# Patient Record
Sex: Female | Born: 1965 | Race: White | Hispanic: No | Marital: Single | Smoking: Former smoker
Health system: Southern US, Community
[De-identification: ages and names within clinical notes are randomized; demographics above are authoritative.]

## PROBLEM LIST (undated history)

## (undated) DIAGNOSIS — I499 Cardiac arrhythmia, unspecified: Secondary | ICD-10-CM

## (undated) DIAGNOSIS — R0600 Dyspnea, unspecified: Secondary | ICD-10-CM

## (undated) DIAGNOSIS — R011 Cardiac murmur, unspecified: Secondary | ICD-10-CM

## (undated) DIAGNOSIS — F909 Attention-deficit hyperactivity disorder, unspecified type: Secondary | ICD-10-CM

## (undated) DIAGNOSIS — F329 Major depressive disorder, single episode, unspecified: Secondary | ICD-10-CM

## (undated) DIAGNOSIS — E079 Disorder of thyroid, unspecified: Secondary | ICD-10-CM

## (undated) DIAGNOSIS — F32A Depression, unspecified: Secondary | ICD-10-CM

## (undated) DIAGNOSIS — M722 Plantar fascial fibromatosis: Secondary | ICD-10-CM

## (undated) DIAGNOSIS — E039 Hypothyroidism, unspecified: Secondary | ICD-10-CM

## (undated) HISTORY — DX: Attention-deficit hyperactivity disorder, unspecified type: F90.9

## (undated) HISTORY — DX: Disorder of thyroid, unspecified: E07.9

## (undated) HISTORY — DX: Depression, unspecified: F32.A

## (undated) HISTORY — PX: COLON SURGERY: SHX602

## (undated) HISTORY — DX: Major depressive disorder, single episode, unspecified: F32.9
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
## (undated) NOTE — *Deleted (*Deleted)
Patient has been very uncomfortable this shift in sizewise bed and has expressed her desire for a new bed and new recliner. The bed was recently changed out as well as the recliner. Patient explained that at this time there are no other options. Patient given morphine twice for pain and zofran twice for nausea. Patient continues to refuse most meds and is teary at times. Frequent repositionings this shift. Chest tube changed to water seal per Maczis, PA at 1030 and then back to suction at 1600 per Dr. Zenaida Niece

---
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
## 2007-05-29 ENCOUNTER — Emergency Department: Payer: Self-pay | Admitting: General Practice

## 2008-07-26 ENCOUNTER — Emergency Department: Payer: Self-pay | Admitting: Emergency Medicine

## 2013-10-28 ENCOUNTER — Ambulatory Visit: Payer: Self-pay | Admitting: Nurse Practitioner

## 2015-12-21 ENCOUNTER — Other Ambulatory Visit: Payer: Self-pay | Admitting: Nurse Practitioner

## 2015-12-21 DIAGNOSIS — Z1231 Encounter for screening mammogram for malignant neoplasm of breast: Secondary | ICD-10-CM

## 2015-12-29 ENCOUNTER — Ambulatory Visit: Payer: Self-pay

## 2016-01-12 ENCOUNTER — Ambulatory Visit
Admission: RE | Admit: 2016-01-12 | Discharge: 2016-01-12 | Disposition: A | Discharge status: Home / Self Care | Payer: Medicaid Other | Source: Ambulatory Visit | Attending: Nurse Practitioner | Admitting: Nurse Practitioner

## 2016-01-12 DIAGNOSIS — Z1231 Encounter for screening mammogram for malignant neoplasm of breast: Secondary | ICD-10-CM | POA: Insufficient documentation

## 2016-01-16 ENCOUNTER — Other Ambulatory Visit: Payer: Self-pay | Admitting: Nurse Practitioner

## 2016-01-16 DIAGNOSIS — N63 Unspecified lump in unspecified breast: Secondary | ICD-10-CM

## 2016-01-30 ENCOUNTER — Other Ambulatory Visit: Payer: Medicaid Other

## 2016-01-30 ENCOUNTER — Ambulatory Visit: Payer: Medicaid Other

## 2016-02-09 ENCOUNTER — Ambulatory Visit
Admission: RE | Admit: 2016-02-09 | Discharge: 2016-02-09 | Disposition: A | Discharge status: Home / Self Care | Payer: Medicaid Other | Source: Ambulatory Visit | Attending: Nurse Practitioner | Admitting: Nurse Practitioner

## 2016-02-09 DIAGNOSIS — N63 Unspecified lump in unspecified breast: Secondary | ICD-10-CM

## 2016-02-09 DIAGNOSIS — R928 Other abnormal and inconclusive findings on diagnostic imaging of breast: Secondary | ICD-10-CM | POA: Insufficient documentation

## 2016-02-12 ENCOUNTER — Other Ambulatory Visit: Payer: Self-pay | Admitting: Nurse Practitioner

## 2016-02-12 DIAGNOSIS — N63 Unspecified lump in unspecified breast: Secondary | ICD-10-CM

## 2016-06-14 ENCOUNTER — Encounter: Payer: Self-pay | Admitting: Nurse Practitioner

## 2016-06-19 ENCOUNTER — Other Ambulatory Visit: Payer: Self-pay | Admitting: Ophthalmology

## 2016-06-19 DIAGNOSIS — G43109 Migraine with aura, not intractable, without status migrainosus: Secondary | ICD-10-CM

## 2016-07-10 ENCOUNTER — Ambulatory Visit: Admission: RE | Admit: 2016-07-10 | Payer: Medicaid Other | Source: Ambulatory Visit

## 2016-08-08 ENCOUNTER — Ambulatory Visit: Payer: Medicaid Other

## 2016-08-20 ENCOUNTER — Ambulatory Visit
Admission: RE | Admit: 2016-08-20 | Discharge: 2016-08-20 | Disposition: A | Discharge status: Home / Self Care | Payer: Medicaid Other | Source: Ambulatory Visit | Attending: Nurse Practitioner | Admitting: Nurse Practitioner

## 2016-08-20 DIAGNOSIS — N63 Unspecified lump in unspecified breast: Secondary | ICD-10-CM

## 2016-08-23 ENCOUNTER — Other Ambulatory Visit: Payer: Self-pay | Admitting: Nurse Practitioner

## 2016-08-23 DIAGNOSIS — N6310 Unspecified lump in the right breast, unspecified quadrant: Secondary | ICD-10-CM

## 2016-08-29 ENCOUNTER — Encounter: Payer: Self-pay | Admitting: Obstetrics and Gynecology

## 2016-09-11 ENCOUNTER — Other Ambulatory Visit: Payer: Medicaid Other

## 2016-09-11 ENCOUNTER — Ambulatory Visit: Payer: Medicaid Other

## 2016-11-12 ENCOUNTER — Encounter: Payer: Self-pay | Admitting: Obstetrics and Gynecology

## 2016-11-12 ENCOUNTER — Ambulatory Visit (INDEPENDENT_AMBULATORY_CARE_PROVIDER_SITE_OTHER): Payer: Medicaid Other | Admitting: Obstetrics and Gynecology

## 2016-11-12 VITALS — BP 118/77 | HR 78 | Ht 64.0 in | Wt 193.0 lb

## 2016-11-12 DIAGNOSIS — N926 Irregular menstruation, unspecified: Secondary | ICD-10-CM

## 2016-11-12 DIAGNOSIS — Z30433 Encounter for removal and reinsertion of intrauterine contraceptive device: Secondary | ICD-10-CM | POA: Diagnosis not present

## 2016-11-12 NOTE — Progress Notes (Signed)
[name] is a 50 y.o. year old 513P0 Caucasian female who presents for removal and replacement of a Mirena IUD. She was given informed consent for removal and reinsertion of her Mirena. Her Mirena was placed 5 years ago, No LMP recorded. Patient is not currently having periods (Reason: IUD)., and her pregnancy test today was NA.   The risks and benefits of the method and placement have been thouroughly reviewed with the patient and all questions were answered.  Specifically the patient is aware of failure rate of 12/998, expulsion of the IUD and of possible perforation.  The patient is aware of irregular bleeding due to the method and understands the incidence of irregular bleeding diminishes with time.  Signed copy of informed consent in chart.   No LMP recorded. Patient is not currently having periods (Reason: IUD). BP 118/77   Pulse 78   Ht 5\' 4"  (1.626 m)   Wt 193 lb (87.5 kg)   BMI 33.13 kg/m  No results found for this or any previous visit (from the past 24 hour(s)).   Appropriate time out taken. A graves speculum was placed in the vagina.  The cervix was visualized, prepped using Betadine. The strings were visible. They were grasped and the Mirena was easily removed. The cervix was then grasped with a single-tooth tenaculum. The uterus was found to be retroflexed and it sounded to 8 cm.  Mirena IUD placed per manufacturer's recommendations without complications. The strings were trimmed to 3 cm.  The patient tolerated the procedure well.  FSH levels obtained to rule out menopause  The patient was given post procedure instructions, including signs and symptoms of infection and to check for the strings after each menses or each month, and refraining from intercourse or anything in the vagina for 3 days.  She was given a Mirena care card with date Mirena placed, and date Mirena to be removed.    Melody Suzan NailerN Shambley, CNM

## 2016-11-12 NOTE — Patient Instructions (Signed)
Menopause Menopause is the normal time of life when menstrual periods stop completely. Menopause is complete when you have missed 12 consecutive menstrual periods. It usually occurs between the ages of 63 years and 78 years. Very rarely does a woman develop menopause before the age of 74 years. At menopause, your ovaries stop producing the female hormones estrogen and progesterone. This can cause undesirable symptoms and also affect your health. Sometimes the symptoms may occur 4-5 years before the menopause begins. There is no relationship between menopause and:  Oral contraceptives.  Number of children you had.  Race.  The age your menstrual periods started (menarche). Heavy smokers and very thin women may develop menopause earlier in life. What are the causes?  The ovaries stop producing the female hormones estrogen and progesterone. Other causes include:  Surgery to remove both ovaries.  The ovaries stop functioning for no known reason.  Tumors of the pituitary gland in the brain.  Medical disease that affects the ovaries and hormone production.  Radiation treatment to the abdomen or pelvis.  Chemotherapy that affects the ovaries. What are the signs or symptoms?  Hot flashes.  Night sweats.  Decrease in sex drive.  Vaginal dryness and thinning of the vagina causing painful intercourse.  Dryness of the skin and developing wrinkles.  Headaches.  Tiredness.  Irritability.  Memory problems.  Weight gain.  Bladder infections.  Hair growth of the face and chest.  Infertility. More serious symptoms include:  Loss of bone (osteoporosis) causing breaks (fractures).  Depression.  Hardening and narrowing of the arteries (atherosclerosis) causing heart attacks and strokes. How is this diagnosed?  When the menstrual periods have stopped for 12 straight months.  Physical exam.  Hormone studies of the blood. How is this treated? There are many treatment  choices and nearly as many questions about them. The decisions to treat or not to treat menopausal changes is an individual choice made with your health care provider. Your health care provider can discuss the treatments with you. Together, you can decide which treatment will work best for you. Your treatment choices may include:  Hormone therapy (estrogen and progesterone).  Non-hormonal medicines.  Treating the individual symptoms with medicine (for example antidepressants for depression).  Herbal medicines that may help specific symptoms.  Counseling by a psychiatrist or psychologist.  Group therapy.  Lifestyle changes including:  Eating healthy.  Regular exercise.  Limiting caffeine and alcohol.  Stress management and meditation.  No treatment. Follow these instructions at home:  Take the medicine your health care provider gives you as directed.  Get plenty of sleep and rest.  Exercise regularly.  Eat a diet that contains calcium (good for the bones) and soy products (acts like estrogen hormone).  Avoid alcoholic beverages.  Do not smoke.  If you have hot flashes, dress in layers.  Take supplements, calcium, and vitamin D to strengthen bones.  You can use over-the-counter lubricants or moisturizers for vaginal dryness.  Group therapy is sometimes very helpful.  Acupuncture may be helpful in some cases. Contact a health care provider if:  You are not sure you are in menopause.  You are having menopausal symptoms and need advice and treatment.  You are still having menstrual periods after age 32 years.  You have pain with intercourse.  Menopause is complete (no menstrual period for 12 months) and you develop vaginal bleeding.  You need a referral to a specialist (gynecologist, psychiatrist, or psychologist) for treatment. Get help right away if:  You  have severe depression.  You have excessive vaginal bleeding.  You fell and think you have a broken  bone.  You have pain when you urinate.  You develop leg or chest pain.  You have a fast pounding heart beat (palpitations).  You have severe headaches.  You develop vision problems.  You feel a lump in your breast.  You have abdominal pain or severe indigestion. This information is not intended to replace advice given to you by your health care provider. Make sure you discuss any questions you have with your health care provider. Document Released: 03/07/2004 Document Revised: 05/23/2016 Document Reviewed: 07/15/2013 Elsevier Interactive Patient Education  2017 [employer] IUD PLACEMENT POST-PROCEDURE INSTRUCTIONS  1. You may take Ibuprofen, Aleve or Tylenol for pain if needed.  Cramping should resolve within in 24 hours.  2. You may have a small amount of spotting.  You should wear a mini pad for the next few days.  3. You may have intercourse after 24 hours.  If you using this for birth control, it is effective immediately.  4. You need to call if you have any pelvic pain, fever, heavy bleeding or foul smelling vaginal discharge.  Irregular bleeding is common the first several months after having an IUD placed. You do not need to call for this reason unless you are concerned.  5. Shower or bathe as normal  6. You should have a follow-up appointment in 4-8 weeks for a re-check to make sure you are not having any problems.

## 2016-11-13 LAB — FOLLICLE STIMULATING HORMONE: FSH: 25.4 m[IU]/mL

## 2016-11-14 ENCOUNTER — Telehealth: Payer: Self-pay | Admitting: *Deleted

## 2016-11-14 NOTE — Telephone Encounter (Signed)
-----   Message from Purcell NailsMelody N Shambley, [place] sent at 11/13/2016  1:16 PM EST ----- Please let her know she is almost in menopause, but not yet. I would repeat her lab in a year.

## 2016-11-14 NOTE — Telephone Encounter (Signed)
Notified pt of results 

## 2016-12-26 ENCOUNTER — Encounter: Payer: Medicaid Other | Admitting: Obstetrics and Gynecology

## 2017-01-03 ENCOUNTER — Ambulatory Visit (INDEPENDENT_AMBULATORY_CARE_PROVIDER_SITE_OTHER): Payer: Medicaid Other | Admitting: Obstetrics and Gynecology

## 2017-01-03 ENCOUNTER — Encounter: Payer: Medicaid Other | Admitting: Obstetrics and Gynecology

## 2017-01-03 ENCOUNTER — Encounter: Payer: Self-pay | Admitting: Obstetrics and Gynecology

## 2017-01-03 VITALS — BP 99/58 | HR 70 | Ht 64.0 in | Wt 191.9 lb

## 2017-01-03 DIAGNOSIS — B373 Candidiasis of vulva and vagina: Secondary | ICD-10-CM

## 2017-01-03 DIAGNOSIS — R29898 Other symptoms and signs involving the musculoskeletal system: Secondary | ICD-10-CM | POA: Diagnosis not present

## 2017-01-03 DIAGNOSIS — B3731 Acute candidiasis of vulva and vagina: Secondary | ICD-10-CM

## 2017-01-03 DIAGNOSIS — Z30431 Encounter for routine checking of intrauterine contraceptive device: Secondary | ICD-10-CM | POA: Diagnosis not present

## 2017-01-03 DIAGNOSIS — L608 Other nail disorders: Secondary | ICD-10-CM

## 2017-01-03 MED ORDER — TERCONAZOLE 0.4 % VA CREA: 1.0000 | TOPICAL_CREAM | Freq: Every day | VAGINAL | 0 refills | Status: DC

## 2017-01-04 LAB — BASIC METABOLIC PANEL
BUN / CREAT RATIO: 21 (ref 9–23)
BUN: 16 mg/dL (ref 6–24)
CHLORIDE: 104 mmol/L (ref 96–106)
CO2: 24 mmol/L (ref 18–29)
Calcium: 9.1 mg/dL (ref 8.7–10.2)
Creatinine, Ser: 0.76 mg/dL (ref 0.57–1.00)
GFR calc Af Amer: 107 mL/min/{1.73_m2} (ref >59)
GFR calc non Af Amer: 93 mL/min/{1.73_m2} (ref >59)
GLUCOSE: 84 mg/dL (ref 65–99)
Potassium: 4 mmol/L (ref 3.5–5.2)
SODIUM: 144 mmol/L (ref 134–144)

## 2017-01-04 LAB — VITAMIN B12: VITAMIN B 12: 789 pg/mL (ref 232–1245)

## 2017-01-04 LAB — VITAMIN D 25 HYDROXY (VIT D DEFICIENCY, FRACTURES): Vit D, 25-Hydroxy: 51.1 ng/mL (ref 30.0–100.0)

## 2017-02-06 NOTE — Progress Notes (Signed)
Subjective:     Patient ID: Sara [name], female   DOB: December 15, 1966, 51 y.o.   MRN: [id number]  HPI Here for IUD check, replaced Mirena on 11/12/16. Reports irregular spotting since insertion, but more concerned with vaginal itching and irritation with increased white d/c  also concerned as nails are thinner and fragile. Review of Systems Negative except stated in HPI    Objective:   Physical Exam A&O x4 Well groomed female in no distress Blood pressure (!) 99/58, pulse 70, height 5\' 4"  (1.626 m), weight 191 lb 14.4 oz (87 kg). Pelvic exam: normal external genitalia, vulva, vagina, cervix, uterus and adnexa, WET MOUNT done - results: negative for pathogens, normal epithelial cells, lactobacilli, IUD string noted. Increased white discharge. Nails appear normal     Assessment:     IUD check Yeast infection     Plan:    labs obtained. meds sent in to treat yeast infection Desires to continue with IUD use. RTC as needed.  Zilla Shartzer [place] Lake[place], CNM

## 2017-02-19 ENCOUNTER — Ambulatory Visit
Admission: RE | Admit: 2017-02-19 | Discharge: 2017-02-19 | Disposition: A | Discharge status: Home / Self Care | Payer: Medicaid Other | Source: Ambulatory Visit | Attending: Nurse Practitioner | Admitting: Nurse Practitioner

## 2017-02-19 DIAGNOSIS — N6310 Unspecified lump in the right breast, unspecified quadrant: Secondary | ICD-10-CM | POA: Diagnosis present

## 2017-12-24 ENCOUNTER — Other Ambulatory Visit: Payer: Self-pay | Admitting: Nurse Practitioner

## 2017-12-24 DIAGNOSIS — R922 Inconclusive mammogram: Secondary | ICD-10-CM

## 2018-02-20 ENCOUNTER — Other Ambulatory Visit: Payer: Self-pay

## 2020-02-24 ENCOUNTER — Other Ambulatory Visit: Payer: Self-pay

## 2020-02-24 ENCOUNTER — Ambulatory Visit: Payer: Medicaid Other | Admitting: Podiatry

## 2020-02-24 DIAGNOSIS — M7751 Other enthesopathy of right foot: Secondary | ICD-10-CM | POA: Diagnosis not present

## 2020-02-24 DIAGNOSIS — M778 Other enthesopathies, not elsewhere classified: Secondary | ICD-10-CM

## 2020-02-24 DIAGNOSIS — M7752 Other enthesopathy of left foot: Secondary | ICD-10-CM

## 2020-02-24 DIAGNOSIS — L600 Ingrowing nail: Secondary | ICD-10-CM

## 2020-02-29 ENCOUNTER — Encounter: Payer: Self-pay | Admitting: Podiatry

## 2020-02-29 NOTE — Progress Notes (Signed)
Subjective:  Patient ID: Sara [name], female    DOB: 19-Oct-1966,  MRN: [id number]  Chief Complaint  Patient presents with  . Callouses    pt is here for bil callous pain, primarily located on the plantar forefoot of both feet, as well as the lateral side of both feet.     54 y.o. female presents with the above complaint.  Patient presents with bilateral lateral ingrown of the hallux that has been causing her a lot of pain.  Patient states that it is painful to ambulate on.  Patient states that the pain is worsened when she is ambulating.  There is excessive pressure on the dorsal aspect of the toes that may have caused it to become ingrown.  She would like to know if this could be removed.  She also has secondary complaint of bilateral porokeratosis/benign skin lesion.  She states that this has also been causing her a lot of pain especially when ambulating.  She denies any other acute complaints.   Review of Systems: Negative except as noted in the HPI. Denies N/V/F/Ch.  Past Medical History:  Diagnosis Date  . ADHD   . Depression   . Thyroid disease     Current Outpatient Medications:  .  atomoxetine (STRATTERA) 100 MG capsule, Take 100 mg by mouth daily., Disp: , Rfl:  .  Biotin 10 MG CAPS, Take by mouth., Disp: , Rfl:  .  levothyroxine (SYNTHROID, LEVOTHROID) 75 MCG tablet, Take 75 mcg by mouth daily before breakfast., Disp: , Rfl:  .  terconazole (TERAZOL 7) 0.4 % vaginal cream, Place 1 applicator vaginally at bedtime., Disp: 45 g, Rfl: 0 .  Vitamin D, Ergocalciferol, (DRISDOL) 50000 units CAPS capsule, Take 50,000 Units by mouth every 7 (seven) days., Disp: , Rfl:  .  vortioxetine HBr (TRINTELLIX) 10 MG TABS, Take by mouth., Disp: , Rfl:   Social History   Tobacco Use  Smoking Status Never Smoker  Smokeless Tobacco Never Used    Allergies  Allergen Reactions  . Augmentin [Amoxicillin-Pot Clavulanate]   . Ciprofloxacin   . Neosporin [Neomycin-Bacitracin Zn-Polymyx]     . Septra [Sulfamethoxazole-Trimethoprim]   . Zithromax [Azithromycin]    Objective:  There were no vitals filed for this visit. There is no height or weight on file to calculate BMI. Constitutional Well developed. Well nourished.  Vascular Dorsalis pedis pulses palpable bilaterally. Posterior tibial pulses palpable bilaterally. Capillary refill normal to all digits.  No cyanosis or clubbing noted. Pedal hair growth normal.  Neurologic Normal speech. Oriented to person, place, and time. Epicritic sensation to light touch grossly present bilaterally.  Dermatologic Painful ingrowing nail at lateral nail borders of the hallux nail bilaterally. No other open wounds. Benign skin lotion noted on the plantar aspect of fifth submetatarsal.  Pain on palpation noted.  Upon debridement no pinpoint bleeding noted.  Orthopedic: Normal joint ROM without pain or crepitus bilaterally. No visible deformities. No bony tenderness.   Radiographs: None Assessment:   1. Ingrown toenail of right foot   2. Ingrown left big toenail   3. Capsulitis of foot, right   4. Capsulitis of left foot    Plan:  Patient was evaluated and treated and all questions answered.  Ingrown Nail, bilaterally -Patient elects to proceed with minor surgery to remove ingrown toenail removal today. Consent reviewed and signed by patient. -Ingrown nail excised. See procedure note. -Educated on post-procedure care including soaking. Written instructions provided and reviewed. -Patient to follow up in 4 weeks  for nail check.   Bilateral submetatarsal 5 benign skin lesion/capsulitis -I explained to the patient the etiology of skin lesion and various treatment options associated with it.  I believe patient will benefit from a steroid injection to help address the acute inflammation due to excessive pressure.  Patient agrees with the plan would like to proceed with a steroid injection to help decrease the acute inflammatory  portion. -A steroid injection was performed at bilateral submetatarsal 5 using 1% plain Lidocaine and 10 mg of Kenalog. This was well tolerated.   Procedure: Excision of Ingrown Toenail Location: Bilateral 1st toe medial nail borders. Anesthesia: Lidocaine 1% plain; 1.5 mL and Marcaine 0.5% plain; 1.5 mL, digital block. Skin Prep: Betadine. Dressing: Silvadene; telfa; dry, sterile, compression dressing. Technique: Following skin prep, the toe was exsanguinated and a tourniquet was secured at the base of the toe. The affected nail border was freed, split with a nail splitter, and excised. Chemical matrixectomy was then performed with phenol and irrigated out with alcohol. The tourniquet was then removed and sterile dressing applied. Disposition: Patient tolerated procedure well. Patient to return in 2 weeks for follow-up.   No follow-ups on file.

## 2020-03-23 ENCOUNTER — Ambulatory Visit (INDEPENDENT_AMBULATORY_CARE_PROVIDER_SITE_OTHER): Payer: Medicaid Other | Admitting: Podiatry

## 2020-03-23 ENCOUNTER — Encounter: Payer: Self-pay | Admitting: Podiatry

## 2020-03-23 ENCOUNTER — Other Ambulatory Visit: Payer: Self-pay

## 2020-03-23 DIAGNOSIS — L600 Ingrowing nail: Secondary | ICD-10-CM

## 2020-03-24 ENCOUNTER — Encounter: Payer: Self-pay | Admitting: Podiatry

## 2020-03-24 NOTE — Progress Notes (Signed)
Subjective: Sara [name] is a 54 y.o.  female returns to office today for follow up evaluation after having bilateral Hallux Medial border nail avulsion performed. Patient has been soaking using epsom salt and applying topical antibiotic covered with bandaid daily. Patient denies fevers, chills, nausea, vomiting. Denies any calf pain, chest pain, SOB.   Objective:  Vitals: Reviewed  General: Well developed, nourished, in no acute distress, alert and oriented x3   Dermatology: Skin is warm, dry and supple bilateral. Medial hallux nail border appears to be clean, dry, with mild granular tissue and surrounding scab. There is no surrounding erythema, edema, drainage/purulence. The remaining nails appear unremarkable at this time. There are no other lesions or other signs of infection present.  Neurovascular status: Intact. No lower extremity swelling; No pain with calf compression bilateral.  Musculoskeletal: Decreased tenderness to palpation of the Medial hallux nail fold(s). Muscular strength within normal limits bilateral.   Assesement and Plan: S/p partial nail avulsion, doing well.   -Continue soaking in epsom salts twice a day followed by antibiotic ointment and a band-aid. Can leave uncovered at night. Continue this until completely healed.  -If the area has not healed in 2 weeks, call the office for follow-up appointment, or sooner if any problems arise.  -Monitor for any signs/symptoms of infection. Call the office immediately if any occur or go directly to the emergency room. Call with any questions/concerns.  Nicholes Rough, DPM

## 2020-06-29 DIAGNOSIS — Z419 Encounter for procedure for purposes other than remedying health state, unspecified: Secondary | ICD-10-CM | POA: Diagnosis not present

## 2020-07-30 DIAGNOSIS — Z419 Encounter for procedure for purposes other than remedying health state, unspecified: Secondary | ICD-10-CM | POA: Diagnosis not present

## 2020-08-17 ENCOUNTER — Ambulatory Visit (INDEPENDENT_AMBULATORY_CARE_PROVIDER_SITE_OTHER): Payer: Medicaid Other | Admitting: Podiatry

## 2020-08-17 ENCOUNTER — Other Ambulatory Visit: Payer: Self-pay

## 2020-08-17 ENCOUNTER — Ambulatory Visit (INDEPENDENT_AMBULATORY_CARE_PROVIDER_SITE_OTHER): Payer: Medicaid Other

## 2020-08-17 ENCOUNTER — Encounter: Payer: Self-pay | Admitting: Podiatry

## 2020-08-17 DIAGNOSIS — M722 Plantar fascial fibromatosis: Secondary | ICD-10-CM

## 2020-08-17 DIAGNOSIS — M216X1 Other acquired deformities of right foot: Secondary | ICD-10-CM

## 2020-08-17 DIAGNOSIS — M7730 Calcaneal spur, unspecified foot: Secondary | ICD-10-CM | POA: Diagnosis not present

## 2020-08-18 ENCOUNTER — Encounter: Payer: Self-pay | Admitting: Podiatry

## 2020-08-18 DIAGNOSIS — M79662 Pain in left lower leg: Secondary | ICD-10-CM | POA: Diagnosis not present

## 2020-08-18 DIAGNOSIS — M7710 Lateral epicondylitis, unspecified elbow: Secondary | ICD-10-CM | POA: Diagnosis not present

## 2020-08-18 DIAGNOSIS — R010 Benign and innocent cardiac murmurs: Secondary | ICD-10-CM | POA: Diagnosis not present

## 2020-08-18 DIAGNOSIS — R011 Cardiac murmur, unspecified: Secondary | ICD-10-CM | POA: Diagnosis not present

## 2020-08-18 DIAGNOSIS — R5383 Other fatigue: Secondary | ICD-10-CM | POA: Diagnosis not present

## 2020-08-18 DIAGNOSIS — R7301 Impaired fasting glucose: Secondary | ICD-10-CM | POA: Diagnosis not present

## 2020-08-18 DIAGNOSIS — I10 Essential (primary) hypertension: Secondary | ICD-10-CM | POA: Diagnosis not present

## 2020-08-18 DIAGNOSIS — M79661 Pain in right lower leg: Secondary | ICD-10-CM | POA: Diagnosis not present

## 2020-08-18 DIAGNOSIS — E785 Hyperlipidemia, unspecified: Secondary | ICD-10-CM | POA: Diagnosis not present

## 2020-08-18 DIAGNOSIS — G4452 New daily persistent headache (NDPH): Secondary | ICD-10-CM | POA: Diagnosis not present

## 2020-08-18 DIAGNOSIS — E559 Vitamin D deficiency, unspecified: Secondary | ICD-10-CM | POA: Diagnosis not present

## 2020-08-18 NOTE — Progress Notes (Signed)
Subjective:  Patient ID: Sara [name], female    DOB: [birth year],  MRN: [id number]  Chief Complaint  Patient presents with  . Callouses    Patient comes in today for callous trim and bilat heel pain x 2-3 weeks, left worse than right  . Foot Pain    54 y.o. female presents with the above complaint.  Patient presents with bilateral heel pain that has been going on for 2 to 3 weeks has gotten very painful.  Is painful to stand up especially in the morning getting out of bed.  Patient experienced post static dyskinesia type of symptoms.  Patient has tried Tylenol and CBD balm but has not helped.  She would like to discuss treatment options for the heel pain.  She states that she has not had plantar fasciitis in the past.  She has not seen anyone else for this.  She denies any other acute complaints.   Review of Systems: Negative except as noted in the HPI. Denies N/V/F/Ch.  Past Medical History:  Diagnosis Date  . ADHD   . Depression   . Thyroid disease     Current Outpatient Medications:  .  atomoxetine (STRATTERA) 100 MG capsule, Take 100 mg by mouth daily., Disp: , Rfl:  .  Biotin 10 MG CAPS, Take by mouth., Disp: , Rfl:  .  levothyroxine (SYNTHROID, LEVOTHROID) 75 MCG tablet, Take 75 mcg by mouth daily before breakfast., Disp: , Rfl:  .  terconazole (TERAZOL 7) 0.4 % vaginal cream, Place 1 applicator vaginally at bedtime., Disp: 45 g, Rfl: 0 .  Vitamin D, Ergocalciferol, (DRISDOL) 50000 units CAPS capsule, Take 50,000 Units by mouth every 7 (seven) days., Disp: , Rfl:  .  vortioxetine HBr (TRINTELLIX) 10 MG TABS, Take by mouth., Disp: , Rfl:   Social History   Tobacco Use  Smoking Status Never Smoker  Smokeless Tobacco Never Used    Allergies  Allergen Reactions  . Augmentin [Amoxicillin-Pot Clavulanate]   . Ciprofloxacin   . Neosporin [Neomycin-Bacitracin Zn-Polymyx]   . Septra [Sulfamethoxazole-Trimethoprim]   . Zithromax [Azithromycin]    Objective:  There were  no vitals filed for this visit. There is no height or weight on file to calculate BMI. Constitutional Well developed. Well nourished.  Vascular Dorsalis pedis pulses palpable bilaterally. Posterior tibial pulses palpable bilaterally. Capillary refill normal to all digits.  No cyanosis or clubbing noted. Pedal hair growth normal.  Neurologic Normal speech. Oriented to person, place, and time. Epicritic sensation to light touch grossly present bilaterally.  Dermatologic Nails well groomed and normal in appearance. No open wounds. No skin lesions.  Orthopedic: Normal joint ROM without pain or crepitus bilaterally. No visible deformities. Tender to palpation at the calcaneal tuber bilaterally. No pain with calcaneal squeeze bilaterally. Ankle ROM diminished range of motion bilaterally. Silfverskiold Test: positive bilaterally.   Radiographs: Taken and reviewed. No acute fractures or dislocations. No evidence of stress fracture.  Plantar heel spur present. Posterior heel spur present.   Assessment:   1. Plantar fasciitis of right foot   2. Plantar fasciitis of left foot   3. Heel spur, unspecified laterality    Plan:  Patient was evaluated and treated and all questions answered.  Plantar Fasciitis, bilaterally with underlying heel spur - XR reviewed as above.  - Educated on icing and stretching. Instructions given.  - Injection delivered to the plantar fascia as below. - DME: Plantar Fascial Brace - Pharmacologic management: None  Procedure: Injection Tendon/Ligament Location: Bilateral plantar fascia  at the glabrous junction; medial approach. Skin Prep: alcohol Injectate: 0.5 cc 0.5% marcaine plain, 0.5 cc of 1% Lidocaine, 0.5 cc kenalog 10. Disposition: Patient tolerated procedure well. Injection site dressed with a band-aid.  No follow-ups on file.

## 2020-08-29 DIAGNOSIS — M79661 Pain in right lower leg: Secondary | ICD-10-CM | POA: Diagnosis not present

## 2020-08-29 DIAGNOSIS — R5383 Other fatigue: Secondary | ICD-10-CM | POA: Diagnosis not present

## 2020-08-29 DIAGNOSIS — R7301 Impaired fasting glucose: Secondary | ICD-10-CM | POA: Diagnosis not present

## 2020-08-29 DIAGNOSIS — I10 Essential (primary) hypertension: Secondary | ICD-10-CM | POA: Diagnosis not present

## 2020-08-29 DIAGNOSIS — G4452 New daily persistent headache (NDPH): Secondary | ICD-10-CM | POA: Diagnosis not present

## 2020-08-29 DIAGNOSIS — R011 Cardiac murmur, unspecified: Secondary | ICD-10-CM | POA: Diagnosis not present

## 2020-08-29 DIAGNOSIS — E785 Hyperlipidemia, unspecified: Secondary | ICD-10-CM | POA: Diagnosis not present

## 2020-08-29 DIAGNOSIS — E559 Vitamin D deficiency, unspecified: Secondary | ICD-10-CM | POA: Diagnosis not present

## 2020-08-30 DIAGNOSIS — Z419 Encounter for procedure for purposes other than remedying health state, unspecified: Secondary | ICD-10-CM | POA: Diagnosis not present

## 2020-09-13 DIAGNOSIS — E785 Hyperlipidemia, unspecified: Secondary | ICD-10-CM | POA: Diagnosis not present

## 2020-09-13 DIAGNOSIS — I361 Nonrheumatic tricuspid (valve) insufficiency: Secondary | ICD-10-CM | POA: Diagnosis not present

## 2020-09-13 DIAGNOSIS — I509 Heart failure, unspecified: Secondary | ICD-10-CM | POA: Diagnosis not present

## 2020-09-13 DIAGNOSIS — E038 Other specified hypothyroidism: Secondary | ICD-10-CM | POA: Diagnosis not present

## 2020-09-13 DIAGNOSIS — I340 Nonrheumatic mitral (valve) insufficiency: Secondary | ICD-10-CM | POA: Diagnosis not present

## 2020-09-14 ENCOUNTER — Other Ambulatory Visit: Payer: Self-pay | Admitting: Nurse Practitioner

## 2020-09-14 DIAGNOSIS — N6001 Solitary cyst of right breast: Secondary | ICD-10-CM

## 2020-09-18 ENCOUNTER — Other Ambulatory Visit: Payer: Self-pay

## 2020-09-18 ENCOUNTER — Encounter: Payer: Self-pay | Admitting: Podiatry

## 2020-09-18 ENCOUNTER — Ambulatory Visit (INDEPENDENT_AMBULATORY_CARE_PROVIDER_SITE_OTHER): Payer: Medicaid Other | Admitting: Podiatry

## 2020-09-18 DIAGNOSIS — M722 Plantar fascial fibromatosis: Secondary | ICD-10-CM

## 2020-09-18 DIAGNOSIS — M7730 Calcaneal spur, unspecified foot: Secondary | ICD-10-CM | POA: Diagnosis not present

## 2020-09-19 ENCOUNTER — Encounter: Payer: Self-pay | Admitting: Podiatry

## 2020-09-19 NOTE — Progress Notes (Signed)
  Subjective:  Patient ID: Sara [name], female    DOB: 08/31/1966,  MRN: [id number]  Chief Complaint  Patient presents with  . Plantar Fasciitis    "my right foot is better, but my left foot has started hurting again and my ankle is swelling again"    54 y.o. female presents with the above complaint.  Patient presents with bilateral plantar fasciitis follow-up.  Patient states she is doing about 50% better.  The right side is worse on the left side the left side has improved considerably.  She wants to discuss future treatment options she denies any other acute complaints.   Review of Systems: Negative except as noted in the HPI. Denies N/V/F/Ch.  Past Medical History:  Diagnosis Date  . ADHD   . Depression   . Thyroid disease     Current Outpatient Medications:  .  atomoxetine (STRATTERA) 100 MG capsule, Take 100 mg by mouth daily., Disp: , Rfl:  .  Biotin 10 MG CAPS, Take by mouth., Disp: , Rfl:  .  levothyroxine (SYNTHROID, LEVOTHROID) 75 MCG tablet, Take 75 mcg by mouth daily before breakfast., Disp: , Rfl:  .  terconazole (TERAZOL 7) 0.4 % vaginal cream, Place 1 applicator vaginally at bedtime., Disp: 45 g, Rfl: 0 .  Vitamin D, Ergocalciferol, (DRISDOL) 50000 units CAPS capsule, Take 50,000 Units by mouth every 7 (seven) days., Disp: , Rfl:  .  vortioxetine HBr (TRINTELLIX) 10 MG TABS, Take by mouth., Disp: , Rfl:   Social History   Tobacco Use  Smoking Status Never Smoker  Smokeless Tobacco Never Used    Allergies  Allergen Reactions  . Augmentin [Amoxicillin-Pot Clavulanate]   . Ciprofloxacin   . Neosporin [Neomycin-Bacitracin Zn-Polymyx]   . Septra [Sulfamethoxazole-Trimethoprim]   . Zithromax [Azithromycin]    Objective:  There were no vitals filed for this visit. There is no height or weight on file to calculate BMI. Constitutional Well developed. Well nourished.  Vascular Dorsalis pedis pulses palpable bilaterally. Posterior tibial pulses palpable  bilaterally. Capillary refill normal to all digits.  No cyanosis or clubbing noted. Pedal hair growth normal.  Neurologic Normal speech. Oriented to person, place, and time. Epicritic sensation to light touch grossly present bilaterally.  Dermatologic Nails well groomed and normal in appearance. No open wounds. No skin lesions.  Orthopedic: Normal joint ROM without pain or crepitus bilaterally. No visible deformities. Tender to palpation at the calcaneal tuber bilaterally. No pain with calcaneal squeeze bilaterally. Ankle ROM diminished range of motion bilaterally. Silfverskiold Test: positive bilaterally.   Radiographs: Taken and reviewed. No acute fractures or dislocations. No evidence of stress fracture.  Plantar heel spur present. Posterior heel spur present.   Assessment:   1. Plantar fasciitis of left foot   2. Plantar fasciitis of right foot   3. Heel spur, unspecified laterality    Plan:  Patient was evaluated and treated and all questions answered.  Plantar Fasciitis, bilaterally with underlying heel spur - XR reviewed as above.  - Educated on icing and stretching. Instructions given.  - Injection delivered to the plantar fascia as below. - DME: Continue wearing plantar fascial brace x2  - Pharmacologic management: None  Procedure: Injection Tendon/Ligament Location: Bilateral plantar fascia at the glabrous junction; medial approach. Skin Prep: alcohol Injectate: 0.5 cc 0.5% marcaine plain, 0.5 cc of 1% Lidocaine, 0.5 cc kenalog 10. Disposition: Patient tolerated procedure well. Injection site dressed with a band-aid.  No follow-ups on file.

## 2020-09-22 ENCOUNTER — Inpatient Hospital Stay (HOSPITAL_COMMUNITY): Payer: Medicaid Other | Admitting: Anesthesiology

## 2020-09-22 ENCOUNTER — Emergency Department (HOSPITAL_COMMUNITY): Payer: Medicaid Other

## 2020-09-22 ENCOUNTER — Inpatient Hospital Stay (HOSPITAL_COMMUNITY): Payer: Medicaid Other

## 2020-09-22 ENCOUNTER — Inpatient Hospital Stay (HOSPITAL_COMMUNITY)
Admission: EM | Admit: 2020-09-22 | Discharge: 2020-12-14 | DRG: 003 | Disposition: A | Discharge status: Rehabilitation Facility | Payer: Medicaid Other | Attending: Surgery | Admitting: Surgery

## 2020-09-22 ENCOUNTER — Encounter (HOSPITAL_COMMUNITY): Admission: EM | Disposition: A | Discharge status: Rehabilitation Facility | Payer: Self-pay | Source: Home / Self Care

## 2020-09-22 DIAGNOSIS — L02211 Cutaneous abscess of abdominal wall: Secondary | ICD-10-CM | POA: Diagnosis not present

## 2020-09-22 DIAGNOSIS — R609 Edema, unspecified: Secondary | ICD-10-CM | POA: Diagnosis not present

## 2020-09-22 DIAGNOSIS — Z6837 Body mass index (BMI) 37.0-37.9, adult: Secondary | ICD-10-CM

## 2020-09-22 DIAGNOSIS — J90 Pleural effusion, not elsewhere classified: Secondary | ICD-10-CM | POA: Diagnosis not present

## 2020-09-22 DIAGNOSIS — D689 Coagulation defect, unspecified: Secondary | ICD-10-CM | POA: Diagnosis not present

## 2020-09-22 DIAGNOSIS — S82142A Displaced bicondylar fracture of left tibia, initial encounter for closed fracture: Secondary | ICD-10-CM | POA: Diagnosis not present

## 2020-09-22 DIAGNOSIS — K659 Peritonitis, unspecified: Secondary | ICD-10-CM | POA: Diagnosis not present

## 2020-09-22 DIAGNOSIS — G7281 Critical illness myopathy: Secondary | ICD-10-CM | POA: Diagnosis not present

## 2020-09-22 DIAGNOSIS — R402362 Coma scale, best motor response, obeys commands, at arrival to emergency department: Secondary | ICD-10-CM | POA: Diagnosis present

## 2020-09-22 DIAGNOSIS — J869 Pyothorax without fistula: Secondary | ICD-10-CM | POA: Diagnosis not present

## 2020-09-22 DIAGNOSIS — S52501A Unspecified fracture of the lower end of right radius, initial encounter for closed fracture: Secondary | ICD-10-CM | POA: Diagnosis present

## 2020-09-22 DIAGNOSIS — S92251A Displaced fracture of navicular [scaphoid] of right foot, initial encounter for closed fracture: Secondary | ICD-10-CM | POA: Diagnosis present

## 2020-09-22 DIAGNOSIS — S7292XE Unspecified fracture of left femur, subsequent encounter for open fracture type I or II with routine healing: Secondary | ICD-10-CM | POA: Diagnosis not present

## 2020-09-22 DIAGNOSIS — S27322A Contusion of lung, bilateral, initial encounter: Secondary | ICD-10-CM | POA: Diagnosis present

## 2020-09-22 DIAGNOSIS — M868X8 Other osteomyelitis, other site: Secondary | ICD-10-CM | POA: Diagnosis not present

## 2020-09-22 DIAGNOSIS — Z01818 Encounter for other preprocedural examination: Secondary | ICD-10-CM

## 2020-09-22 DIAGNOSIS — Z419 Encounter for procedure for purposes other than remedying health state, unspecified: Secondary | ICD-10-CM | POA: Diagnosis not present

## 2020-09-22 DIAGNOSIS — S31109A Unspecified open wound of abdominal wall, unspecified quadrant without penetration into peritoneal cavity, initial encounter: Secondary | ICD-10-CM | POA: Diagnosis not present

## 2020-09-22 DIAGNOSIS — G8918 Other acute postprocedural pain: Secondary | ICD-10-CM | POA: Diagnosis not present

## 2020-09-22 DIAGNOSIS — S3981XA Other specified injuries of abdomen, initial encounter: Secondary | ICD-10-CM | POA: Diagnosis present

## 2020-09-22 DIAGNOSIS — Z9689 Presence of other specified functional implants: Secondary | ICD-10-CM

## 2020-09-22 DIAGNOSIS — M7989 Other specified soft tissue disorders: Secondary | ICD-10-CM | POA: Diagnosis not present

## 2020-09-22 DIAGNOSIS — Z515 Encounter for palliative care: Secondary | ICD-10-CM | POA: Diagnosis not present

## 2020-09-22 DIAGNOSIS — Z23 Encounter for immunization: Secondary | ICD-10-CM

## 2020-09-22 DIAGNOSIS — I959 Hypotension, unspecified: Secondary | ICD-10-CM | POA: Diagnosis not present

## 2020-09-22 DIAGNOSIS — E559 Vitamin D deficiency, unspecified: Secondary | ICD-10-CM | POA: Diagnosis present

## 2020-09-22 DIAGNOSIS — S22019A Unspecified fracture of first thoracic vertebra, initial encounter for closed fracture: Secondary | ICD-10-CM | POA: Diagnosis present

## 2020-09-22 DIAGNOSIS — S32019A Unspecified fracture of first lumbar vertebra, initial encounter for closed fracture: Secondary | ICD-10-CM | POA: Diagnosis present

## 2020-09-22 DIAGNOSIS — B9561 Methicillin susceptible Staphylococcus aureus infection as the cause of diseases classified elsewhere: Secondary | ICD-10-CM | POA: Diagnosis not present

## 2020-09-22 DIAGNOSIS — R402142 Coma scale, eyes open, spontaneous, at arrival to emergency department: Secondary | ICD-10-CM | POA: Diagnosis present

## 2020-09-22 DIAGNOSIS — T07XXXA Unspecified multiple injuries, initial encounter: Secondary | ICD-10-CM | POA: Diagnosis present

## 2020-09-22 DIAGNOSIS — S2243XA Multiple fractures of ribs, bilateral, initial encounter for closed fracture: Secondary | ICD-10-CM | POA: Diagnosis present

## 2020-09-22 DIAGNOSIS — Z87891 Personal history of nicotine dependence: Secondary | ICD-10-CM

## 2020-09-22 DIAGNOSIS — B3789 Other sites of candidiasis: Secondary | ICD-10-CM | POA: Diagnosis not present

## 2020-09-22 DIAGNOSIS — R600 Localized edema: Secondary | ICD-10-CM | POA: Diagnosis not present

## 2020-09-22 DIAGNOSIS — J439 Emphysema, unspecified: Secondary | ICD-10-CM

## 2020-09-22 DIAGNOSIS — R402252 Coma scale, best verbal response, oriented, at arrival to emergency department: Secondary | ICD-10-CM | POA: Diagnosis present

## 2020-09-22 DIAGNOSIS — Z09 Encounter for follow-up examination after completed treatment for conditions other than malignant neoplasm: Secondary | ICD-10-CM

## 2020-09-22 DIAGNOSIS — T1490XA Injury, unspecified, initial encounter: Secondary | ICD-10-CM

## 2020-09-22 DIAGNOSIS — S72462C Displaced supracondylar fracture with intracondylar extension of lower end of left femur, initial encounter for open fracture type IIIA, IIIB, or IIIC: Secondary | ICD-10-CM | POA: Diagnosis not present

## 2020-09-22 DIAGNOSIS — T81509A Unspecified complication of foreign body accidentally left in body following unspecified procedure, initial encounter: Secondary | ICD-10-CM

## 2020-09-22 DIAGNOSIS — S82002A Unspecified fracture of left patella, initial encounter for closed fracture: Secondary | ICD-10-CM | POA: Diagnosis present

## 2020-09-22 DIAGNOSIS — S72302S Unspecified fracture of shaft of left femur, sequela: Secondary | ICD-10-CM | POA: Diagnosis not present

## 2020-09-22 DIAGNOSIS — R339 Retention of urine, unspecified: Secondary | ICD-10-CM | POA: Diagnosis not present

## 2020-09-22 DIAGNOSIS — Z20822 Contact with and (suspected) exposure to covid-19: Secondary | ICD-10-CM | POA: Diagnosis present

## 2020-09-22 DIAGNOSIS — E870 Hyperosmolality and hypernatremia: Secondary | ICD-10-CM | POA: Diagnosis not present

## 2020-09-22 DIAGNOSIS — R52 Pain, unspecified: Secondary | ICD-10-CM | POA: Diagnosis not present

## 2020-09-22 DIAGNOSIS — M545 Low back pain, unspecified: Secondary | ICD-10-CM | POA: Diagnosis not present

## 2020-09-22 DIAGNOSIS — I9589 Other hypotension: Secondary | ICD-10-CM | POA: Diagnosis present

## 2020-09-22 DIAGNOSIS — R112 Nausea with vomiting, unspecified: Secondary | ICD-10-CM

## 2020-09-22 DIAGNOSIS — S36498A Other injury of other part of small intestine, initial encounter: Secondary | ICD-10-CM | POA: Diagnosis not present

## 2020-09-22 DIAGNOSIS — S92101A Unspecified fracture of right talus, initial encounter for closed fracture: Secondary | ICD-10-CM | POA: Diagnosis present

## 2020-09-22 DIAGNOSIS — S270XXA Traumatic pneumothorax, initial encounter: Secondary | ICD-10-CM | POA: Diagnosis present

## 2020-09-22 DIAGNOSIS — E162 Hypoglycemia, unspecified: Secondary | ICD-10-CM | POA: Diagnosis not present

## 2020-09-22 DIAGNOSIS — S36590A Other injury of ascending [right] colon, initial encounter: Secondary | ICD-10-CM | POA: Diagnosis not present

## 2020-09-22 DIAGNOSIS — S2249XA Multiple fractures of ribs, unspecified side, initial encounter for closed fracture: Secondary | ICD-10-CM

## 2020-09-22 DIAGNOSIS — E876 Hypokalemia: Secondary | ICD-10-CM | POA: Diagnosis not present

## 2020-09-22 DIAGNOSIS — S62113A Displaced fracture of triquetrum [cuneiform] bone, unspecified wrist, initial encounter for closed fracture: Secondary | ICD-10-CM | POA: Diagnosis present

## 2020-09-22 DIAGNOSIS — Z930 Tracheostomy status: Secondary | ICD-10-CM

## 2020-09-22 DIAGNOSIS — K439 Ventral hernia without obstruction or gangrene: Secondary | ICD-10-CM | POA: Diagnosis present

## 2020-09-22 DIAGNOSIS — Y92410 Unspecified street and highway as the place of occurrence of the external cause: Secondary | ICD-10-CM

## 2020-09-22 DIAGNOSIS — D72829 Elevated white blood cell count, unspecified: Secondary | ICD-10-CM | POA: Diagnosis not present

## 2020-09-22 DIAGNOSIS — S32029A Unspecified fracture of second lumbar vertebra, initial encounter for closed fracture: Secondary | ICD-10-CM | POA: Diagnosis present

## 2020-09-22 DIAGNOSIS — Z978 Presence of other specified devices: Secondary | ICD-10-CM

## 2020-09-22 DIAGNOSIS — R064 Hyperventilation: Secondary | ICD-10-CM

## 2020-09-22 DIAGNOSIS — M24562 Contracture, left knee: Secondary | ICD-10-CM | POA: Diagnosis not present

## 2020-09-22 DIAGNOSIS — K449 Diaphragmatic hernia without obstruction or gangrene: Secondary | ICD-10-CM | POA: Diagnosis not present

## 2020-09-22 DIAGNOSIS — L02213 Cutaneous abscess of chest wall: Secondary | ICD-10-CM | POA: Diagnosis not present

## 2020-09-22 DIAGNOSIS — Z803 Family history of malignant neoplasm of breast: Secondary | ICD-10-CM

## 2020-09-22 DIAGNOSIS — E872 Acidosis: Secondary | ICD-10-CM | POA: Diagnosis not present

## 2020-09-22 DIAGNOSIS — J80 Acute respiratory distress syndrome: Secondary | ICD-10-CM | POA: Diagnosis not present

## 2020-09-22 DIAGNOSIS — M869 Osteomyelitis, unspecified: Secondary | ICD-10-CM

## 2020-09-22 DIAGNOSIS — N179 Acute kidney failure, unspecified: Secondary | ICD-10-CM | POA: Diagnosis not present

## 2020-09-22 DIAGNOSIS — E8770 Fluid overload, unspecified: Secondary | ICD-10-CM | POA: Diagnosis not present

## 2020-09-22 DIAGNOSIS — T148XXA Other injury of unspecified body region, initial encounter: Secondary | ICD-10-CM

## 2020-09-22 DIAGNOSIS — F339 Major depressive disorder, recurrent, unspecified: Secondary | ICD-10-CM | POA: Diagnosis not present

## 2020-09-22 DIAGNOSIS — D62 Acute posthemorrhagic anemia: Secondary | ICD-10-CM | POA: Diagnosis present

## 2020-09-22 DIAGNOSIS — S1093XA Contusion of unspecified part of neck, initial encounter: Secondary | ICD-10-CM | POA: Diagnosis present

## 2020-09-22 DIAGNOSIS — S92061A Displaced intraarticular fracture of right calcaneus, initial encounter for closed fracture: Secondary | ICD-10-CM | POA: Diagnosis present

## 2020-09-22 DIAGNOSIS — F32A Depression, unspecified: Secondary | ICD-10-CM | POA: Diagnosis present

## 2020-09-22 DIAGNOSIS — E669 Obesity, unspecified: Secondary | ICD-10-CM | POA: Diagnosis present

## 2020-09-22 DIAGNOSIS — S2222XA Fracture of body of sternum, initial encounter for closed fracture: Secondary | ICD-10-CM | POA: Diagnosis present

## 2020-09-22 DIAGNOSIS — S82141S Displaced bicondylar fracture of right tibia, sequela: Secondary | ICD-10-CM | POA: Diagnosis not present

## 2020-09-22 DIAGNOSIS — Z79899 Other long term (current) drug therapy: Secondary | ICD-10-CM

## 2020-09-22 DIAGNOSIS — S72402C Unspecified fracture of lower end of left femur, initial encounter for open fracture type IIIA, IIIB, or IIIC: Secondary | ICD-10-CM | POA: Diagnosis not present

## 2020-09-22 DIAGNOSIS — B379 Candidiasis, unspecified: Secondary | ICD-10-CM | POA: Diagnosis not present

## 2020-09-22 DIAGNOSIS — T8132XA Disruption of internal operation (surgical) wound, not elsewhere classified, initial encounter: Secondary | ICD-10-CM | POA: Diagnosis not present

## 2020-09-22 DIAGNOSIS — R0902 Hypoxemia: Secondary | ICD-10-CM | POA: Diagnosis not present

## 2020-09-22 DIAGNOSIS — R0689 Other abnormalities of breathing: Secondary | ICD-10-CM | POA: Diagnosis not present

## 2020-09-22 DIAGNOSIS — S36593A Other injury of sigmoid colon, initial encounter: Secondary | ICD-10-CM | POA: Diagnosis not present

## 2020-09-22 DIAGNOSIS — J9690 Respiratory failure, unspecified, unspecified whether with hypoxia or hypercapnia: Secondary | ICD-10-CM

## 2020-09-22 DIAGNOSIS — S92211A Displaced fracture of cuboid bone of right foot, initial encounter for closed fracture: Secondary | ICD-10-CM | POA: Diagnosis present

## 2020-09-22 DIAGNOSIS — S82141A Displaced bicondylar fracture of right tibia, initial encounter for closed fracture: Secondary | ICD-10-CM | POA: Diagnosis present

## 2020-09-22 DIAGNOSIS — M8618 Other acute osteomyelitis, other site: Secondary | ICD-10-CM | POA: Diagnosis not present

## 2020-09-22 DIAGNOSIS — Z4682 Encounter for fitting and adjustment of non-vascular catheter: Secondary | ICD-10-CM

## 2020-09-22 DIAGNOSIS — S82142C Displaced bicondylar fracture of left tibia, initial encounter for open fracture type IIIA, IIIB, or IIIC: Secondary | ICD-10-CM | POA: Diagnosis not present

## 2020-09-22 DIAGNOSIS — S52601A Unspecified fracture of lower end of right ulna, initial encounter for closed fracture: Secondary | ICD-10-CM | POA: Diagnosis present

## 2020-09-22 DIAGNOSIS — R1312 Dysphagia, oropharyngeal phase: Secondary | ICD-10-CM | POA: Diagnosis not present

## 2020-09-22 DIAGNOSIS — R001 Bradycardia, unspecified: Secondary | ICD-10-CM | POA: Diagnosis not present

## 2020-09-22 DIAGNOSIS — S2221XA Fracture of manubrium, initial encounter for closed fracture: Secondary | ICD-10-CM | POA: Diagnosis present

## 2020-09-22 DIAGNOSIS — B966 Bacteroides fragilis [B. fragilis] as the cause of diseases classified elsewhere: Secondary | ICD-10-CM | POA: Diagnosis not present

## 2020-09-22 DIAGNOSIS — J9600 Acute respiratory failure, unspecified whether with hypoxia or hypercapnia: Secondary | ICD-10-CM | POA: Diagnosis not present

## 2020-09-22 DIAGNOSIS — R531 Weakness: Secondary | ICD-10-CM | POA: Diagnosis not present

## 2020-09-22 DIAGNOSIS — J939 Pneumothorax, unspecified: Secondary | ICD-10-CM

## 2020-09-22 DIAGNOSIS — R5381 Other malaise: Secondary | ICD-10-CM | POA: Diagnosis not present

## 2020-09-22 DIAGNOSIS — D696 Thrombocytopenia, unspecified: Secondary | ICD-10-CM | POA: Diagnosis present

## 2020-09-22 DIAGNOSIS — G629 Polyneuropathy, unspecified: Secondary | ICD-10-CM | POA: Diagnosis not present

## 2020-09-22 DIAGNOSIS — S72401A Unspecified fracture of lower end of right femur, initial encounter for closed fracture: Secondary | ICD-10-CM | POA: Diagnosis not present

## 2020-09-22 DIAGNOSIS — E440 Moderate protein-calorie malnutrition: Secondary | ICD-10-CM | POA: Diagnosis not present

## 2020-09-22 DIAGNOSIS — S36892A Contusion of other intra-abdominal organs, initial encounter: Secondary | ICD-10-CM | POA: Diagnosis present

## 2020-09-22 DIAGNOSIS — E878 Other disorders of electrolyte and fluid balance, not elsewhere classified: Secondary | ICD-10-CM | POA: Diagnosis not present

## 2020-09-22 DIAGNOSIS — R490 Dysphonia: Secondary | ICD-10-CM | POA: Diagnosis not present

## 2020-09-22 DIAGNOSIS — Z7189 Other specified counseling: Secondary | ICD-10-CM | POA: Diagnosis not present

## 2020-09-22 DIAGNOSIS — J9503 Malfunction of tracheostomy stoma: Secondary | ICD-10-CM | POA: Diagnosis not present

## 2020-09-22 DIAGNOSIS — N390 Urinary tract infection, site not specified: Secondary | ICD-10-CM | POA: Diagnosis not present

## 2020-09-22 DIAGNOSIS — S72461A Displaced supracondylar fracture with intracondylar extension of lower end of right femur, initial encounter for closed fracture: Secondary | ICD-10-CM | POA: Diagnosis present

## 2020-09-22 HISTORY — DX: Plantar fascial fibromatosis: M72.2

## 2020-09-22 LAB — RESPIRATORY PANEL BY RT PCR (FLU A&B, COVID)
Influenza A by PCR: NEGATIVE
Influenza B by PCR: NEGATIVE
SARS Coronavirus 2 by RT PCR: NEGATIVE

## 2020-09-22 LAB — I-STAT CHEM 8, ED
BUN: 18 mg/dL (ref 6–20)
Calcium, Ion: 1.01 mmol/L — ABNORMAL LOW (ref 1.15–1.40)
Chloride: 110 mmol/L (ref 98–111)
Creatinine, Ser: 1.2 mg/dL — ABNORMAL HIGH (ref 0.44–1.00)
Glucose, Bld: 140 mg/dL — ABNORMAL HIGH (ref 70–99)
HCT: 42 % (ref 36.0–46.0)
Hemoglobin: 14.3 g/dL (ref 12.0–15.0)
Potassium: 3.9 mmol/L (ref 3.5–5.1)
Sodium: 143 mmol/L (ref 135–145)
TCO2: 21 mmol/L — ABNORMAL LOW (ref 22–32)

## 2020-09-22 LAB — TRAUMA TEG PANEL
CFF Max Amplitude: 24.3 mm (ref 15–32)
Citrated Kaolin (R): 4.6 min (ref 4.6–9.1)
Citrated Rapid TEG (MA): 64.7 mm (ref 52–70)
Lysis at 30 Minutes: 0.3 % (ref 0.0–2.6)

## 2020-09-22 LAB — COMPREHENSIVE METABOLIC PANEL
ALT: 52 U/L — ABNORMAL HIGH (ref 0–44)
AST: 71 U/L — ABNORMAL HIGH (ref 15–41)
Albumin: 3.2 g/dL — ABNORMAL LOW (ref 3.5–5.0)
Alkaline Phosphatase: 47 U/L (ref 38–126)
Anion gap: 11 (ref 5–15)
BUN: 16 mg/dL (ref 6–20)
CO2: 22 mmol/L (ref 22–32)
Calcium: 8.1 mg/dL — ABNORMAL LOW (ref 8.9–10.3)
Chloride: 110 mmol/L (ref 98–111)
Creatinine, Ser: 1.2 mg/dL — ABNORMAL HIGH (ref 0.44–1.00)
GFR calc Af Amer: >60 mL/min (ref >60)
GFR calc non Af Amer: 52 mL/min — ABNORMAL LOW (ref >60)
Glucose, Bld: 148 mg/dL — ABNORMAL HIGH (ref 70–99)
Potassium: 4.2 mmol/L (ref 3.5–5.1)
Sodium: 143 mmol/L (ref 135–145)
Total Bilirubin: 0.3 mg/dL (ref 0.3–1.2)
Total Protein: 5.9 g/dL — ABNORMAL LOW (ref 6.5–8.1)

## 2020-09-22 LAB — CBC
HCT: 41.9 % (ref 36.0–46.0)
Hemoglobin: 13.2 g/dL (ref 12.0–15.0)
MCH: 31.1 pg (ref 26.0–34.0)
MCHC: 31.5 g/dL (ref 30.0–36.0)
MCV: 98.8 fL (ref 80.0–100.0)
Platelets: 240 10*3/uL (ref 150–400)
RBC: 4.24 MIL/uL (ref 3.87–5.11)
RDW: 14.2 % (ref 11.5–15.5)
WBC: 15.7 10*3/uL — ABNORMAL HIGH (ref 4.0–10.5)
nRBC: 0 % (ref 0.0–0.2)

## 2020-09-22 LAB — ABO/RH: ABO/RH(D): O POS

## 2020-09-22 LAB — PROTIME-INR
INR: 1.1 (ref 0.8–1.2)
Prothrombin Time: 13.8 seconds (ref 11.4–15.2)

## 2020-09-22 LAB — ETHANOL: Alcohol, Ethyl (B): <10 mg/dL (ref <10)

## 2020-09-22 LAB — SAMPLE TO BLOOD BANK

## 2020-09-22 SURGERY — LAPAROTOMY, EXPLORATORY
Anesthesia: General | Site: Knee

## 2020-09-22 MED ORDER — FENTANYL CITRATE (PF) 100 MCG/2ML IJ SOLN
INTRAMUSCULAR | Status: DC | PRN
Start: 2020-09-22 — End: 2020-09-23
  Administered 2020-09-22 (×2): 100 ug via INTRAVENOUS
  Administered 2020-09-23: 50 ug via INTRAVENOUS

## 2020-09-22 MED ORDER — SUCCINYLCHOLINE CHLORIDE 20 MG/ML IJ SOLN
INTRAMUSCULAR | Status: DC | PRN
  Administered 2020-09-22: 120 mg via INTRAVENOUS

## 2020-09-22 MED ORDER — ETOMIDATE 2 MG/ML IV SOLN
INTRAVENOUS | Status: DC | PRN
  Administered 2020-09-22: 10 mg via INTRAVENOUS

## 2020-09-22 MED ORDER — FENTANYL CITRATE (PF) 250 MCG/5ML IJ SOLN
INTRAMUSCULAR | Status: AC
  Filled 2020-09-22: qty 5

## 2020-09-22 MED ORDER — TETANUS-DIPHTH-ACELL PERTUSSIS 5-2.5-18.5 LF-MCG/0.5 IM SUSP
0.5000 mL | Freq: Once | INTRAMUSCULAR | Status: AC
  Administered 2020-09-22: 0.5 mL via INTRAMUSCULAR

## 2020-09-22 MED ORDER — CEFAZOLIN SODIUM-DEXTROSE 2-3 GM-%(50ML) IV SOLR
INTRAVENOUS | Status: DC | PRN
  Administered 2020-09-22: 2 g via INTRAVENOUS

## 2020-09-22 MED ORDER — PHENYLEPHRINE HCL-NACL 10-0.9 MG/250ML-% IV SOLN
INTRAVENOUS | Status: DC | PRN
  Administered 2020-09-22: 50 ug/min via INTRAVENOUS

## 2020-09-22 MED ORDER — ROCURONIUM BROMIDE 100 MG/10ML IV SOLN
INTRAVENOUS | Status: DC | PRN
  Administered 2020-09-22: 30 mg via INTRAVENOUS
  Administered 2020-09-22 (×2): 50 mg via INTRAVENOUS
  Administered 2020-09-23: 40 mg via INTRAVENOUS
  Administered 2020-09-23: 30 mg via INTRAVENOUS

## 2020-09-22 MED ORDER — ROCURONIUM BROMIDE 10 MG/ML (PF) SYRINGE
PREFILLED_SYRINGE | INTRAVENOUS | Status: AC
  Filled 2020-09-22: qty 10

## 2020-09-22 MED ORDER — 0.9 % SODIUM CHLORIDE (POUR BTL) OPTIME
TOPICAL | Status: DC | PRN
  Administered 2020-09-22: 3000 mL

## 2020-09-22 MED ORDER — IOHEXOL 300 MG/ML  SOLN
100.0000 mL | Freq: Once | INTRAMUSCULAR | Status: AC | PRN
  Administered 2020-09-22: 100 mL via INTRAVENOUS

## 2020-09-22 MED ORDER — SODIUM CHLORIDE 0.9 % IV SOLN: INTRAVENOUS | Status: DC | PRN

## 2020-09-22 SURGICAL SUPPLY — 61 items
APL PRP STRL LF DISP 70% ISPRP (MISCELLANEOUS) ×2
BLADE CLIPPER SURG (BLADE) IMPLANT
BNDG ELASTIC 4X5.8 VLCR STR LF (GAUZE/BANDAGES/DRESSINGS) ×3 IMPLANT
BNDG ELASTIC 6X5.8 VLCR STR LF (GAUZE/BANDAGES/DRESSINGS) ×3 IMPLANT
CANISTER SUCT 3000ML PPV (MISCELLANEOUS) ×3 IMPLANT
CANISTER WOUND CARE 500ML ATS (WOUND CARE) ×3 IMPLANT
CHLORAPREP W/TINT 26 (MISCELLANEOUS) ×3 IMPLANT
CLAMP LG COMBINATION (Clamp) ×3 IMPLANT
CLAMP LG MULTI PIN (Clamp) ×6 IMPLANT
COVER SURGICAL LIGHT HANDLE (MISCELLANEOUS) ×3 IMPLANT
COVER WAND RF STERILE (DRAPES) ×3 IMPLANT
DRAPE LAPAROSCOPIC ABDOMINAL (DRAPES) ×3 IMPLANT
DRAPE U-SHAPE 76X120 STRL (DRAPES) ×6 IMPLANT
DRAPE WARM FLUID 44X44 (DRAPES) ×3 IMPLANT
DRSG OPSITE POSTOP 4X10 (GAUZE/BANDAGES/DRESSINGS) IMPLANT
DRSG OPSITE POSTOP 4X8 (GAUZE/BANDAGES/DRESSINGS) IMPLANT
DRSG VAC ATS LRG SENSATRAC (GAUZE/BANDAGES/DRESSINGS) ×3 IMPLANT
ELECT BLADE 6.5 EXT (BLADE) IMPLANT
ELECT CAUTERY BLADE 6.4 (BLADE) ×3 IMPLANT
ELECT REM PT RETURN 9FT ADLT (ELECTROSURGICAL) ×3
ELECTRODE REM PT RTRN 9FT ADLT (ELECTROSURGICAL) ×2 IMPLANT
GLOVE BIO SURGEON STRL SZ 6 (GLOVE) ×3 IMPLANT
GLOVE INDICATOR 6.5 STRL GRN (GLOVE) ×3 IMPLANT
GOWN STRL REUS W/ TWL LRG LVL3 (GOWN DISPOSABLE) ×4 IMPLANT
GOWN STRL REUS W/TWL LRG LVL3 (GOWN DISPOSABLE) ×6
HANDLE SUCTION POOLE (INSTRUMENTS) ×4 IMPLANT
IMMOBILIZER KNEE 24 THIGH 36 (MISCELLANEOUS) ×4 IMPLANT
IMMOBILIZER KNEE 24 UNIV (MISCELLANEOUS) ×6
KIT BASIN OR (CUSTOM PROCEDURE TRAY) ×3 IMPLANT
KIT TURNOVER KIT B (KITS) ×3 IMPLANT
LIGASURE IMPACT 36 18CM CVD LR (INSTRUMENTS) IMPLANT
NS IRRIG 1000ML POUR BTL (IV SOLUTION) ×6 IMPLANT
PACK GENERAL/GYN (CUSTOM PROCEDURE TRAY) ×3 IMPLANT
PACK ORTHO EXTREMITY (CUSTOM PROCEDURE TRAY) ×3 IMPLANT
PAD ABD 8X10 STRL (GAUZE/BANDAGES/DRESSINGS) ×3 IMPLANT
PAD ARMBOARD 7.5X6 YLW CONV (MISCELLANEOUS) ×3 IMPLANT
PENCIL SMOKE EVACUATOR (MISCELLANEOUS) ×3 IMPLANT
RELOAD PROXIMATE 75MM BLUE (ENDOMECHANICALS) ×15 IMPLANT
RELOAD PROXIMATE TA60MM BLUE (ENDOMECHANICALS) ×3 IMPLANT
ROD CRBN FBR LRG EX-FX 11X300 (Rod) ×6 IMPLANT
SCREW SCHNZ SD 5.0 60 THRD/150 (Screw) ×4 IMPLANT
SCREW SHANZ 5.0X175MM (Screw) ×6 IMPLANT
SCRW SCHANZ SD 5.0 60 THRD/150 (Screw) ×6 IMPLANT
SET CYSTO W/LG BORE CLAMP LF (SET/KITS/TRAYS/PACK) ×3 IMPLANT
SPECIMEN JAR LARGE (MISCELLANEOUS) IMPLANT
SPONGE ABD ABTHERA ADVANCE (MISCELLANEOUS) ×3 IMPLANT
SPONGE LAP 18X18 RF (DISPOSABLE) ×6 IMPLANT
STAPLER GUN LINEAR PROX 60 (STAPLE) ×3 IMPLANT
STAPLER PROXIMATE 75MM BLUE (STAPLE) ×3 IMPLANT
STAPLER VISISTAT 35W (STAPLE) ×3 IMPLANT
SUCTION POOLE HANDLE (INSTRUMENTS) ×6
SUT ETHILON 2 0 FS 18 (SUTURE) ×6 IMPLANT
SUT PDS AB 1 TP1 96 (SUTURE) ×6 IMPLANT
SUT SILK 2 0 SH CR/8 (SUTURE) ×3 IMPLANT
SUT SILK 2 0 TIES 10X30 (SUTURE) ×3 IMPLANT
SUT SILK 3 0 SH CR/8 (SUTURE) ×9 IMPLANT
SUT SILK 3 0 TIES 10X30 (SUTURE) ×3 IMPLANT
SUT VIC AB 3-0 SH 18 (SUTURE) IMPLANT
TOWEL GREEN STERILE (TOWEL DISPOSABLE) ×3 IMPLANT
TRAY FOLEY MTR SLVR 16FR STAT (SET/KITS/TRAYS/PACK) IMPLANT
TUBE CONNECTING 12X1/4 (SUCTIONS) ×3 IMPLANT

## 2020-09-22 NOTE — ED Notes (Signed)
Rad testing delayed d/t unable to obtain lab samples and IV access, TRN and MD attempting IV sticks.

## 2020-09-22 NOTE — ED Notes (Signed)
Report given to CRNA, OR is ready after xrays completed at bedside

## 2020-09-22 NOTE — ED Notes (Signed)
Pts daughter [name] is in the parking lot, please call with an update [name] [phone].

## 2020-09-22 NOTE — ED Notes (Signed)
Emergency  Release blood being infused via Rapid infuser, pt diaphoretic and hypotensive

## 2020-09-22 NOTE — Anesthesia Preprocedure Evaluation (Signed)
Anesthesia Evaluation  Patient identified by MRN, date of birth, ID bandPreop documentation limited or incomplete due to emergent nature of procedure.  Airway        Dental   Pulmonary           Cardiovascular      Neuro/Psych    GI/Hepatic History noted CG   Endo/Other    Renal/GU      Musculoskeletal   Abdominal   Peds  Hematology   Anesthesia Other Findings   Reproductive/Obstetrics                             Anesthesia Physical Anesthesia Plan  ASA: III  Anesthesia Plan: General   Post-op Pain Management:    Induction: Intravenous  PONV Risk Score and Plan: 3 and Ondansetron, Dexamethasone and Midazolam  Airway Management Planned: Oral ETT  Additional Equipment:   Intra-op Plan:   Post-operative Plan: Possible Post-op intubation/ventilation  Informed Consent: I have reviewed the patients History and Physical, chart, labs and discussed the procedure including the risks, benefits and alternatives for the proposed anesthesia with the patient or authorized representative who has indicated his/her understanding and acceptance.     Dental advisory given  Plan Discussed with: CRNA and Anesthesiologist  Anesthesia Plan Comments:         Anesthesia Quick Evaluation
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
## 2020-09-22 NOTE — ED Notes (Signed)
Pt needs confirmation tube for type and screen
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
## 2020-09-22 NOTE — Progress Notes (Signed)
Orthopedic Tech Progress Note Patient Details:  Sara [name] [birth year] [id number] Level 1 Trauma  Patient ID: Sara [name], female   DOB: 1966/06/06, 54 y.o.   MRN: [id number]   [name] 09/22/2020, 9:39 PM

## 2020-09-22 NOTE — Anesthesia Procedure Notes (Addendum)
Central Venous Catheter Insertion Performed by: Dorris Singh, MD, anesthesiologist Start/End9/24/2021 10:30 PM, 09/22/2020 10:45 PM Preanesthetic checklist: patient identified, IV checked, site marked, risks and benefits discussed, surgical consent, monitors and equipment checked, pre-op evaluation and timeout performed Position: Trendelenburg Lidocaine 1% used for infiltration and patient sedated Hand hygiene performed , maximum sterile barriers used  and Seldinger technique used Central line was placed.Double lumen Procedure performed using ultrasound guided technique. Ultrasound Notes:anatomy identified and image(s) printed for medical record Attempts: 1 Following insertion, line sutured, dressing applied and Biopatch. Post procedure assessment: blood return through all ports  Patient tolerated the procedure well with no immediate complications.
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
## 2020-09-23 ENCOUNTER — Inpatient Hospital Stay (HOSPITAL_COMMUNITY): Payer: Medicaid Other

## 2020-09-23 DIAGNOSIS — D62 Acute posthemorrhagic anemia: Secondary | ICD-10-CM | POA: Diagnosis not present

## 2020-09-23 DIAGNOSIS — J80 Acute respiratory distress syndrome: Secondary | ICD-10-CM | POA: Diagnosis not present

## 2020-09-23 DIAGNOSIS — K659 Peritonitis, unspecified: Secondary | ICD-10-CM | POA: Diagnosis not present

## 2020-09-23 DIAGNOSIS — D689 Coagulation defect, unspecified: Secondary | ICD-10-CM | POA: Diagnosis not present

## 2020-09-23 DIAGNOSIS — S72462C Displaced supracondylar fracture with intracondylar extension of lower end of left femur, initial encounter for open fracture type IIIA, IIIB, or IIIC: Secondary | ICD-10-CM | POA: Diagnosis not present

## 2020-09-23 DIAGNOSIS — S36892A Contusion of other intra-abdominal organs, initial encounter: Secondary | ICD-10-CM | POA: Diagnosis not present

## 2020-09-23 DIAGNOSIS — Z20822 Contact with and (suspected) exposure to covid-19: Secondary | ICD-10-CM | POA: Diagnosis not present

## 2020-09-23 DIAGNOSIS — Z23 Encounter for immunization: Secondary | ICD-10-CM | POA: Diagnosis not present

## 2020-09-23 DIAGNOSIS — T07XXXA Unspecified multiple injuries, initial encounter: Secondary | ICD-10-CM | POA: Diagnosis present

## 2020-09-23 DIAGNOSIS — I959 Hypotension, unspecified: Secondary | ICD-10-CM | POA: Diagnosis not present

## 2020-09-23 DIAGNOSIS — S82142A Displaced bicondylar fracture of left tibia, initial encounter for closed fracture: Secondary | ICD-10-CM | POA: Diagnosis not present

## 2020-09-23 DIAGNOSIS — S2243XA Multiple fractures of ribs, bilateral, initial encounter for closed fracture: Secondary | ICD-10-CM | POA: Diagnosis not present

## 2020-09-23 DIAGNOSIS — J869 Pyothorax without fistula: Secondary | ICD-10-CM | POA: Diagnosis not present

## 2020-09-23 DIAGNOSIS — S52501A Unspecified fracture of the lower end of right radius, initial encounter for closed fracture: Secondary | ICD-10-CM | POA: Diagnosis not present

## 2020-09-23 LAB — POCT I-STAT 7, (LYTES, BLD GAS, ICA,H+H)
Acid-Base Excess: 0 mmol/L (ref 0.0–2.0)
Acid-Base Excess: 0 mmol/L (ref 0.0–2.0)
Acid-Base Excess: 3 mmol/L — ABNORMAL HIGH (ref 0.0–2.0)
Acid-base deficit: 4 mmol/L — ABNORMAL HIGH (ref 0.0–2.0)
Acid-base deficit: 4 mmol/L — ABNORMAL HIGH (ref 0.0–2.0)
Bicarbonate: 24.5 mmol/L (ref 20.0–28.0)
Bicarbonate: 26 mmol/L (ref 20.0–28.0)
Bicarbonate: 29.2 mmol/L — ABNORMAL HIGH (ref 20.0–28.0)
Bicarbonate: 20.9 mmol/L (ref 20.0–28.0)
Bicarbonate: 22.3 mmol/L (ref 20.0–28.0)
Calcium, Ion: 0.96 mmol/L — ABNORMAL LOW (ref 1.15–1.40)
Calcium, Ion: 1.05 mmol/L — ABNORMAL LOW (ref 1.15–1.40)
Calcium, Ion: 1.06 mmol/L — ABNORMAL LOW (ref 1.15–1.40)
Calcium, Ion: 0.99 mmol/L — ABNORMAL LOW (ref 1.15–1.40)
Calcium, Ion: 1.02 mmol/L — ABNORMAL LOW (ref 1.15–1.40)
HCT: 30 % — ABNORMAL LOW (ref 36.0–46.0)
HCT: 30 % — ABNORMAL LOW (ref 36.0–46.0)
HCT: 34 % — ABNORMAL LOW (ref 36.0–46.0)
HCT: 22 % — ABNORMAL LOW (ref 36.0–46.0)
HCT: 27 % — ABNORMAL LOW (ref 36.0–46.0)
Hemoglobin: 10.2 g/dL — ABNORMAL LOW (ref 12.0–15.0)
Hemoglobin: 10.2 g/dL — ABNORMAL LOW (ref 12.0–15.0)
Hemoglobin: 11.6 g/dL — ABNORMAL LOW (ref 12.0–15.0)
Hemoglobin: 7.5 g/dL — ABNORMAL LOW (ref 12.0–15.0)
Hemoglobin: 9.2 g/dL — ABNORMAL LOW (ref 12.0–15.0)
O2 Saturation: 100 %
O2 Saturation: 100 %
O2 Saturation: 100 %
O2 Saturation: 64 %
O2 Saturation: 94 %
Patient temperature: 34.8
Patient temperature: 35.3
Patient temperature: 98.5
Patient temperature: 98.5
Potassium: 4 mmol/L (ref 3.5–5.1)
Potassium: 4.8 mmol/L (ref 3.5–5.1)
Potassium: 4.8 mmol/L (ref 3.5–5.1)
Potassium: 4.4 mmol/L (ref 3.5–5.1)
Potassium: 4.6 mmol/L (ref 3.5–5.1)
Sodium: 142 mmol/L (ref 135–145)
Sodium: 143 mmol/L (ref 135–145)
Sodium: 144 mmol/L (ref 135–145)
Sodium: 144 mmol/L (ref 135–145)
Sodium: 144 mmol/L (ref 135–145)
TCO2: 26 mmol/L (ref 22–32)
TCO2: 27 mmol/L (ref 22–32)
TCO2: 31 mmol/L (ref 22–32)
TCO2: 22 mmol/L (ref 22–32)
TCO2: 24 mmol/L (ref 22–32)
pCO2 arterial: 35.2 mmHg (ref 32.0–48.0)
pCO2 arterial: 45.8 mmHg (ref 32.0–48.0)
pCO2 arterial: 53.1 mmHg — ABNORMAL HIGH (ref 32.0–48.0)
pCO2 arterial: 36 mmHg (ref 32.0–48.0)
pCO2 arterial: 45.8 mmHg (ref 32.0–48.0)
pH, Arterial: 7.349 — ABNORMAL LOW (ref 7.350–7.450)
pH, Arterial: 7.353 (ref 7.350–7.450)
pH, Arterial: 7.442 (ref 7.350–7.450)
pH, Arterial: 7.296 — ABNORMAL LOW (ref 7.350–7.450)
pH, Arterial: 7.372 (ref 7.350–7.450)
pO2, Arterial: 271 mmHg — ABNORMAL HIGH (ref 83.0–108.0)
pO2, Arterial: 448 mmHg — ABNORMAL HIGH (ref 83.0–108.0)
pO2, Arterial: 488 mmHg — ABNORMAL HIGH (ref 83.0–108.0)
pO2, Arterial: 37 mmHg — CL (ref 83.0–108.0)
pO2, Arterial: 70 mmHg — ABNORMAL LOW (ref 83.0–108.0)

## 2020-09-23 LAB — PREPARE FRESH FROZEN PLASMA
Unit division: 0
Unit division: 0
Unit division: 0

## 2020-09-23 LAB — BLOOD GAS, ARTERIAL
Acid-base deficit: 1.4 mmol/L (ref 0.0–2.0)
Bicarbonate: 23 mmol/L (ref 20.0–28.0)
Drawn by: 44166
FIO2: 100
O2 Saturation: 99.6 %
Patient temperature: 37
pCO2 arterial: 39.5 mmHg (ref 32.0–48.0)
pH, Arterial: 7.382 (ref 7.350–7.450)
pO2, Arterial: 245 mmHg — ABNORMAL HIGH (ref 83.0–108.0)

## 2020-09-23 LAB — RAPID URINE DRUG SCREEN, HOSP PERFORMED
Amphetamines: NOT DETECTED
Barbiturates: NOT DETECTED
Benzodiazepines: NOT DETECTED
Cocaine: NOT DETECTED
Opiates: NOT DETECTED
Tetrahydrocannabinol: NOT DETECTED

## 2020-09-23 LAB — BPAM FFP
Blood Product Expiration Date: 202109252359
Blood Product Expiration Date: 202109252359
Blood Product Expiration Date: 202109252359
Blood Product Expiration Date: 202109252359
Blood Product Expiration Date: 202110062359
Blood Product Expiration Date: 202110062359
ISSUE DATE / TIME: 202109242125
ISSUE DATE / TIME: 202109242138
ISSUE DATE / TIME: 202109242242
ISSUE DATE / TIME: 202109242242
ISSUE DATE / TIME: 202109242242
ISSUE DATE / TIME: 202109242242
Unit Type and Rh: 5100
Unit Type and Rh: 5100
Unit Type and Rh: 5100
Unit Type and Rh: 5100
Unit Type and Rh: 6200
Unit Type and Rh: 6200

## 2020-09-23 LAB — URINALYSIS, COMPLETE (UACMP) WITH MICROSCOPIC
Bilirubin Urine: NEGATIVE
Glucose, UA: NEGATIVE mg/dL
Ketones, ur: NEGATIVE mg/dL
Leukocytes,Ua: NEGATIVE
Nitrite: NEGATIVE
Protein, ur: NEGATIVE mg/dL
Specific Gravity, Urine: 1.036 — ABNORMAL HIGH (ref 1.005–1.030)
pH: 5 (ref 5.0–8.0)

## 2020-09-23 LAB — CBC
HCT: 29.3 % — ABNORMAL LOW (ref 36.0–46.0)
HCT: 32.1 % — ABNORMAL LOW (ref 36.0–46.0)
Hemoglobin: 9.4 g/dL — ABNORMAL LOW (ref 12.0–15.0)
Hemoglobin: 10.5 g/dL — ABNORMAL LOW (ref 12.0–15.0)
MCH: 29.9 pg (ref 26.0–34.0)
MCH: 30.4 pg (ref 26.0–34.0)
MCHC: 32.1 g/dL (ref 30.0–36.0)
MCHC: 32.7 g/dL (ref 30.0–36.0)
MCV: 93.3 fL (ref 80.0–100.0)
MCV: 93 fL (ref 80.0–100.0)
Platelets: 96 10*3/uL — ABNORMAL LOW (ref 150–400)
Platelets: 95 10*3/uL — ABNORMAL LOW (ref 150–400)
RBC: 3.14 MIL/uL — ABNORMAL LOW (ref 3.87–5.11)
RBC: 3.45 MIL/uL — ABNORMAL LOW (ref 3.87–5.11)
RDW: 16.1 % — ABNORMAL HIGH (ref 11.5–15.5)
RDW: 16.1 % — ABNORMAL HIGH (ref 11.5–15.5)
WBC: 9.1 10*3/uL (ref 4.0–10.5)
WBC: 10 10*3/uL (ref 4.0–10.5)
nRBC: 0 % (ref 0.0–0.2)
nRBC: 0 % (ref 0.0–0.2)

## 2020-09-23 LAB — BASIC METABOLIC PANEL
Anion gap: 10 (ref 5–15)
BUN: 16 mg/dL (ref 6–20)
CO2: 24 mmol/L (ref 22–32)
Calcium: 8.4 mg/dL — ABNORMAL LOW (ref 8.9–10.3)
Chloride: 111 mmol/L (ref 98–111)
Creatinine, Ser: 1.05 mg/dL — ABNORMAL HIGH (ref 0.44–1.00)
GFR calc Af Amer: >60 mL/min (ref >60)
GFR calc non Af Amer: >60 mL/min (ref >60)
Glucose, Bld: 137 mg/dL — ABNORMAL HIGH (ref 70–99)
Potassium: 3.6 mmol/L (ref 3.5–5.1)
Sodium: 145 mmol/L (ref 135–145)

## 2020-09-23 LAB — MRSA PCR SCREENING: MRSA by PCR: NEGATIVE

## 2020-09-23 LAB — TRIGLYCERIDES: Triglycerides: 72 mg/dL (ref <150)

## 2020-09-23 LAB — TROPONIN I (HIGH SENSITIVITY): Troponin I (High Sensitivity): 38 ng/L — ABNORMAL HIGH (ref <18)

## 2020-09-23 LAB — PREPARE RBC (CROSSMATCH)

## 2020-09-23 LAB — HIV ANTIBODY (ROUTINE TESTING W REFLEX): HIV Screen 4th Generation wRfx: NONREACTIVE

## 2020-09-23 LAB — LACTIC ACID, PLASMA: Lactic Acid, Venous: 2.8 mmol/L (ref 0.5–1.9)

## 2020-09-23 MED ORDER — LACTATED RINGERS IV BOLUS
1000.0000 mL | Freq: Once | INTRAVENOUS | Status: AC
  Administered 2020-09-23: 1000 mL via INTRAVENOUS

## 2020-09-23 MED ORDER — CALCIUM CHLORIDE 10 % IV SOLN
INTRAVENOUS | Status: DC | PRN
  Administered 2020-09-23: 1 g via INTRAVENOUS

## 2020-09-23 MED ORDER — CHLORHEXIDINE GLUCONATE CLOTH 2 % EX PADS
6.0000 | MEDICATED_PAD | Freq: Every day | CUTANEOUS | Status: DC
  Administered 2020-09-24 – 2020-09-25 (×2): 6 via TOPICAL

## 2020-09-23 MED ORDER — ALBUMIN HUMAN 5 % IV SOLN
12.5000 g | Freq: Two times a day (BID) | INTRAVENOUS | Status: AC
  Administered 2020-09-23 (×2): 12.5 g via INTRAVENOUS

## 2020-09-23 MED ORDER — MUPIROCIN 2 % EX OINT
1.0000 "application " | TOPICAL_OINTMENT | Freq: Two times a day (BID) | CUTANEOUS | Status: DC
  Administered 2020-09-24 – 2020-09-25 (×2): 1 via NASAL
  Filled 2020-09-23: qty 22

## 2020-09-23 MED ORDER — FENTANYL BOLUS VIA INFUSION
50.0000 ug | INTRAVENOUS | Status: DC | PRN
  Administered 2020-10-11 – 2020-10-16 (×6): 50 ug via INTRAVENOUS
  Filled 2020-09-23 (×2): qty 50

## 2020-09-23 MED ORDER — SODIUM CHLORIDE 0.9 % IV BOLUS
1000.0000 mL | Freq: Once | INTRAVENOUS | Status: AC
  Administered 2020-09-23: 1000 mL via INTRAVENOUS

## 2020-09-23 MED ORDER — PROPOFOL 500 MG/50ML IV EMUL
INTRAVENOUS | Status: DC | PRN
  Administered 2020-09-23: 30 ug/kg/min via INTRAVENOUS

## 2020-09-23 MED ORDER — FENTANYL CITRATE (PF) 100 MCG/2ML IJ SOLN: 50.0000 ug | Freq: Once | INTRAMUSCULAR | Status: DC

## 2020-09-23 MED ORDER — LACTATED RINGERS IV SOLN: INTRAVENOUS | Status: DC | PRN

## 2020-09-23 MED ORDER — PHENYLEPHRINE HCL-NACL 10-0.9 MG/250ML-% IV SOLN
0.0000 ug/min | INTRAVENOUS | Status: DC
  Administered 2020-09-23: 60 ug/min via INTRAVENOUS
  Administered 2020-09-23: 40 ug/min via INTRAVENOUS
  Administered 2020-09-23: 60 ug/min via INTRAVENOUS
  Administered 2020-09-23: 90 ug/min via INTRAVENOUS
  Administered 2020-09-23 (×2): 120 ug/min via INTRAVENOUS
  Administered 2020-09-23: 20 ug/min via INTRAVENOUS
  Administered 2020-09-24: 13.333 ug/min via INTRAVENOUS
  Administered 2020-09-24: 160 ug/min via INTRAVENOUS
  Administered 2020-09-24: 110 ug/min via INTRAVENOUS
  Administered 2020-09-24 (×3): 60 ug/min via INTRAVENOUS
  Administered 2020-09-24: 120 ug/min via INTRAVENOUS
  Administered 2020-09-24: 160 ug/min via INTRAVENOUS
  Administered 2020-09-25 (×2): 60 ug/min via INTRAVENOUS
  Filled 2020-09-23: qty 250
  Filled 2020-09-23 (×4): qty 500
  Filled 2020-09-23 (×9): qty 250

## 2020-09-23 MED ORDER — FENTANYL 2500MCG IN NS 250ML (10MCG/ML) PREMIX INFUSION
50.0000 ug/h | INTRAVENOUS | Status: DC
  Administered 2020-09-23: 50 ug/h via INTRAVENOUS
  Administered 2020-09-24: 75 ug/h via INTRAVENOUS
  Administered 2020-09-25 – 2020-09-26 (×2): 150 ug/h via INTRAVENOUS
  Administered 2020-09-27: 100 ug/h via INTRAVENOUS
  Administered 2020-09-28: 75 ug/h via INTRAVENOUS
  Filled 2020-09-23 (×7): qty 250

## 2020-09-23 MED ORDER — EPHEDRINE SULFATE 50 MG/ML IJ SOLN
INTRAMUSCULAR | Status: DC | PRN
  Administered 2020-09-23: 10 mg via INTRAVENOUS

## 2020-09-23 MED ORDER — CALCIUM CHLORIDE 10 % IV SOLN
INTRAVENOUS | Status: AC
  Filled 2020-09-23: qty 10

## 2020-09-23 MED ORDER — PANTOPRAZOLE SODIUM 40 MG PO TBEC: 40.0000 mg | DELAYED_RELEASE_TABLET | Freq: Every day | ORAL | Status: DC

## 2020-09-23 MED ORDER — PROPOFOL 1000 MG/100ML IV EMUL
0.0000 ug/kg/min | INTRAVENOUS | Status: DC
  Administered 2020-09-23 (×3): 30 ug/kg/min via INTRAVENOUS
  Administered 2020-09-24: 40 ug/kg/min via INTRAVENOUS
  Administered 2020-09-24: 30 ug/kg/min via INTRAVENOUS
  Filled 2020-09-23 (×4): qty 100

## 2020-09-23 MED ORDER — ALBUMIN HUMAN 5 % IV SOLN
INTRAVENOUS | Status: AC
  Filled 2020-09-23: qty 500

## 2020-09-23 MED ORDER — SODIUM CHLORIDE 0.9 % IV SOLN
2.0000 g | INTRAVENOUS | Status: AC
  Administered 2020-09-23 – 2020-09-25 (×3): 2 g via INTRAVENOUS
  Filled 2020-09-23: qty 20
  Filled 2020-09-23 (×2): qty 2

## 2020-09-23 MED ORDER — CEFAZOLIN SODIUM-DEXTROSE 2-4 GM/100ML-% IV SOLN: 2.0000 g | Freq: Three times a day (TID) | INTRAVENOUS | Status: DC

## 2020-09-23 MED ORDER — SODIUM CHLORIDE 0.9 % IV SOLN: INTRAVENOUS | Status: DC

## 2020-09-23 MED ORDER — PANTOPRAZOLE SODIUM 40 MG IV SOLR
40.0000 mg | Freq: Every day | INTRAVENOUS | Status: DC
  Administered 2020-09-23 – 2020-10-08 (×16): 40 mg via INTRAVENOUS
  Filled 2020-09-23 (×16): qty 40

## 2020-09-23 MED ORDER — ORAL CARE MOUTH RINSE
15.0000 mL | OROMUCOSAL | Status: DC
  Administered 2020-09-23 – 2020-10-27 (×319): 15 mL via OROMUCOSAL

## 2020-09-23 MED ORDER — CHLORHEXIDINE GLUCONATE 0.12% ORAL RINSE (MEDLINE KIT)
15.0000 mL | Freq: Two times a day (BID) | OROMUCOSAL | Status: DC
  Administered 2020-09-23 – 2020-11-03 (×81): 15 mL via OROMUCOSAL
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
## 2020-09-23 NOTE — OR Nursing (Signed)
Immobilization of Right knee
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
## 2020-09-23 NOTE — Plan of Care (Signed)
  Problem: Clinical Measurements: Goal: Ability to maintain clinical measurements within normal limits will improve Outcome: Progressing   

## 2020-09-23 NOTE — Progress Notes (Signed)
I updated her daughter, [name] [phone]

## 2020-09-23 NOTE — Progress Notes (Signed)
Pt has orders for CTA, however, RN was notified by CT that contrast cannot be given through pt's IJ or hand IV. IV team was consulted and IV RN was unsuccessful even with [place]. CT & trauma MD notified.
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
## 2020-09-23 NOTE — Progress Notes (Signed)
Trauma MD paged again regarding pt's CTA order, awaiting callback.
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
## 2020-09-23 NOTE — Anesthesia Procedure Notes (Signed)
Procedure Name: Intubation Date/Time: 09/22/2020 10:20 PM Performed by: Molli Hazard, CRNA Pre-anesthesia Checklist: Patient identified, Emergency Drugs available, Suction available and Patient being monitored Patient Re-evaluated:Patient Re-evaluated prior to induction Oxygen Delivery Method: Circle system utilized Preoxygenation: Pre-oxygenation with 100% oxygen Induction Type: IV induction, Rapid sequence and Cricoid Pressure applied Laryngoscope Size: Glidescope Grade View: Grade I Tube type: Oral Tube size: 7.5 mm Number of attempts: 1 Airway Equipment and Method: Stylet Secured at: 23 cm Tube secured with: Tape Dental Injury: Teeth and Oropharynx as per pre-operative assessment  Comments: Neck kept neutral during induction and intubation.

## 2020-09-23 NOTE — Progress Notes (Signed)
Orthopedic Tech Progress Note Patient Details:  Sara [name] 1966/03/19 [id number]   Ortho Devices Type of Ortho Device: Sugartong splint Ortho Device/Splint Location: RUE Ortho Device/Splint Interventions: Application   Post Interventions Patient Tolerated: Well   Genelle Bal [name] 09/23/2020, 2:34 AM

## 2020-09-23 NOTE — Progress Notes (Signed)
Pt transported from PACU to 4N21 with no complications.

## 2020-09-23 NOTE — OR Nursing (Signed)
5 Lap left inside of patient
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
## 2020-09-23 NOTE — Transfer of Care (Signed)
Immediate Anesthesia Transfer of Care Note  Patient: Sara [name]  Procedure(s) Performed: EXPLORATORY LAPAROTOMY, Ileocecectomy (N/A ) IRRIGATION AND DEBRIDEMENT LEFT KNEE PLACEMENT OF EXTERNAL FIXATION, (Knee)  Patient Location: PACU  Anesthesia Type:General  Level of Consciousness: Patient remains intubated per anesthesia plan  Airway & Oxygen Therapy: Patient placed on Ventilator (see vital sign flow sheet for setting)  Post-op Assessment: Report given to RN and Post -op Vital signs reviewed and stable  Post vital signs: Reviewed and stable  Last Vitals:  Vitals Value Taken Time  BP 122/34 09/23/20 0217  Temp    Pulse 76 09/23/20 0221  Resp 19 09/23/20 0221  SpO2 100 % 09/23/20 0221  Vitals shown include unvalidated device data.  Last Pain:  Vitals:   09/22/20 2036  TempSrc: Tympanic  PainSc: 10-Worst pain ever         Complications: No complications documented.

## 2020-09-23 NOTE — Progress Notes (Signed)
Pt intubated and sedated.  LLE dressing c/d/i.  RLE KI in place  RUE splinted  NWB x BLE NWB x RUE    - will need definitive Ortho care in the upcoming days.  Will discuss with OTS.  Otherwise will try and get splint to Right ankle for calcaneus fracture.

## 2020-09-23 NOTE — Progress Notes (Signed)
Patient's daughter [name] updated via phone.  Relayed unit, room number, and unit telephone number.  Daughter appreciative of call.

## 2020-09-24 ENCOUNTER — Inpatient Hospital Stay (HOSPITAL_COMMUNITY): Payer: Medicaid Other

## 2020-09-24 ENCOUNTER — Encounter (HOSPITAL_COMMUNITY): Admission: EM | Disposition: A | Discharge status: Rehabilitation Facility | Payer: Self-pay | Source: Home / Self Care

## 2020-09-24 ENCOUNTER — Encounter (HOSPITAL_COMMUNITY): Payer: Self-pay

## 2020-09-24 ENCOUNTER — Inpatient Hospital Stay (HOSPITAL_COMMUNITY): Payer: Medicaid Other | Admitting: Certified Registered Nurse Anesthetist

## 2020-09-24 DIAGNOSIS — D689 Coagulation defect, unspecified: Secondary | ICD-10-CM | POA: Diagnosis not present

## 2020-09-24 DIAGNOSIS — S36892A Contusion of other intra-abdominal organs, initial encounter: Secondary | ICD-10-CM | POA: Diagnosis not present

## 2020-09-24 DIAGNOSIS — S2243XA Multiple fractures of ribs, bilateral, initial encounter for closed fracture: Secondary | ICD-10-CM | POA: Diagnosis not present

## 2020-09-24 DIAGNOSIS — K9189 Other postprocedural complications and disorders of digestive system: Secondary | ICD-10-CM | POA: Diagnosis not present

## 2020-09-24 DIAGNOSIS — K659 Peritonitis, unspecified: Secondary | ICD-10-CM | POA: Diagnosis not present

## 2020-09-24 DIAGNOSIS — S52501A Unspecified fracture of the lower end of right radius, initial encounter for closed fracture: Secondary | ICD-10-CM | POA: Diagnosis not present

## 2020-09-24 DIAGNOSIS — Z23 Encounter for immunization: Secondary | ICD-10-CM | POA: Diagnosis not present

## 2020-09-24 DIAGNOSIS — S72462C Displaced supracondylar fracture with intracondylar extension of lower end of left femur, initial encounter for open fracture type IIIA, IIIB, or IIIC: Secondary | ICD-10-CM | POA: Diagnosis not present

## 2020-09-24 DIAGNOSIS — S82142A Displaced bicondylar fracture of left tibia, initial encounter for closed fracture: Secondary | ICD-10-CM | POA: Diagnosis not present

## 2020-09-24 DIAGNOSIS — K469 Unspecified abdominal hernia without obstruction or gangrene: Secondary | ICD-10-CM | POA: Diagnosis not present

## 2020-09-24 DIAGNOSIS — I959 Hypotension, unspecified: Secondary | ICD-10-CM | POA: Diagnosis not present

## 2020-09-24 DIAGNOSIS — J869 Pyothorax without fistula: Secondary | ICD-10-CM | POA: Diagnosis not present

## 2020-09-24 DIAGNOSIS — J80 Acute respiratory distress syndrome: Secondary | ICD-10-CM | POA: Diagnosis not present

## 2020-09-24 DIAGNOSIS — Z20822 Contact with and (suspected) exposure to covid-19: Secondary | ICD-10-CM | POA: Diagnosis not present

## 2020-09-24 LAB — CBC
HCT: 23.1 % — ABNORMAL LOW (ref 36.0–46.0)
HCT: 30.2 % — ABNORMAL LOW (ref 36.0–46.0)
Hemoglobin: 7.3 g/dL — ABNORMAL LOW (ref 12.0–15.0)
Hemoglobin: 9.4 g/dL — ABNORMAL LOW (ref 12.0–15.0)
MCH: 31.1 pg (ref 26.0–34.0)
MCH: 29.7 pg (ref 26.0–34.0)
MCHC: 31.6 g/dL (ref 30.0–36.0)
MCHC: 31.1 g/dL (ref 30.0–36.0)
MCV: 98.3 fL (ref 80.0–100.0)
MCV: 95.6 fL (ref 80.0–100.0)
Platelets: 82 10*3/uL — ABNORMAL LOW (ref 150–400)
Platelets: 61 10*3/uL — ABNORMAL LOW (ref 150–400)
RBC: 2.35 MIL/uL — ABNORMAL LOW (ref 3.87–5.11)
RBC: 3.16 MIL/uL — ABNORMAL LOW (ref 3.87–5.11)
RDW: 16.5 % — ABNORMAL HIGH (ref 11.5–15.5)
RDW: 16.1 % — ABNORMAL HIGH (ref 11.5–15.5)
WBC: 10.6 10*3/uL — ABNORMAL HIGH (ref 4.0–10.5)
WBC: 5.9 10*3/uL (ref 4.0–10.5)
nRBC: 0 % (ref 0.0–0.2)
nRBC: 0 % (ref 0.0–0.2)

## 2020-09-24 LAB — BASIC METABOLIC PANEL
Anion gap: 10 (ref 5–15)
Anion gap: 6 (ref 5–15)
BUN: 20 mg/dL (ref 6–20)
BUN: 18 mg/dL (ref 6–20)
CO2: 20 mmol/L — ABNORMAL LOW (ref 22–32)
CO2: 20 mmol/L — ABNORMAL LOW (ref 22–32)
Calcium: 6.8 mg/dL — ABNORMAL LOW (ref 8.9–10.3)
Calcium: 6.1 mg/dL — CL (ref 8.9–10.3)
Chloride: 114 mmol/L — ABNORMAL HIGH (ref 98–111)
Chloride: 117 mmol/L — ABNORMAL HIGH (ref 98–111)
Creatinine, Ser: 1.3 mg/dL — ABNORMAL HIGH (ref 0.44–1.00)
Creatinine, Ser: 0.95 mg/dL (ref 0.44–1.00)
GFR calc Af Amer: 55 mL/min — ABNORMAL LOW (ref >60)
GFR calc Af Amer: >60 mL/min (ref >60)
GFR calc non Af Amer: 47 mL/min — ABNORMAL LOW (ref >60)
GFR calc non Af Amer: >60 mL/min (ref >60)
Glucose, Bld: 135 mg/dL — ABNORMAL HIGH (ref 70–99)
Glucose, Bld: 134 mg/dL — ABNORMAL HIGH (ref 70–99)
Potassium: 4.8 mmol/L (ref 3.5–5.1)
Potassium: 4.4 mmol/L (ref 3.5–5.1)
Sodium: 144 mmol/L (ref 135–145)
Sodium: 143 mmol/L (ref 135–145)

## 2020-09-24 LAB — COMPREHENSIVE METABOLIC PANEL
ALT: 33 U/L (ref 0–44)
AST: 71 U/L — ABNORMAL HIGH (ref 15–41)
Albumin: 2.2 g/dL — ABNORMAL LOW (ref 3.5–5.0)
Alkaline Phosphatase: 34 U/L — ABNORMAL LOW (ref 38–126)
Anion gap: 10 (ref 5–15)
BUN: 21 mg/dL — ABNORMAL HIGH (ref 6–20)
CO2: 19 mmol/L — ABNORMAL LOW (ref 22–32)
Calcium: 6.6 mg/dL — ABNORMAL LOW (ref 8.9–10.3)
Chloride: 115 mmol/L — ABNORMAL HIGH (ref 98–111)
Creatinine, Ser: 1.39 mg/dL — ABNORMAL HIGH (ref 0.44–1.00)
GFR calc Af Amer: 50 mL/min — ABNORMAL LOW (ref >60)
GFR calc non Af Amer: 43 mL/min — ABNORMAL LOW (ref >60)
Glucose, Bld: 129 mg/dL — ABNORMAL HIGH (ref 70–99)
Potassium: 4.7 mmol/L (ref 3.5–5.1)
Sodium: 144 mmol/L (ref 135–145)
Total Bilirubin: 0.4 mg/dL (ref 0.3–1.2)
Total Protein: 4.2 g/dL — ABNORMAL LOW (ref 6.5–8.1)

## 2020-09-24 LAB — ECHOCARDIOGRAM COMPLETE
Area-P 1/2: 3.48 cm2
Height: 65 in
S' Lateral: 2.2 cm
Weight: 2645.52 oz

## 2020-09-24 LAB — SURGICAL PCR SCREEN
MRSA, PCR: NEGATIVE
Staphylococcus aureus: NEGATIVE

## 2020-09-24 LAB — TRIGLYCERIDES: Triglycerides: 270 mg/dL — ABNORMAL HIGH (ref <150)

## 2020-09-24 SURGERY — LAPAROTOMY, EXPLORATORY
Anesthesia: General | Site: Abdomen

## 2020-09-24 MED ORDER — ALBUMIN HUMAN 5 % IV SOLN: INTRAVENOUS | Status: DC | PRN

## 2020-09-24 MED ORDER — SODIUM CHLORIDE 0.9% IV SOLUTION: Freq: Once | INTRAVENOUS | Status: DC

## 2020-09-24 MED ORDER — ROCURONIUM BROMIDE 10 MG/ML (PF) SYRINGE
PREFILLED_SYRINGE | INTRAVENOUS | Status: DC | PRN
  Administered 2020-09-24: 40 mg via INTRAVENOUS
  Administered 2020-09-24: 50 mg via INTRAVENOUS
  Administered 2020-09-24 (×2): 60 mg via INTRAVENOUS

## 2020-09-24 MED ORDER — CALCIUM GLUCONATE-NACL 1-0.675 GM/50ML-% IV SOLN
1.0000 g | Freq: Once | INTRAVENOUS | Status: AC
  Administered 2020-09-24: 1000 mg via INTRAVENOUS
  Filled 2020-09-24: qty 50

## 2020-09-24 MED ORDER — MIDAZOLAM HCL 2 MG/2ML IJ SOLN
INTRAMUSCULAR | Status: AC
  Filled 2020-09-24: qty 2

## 2020-09-24 MED ORDER — DEXMEDETOMIDINE HCL IN NACL 400 MCG/100ML IV SOLN
0.4000 ug/kg/h | INTRAVENOUS | Status: DC
  Administered 2020-09-24 (×2): 0.5 ug/kg/h via INTRAVENOUS
  Administered 2020-09-25: 0.8 ug/kg/h via INTRAVENOUS
  Administered 2020-09-25: 0.5 ug/kg/h via INTRAVENOUS
  Administered 2020-09-25 – 2020-09-26 (×2): 0.6 ug/kg/h via INTRAVENOUS
  Administered 2020-09-27 – 2020-09-28 (×3): 0.4 ug/kg/h via INTRAVENOUS
  Administered 2020-09-28: 0.8 ug/kg/h via INTRAVENOUS
  Administered 2020-09-29 (×2): 0.4 ug/kg/h via INTRAVENOUS
  Administered 2020-09-30 (×2): 0.6 ug/kg/h via INTRAVENOUS
  Administered 2020-10-01: 0.4 ug/kg/h via INTRAVENOUS
  Administered 2020-10-01: 0.6 ug/kg/h via INTRAVENOUS
  Administered 2020-10-02: 0.4 ug/kg/h via INTRAVENOUS
  Administered 2020-10-02: 0.8 ug/kg/h via INTRAVENOUS
  Administered 2020-10-02: 0.6 ug/kg/h via INTRAVENOUS
  Administered 2020-10-03 (×3): 0.8 ug/kg/h via INTRAVENOUS
  Administered 2020-10-04: 0.4 ug/kg/h via INTRAVENOUS
  Administered 2020-10-04 (×2): 0.8 ug/kg/h via INTRAVENOUS
  Filled 2020-09-24 (×25): qty 100

## 2020-09-24 MED ORDER — SODIUM CHLORIDE 0.9 % IV BOLUS
1000.0000 mL | Freq: Once | INTRAVENOUS | Status: AC
  Administered 2020-09-24: 1000 mL via INTRAVENOUS

## 2020-09-24 MED ORDER — FENTANYL CITRATE (PF) 100 MCG/2ML IJ SOLN
INTRAMUSCULAR | Status: DC | PRN
Start: 2020-09-24 — End: 2020-09-24
  Administered 2020-09-24: 150 ug via INTRAVENOUS

## 2020-09-24 MED ORDER — PHENYLEPHRINE HCL (PRESSORS) 10 MG/ML IV SOLN
INTRAVENOUS | Status: AC
  Filled 2020-09-24: qty 1

## 2020-09-24 MED ORDER — DEXMEDETOMIDINE HCL IN NACL 400 MCG/100ML IV SOLN
0.4000 ug/kg/h | INTRAVENOUS | Status: AC
  Administered 2020-09-24: .2 ug/kg/h via INTRAVENOUS
  Filled 2020-09-24: qty 100

## 2020-09-24 MED ORDER — ROCURONIUM BROMIDE 10 MG/ML (PF) SYRINGE
PREFILLED_SYRINGE | INTRAVENOUS | Status: AC
  Filled 2020-09-24: qty 10

## 2020-09-24 MED ORDER — NOREPINEPHRINE 4 MG/250ML-% IV SOLN
INTRAVENOUS | Status: AC
  Filled 2020-09-24: qty 250

## 2020-09-24 MED ORDER — NOREPINEPHRINE 4 MG/250ML-% IV SOLN
0.0000 ug/min | INTRAVENOUS | Status: DC
  Administered 2020-09-24: 16 ug/min via INTRAVENOUS
  Administered 2020-09-25: 13 ug/min via INTRAVENOUS
  Administered 2020-09-25: 6 ug/min via INTRAVENOUS
  Administered 2020-09-25 (×2): 17 ug/min via INTRAVENOUS
  Administered 2020-09-26: 14 ug/min via INTRAVENOUS
  Administered 2020-09-26: 22 ug/min via INTRAVENOUS
  Administered 2020-09-26: 16 ug/min via INTRAVENOUS
  Administered 2020-09-27 (×2): 20 ug/min via INTRAVENOUS
  Administered 2020-09-27: 16 ug/min via INTRAVENOUS
  Administered 2020-09-27: 18 ug/min via INTRAVENOUS
  Administered 2020-09-27: 12 ug/min via INTRAVENOUS
  Administered 2020-09-27 – 2020-09-28 (×2): 16 ug/min via INTRAVENOUS
  Administered 2020-09-28: 14 ug/min via INTRAVENOUS
  Administered 2020-09-28: 3 ug/min via INTRAVENOUS
  Filled 2020-09-24 (×17): qty 250

## 2020-09-24 MED ORDER — PROPOFOL 10 MG/ML IV BOLUS
INTRAVENOUS | Status: AC
  Filled 2020-09-24: qty 20

## 2020-09-24 MED ORDER — LIDOCAINE 2% (20 MG/ML) 5 ML SYRINGE
INTRAMUSCULAR | Status: AC
  Filled 2020-09-24: qty 5

## 2020-09-24 MED ORDER — SODIUM CHLORIDE 0.9 % IV SOLN: INTRAVENOUS | Status: DC | PRN

## 2020-09-24 MED ORDER — MIDAZOLAM HCL 5 MG/5ML IJ SOLN
INTRAMUSCULAR | Status: DC | PRN
  Administered 2020-09-24: 2 mg via INTRAVENOUS

## 2020-09-24 MED ORDER — FENTANYL CITRATE (PF) 250 MCG/5ML IJ SOLN
INTRAMUSCULAR | Status: AC
Start: 2020-09-24 — End: ?
  Filled 2020-09-24: qty 5

## 2020-09-24 MED ORDER — NOREPINEPHRINE 4 MG/250ML-% IV SOLN
0.0000 ug/min | INTRAVENOUS | Status: AC
  Administered 2020-09-24: 4 ug/min via INTRAVENOUS
  Filled 2020-09-24: qty 250

## 2020-09-24 SURGICAL SUPPLY — 95 items
ADH SKN CLS APL DERMABOND .7 (GAUZE/BANDAGES/DRESSINGS)
APL PRP STRL LF DISP 70% ISPRP (MISCELLANEOUS) ×3
APL SKNCLS STERI-STRIP NONHPOA (GAUZE/BANDAGES/DRESSINGS)
BATTERY PACK STR FOR DRIVER (MISCELLANEOUS) ×4 IMPLANT
BENZOIN TINCTURE PRP APPL 2/3 (GAUZE/BANDAGES/DRESSINGS) IMPLANT
BLADE CLIPPER SURG (BLADE) ×4 IMPLANT
BNDG COHESIVE 4X5 TAN STRL (GAUZE/BANDAGES/DRESSINGS) ×4 IMPLANT
CANISTER SUCT 3000ML PPV (MISCELLANEOUS) ×8 IMPLANT
CATH HYDRAGLIDE XL THORACIC (CATHETERS) IMPLANT
CATH ROBINSON RED A/P 14FR (CATHETERS) ×4 IMPLANT
CATH THORACIC 28FR (CATHETERS) IMPLANT
CATH THORACIC 28FR RT ANG (CATHETERS) IMPLANT
CATH THORACIC 36FR (CATHETERS) IMPLANT
CATH THORACIC 36FR RT ANG (CATHETERS) IMPLANT
CHLORAPREP W/TINT 26 (MISCELLANEOUS) ×4 IMPLANT
CLIP VESOCCLUDE MED 6/CT (CLIP) ×4 IMPLANT
CNTNR URN SCR LID CUP LEK RST (MISCELLANEOUS) IMPLANT
CONT SPEC 4OZ STRL OR WHT (MISCELLANEOUS)
COVER SURGICAL LIGHT HANDLE (MISCELLANEOUS) ×12 IMPLANT
COVER WAND RF STERILE (DRAPES) ×4 IMPLANT
DECANTER SPIKE VIAL GLASS SM (MISCELLANEOUS) ×4 IMPLANT
DERMABOND ADVANCED (GAUZE/BANDAGES/DRESSINGS)
DERMABOND ADVANCED .7 DNX12 (GAUZE/BANDAGES/DRESSINGS) IMPLANT
DRAIN CHANNEL 19F RND (DRAIN) ×8 IMPLANT
DRAPE LAPAROSCOPIC ABDOMINAL (DRAPES) ×8 IMPLANT
DRAPE WARM FLUID 44X44 (DRAPES) ×8 IMPLANT
DRSG AQUACEL AG ADV 3.5X14 (GAUZE/BANDAGES/DRESSINGS) ×4 IMPLANT
DRSG OPSITE POSTOP 4X10 (GAUZE/BANDAGES/DRESSINGS) IMPLANT
DRSG OPSITE POSTOP 4X8 (GAUZE/BANDAGES/DRESSINGS) IMPLANT
ELECT BLADE 6.5 EXT (BLADE) IMPLANT
ELECT CAUTERY BLADE 6.4 (BLADE) ×8 IMPLANT
ELECT REM PT RETURN 9FT ADLT (ELECTROSURGICAL) ×8
ELECTRODE REM PT RTRN 9FT ADLT (ELECTROSURGICAL) ×6 IMPLANT
EVACUATOR SILICONE 100CC (DRAIN) ×8 IMPLANT
FILTER SMOKE EVAC ULPA (FILTER) ×4 IMPLANT
GAUZE SPONGE 4X4 12PLY STRL (GAUZE/BANDAGES/DRESSINGS) ×4 IMPLANT
GLOVE BIO SURGEON STRL SZ 6 (GLOVE) ×8 IMPLANT
GLOVE BIOGEL PI IND STRL 6.5 (GLOVE) ×6 IMPLANT
GLOVE BIOGEL PI INDICATOR 6.5 (GLOVE) ×2
GLOVE INDICATOR 6.5 STRL GRN (GLOVE) ×4 IMPLANT
GOWN BRE IMP PREV XXLGXLNG (GOWN DISPOSABLE) ×4 IMPLANT
GOWN STRL REUS W/ TWL LRG LVL3 (GOWN DISPOSABLE) ×12 IMPLANT
GOWN STRL REUS W/TWL 2XL LVL3 (GOWN DISPOSABLE) ×4 IMPLANT
GOWN STRL REUS W/TWL LRG LVL3 (GOWN DISPOSABLE) ×16
HANDLE SUCTION POOLE (INSTRUMENTS) ×3 IMPLANT
HEMOSTAT POWDER SURGIFOAM 1G (HEMOSTASIS) IMPLANT
KIT BASIN OR (CUSTOM PROCEDURE TRAY) ×8 IMPLANT
KIT OSTOMY DRAINABLE 2.75 STR (WOUND CARE) ×4 IMPLANT
KIT TURNOVER KIT B (KITS) ×8 IMPLANT
LIGASURE IMPACT 36 18CM CVD LR (INSTRUMENTS) IMPLANT
NS IRRIG 1000ML POUR BTL (IV SOLUTION) ×16 IMPLANT
PACK CHEST (CUSTOM PROCEDURE TRAY) ×4 IMPLANT
PACK GENERAL/GYN (CUSTOM PROCEDURE TRAY) ×4 IMPLANT
PAD ARMBOARD 7.5X6 YLW CONV (MISCELLANEOUS) ×12 IMPLANT
PENCIL SMOKE EVACUATOR (MISCELLANEOUS) ×8 IMPLANT
SLEEVE SUCTION 125 (MISCELLANEOUS) ×4 IMPLANT
SPECIMEN JAR LARGE (MISCELLANEOUS) ×4 IMPLANT
SPONGE LAP 18X18 RF (DISPOSABLE) IMPLANT
STAPLER VISISTAT 35W (STAPLE) ×8 IMPLANT
SUCTION POOLE HANDLE (INSTRUMENTS) ×4
SUT DVC VLOC 180 0 12IN GS21 (SUTURE) ×4
SUT ETHILON 2 0 FS 18 (SUTURE) ×4 IMPLANT
SUT ETHILON 2 LR (SUTURE) ×16 IMPLANT
SUT FIBERWIRE #2 38 T-5 BLUE (SUTURE)
SUT FIBERWIRE #5 38 CONV NDL (SUTURE)
SUT PDS AB 1 TP1 96 (SUTURE) ×12 IMPLANT
SUT SILK  1 MH (SUTURE)
SUT SILK 1 MH (SUTURE) IMPLANT
SUT SILK 2 0 SH CR/8 (SUTURE) ×4 IMPLANT
SUT SILK 2 0 TIES 10X30 (SUTURE) ×4 IMPLANT
SUT SILK 3 0 SH CR/8 (SUTURE) ×4 IMPLANT
SUT SILK 3 0 TIES 10X30 (SUTURE) ×4 IMPLANT
SUT VIC AB 1 CTX 18 (SUTURE) ×4 IMPLANT
SUT VIC AB 1 CTX 36 (SUTURE)
SUT VIC AB 1 CTX36XBRD ANBCTR (SUTURE) IMPLANT
SUT VIC AB 2-0 CTX 36 (SUTURE) ×4 IMPLANT
SUT VIC AB 2-0 SH 18 (SUTURE) ×4 IMPLANT
SUT VIC AB 3-0 SH 18 (SUTURE) ×4 IMPLANT
SUT VIC AB 3-0 X1 27 (SUTURE) ×4 IMPLANT
SUT VICRYL 2 TP 1 (SUTURE) IMPLANT
SUT VICRYL AB 2 0 TIES (SUTURE) ×4 IMPLANT
SUT VICRYL AB 3 0 TIES (SUTURE) ×4 IMPLANT
SUTURE DVC VLC 180 0 12IN GS21 (SUTURE) ×3 IMPLANT
SUTURE FIBERWR #2 38 T-5 BLUE (SUTURE) IMPLANT
SUTURE FIBERWR #5 38 CONV NDL (SUTURE) IMPLANT
SYSTEM SAHARA CHEST DRAIN ATS (WOUND CARE) ×4 IMPLANT
TAPE CLOTH 4X10 WHT NS (GAUZE/BANDAGES/DRESSINGS) ×4 IMPLANT
TOWEL GREEN STERILE (TOWEL DISPOSABLE) ×8 IMPLANT
TOWEL GREEN STERILE FF (TOWEL DISPOSABLE) ×4 IMPLANT
TRAP FLUID SMOKE EVACUATOR (MISCELLANEOUS) ×4 IMPLANT
TRAY FOLEY MTR SLVR 14FR STAT (SET/KITS/TRAYS/PACK) IMPLANT
TRAY FOLEY MTR SLVR 16FR STAT (SET/KITS/TRAYS/PACK) IMPLANT
TUNNELER SHEATH ON-Q 11GX8 DSP (PAIN MANAGEMENT) IMPLANT
WATER STERILE IRR 1000ML POUR (IV SOLUTION) ×8 IMPLANT
YANKAUER SUCT BULB TIP NO VENT (SUCTIONS) ×4 IMPLANT
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
## 2020-09-24 NOTE — Anesthesia Postprocedure Evaluation (Signed)
Anesthesia Post Note  Patient: Sara [name]  Procedure(s) Performed: EXPLORATORY LAPAROTOMY, Ileocecectomy (N/A ) IRRIGATION AND DEBRIDEMENT LEFT KNEE PLACEMENT OF EXTERNAL FIXATION, (Knee)     Patient location during evaluation: SICU Anesthesia Type: General Level of consciousness: patient remains intubated per anesthesia plan Pain management: pain level controlled Vital Signs Assessment: post-procedure vital signs reviewed and stable Respiratory status: patient remains intubated per anesthesia plan Cardiovascular status: stable Postop Assessment: no apparent nausea or vomiting Anesthetic complications: no   No complications documented.  Last Vitals:  Vitals:   09/24/20 0500 09/24/20 0530  BP: (!) 89/57 (!) 94/53  Pulse: 95 96  Resp: (!) 21 17  Temp:    SpO2: 97% 98%    Last Pain:  Vitals:   09/24/20 0000  TempSrc: Oral  PainSc:                  Sara [name]

## 2020-09-24 NOTE — Transfer of Care (Signed)
Immediate Anesthesia Transfer of Care Note  Patient: Sara [name]  Procedure(s) Performed: EXPLORATORY LAPAROTOMY OSTOMY  REPAIR FLANK HERNIA (N/A ) STABILIZATION OF RIB FRACTURES (Left )  Patient Location: ICU  Anesthesia Type:General  Level of Consciousness: Patient remains intubated per anesthesia plan  Airway & Oxygen Therapy: Patient remains intubated per anesthesia plan, Patient placed on Ventilator (see vital sign flow sheet for setting) and receiving RN @ bedside  Post-op Assessment: Report given to RN and Post -op Vital signs reviewed and stable  Post vital signs: Reviewed  Last Vitals:  Vitals Value Taken Time  BP    Temp    Pulse    Resp    SpO2      Last Pain:  Vitals:   09/24/20 0000  TempSrc: Oral  PainSc:          Complications: No complications documented.

## 2020-09-24 NOTE — Anesthesia Preprocedure Evaluation (Addendum)
Anesthesia Evaluation  Patient identified by MRN, date of birth, ID band Patient unresponsive  Preop documentation limited or incomplete due to emergent nature of procedure.  Airway Mallampati: Intubated       Dental   Pulmonary           Cardiovascular      Neuro/Psych    GI/Hepatic   Endo/Other    Renal/GU      Musculoskeletal   Abdominal   Peds  Hematology   Anesthesia Other Findings   Reproductive/Obstetrics                           Anesthesia Physical Anesthesia Plan  ASA: IV  Anesthesia Plan: General   Post-op Pain Management:    Induction: Intravenous  PONV Risk Score and Plan: 3 and Ondansetron  Airway Management Planned: Oral ETT  Additional Equipment:   Intra-op Plan:   Post-operative Plan: Post-operative intubation/ventilation  Informed Consent: I have reviewed the patients History and Physical, chart, labs and discussed the procedure including the risks, benefits and alternatives for the proposed anesthesia with the patient or authorized representative who has indicated his/her understanding and acceptance.       Plan Discussed with: CRNA, Anesthesiologist and Surgeon  Anesthesia Plan Comments:        Anesthesia Quick Evaluation
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
## 2020-09-24 NOTE — Progress Notes (Signed)
Patient ID: Sara [name], female   DOB: 08/21/1966, 54 y.o.   MRN: [id number] Subjective: 2 Days Post-Op Procedure(s) (LRB): EXPLORATORY LAPAROTOMY, Ileocecectomy (N/A) IRRIGATION AND DEBRIDEMENT LEFT KNEE PLACEMENT OF EXTERNAL FIXATION,   Multiple significant intra-abdominal injuries in addition to bilateral lower extremity injuries Patient intubated, sedated    Objective:   VITALS:   Vitals:   09/24/20 0500 09/24/20 0530  BP: (!) 89/57 (!) 94/53  Pulse: 95 96  Resp: (!) 21 17  Temp:    SpO2: 97% 98%    Exam: Intubated LLE in spanning external fixator with mild bloody drainage at pin sites RLE in knee immobilizer  LABS Recent Labs    09/22/20 2101 09/22/20 2106 09/23/20 0330 09/23/20 0330 09/23/20 0713 09/23/20 2131 09/23/20 2143  HGB 13.2   < > 9.4*   < > 10.5* 9.2* 7.5*  HCT 41.9   < > 29.3*   < > 32.1* 27.0* 22.0*  WBC 15.7*  --  9.1  --  10.0  --   --   PLT 240  --  96*  --  95*  --   --    < > = values in this interval not displayed.    Recent Labs    09/23/20 0330 09/23/20 2131 09/23/20 2143 09/23/20 2318 09/24/20 0421  NA 145   < > 144 144 144  K 3.6   < > 4.4 4.8 4.7  BUN 16  --   --  20 21*  CREATININE 1.05*  --   --  1.30* 1.39*  GLUCOSE 137*  --   --  135* 129*   < > = values in this interval not displayed.    Recent Labs    09/22/20 2101  INR 1.1     Assessment/Plan: 2 Days Post-Op Procedure(s) (LRB): EXPLORATORY LAPAROTOMY, Ileocecectomy (N/A) IRRIGATION AND DEBRIDEMENT LEFT KNEE PLACEMENT OF EXTERNAL FIXATION,  RIGHT distal femur fracture, closed RIGHT calcaneous fracture - will order at least posterior splint to be applied to protect during procedures and bed mobility until definitive treatment determined LEFT open proximal tibia fracture LEFT open distal femur fracture  Plan: Multiple significant injuries To OR today with GSU for repeat abdominal exploration and treatment Ortho Trauma to address her multiple LE injuries  this week

## 2020-09-24 NOTE — Anesthesia Postprocedure Evaluation (Signed)
Anesthesia Post Note  Patient: Sara [name]  Procedure(s) Performed: EXPLORATORY LAPAROTOMY COLOSTOMY  AND REPAIR  FLANK HERNIA (N/A Abdomen)     Patient location during evaluation: PACU Anesthesia Type: General Level of consciousness: awake Pain management: pain level controlled Vital Signs Assessment: post-procedure vital signs reviewed and stable Respiratory status: spontaneous breathing Cardiovascular status: stable Postop Assessment: no apparent nausea or vomiting Anesthetic complications: no   No complications documented.  Last Vitals:  Vitals:   09/24/20 1435 09/24/20 1500  BP: 108/65 (!) 83/50  Pulse: 73 73  Resp: 20 19  Temp:    SpO2: 91% 92%    Last Pain:  Vitals:   09/24/20 1200  TempSrc: Axillary  PainSc:                  Maevyn Riordan

## 2020-09-24 NOTE — Anesthesia Procedure Notes (Signed)
Procedure Name: General with mask airway Date/Time: 09/24/2020 8:40 AM Performed by: Lovie Chol, CRNA Pre-anesthesia Checklist: Patient identified, Emergency Drugs available, Suction available and Patient being monitored Patient Re-evaluated:Patient Re-evaluated prior to induction Oxygen Delivery Method: Circle system utilized Preoxygenation: Pre-oxygenation with 100% oxygen Induction Type: Inhalational induction with existing ETT

## 2020-09-24 NOTE — Progress Notes (Signed)
RT transported pt while on ventilator along with RN and transport. Pt tolerated well along with SVS.

## 2020-09-24 NOTE — Progress Notes (Addendum)
Pre Procedure note for inpatients:   Sara [name] has been scheduled for Left rib stabilization today. The various methods of treatment have been discussed with the patient. After consideration of the risks, benefits and treatment options the patient has consented to the planned procedure.  CXR today reviewed showing no change, lungs clear  The patient has been seen and labs reviewed. There are no changes in the patient's condition to prevent proceeding with the planned procedure today. Procedure discussed with patients daughter by phone    Sara [name] [phone] Recent labs:  Lab Results  Component Value Date   WBC 10.0 09/23/2020   HGB 7.5 (L) 09/23/2020   HCT 22.0 (L) 09/23/2020   PLT 95 (L) 09/23/2020   GLUCOSE 129 (H) 09/24/2020   TRIG 270 (H) 09/24/2020   ALT 33 09/24/2020   AST 71 (H) 09/24/2020   NA 144 09/24/2020   K 4.7 09/24/2020   CL 115 (H) 09/24/2020   CREATININE 1.39 (H) 09/24/2020   BUN 21 (H) 09/24/2020   CO2 19 (L) 09/24/2020   INR 1.1 09/22/2020    Mikey Bussing, MD 09/24/2020 8:22 AM

## 2020-09-24 NOTE — Progress Notes (Signed)
Orthopedic Tech Progress Note Patient Details:  Jovanka Westgate Hain 10-05-1966 [id number]  Ortho Devices Type of Ortho Device: Ace wrap, Post (short leg) splint Ortho Device/Splint Location: RLE Ortho Device/Splint Interventions: Ordered, Application   Post Interventions Patient Tolerated: Well Instructions Provided: Care of device   Jennye Moccasin 09/24/2020, 4:57 PM

## 2020-09-24 NOTE — Progress Notes (Signed)
Day of Surgery Procedure(s) (LRB): EXPLORATORY LAPAROTOMY OSTOMY  REPAIR FLANK HERNIA (N/A) STABILIZATION OF RIB FRACTURES (Left) Subjective: EKG, troponin donot indicate myocardial contusion Intra operative repair of L subdiaphragmatic abdominal wall hernia performed by Dr Fredricka Bonine and lower rib stabilization not necessary and not indicated because of high pressor requirements  in OR  Objective: Vital signs in last 24 hours: Temp:  [98.5 F (36.9 C)-100.8 F (38.2 C)] 100.8 F (38.2 C) (09/26 0000) Pulse Rate:  [88-118] 96 (09/26 0530) Cardiac Rhythm: Sinus tachycardia (09/25 2000) Resp:  [13-27] 17 (09/26 0530) BP: (77-104)/(51-71) 94/53 (09/26 0530) SpO2:  [94 %-100 %] 98 % (09/26 0530) FiO2 (%):  [40 %-50 %] 40 % (09/26 0227)  Hemodynamic parameters for last 24 hours:  stable  Intake/Output from previous day: 09/25 0701 - 09/26 0700 In: 3980.1 [I.V.:3310.8; IV Piggyback:669.2] Out: 1425 [Urine:750; Drains:675] Intake/Output this shift: Total I/O In: 1880 [I.V.:1000; Blood:630; IV Piggyback:250] Out: 350 [Urine:200; Other:100; Blood:50]    Lab Results: Recent Labs    09/23/20 0713 09/23/20 2131 09/23/20 2143 09/24/20 0748  WBC 10.0  --   --  10.6*  HGB 10.5*   < > 7.5* 7.3*  HCT 32.1*   < > 22.0* 23.1*  PLT 95*  --   --  82*   < > = values in this interval not displayed.   BMET:  Recent Labs    09/23/20 2318 09/24/20 0421  NA 144 144  K 4.8 4.7  CL 114* 115*  CO2 20* 19*  GLUCOSE 135* 129*  BUN 20 21*  CREATININE 1.30* 1.39*  CALCIUM 6.8* 6.6*    PT/INR:  Recent Labs    09/22/20 2101  LABPROT 13.8  INR 1.1   ABG    Component Value Date/Time   PHART 7.372 09/23/2020 2143   HCO3 20.9 09/23/2020 2143   TCO2 22 09/23/2020 2143   ACIDBASEDEF 4.0 (H) 09/23/2020 2143   O2SAT 94.0 09/23/2020 2143   CBG (last 3)  No results for input(s): GLUCAP in the last 72 hours.  Assessment/Plan: S/P Procedure(s) (LRB): EXPLORATORY LAPAROTOMY OSTOMY   REPAIR FLANK HERNIA (N/A) STABILIZATION OF RIB FRACTURES (Left) Follow CXR postop   LOS: 2 days    Sara [name] 09/24/2020

## 2020-09-24 NOTE — OR Nursing (Signed)
Spoke with Dr. Benson Norway, Radiologist he stated he does see a clamp. Dr. Fredricka Bonine contacted and is aware the clamp was there as this is a look for sponge only. Clamp was external to the patient. No sponges retained per Dr. Benson Norway, Radiologist.

## 2020-09-24 NOTE — Progress Notes (Signed)
Pt arrived back to 4N 21. Pt currently being bagged by anesthesia. Pt placed back on previous vent settings, ETT remains secure at 23cm at the lip. RT will continue to monitor.
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
## 2020-09-24 NOTE — Progress Notes (Signed)
  Echocardiogram 2D Echocardiogram has been performed.  Sara [name] 09/24/2020, 2:18 PM

## 2020-09-24 NOTE — Consult Note (Signed)
WOC Nurse ostomy consult note Consultation received from Dr. Fredricka Bonine for post op ostomy management and when appropriate, teaching to patient and/or family. Will see either Monday or Tuesday, 9/27, or 9/28 for initial assessment of LUQ colostomy and peristomal skin.  WOC nursing team will follow, and will remain available to this patient, the nursing, surgical and medical teams.   Thanks, Ladona Mow, MSN, RN, GNP, [place]  Pager# [phone]

## 2020-09-24 NOTE — Progress Notes (Signed)
RT placed orange bite block on ETT due to patient biting down on ETT. RT increased Fio2 to 60% due to pt's Spo2 decreasing to 87%. Spo2 is now 94%. RT will wean as tolerated.

## 2020-09-25 ENCOUNTER — Encounter (HOSPITAL_COMMUNITY): Payer: Self-pay | Admitting: Surgery

## 2020-09-25 ENCOUNTER — Inpatient Hospital Stay (HOSPITAL_COMMUNITY): Payer: Medicaid Other

## 2020-09-25 DIAGNOSIS — S72461A Displaced supracondylar fracture with intracondylar extension of lower end of right femur, initial encounter for closed fracture: Secondary | ICD-10-CM | POA: Diagnosis not present

## 2020-09-25 DIAGNOSIS — S2243XA Multiple fractures of ribs, bilateral, initial encounter for closed fracture: Secondary | ICD-10-CM | POA: Diagnosis not present

## 2020-09-25 DIAGNOSIS — J9600 Acute respiratory failure, unspecified whether with hypoxia or hypercapnia: Secondary | ICD-10-CM | POA: Diagnosis not present

## 2020-09-25 DIAGNOSIS — S92061A Displaced intraarticular fracture of right calcaneus, initial encounter for closed fracture: Secondary | ICD-10-CM | POA: Diagnosis not present

## 2020-09-25 DIAGNOSIS — D62 Acute posthemorrhagic anemia: Secondary | ICD-10-CM | POA: Diagnosis not present

## 2020-09-25 DIAGNOSIS — S270XXA Traumatic pneumothorax, initial encounter: Secondary | ICD-10-CM | POA: Diagnosis not present

## 2020-09-25 DIAGNOSIS — S82142A Displaced bicondylar fracture of left tibia, initial encounter for closed fracture: Secondary | ICD-10-CM | POA: Diagnosis not present

## 2020-09-25 DIAGNOSIS — S72452C Displaced supracondylar fracture without intracondylar extension of lower end of left femur, initial encounter for open fracture type IIIA, IIIB, or IIIC: Secondary | ICD-10-CM | POA: Diagnosis not present

## 2020-09-25 DIAGNOSIS — N179 Acute kidney failure, unspecified: Secondary | ICD-10-CM | POA: Diagnosis not present

## 2020-09-25 LAB — BASIC METABOLIC PANEL
Anion gap: 9 (ref 5–15)
BUN: 15 mg/dL (ref 6–20)
CO2: 18 mmol/L — ABNORMAL LOW (ref 22–32)
Calcium: 6.5 mg/dL — ABNORMAL LOW (ref 8.9–10.3)
Chloride: 118 mmol/L — ABNORMAL HIGH (ref 98–111)
Creatinine, Ser: 0.96 mg/dL (ref 0.44–1.00)
GFR calc Af Amer: >60 mL/min (ref >60)
GFR calc non Af Amer: >60 mL/min (ref >60)
Glucose, Bld: 112 mg/dL — ABNORMAL HIGH (ref 70–99)
Potassium: 4 mmol/L (ref 3.5–5.1)
Sodium: 145 mmol/L (ref 135–145)

## 2020-09-25 LAB — CBC
HCT: 27.7 % — ABNORMAL LOW (ref 36.0–46.0)
HCT: 26.3 % — ABNORMAL LOW (ref 36.0–46.0)
Hemoglobin: 8.6 g/dL — ABNORMAL LOW (ref 12.0–15.0)
Hemoglobin: 8.2 g/dL — ABNORMAL LOW (ref 12.0–15.0)
MCH: 29.4 pg (ref 26.0–34.0)
MCH: 30.1 pg (ref 26.0–34.0)
MCHC: 31 g/dL (ref 30.0–36.0)
MCHC: 31.2 g/dL (ref 30.0–36.0)
MCV: 94.5 fL (ref 80.0–100.0)
MCV: 96.7 fL (ref 80.0–100.0)
Platelets: 67 10*3/uL — ABNORMAL LOW (ref 150–400)
Platelets: 66 10*3/uL — ABNORMAL LOW (ref 150–400)
RBC: 2.93 MIL/uL — ABNORMAL LOW (ref 3.87–5.11)
RBC: 2.72 MIL/uL — ABNORMAL LOW (ref 3.87–5.11)
RDW: 16.2 % — ABNORMAL HIGH (ref 11.5–15.5)
RDW: 16.4 % — ABNORMAL HIGH (ref 11.5–15.5)
WBC: 7.3 10*3/uL (ref 4.0–10.5)
WBC: 9.2 10*3/uL (ref 4.0–10.5)
nRBC: 0.5 % — ABNORMAL HIGH (ref 0.0–0.2)
nRBC: 0.3 % — ABNORMAL HIGH (ref 0.0–0.2)

## 2020-09-25 LAB — MAGNESIUM: Magnesium: 1.5 mg/dL — ABNORMAL LOW (ref 1.7–2.4)

## 2020-09-25 LAB — BLOOD GAS, ARTERIAL
Acid-base deficit: 7.3 mmol/L — ABNORMAL HIGH (ref 0.0–2.0)
Bicarbonate: 18.8 mmol/L — ABNORMAL LOW (ref 20.0–28.0)
FIO2: 70
O2 Saturation: 92 %
Patient temperature: 37
pCO2 arterial: 45.5 mmHg (ref 32.0–48.0)
pH, Arterial: 7.24 — ABNORMAL LOW (ref 7.350–7.450)
pO2, Arterial: 69.1 mmHg — ABNORMAL LOW (ref 83.0–108.0)

## 2020-09-25 LAB — PREPARE RBC (CROSSMATCH)

## 2020-09-25 LAB — LACTIC ACID, PLASMA: Lactic Acid, Venous: 1 mmol/L (ref 0.5–1.9)

## 2020-09-25 LAB — BLOOD PRODUCT ORDER (VERBAL) VERIFICATION

## 2020-09-25 MED ORDER — SODIUM CHLORIDE 0.9 % IV SOLN
INTRAVENOUS | Status: DC | PRN
  Administered 2020-09-25 – 2020-10-03 (×2): 500 mL via INTRAVENOUS
  Administered 2020-10-13: 250 mL via INTRAVENOUS
  Administered 2020-10-15: 500 mL via INTRAVENOUS
  Administered 2020-11-04: 1000 mL via INTRAVENOUS
  Administered 2020-11-10: 500 mL via INTRAVENOUS
  Administered 2020-11-21: 1000 mL via INTRAVENOUS
  Administered 2020-11-25: 500 mL via INTRAVENOUS
  Administered 2020-11-28: 250 mL via INTRAVENOUS
  Administered 2020-12-05 – 2020-12-07 (×2): 1000 mL via INTRAVENOUS

## 2020-09-25 MED ORDER — SODIUM CHLORIDE 0.9% IV SOLUTION: Freq: Once | INTRAVENOUS | Status: DC

## 2020-09-25 MED ORDER — ALBUMIN HUMAN 5 % IV SOLN
25.0000 g | Freq: Once | INTRAVENOUS | Status: AC
  Administered 2020-09-25: 25 g via INTRAVENOUS
  Filled 2020-09-25: qty 500

## 2020-09-25 MED ORDER — IPRATROPIUM-ALBUTEROL 0.5-2.5 (3) MG/3ML IN SOLN
3.0000 mL | Freq: Four times a day (QID) | RESPIRATORY_TRACT | Status: DC | PRN
  Administered 2020-09-25 – 2020-09-29 (×2): 3 mL via RESPIRATORY_TRACT
  Filled 2020-09-25 (×2): qty 3

## 2020-09-25 MED ORDER — ACETAMINOPHEN 325 MG PO TABS
650.0000 mg | ORAL_TABLET | Freq: Four times a day (QID) | ORAL | Status: DC | PRN
  Administered 2020-09-25 – 2020-10-24 (×32): 650 mg via NASOGASTRIC
  Filled 2020-09-25 (×33): qty 2

## 2020-09-25 MED ORDER — CALCIUM GLUCONATE-NACL 1-0.675 GM/50ML-% IV SOLN
1.0000 g | Freq: Once | INTRAVENOUS | Status: AC
  Administered 2020-09-25: 1000 mg via INTRAVENOUS
  Filled 2020-09-25: qty 50

## 2020-09-25 MED ORDER — CHLORHEXIDINE GLUCONATE CLOTH 2 % EX PADS
6.0000 | MEDICATED_PAD | Freq: Every day | CUTANEOUS | Status: DC
  Administered 2020-09-26 – 2020-11-27 (×44): 6 via TOPICAL

## 2020-09-25 MED ORDER — ALBUMIN HUMAN 5 % IV SOLN
12.5000 g | Freq: Once | INTRAVENOUS | Status: AC
  Administered 2020-09-25: 12.5 g via INTRAVENOUS
  Filled 2020-09-25: qty 250

## 2020-09-25 NOTE — Plan of Care (Signed)
  Problem: Pain Managment: Goal: General experience of comfort will improve Outcome: Progressing   
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
## 2020-09-25 NOTE — Progress Notes (Signed)
Patient ID: Sara [name], female   DOB: [birth year], 54 y.o.   MRN: [id number] I called her daughter and updated her. I answered her questions. She reported the Orthopedic team has contacted her regarding surgery tomorrow.  Patient examined and I agree with the assessment and plan  Violeta Gelinas, MD, MPH, FACS Please use [email or web address] to contact on call provider 09/25/2020 1:26 PM
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
## 2020-09-25 NOTE — Plan of Care (Signed)
Pt had occasional episodes of oxygen desaturation to SpO2 70-80%. Increased FiO2 to 70%, PEEP 10.
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
## 2020-09-25 NOTE — TOC CAGE-AID Note (Signed)
Transition of Care [facility]) - CAGE-AID Screening   Patient Details  Name: Sara [name] MRN: [id number] Date of Birth: 1966-09-02  Transition of Care [facility]) CM/SW Contact:    Jimmy Picket, LCSWA Phone Number: 09/25/2020, 3:37 PM   Clinical Narrative:  Pt unable to participate in assessments due to being on ventilator.   CAGE-AID Screening: Substance Abuse Screening unable to be completed due to: : Patient unable to participate            Sara [name] Clinical Social Worker [phone]

## 2020-09-26 ENCOUNTER — Encounter (HOSPITAL_COMMUNITY): Payer: Self-pay | Admitting: Surgery

## 2020-09-26 ENCOUNTER — Inpatient Hospital Stay (HOSPITAL_COMMUNITY): Payer: Medicaid Other | Admitting: Anesthesiology

## 2020-09-26 ENCOUNTER — Inpatient Hospital Stay (HOSPITAL_COMMUNITY): Payer: Medicaid Other

## 2020-09-26 ENCOUNTER — Encounter (HOSPITAL_COMMUNITY): Admission: EM | Disposition: A | Discharge status: Rehabilitation Facility | Payer: Self-pay | Source: Home / Self Care

## 2020-09-26 DIAGNOSIS — S2243XA Multiple fractures of ribs, bilateral, initial encounter for closed fracture: Secondary | ICD-10-CM | POA: Diagnosis not present

## 2020-09-26 DIAGNOSIS — S82142A Displaced bicondylar fracture of left tibia, initial encounter for closed fracture: Secondary | ICD-10-CM | POA: Diagnosis not present

## 2020-09-26 DIAGNOSIS — S72462C Displaced supracondylar fracture with intracondylar extension of lower end of left femur, initial encounter for open fracture type IIIA, IIIB, or IIIC: Secondary | ICD-10-CM | POA: Diagnosis not present

## 2020-09-26 DIAGNOSIS — Z23 Encounter for immunization: Secondary | ICD-10-CM | POA: Diagnosis not present

## 2020-09-26 DIAGNOSIS — K659 Peritonitis, unspecified: Secondary | ICD-10-CM | POA: Diagnosis not present

## 2020-09-26 DIAGNOSIS — J80 Acute respiratory distress syndrome: Secondary | ICD-10-CM | POA: Diagnosis not present

## 2020-09-26 DIAGNOSIS — S72461A Displaced supracondylar fracture with intracondylar extension of lower end of right femur, initial encounter for closed fracture: Secondary | ICD-10-CM | POA: Diagnosis not present

## 2020-09-26 DIAGNOSIS — S72401A Unspecified fracture of lower end of right femur, initial encounter for closed fracture: Secondary | ICD-10-CM | POA: Diagnosis not present

## 2020-09-26 DIAGNOSIS — J869 Pyothorax without fistula: Secondary | ICD-10-CM | POA: Diagnosis not present

## 2020-09-26 DIAGNOSIS — D689 Coagulation defect, unspecified: Secondary | ICD-10-CM | POA: Diagnosis not present

## 2020-09-26 DIAGNOSIS — S270XXA Traumatic pneumothorax, initial encounter: Secondary | ICD-10-CM | POA: Diagnosis not present

## 2020-09-26 DIAGNOSIS — J9600 Acute respiratory failure, unspecified whether with hypoxia or hypercapnia: Secondary | ICD-10-CM | POA: Diagnosis not present

## 2020-09-26 DIAGNOSIS — S92061A Displaced intraarticular fracture of right calcaneus, initial encounter for closed fracture: Secondary | ICD-10-CM | POA: Diagnosis not present

## 2020-09-26 DIAGNOSIS — S72452C Displaced supracondylar fracture without intracondylar extension of lower end of left femur, initial encounter for open fracture type IIIA, IIIB, or IIIC: Secondary | ICD-10-CM | POA: Diagnosis not present

## 2020-09-26 DIAGNOSIS — Z20822 Contact with and (suspected) exposure to covid-19: Secondary | ICD-10-CM | POA: Diagnosis not present

## 2020-09-26 DIAGNOSIS — L97124 Non-pressure chronic ulcer of left thigh with necrosis of bone: Secondary | ICD-10-CM | POA: Diagnosis not present

## 2020-09-26 DIAGNOSIS — N179 Acute kidney failure, unspecified: Secondary | ICD-10-CM | POA: Diagnosis not present

## 2020-09-26 DIAGNOSIS — S52501A Unspecified fracture of the lower end of right radius, initial encounter for closed fracture: Secondary | ICD-10-CM | POA: Diagnosis not present

## 2020-09-26 DIAGNOSIS — D62 Acute posthemorrhagic anemia: Secondary | ICD-10-CM | POA: Diagnosis not present

## 2020-09-26 DIAGNOSIS — S36892A Contusion of other intra-abdominal organs, initial encounter: Secondary | ICD-10-CM | POA: Diagnosis not present

## 2020-09-26 DIAGNOSIS — S52561A Barton's fracture of right radius, initial encounter for closed fracture: Secondary | ICD-10-CM | POA: Diagnosis not present

## 2020-09-26 DIAGNOSIS — L97824 Non-pressure chronic ulcer of other part of left lower leg with necrosis of bone: Secondary | ICD-10-CM | POA: Diagnosis not present

## 2020-09-26 HISTORY — PX: OPEN REDUCTION INTERNAL FIXATION (ORIF) DISTAL RADIAL FRACTURE: SHX5989

## 2020-09-26 HISTORY — PX: ORIF TIBIA PLATEAU: SHX2132

## 2020-09-26 LAB — TYPE AND SCREEN
ABO/RH(D): O POS
Antibody Screen: NEGATIVE
Unit division: 0
Unit division: 0
Unit division: 0
Unit division: 0
Unit division: 0
Unit division: 0
Unit division: 0
Unit division: 0
Unit division: 0
Unit division: 0

## 2020-09-26 LAB — POCT I-STAT 7, (LYTES, BLD GAS, ICA,H+H)
Acid-base deficit: 9 mmol/L — ABNORMAL HIGH (ref 0.0–2.0)
Acid-base deficit: 9 mmol/L — ABNORMAL HIGH (ref 0.0–2.0)
Acid-base deficit: 9 mmol/L — ABNORMAL HIGH (ref 0.0–2.0)
Acid-base deficit: 9 mmol/L — ABNORMAL HIGH (ref 0.0–2.0)
Bicarbonate: 19 mmol/L — ABNORMAL LOW (ref 20.0–28.0)
Bicarbonate: 18.4 mmol/L — ABNORMAL LOW (ref 20.0–28.0)
Bicarbonate: 18.2 mmol/L — ABNORMAL LOW (ref 20.0–28.0)
Bicarbonate: 17.6 mmol/L — ABNORMAL LOW (ref 20.0–28.0)
Calcium, Ion: 1.12 mmol/L — ABNORMAL LOW (ref 1.15–1.40)
Calcium, Ion: 1.06 mmol/L — ABNORMAL LOW (ref 1.15–1.40)
Calcium, Ion: 1.05 mmol/L — ABNORMAL LOW (ref 1.15–1.40)
Calcium, Ion: 1.03 mmol/L — ABNORMAL LOW (ref 1.15–1.40)
HCT: 26 % — ABNORMAL LOW (ref 36.0–46.0)
HCT: 25 % — ABNORMAL LOW (ref 36.0–46.0)
HCT: 23 % — ABNORMAL LOW (ref 36.0–46.0)
HCT: 24 % — ABNORMAL LOW (ref 36.0–46.0)
Hemoglobin: 8.8 g/dL — ABNORMAL LOW (ref 12.0–15.0)
Hemoglobin: 8.5 g/dL — ABNORMAL LOW (ref 12.0–15.0)
Hemoglobin: 7.8 g/dL — ABNORMAL LOW (ref 12.0–15.0)
Hemoglobin: 8.2 g/dL — ABNORMAL LOW (ref 12.0–15.0)
O2 Saturation: 99 %
O2 Saturation: 93 %
O2 Saturation: 95 %
O2 Saturation: 95 %
Patient temperature: 101.4
Patient temperature: 36.3
Patient temperature: 36
Patient temperature: 36
Potassium: 4.3 mmol/L (ref 3.5–5.1)
Potassium: 3.9 mmol/L (ref 3.5–5.1)
Potassium: 3.8 mmol/L (ref 3.5–5.1)
Potassium: 3.8 mmol/L (ref 3.5–5.1)
Sodium: 147 mmol/L — ABNORMAL HIGH (ref 135–145)
Sodium: 151 mmol/L — ABNORMAL HIGH (ref 135–145)
Sodium: 148 mmol/L — ABNORMAL HIGH (ref 135–145)
Sodium: 148 mmol/L — ABNORMAL HIGH (ref 135–145)
TCO2: 21 mmol/L — ABNORMAL LOW (ref 22–32)
TCO2: 20 mmol/L — ABNORMAL LOW (ref 22–32)
TCO2: 20 mmol/L — ABNORMAL LOW (ref 22–32)
TCO2: 19 mmol/L — ABNORMAL LOW (ref 22–32)
pCO2 arterial: 55.9 mmHg — ABNORMAL HIGH (ref 32.0–48.0)
pCO2 arterial: 47.1 mmHg (ref 32.0–48.0)
pCO2 arterial: 41.2 mmHg (ref 32.0–48.0)
pCO2 arterial: 38.7 mmHg (ref 32.0–48.0)
pH, Arterial: 7.149 — CL (ref 7.350–7.450)
pH, Arterial: 7.196 — CL (ref 7.350–7.450)
pH, Arterial: 7.248 — ABNORMAL LOW (ref 7.350–7.450)
pH, Arterial: 7.259 — ABNORMAL LOW (ref 7.350–7.450)
pO2, Arterial: 184 mmHg — ABNORMAL HIGH (ref 83.0–108.0)
pO2, Arterial: 80 mmHg — ABNORMAL LOW (ref 83.0–108.0)
pO2, Arterial: 83 mmHg (ref 83.0–108.0)
pO2, Arterial: 85 mmHg (ref 83.0–108.0)

## 2020-09-26 LAB — BPAM RBC
Blood Product Expiration Date: 202110202359
Blood Product Expiration Date: 202110222359
Blood Product Expiration Date: 202110252359
Blood Product Expiration Date: 202110272359
Blood Product Expiration Date: 202110272359
Blood Product Expiration Date: 202110272359
Blood Product Expiration Date: 202110272359
Blood Product Expiration Date: 202110272359
Blood Product Expiration Date: 202110282359
Blood Product Expiration Date: 202110282359
ISSUE DATE / TIME: 202109242125
ISSUE DATE / TIME: 202109242138
ISSUE DATE / TIME: 202109242241
ISSUE DATE / TIME: 202109242241
ISSUE DATE / TIME: 202109260811
ISSUE DATE / TIME: 202109260811
ISSUE DATE / TIME: 202109261835
ISSUE DATE / TIME: 202109262148
ISSUE DATE / TIME: 202109271636
ISSUE DATE / TIME: 202109271636
Unit Type and Rh: 5100
Unit Type and Rh: 5100
Unit Type and Rh: 5100
Unit Type and Rh: 5100
Unit Type and Rh: 5100
Unit Type and Rh: 5100
Unit Type and Rh: 5100
Unit Type and Rh: 5100
Unit Type and Rh: 5100
Unit Type and Rh: 5100

## 2020-09-26 LAB — URINE CULTURE: Culture: NO GROWTH

## 2020-09-26 LAB — BASIC METABOLIC PANEL
Anion gap: 9 (ref 5–15)
BUN: 18 mg/dL (ref 6–20)
CO2: 18 mmol/L — ABNORMAL LOW (ref 22–32)
Calcium: 7.1 mg/dL — ABNORMAL LOW (ref 8.9–10.3)
Chloride: 119 mmol/L — ABNORMAL HIGH (ref 98–111)
Creatinine, Ser: 1.49 mg/dL — ABNORMAL HIGH (ref 0.44–1.00)
GFR calc Af Amer: 46 mL/min — ABNORMAL LOW (ref >60)
GFR calc non Af Amer: 40 mL/min — ABNORMAL LOW (ref >60)
Glucose, Bld: 91 mg/dL (ref 70–99)
Potassium: 4.4 mmol/L (ref 3.5–5.1)
Sodium: 146 mmol/L — ABNORMAL HIGH (ref 135–145)

## 2020-09-26 LAB — BLOOD GAS, ARTERIAL
Acid-base deficit: 11 mmol/L — ABNORMAL HIGH (ref 0.0–2.0)
Bicarbonate: 15.4 mmol/L — ABNORMAL LOW (ref 20.0–28.0)
FIO2: 50
O2 Saturation: 98.4 %
Patient temperature: 37
pCO2 arterial: 40.7 mmHg (ref 32.0–48.0)
pH, Arterial: 7.204 — ABNORMAL LOW (ref 7.350–7.450)
pO2, Arterial: 126 mmHg — ABNORMAL HIGH (ref 83.0–108.0)

## 2020-09-26 LAB — PREPARE RBC (CROSSMATCH)

## 2020-09-26 LAB — CBC
HCT: 30.3 % — ABNORMAL LOW (ref 36.0–46.0)
Hemoglobin: 9.6 g/dL — ABNORMAL LOW (ref 12.0–15.0)
MCH: 30.8 pg (ref 26.0–34.0)
MCHC: 31.7 g/dL (ref 30.0–36.0)
MCV: 97.1 fL (ref 80.0–100.0)
Platelets: 77 10*3/uL — ABNORMAL LOW (ref 150–400)
RBC: 3.12 MIL/uL — ABNORMAL LOW (ref 3.87–5.11)
RDW: 16.2 % — ABNORMAL HIGH (ref 11.5–15.5)
WBC: 12.8 10*3/uL — ABNORMAL HIGH (ref 4.0–10.5)
nRBC: 0.3 % — ABNORMAL HIGH (ref 0.0–0.2)

## 2020-09-26 LAB — SURGICAL PATHOLOGY

## 2020-09-26 SURGERY — OPEN REDUCTION INTERNAL FIXATION (ORIF) TIBIAL PLATEAU
Anesthesia: General | Site: Leg Upper | Laterality: Right

## 2020-09-26 MED ORDER — ONDANSETRON HCL 4 MG/2ML IJ SOLN
INTRAMUSCULAR | Status: AC
  Filled 2020-09-26: qty 2

## 2020-09-26 MED ORDER — PHENYLEPHRINE HCL-NACL 10-0.9 MG/250ML-% IV SOLN
INTRAVENOUS | Status: DC | PRN
  Administered 2020-09-26: 40 ug/min via INTRAVENOUS

## 2020-09-26 MED ORDER — SODIUM CHLORIDE 0.9 % IV BOLUS
1000.0000 mL | Freq: Once | INTRAVENOUS | Status: AC
  Administered 2020-09-26: 1000 mL via INTRAVENOUS

## 2020-09-26 MED ORDER — FENTANYL CITRATE (PF) 250 MCG/5ML IJ SOLN
INTRAMUSCULAR | Status: AC
  Filled 2020-09-26: qty 5

## 2020-09-26 MED ORDER — TOBRAMYCIN SULFATE 1.2 G IJ SOLR
INTRAMUSCULAR | Status: AC
  Filled 2020-09-26: qty 1.2

## 2020-09-26 MED ORDER — SODIUM CHLORIDE 0.9% IV SOLUTION: Freq: Once | INTRAVENOUS | Status: DC

## 2020-09-26 MED ORDER — ROCURONIUM BROMIDE 10 MG/ML (PF) SYRINGE
PREFILLED_SYRINGE | INTRAVENOUS | Status: AC
  Filled 2020-09-26: qty 10

## 2020-09-26 MED ORDER — VANCOMYCIN HCL 1000 MG IV SOLR
INTRAVENOUS | Status: AC
  Filled 2020-09-26: qty 1000

## 2020-09-26 MED ORDER — CEFAZOLIN SODIUM-DEXTROSE 2-4 GM/100ML-% IV SOLN
2.0000 g | INTRAVENOUS | Status: AC
  Administered 2020-09-26: 2 g via INTRAVENOUS

## 2020-09-26 MED ORDER — CEFAZOLIN SODIUM 1 G IJ SOLR
INTRAMUSCULAR | Status: AC
  Filled 2020-09-26: qty 20

## 2020-09-26 MED ORDER — 0.9 % SODIUM CHLORIDE (POUR BTL) OPTIME
TOPICAL | Status: DC | PRN
  Administered 2020-09-26 (×2): 1000 mL

## 2020-09-26 MED ORDER — PROPOFOL 10 MG/ML IV BOLUS
INTRAVENOUS | Status: DC | PRN
  Administered 2020-09-26: 100 mg via INTRAVENOUS

## 2020-09-26 MED ORDER — LACTATED RINGERS IV SOLN: INTRAVENOUS | Status: DC | PRN

## 2020-09-26 MED ORDER — LIDOCAINE 2% (20 MG/ML) 5 ML SYRINGE
INTRAMUSCULAR | Status: AC
  Filled 2020-09-26: qty 5

## 2020-09-26 MED ORDER — MIDAZOLAM HCL 2 MG/2ML IJ SOLN
INTRAMUSCULAR | Status: AC
  Filled 2020-09-26: qty 2

## 2020-09-26 MED ORDER — SODIUM CHLORIDE 0.9 % IR SOLN
Status: DC | PRN
  Administered 2020-09-26: 3000 mL

## 2020-09-26 MED ORDER — VANCOMYCIN HCL 1000 MG IV SOLR
INTRAVENOUS | Status: DC | PRN
  Administered 2020-09-26: 1000 mg
  Administered 2020-09-26: 1000 mg via TOPICAL

## 2020-09-26 MED ORDER — TOBRAMYCIN SULFATE 1.2 G IJ SOLR
INTRAMUSCULAR | Status: DC | PRN
  Administered 2020-09-26: 1.2 g via TOPICAL

## 2020-09-26 MED ORDER — ROCURONIUM BROMIDE 100 MG/10ML IV SOLN
INTRAVENOUS | Status: DC | PRN
  Administered 2020-09-26 (×2): 100 mg via INTRAVENOUS

## 2020-09-26 MED ORDER — SODIUM BICARBONATE 8.4 % IV SOLN
100.0000 meq | Freq: Once | INTRAVENOUS | Status: AC
  Administered 2020-09-26: 100 meq via INTRAVENOUS
  Filled 2020-09-26: qty 50

## 2020-09-26 MED ORDER — SODIUM CHLORIDE 0.9 % IV SOLN
2.0000 g | INTRAVENOUS | Status: AC
  Administered 2020-09-26 – 2020-09-28 (×3): 2 g via INTRAVENOUS
  Filled 2020-09-26: qty 2
  Filled 2020-09-26: qty 20
  Filled 2020-09-26: qty 2

## 2020-09-26 MED ORDER — MIDAZOLAM HCL 5 MG/5ML IJ SOLN
INTRAMUSCULAR | Status: DC | PRN
  Administered 2020-09-26: 2 mg via INTRAVENOUS

## 2020-09-26 MED ORDER — PHENYLEPHRINE HCL (PRESSORS) 10 MG/ML IV SOLN
INTRAVENOUS | Status: DC | PRN
  Administered 2020-09-26: 120 ug via INTRAVENOUS

## 2020-09-26 MED ORDER — FENTANYL CITRATE (PF) 100 MCG/2ML IJ SOLN
INTRAMUSCULAR | Status: DC | PRN
Start: 2020-09-26 — End: 2020-09-26
  Administered 2020-09-26 (×5): 50 ug via INTRAVENOUS

## 2020-09-26 MED ORDER — ALBUMIN HUMAN 5 % IV SOLN: INTRAVENOUS | Status: DC | PRN

## 2020-09-26 MED ORDER — PROPOFOL 10 MG/ML IV BOLUS
INTRAVENOUS | Status: AC
  Filled 2020-09-26: qty 20

## 2020-09-26 MED ORDER — SODIUM CHLORIDE 0.45 % IV SOLN: INTRAVENOUS | Status: DC

## 2020-09-26 SURGICAL SUPPLY — 151 items
BANDAGE ESMARK 6X9 LF (GAUZE/BANDAGES/DRESSINGS) IMPLANT
BIT DRILL 2.2 SS TIBIAL (BIT) ×3 IMPLANT
BIT DRILL 3.3 LONG (BIT) ×3 IMPLANT
BIT DRILL 3.5X5.5 QC CALB (BIT) ×3 IMPLANT
BIT DRILL 4.3 (BIT) ×3
BIT DRILL 4.3X300MM (BIT) ×2 IMPLANT
BIT DRILL CALIBRATED 2.7 (BIT) ×3 IMPLANT
BIT DRILL LONG 3.3 (BIT) ×3 IMPLANT
BIT DRILL QC 3.3X195 (BIT) ×3 IMPLANT
BLADE CLIPPER SURG (BLADE) IMPLANT
BLADE SURG 10 STRL SS (BLADE) ×6 IMPLANT
BLADE SURG 15 STRL LF DISP TIS (BLADE) ×4 IMPLANT
BLADE SURG 15 STRL SS (BLADE) ×6
BNDG CMPR 9X6 STRL LF SNTH (GAUZE/BANDAGES/DRESSINGS)
BNDG CMPR MED 10X6 ELC LF (GAUZE/BANDAGES/DRESSINGS) ×2
BNDG CMPR MED 15X6 ELC VLCR LF (GAUZE/BANDAGES/DRESSINGS) ×2
BNDG CMPR STD VLCR NS LF 5.8X4 (GAUZE/BANDAGES/DRESSINGS) ×4
BNDG COHESIVE 4X5 TAN STRL (GAUZE/BANDAGES/DRESSINGS) IMPLANT
BNDG ELASTIC 3X5.8 VLCR STR LF (GAUZE/BANDAGES/DRESSINGS) ×3 IMPLANT
BNDG ELASTIC 4X5.8 VLCR NS LF (GAUZE/BANDAGES/DRESSINGS) ×6 IMPLANT
BNDG ELASTIC 4X5.8 VLCR STR LF (GAUZE/BANDAGES/DRESSINGS) ×9 IMPLANT
BNDG ELASTIC 6X10 VLCR STRL LF (GAUZE/BANDAGES/DRESSINGS) ×3 IMPLANT
BNDG ELASTIC 6X15 VLCR STRL LF (GAUZE/BANDAGES/DRESSINGS) ×3 IMPLANT
BNDG ELASTIC 6X5.8 VLCR STR LF (GAUZE/BANDAGES/DRESSINGS) ×3 IMPLANT
BNDG ESMARK 6X9 LF (GAUZE/BANDAGES/DRESSINGS)
BNDG GAUZE ELAST 4 BULKY (GAUZE/BANDAGES/DRESSINGS) ×3 IMPLANT
BOWL SMART MIX CTS (DISPOSABLE) ×3 IMPLANT
BRUSH SCRUB EZ PLAIN DRY (MISCELLANEOUS) ×15 IMPLANT
CANISTER SUCT 3000ML PPV (MISCELLANEOUS) ×3 IMPLANT
CAP LOCK NCB (Cap) ×51 IMPLANT
CEMENT BONE REFOBACIN R1X40 US (Cement) ×3 IMPLANT
COVER SURGICAL LIGHT HANDLE (MISCELLANEOUS) ×3 IMPLANT
COVER WAND RF STERILE (DRAPES) IMPLANT
CUFF TOURN SGL QUICK 34 (TOURNIQUET CUFF)
CUFF TRNQT CYL 34X4.125X (TOURNIQUET CUFF) IMPLANT
DRAPE C-ARM 42X72 X-RAY (DRAPES) ×6 IMPLANT
DRAPE C-ARMOR (DRAPES) ×6 IMPLANT
DRAPE HALF SHEET 40X57 (DRAPES) ×3 IMPLANT
DRAPE INCISE IOBAN 66X45 STRL (DRAPES) ×3 IMPLANT
DRAPE U-SHAPE 47X51 STRL (DRAPES) IMPLANT
DRIVER BIT SQUARE 1.7/2.2 (TRAUMA) ×3 IMPLANT
DRSG ADAPTIC 3X8 NADH LF (GAUZE/BANDAGES/DRESSINGS) ×6 IMPLANT
DRSG AQUACEL AG ADV 3.5X 4 (GAUZE/BANDAGES/DRESSINGS) ×3 IMPLANT
DRSG AQUACEL AG ADV 3.5X10 (GAUZE/BANDAGES/DRESSINGS) ×6 IMPLANT
DRSG AQUACEL AG ADV 3.5X14 (GAUZE/BANDAGES/DRESSINGS) ×6 IMPLANT
DRSG PAD ABDOMINAL 8X10 ST (GAUZE/BANDAGES/DRESSINGS) ×3 IMPLANT
ELECT REM PT RETURN 9FT ADLT (ELECTROSURGICAL) ×3
ELECTRODE REM PT RTRN 9FT ADLT (ELECTROSURGICAL) ×2 IMPLANT
GAUZE SPONGE 4X4 12PLY STRL (GAUZE/BANDAGES/DRESSINGS) ×9 IMPLANT
GLOVE BIO SURGEON STRL SZ7.5 (GLOVE) ×3 IMPLANT
GLOVE BIO SURGEON STRL SZ8 (GLOVE) ×3 IMPLANT
GLOVE BIOGEL PI IND STRL 7.5 (GLOVE) ×2 IMPLANT
GLOVE BIOGEL PI IND STRL 8 (GLOVE) ×2 IMPLANT
GLOVE BIOGEL PI INDICATOR 7.5 (GLOVE) ×1
GLOVE BIOGEL PI INDICATOR 8 (GLOVE) ×1
GOWN STRL REUS W/ TWL LRG LVL3 (GOWN DISPOSABLE) ×4 IMPLANT
GOWN STRL REUS W/ TWL XL LVL3 (GOWN DISPOSABLE) ×2 IMPLANT
GOWN STRL REUS W/TWL LRG LVL3 (GOWN DISPOSABLE) ×6
GOWN STRL REUS W/TWL XL LVL3 (GOWN DISPOSABLE) ×3
IMMOBILIZER KNEE 22 UNIV (SOFTGOODS) ×3 IMPLANT
K-WIRE 1.6 (WIRE) ×9
K-WIRE 2.0 (WIRE) ×27
K-WIRE ACE 1.6X6 (WIRE) ×15
K-WIRE FX5X1.6XNS BN SS (WIRE) ×6
K-WIRE FXSTD 280X2XNS SS (WIRE) ×18
KIT BASIN OR (CUSTOM PROCEDURE TRAY) ×3 IMPLANT
KIT STIMULAN  10CC (Orthopedic Implant) ×1 IMPLANT
KIT STIMULAN 10CC (Orthopedic Implant) ×2 IMPLANT
KIT TURNOVER KIT B (KITS) ×3 IMPLANT
KWIRE ACE 1.6X6 (WIRE) ×10 IMPLANT
KWIRE FX5X1.6XNS BN SS (WIRE) ×6 IMPLANT
KWIRE FXSTD 280X2XNS SS (WIRE) ×18 IMPLANT
MARKER SKIN DUAL TIP RULER LAB (MISCELLANEOUS) ×3 IMPLANT
NDL SUT 6 .5 CRC .975X.05 MAYO (NEEDLE) IMPLANT
NEEDLE MAYO TAPER (NEEDLE)
NS IRRIG 1000ML POUR BTL (IV SOLUTION) ×3 IMPLANT
PACK ORTHO EXTREMITY (CUSTOM PROCEDURE TRAY) ×3 IMPLANT
PAD ARMBOARD 7.5X6 YLW CONV (MISCELLANEOUS) ×6 IMPLANT
PAD CAST 3X4 CTTN HI CHSV (CAST SUPPLIES) ×2 IMPLANT
PAD CAST 4YDX4 CTTN HI CHSV (CAST SUPPLIES) ×4 IMPLANT
PADDING CAST COTTON 3X4 STRL (CAST SUPPLIES) ×3
PADDING CAST COTTON 4X4 STRL (CAST SUPPLIES) ×6
PADDING CAST COTTON 6X4 STRL (CAST SUPPLIES) ×6 IMPLANT
PEG LOCKING SMOOTH 2.2X18 (Peg) ×6 IMPLANT
PEG LOCKING SMOOTH 2.2X22 (Screw) ×6 IMPLANT
PEG LOCKING SMOOTH 2.2X24 (Peg) ×9 IMPLANT
PLATE CALC MIS EXTEND SM RT (Plate) ×3 IMPLANT
PLATE DIST FEM 12H (Plate) ×3 IMPLANT
PLATE DVR CROSSLOCK STD RT (Plate) ×3 IMPLANT
PLATE FEM DIST NCB PP 278MM (Plate) ×3 IMPLANT
PLATE NCB LAT PROX 3H TIB 7H (Plate) ×3 IMPLANT
SCREW 5.0 80MM (Screw) ×12 IMPLANT
SCREW CORTICAL LOW PROF 3.5X34 (Screw) ×3 IMPLANT
SCREW LOCK 14X2.7X 3 LD TPR (Screw) ×2 IMPLANT
SCREW LOCK 16X2.7X 3 LD TPR (Screw) ×6 IMPLANT
SCREW LOCK CORT STAR 3.5X26 (Screw) ×3 IMPLANT
SCREW LOCK CORT STAR 3.5X32 (Screw) ×3 IMPLANT
SCREW LOCK CORT STAR 3.5X36 (Screw) ×3 IMPLANT
SCREW LOCKING 2.7X13MM (Screw) ×6 IMPLANT
SCREW LOCKING 2.7X14 (Screw) ×3 IMPLANT
SCREW LOCKING 2.7X16 (Screw) ×9 IMPLANT
SCREW LOCKING 2.7X9MM (Screw) ×3 IMPLANT
SCREW LOW PROF TIS 3.5X28MM (Screw) ×3 IMPLANT
SCREW LOW PROFILE 3.5X30MM TIS (Screw) ×6 IMPLANT
SCREW NCB 3.5X75X5X6.2XST (Screw) ×8 IMPLANT
SCREW NCB 4.0 28MM (Screw) ×6 IMPLANT
SCREW NCB 4.0 32MM (Screw) ×6 IMPLANT
SCREW NCB 4.0MX34M (Screw) ×3 IMPLANT
SCREW NCB 4.0MX44M (Screw) ×3 IMPLANT
SCREW NCB 4.0X26MM (Screw) ×3 IMPLANT
SCREW NCB 4.0X40MM (Screw) ×3 IMPLANT
SCREW NCB 4.0X75 CORT S/T (Screw) ×6 IMPLANT
SCREW NCB 4X3 4X70 (Screw) ×3 IMPLANT
SCREW NCB 5.0X34MM (Screw) ×15 IMPLANT
SCREW NCB 5.0X36MM (Screw) ×3 IMPLANT
SCREW NCB 5.0X38 (Screw) ×3 IMPLANT
SCREW NCB 5.0X75MM (Screw) ×12 IMPLANT
SCREW NCB 5.0X85MM (Screw) ×12 IMPLANT
SCREW NCB PA ST 4X3.4X85 (Screw) ×3 IMPLANT
SET CYSTO W/LG BORE CLAMP LF (SET/KITS/TRAYS/PACK) ×3 IMPLANT
SPLINT PLASTER CAST XFAST 5X30 (CAST SUPPLIES) ×2 IMPLANT
SPLINT PLASTER EXTRA FAST 3X15 (CAST SUPPLIES) ×1
SPLINT PLASTER GYPS XFAST 3X15 (CAST SUPPLIES) ×2 IMPLANT
SPLINT PLASTER XFAST SET 5X30 (CAST SUPPLIES) ×1
SPONGE LAP 18X18 RF (DISPOSABLE) ×9 IMPLANT
STAPLER VISISTAT 35W (STAPLE) ×3 IMPLANT
STOCKINETTE IMPERVIOUS LG (DRAPES) IMPLANT
SUCTION FRAZIER HANDLE 10FR (MISCELLANEOUS) ×1
SUCTION TUBE FRAZIER 10FR DISP (MISCELLANEOUS) ×2 IMPLANT
SUT ETHILON 2 0 FS 18 (SUTURE) ×24 IMPLANT
SUT ETHILON 3 0 PS 1 (SUTURE) ×12 IMPLANT
SUT PDS AB 0 CT 36 (SUTURE) ×6 IMPLANT
SUT PDS AB 1 CT  36 (SUTURE) ×2
SUT PDS AB 1 CT 36 (SUTURE) ×4 IMPLANT
SUT PDS AB 2-0 CT2 27 (SUTURE) ×6 IMPLANT
SUT PROLENE 0 CT 2 (SUTURE) ×6 IMPLANT
SUT VIC AB 0 CT1 27 (SUTURE) ×12
SUT VIC AB 0 CT1 27XBRD ANBCTR (SUTURE) ×8 IMPLANT
SUT VIC AB 1 CT1 18XCR BRD 8 (SUTURE) ×2 IMPLANT
SUT VIC AB 1 CT1 27 (SUTURE) ×3
SUT VIC AB 1 CT1 27XBRD ANBCTR (SUTURE) ×2 IMPLANT
SUT VIC AB 1 CT1 8-18 (SUTURE) ×3
SUT VIC AB 2-0 CT1 27 (SUTURE) ×12
SUT VIC AB 2-0 CT1 TAPERPNT 27 (SUTURE) ×8 IMPLANT
SYR BULB IRRIG 60ML STRL (SYRINGE) ×3 IMPLANT
TOWEL GREEN STERILE (TOWEL DISPOSABLE) ×6 IMPLANT
TOWEL GREEN STERILE FF (TOWEL DISPOSABLE) ×6 IMPLANT
TRAY FOLEY MTR SLVR 16FR STAT (SET/KITS/TRAYS/PACK) IMPLANT
TUBE CONNECTING 12X1/4 (SUCTIONS) ×3 IMPLANT
WATER STERILE IRR 1000ML POUR (IV SOLUTION) IMPLANT
YANKAUER SUCT BULB TIP NO VENT (SUCTIONS) ×3 IMPLANT

## 2020-09-26 NOTE — Brief Op Note (Signed)
09/26/2020  7:50 PM  PATIENT:  Sara [name]  54 y.o. female  PRE-OPERATIVE DIAGNOSIS:  1. RIGHT COMMINUTED SUPRACONDYLAR FEMUR FRACTURE WITH INTERCONDYLAR EXTENSION 2. LEFT COMMINUTED GRADE 3A OPEN SUPRACONDYLAR FEMUR FRACTURE WITH INTERCONDYLAR EXTENSION 3. LEFT BICONDYLAR TIBIAL PLATEAU FRACTURE 4. RIGHT INTRA-ARTICULAR CALCANEUS FRACTURE 5. RIGHT DISTAL RADIUS FRACTURE 6. RETAINED EXTERNAL FIXATOR WITH ULCERATED PIN SITES  POST-OPERATIVE DIAGNOSIS:  1. RIGHT COMMINUTED SUPRACONDYLAR FEMUR FRACTURE WITH INTERCONDYLAR EXTENSION 2. LEFT COMMINUTED GRADE 3A OPEN SUPRACONDYLAR FEMUR FRACTURE WITH INTERCONDYLAR EXTENSION 3. LEFT BICONDYLAR TIBIAL PLATEAU FRACTURE 4. RIGHT INTRA-ARTICULAR CALCANEUS FRACTURE 5. RIGHT DISTAL RADIUS FRACTURE 6. RETAINED EXTERNAL FIXATOR WITH ULCERATED PIN SITES  PROCEDURE:  Procedure(s): 1. OPEN REDUCTION INTERNAL FIXATION (ORIF) RIGHT DISTAL FEMUR WITH INTERCONDYLAR EXTENSION 2. OPEN REDUCTION INTERNAL FIXATION (ORIF) LEFT DISTAL FEMUR WITH INTERCONDYLAR EXTENSION 3. OPEN REDUCTION INTERNAL FIXATION (ORIF) RIGHT CALCANEUS 4. OPEN REDUCTION INTERNAL FIXATION (ORIF) RIGHT DISTAL RADIUS FRACTURE 5. OPEN REDUCTION INTERNAL FIXATION (ORIF) LEFT BICONDYLAR TIBIAL PLATEAU FRACTURE 6. DEBRIDEMENT OF OPEN FRACTURE INCLUDING BONE LEFT FEMUR 7. REMOVAL OF EXTERNAL FIXATOR UNDER ANESTHESIA LEFT LEG 8. DEBRIDEMENT AND CURETTAGE OF ULCERATED PIN SITES LEFT THIGH AND LEG 9. PLACEMENT OF ANTIBIOTIC SPACER LEFT FEMUR 10. ANTERIOR COMPARTMENT FASCIOTOMY LEFT LEG  SURGEON:  Surgeon(s) and Role:    Myrene Galas, MD - Primary  PHYSICIAN ASSISTANT: Montez Morita, PA-C  ANESTHESIA:   general  EBL:  500 mL   BLOOD ADMINISTERED: 350 CC PRBC  DRAINS: none   LOCAL MEDICATIONS USED:  NONE  SPECIMEN:  TWO LEFT OPEN FEMUR WOUND  DISPOSITION OF SPECIMEN:   MICRO  COUNTS:  YES  TOURNIQUET:  * No tourniquets in log *  DICTATION: .Other Dictation: Dictation  Number [id number]  PLAN OF CARE: Admit to inpatient   PATIENT DISPOSITION:  ICU - hemodynamically stable.   Delay start of Pharmacological VTE agent (>24hrs) due to surgical blood loss or risk of bleeding: PER TRAUMA

## 2020-09-26 NOTE — Anesthesia Preprocedure Evaluation (Addendum)
Anesthesia Evaluation  Patient identified by MRN, date of birth, ID band Patient awake and Patient unresponsive    Reviewed: Allergy & Precautions, NPO status , Patient's Chart, lab work & pertinent test results  Airway Mallampati: Intubated  TM Distance: >3 FB Neck ROM: Full    Dental   Pulmonary neg pulmonary ROS,    breath sounds clear to auscultation       Cardiovascular  Rhythm:Regular Rate:Normal  ECHO 9/21 EF 60-65% NL valves   Neuro/Psych negative neurological ROS  negative psych ROS   GI/Hepatic negative GI ROS, Neg liver ROS,   Endo/Other  negative endocrine ROS  Renal/GU negative Renal ROS  negative genitourinary   Musculoskeletal negative musculoskeletal ROS (+)   Abdominal   Peds negative pediatric ROS (+)  Hematology negative hematology ROS (+)   Anesthesia Other Findings   Reproductive/Obstetrics negative OB ROS                            Anesthesia Physical  Anesthesia Plan  ASA: IV  Anesthesia Plan: General   Post-op Pain Management:    Induction: Intravenous  PONV Risk Score and Plan: 3 and Ondansetron  Airway Management Planned: Oral ETT and LMA  Additional Equipment:   Intra-op Plan:   Post-operative Plan: Post-operative intubation/ventilation  Informed Consent: I have reviewed the patients History and Physical, chart, labs and discussed the procedure including the risks, benefits and alternatives for the proposed anesthesia with the patient or authorized representative who has indicated his/her understanding and acceptance.       Plan Discussed with: CRNA, Anesthesiologist and Surgeon  Anesthesia Plan Comments:        Anesthesia Quick Evaluation
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
## 2020-09-26 NOTE — Anesthesia Postprocedure Evaluation (Signed)
Anesthesia Post Note  Patient: Claris Che A Pollan  Procedure(s) Performed: OPEN REDUCTION INTERNAL FIXATION (ORIF) RIGHT DISTAL FEMUR, RIGHT CALCANEUS, LEFT DISTAL FEMUR, LEFT TIBIAL PLATEAU FRACTURE.  IRRIGATION AND DEBRIDEMENT LEFT LEG; REMOVAL OF EXTERNAL FIXATOR LEFT LEG (Bilateral Leg Upper) OPEN REDUCTION INTERNAL FIXATION (ORIF) DISTAL RADIUS FRACTURE (Right Arm Lower)     Patient location during evaluation: SICU Anesthesia Type: General Level of consciousness: sedated Pain management: pain level controlled Vital Signs Assessment: post-procedure vital signs reviewed and stable Respiratory status: patient remains intubated per anesthesia plan Cardiovascular status: stable Postop Assessment: no apparent nausea or vomiting Anesthetic complications: no   No complications documented.  Last Vitals:  Vitals:   09/26/20 1952 09/26/20 2000  BP:    Pulse: 68   Resp: (!) 24   Temp:  (!) 36.3 C  SpO2: 100%     Last Pain:  Vitals:   09/26/20 2000  TempSrc: Axillary  PainSc:                  Resa Rinks

## 2020-09-26 NOTE — Transfer of Care (Signed)
Immediate Anesthesia Transfer of Care Note  Patient: Sara [name]  Procedure(s) Performed: OPEN REDUCTION INTERNAL FIXATION (ORIF) RIGHT DISTAL FEMUR, RIGHT CALCANEUS, LEFT DISTAL FEMUR, LEFT TIBIAL PLATEAU FRACTURE.  IRRIGATION AND DEBRIDEMENT LEFT LEG; REMOVAL OF EXTERNAL FIXATOR LEFT LEG (Bilateral Leg Upper) OPEN REDUCTION INTERNAL FIXATION (ORIF) DISTAL RADIUS FRACTURE (Right Arm Lower)  Patient Location: ICU  Anesthesia Type:General  Level of Consciousness: sedated and Patient remains intubated per anesthesia plan  Airway & Oxygen Therapy: Patient remains intubated per anesthesia plan and Patient placed on Ventilator (see vital sign flow sheet for setting)  Post-op Assessment: Report given to RN and Post -op Vital signs reviewed and stable  Post vital signs: Reviewed and stable  Last Vitals:  Vitals Value Taken Time  BP 87/55 09/26/20 2001  Temp    Pulse 66 09/26/20 2004  Resp 24 09/26/20 2004  SpO2 96 % 09/26/20 2004  Vitals shown include unvalidated device data.  Last Pain:  Vitals:   09/26/20 1200  TempSrc: Axillary  PainSc:          Complications: No complications documented.

## 2020-09-26 NOTE — Consult Note (Addendum)
WOC Nurse ostomy follow up Patient care given in room [facility] 4N21 Patient is intubated and sedated.  Stoma type/location: LUQ colostomy Stomal assessment/size: Dark pink moist, all sutures intact, 1" top to bottom, 1 1/2" side to side. Stoma is just above skin level. Peristomal assessment: Tissue intact, normal color and texture. Treatment options for stomal/peristomal skin: Barrier ring placed Output: small amount of flatus, with scant amount of serosanguinous fluid in existing pouch Ostomy pouching: 2pc. Flat pouch 2 3/4 inches. Additional supplies in room.  Education provided: None due to sedated status Enrolled patient in [place] Secure Start Discharge program: No 2 Pc Pouch lawson # [phone] Skin barrier #2 Barrier rings # [id number]  Sara Ketterman L. Katrinka Blazing, MSN, RN, CMSRN, Angus Seller, [facility] Wound Treatment Associate Pager [phone]

## 2020-09-27 ENCOUNTER — Inpatient Hospital Stay (HOSPITAL_COMMUNITY): Payer: Medicaid Other

## 2020-09-27 ENCOUNTER — Encounter (HOSPITAL_COMMUNITY): Payer: Self-pay | Admitting: Orthopedic Surgery

## 2020-09-27 DIAGNOSIS — J9600 Acute respiratory failure, unspecified whether with hypoxia or hypercapnia: Secondary | ICD-10-CM | POA: Diagnosis not present

## 2020-09-27 DIAGNOSIS — S2243XA Multiple fractures of ribs, bilateral, initial encounter for closed fracture: Secondary | ICD-10-CM | POA: Diagnosis not present

## 2020-09-27 DIAGNOSIS — S270XXA Traumatic pneumothorax, initial encounter: Secondary | ICD-10-CM | POA: Diagnosis not present

## 2020-09-27 DIAGNOSIS — N179 Acute kidney failure, unspecified: Secondary | ICD-10-CM | POA: Diagnosis not present

## 2020-09-27 DIAGNOSIS — D62 Acute posthemorrhagic anemia: Secondary | ICD-10-CM | POA: Diagnosis not present

## 2020-09-27 LAB — BASIC METABOLIC PANEL
Anion gap: 12 (ref 5–15)
BUN: 24 mg/dL — ABNORMAL HIGH (ref 6–20)
CO2: 17 mmol/L — ABNORMAL LOW (ref 22–32)
Calcium: 7.2 mg/dL — ABNORMAL LOW (ref 8.9–10.3)
Chloride: 117 mmol/L — ABNORMAL HIGH (ref 98–111)
Creatinine, Ser: 1.62 mg/dL — ABNORMAL HIGH (ref 0.44–1.00)
GFR calc Af Amer: 42 mL/min — ABNORMAL LOW (ref >60)
GFR calc non Af Amer: 36 mL/min — ABNORMAL LOW (ref >60)
Glucose, Bld: 114 mg/dL — ABNORMAL HIGH (ref 70–99)
Potassium: 3.4 mmol/L — ABNORMAL LOW (ref 3.5–5.1)
Sodium: 146 mmol/L — ABNORMAL HIGH (ref 135–145)

## 2020-09-27 LAB — CBC
HCT: 28.4 % — ABNORMAL LOW (ref 36.0–46.0)
Hemoglobin: 8.9 g/dL — ABNORMAL LOW (ref 12.0–15.0)
MCH: 29.8 pg (ref 26.0–34.0)
MCHC: 31.3 g/dL (ref 30.0–36.0)
MCV: 95 fL (ref 80.0–100.0)
Platelets: 73 10*3/uL — ABNORMAL LOW (ref 150–400)
RBC: 2.99 MIL/uL — ABNORMAL LOW (ref 3.87–5.11)
RDW: 16 % — ABNORMAL HIGH (ref 11.5–15.5)
WBC: 14.6 10*3/uL — ABNORMAL HIGH (ref 4.0–10.5)
nRBC: 0.3 % — ABNORMAL HIGH (ref 0.0–0.2)

## 2020-09-27 LAB — TYPE AND SCREEN
ABO/RH(D): O POS
Antibody Screen: NEGATIVE
Unit division: 0

## 2020-09-27 LAB — BLOOD GAS, ARTERIAL
Acid-base deficit: 8.3 mmol/L — ABNORMAL HIGH (ref 0.0–2.0)
Bicarbonate: 17.5 mmol/L — ABNORMAL LOW (ref 20.0–28.0)
Drawn by: 418751
FIO2: 40
O2 Saturation: 93.3 %
Patient temperature: 37
pCO2 arterial: 41.1 mmHg (ref 32.0–48.0)
pH, Arterial: 7.253 — ABNORMAL LOW (ref 7.350–7.450)
pO2, Arterial: 65.3 mmHg — ABNORMAL LOW (ref 83.0–108.0)

## 2020-09-27 LAB — CULTURE, RESPIRATORY W GRAM STAIN: Culture: NORMAL

## 2020-09-27 LAB — BPAM RBC
Blood Product Expiration Date: 202110252359
ISSUE DATE / TIME: 202109281830
Unit Type and Rh: 5100

## 2020-09-27 MED ORDER — SODIUM CHLORIDE 0.9 % IV BOLUS
1000.0000 mL | Freq: Once | INTRAVENOUS | Status: AC
  Administered 2020-09-27: 1000 mL via INTRAVENOUS

## 2020-09-27 MED ORDER — POTASSIUM CHLORIDE 10 MEQ/50ML IV SOLN
10.0000 meq | INTRAVENOUS | Status: AC
  Administered 2020-09-27 (×2): 10 meq via INTRAVENOUS
  Filled 2020-09-27 (×2): qty 50

## 2020-09-27 MED ORDER — SODIUM BICARBONATE 8.4 % IV SOLN
100.0000 meq | Freq: Once | INTRAVENOUS | Status: AC
  Administered 2020-09-27: 100 meq via INTRAVENOUS
  Filled 2020-09-27: qty 50

## 2020-09-27 MED ORDER — ALBUMIN HUMAN 5 % IV SOLN
25.0000 g | Freq: Once | INTRAVENOUS | Status: AC
  Administered 2020-09-27: 25 g via INTRAVENOUS
  Filled 2020-09-27: qty 500

## 2020-09-27 MED ORDER — SODIUM BICARBONATE 8.4 % IV SOLN
INTRAVENOUS | Status: AC
  Filled 2020-09-27: qty 50
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
## 2020-09-28 DIAGNOSIS — J9600 Acute respiratory failure, unspecified whether with hypoxia or hypercapnia: Secondary | ICD-10-CM | POA: Diagnosis not present

## 2020-09-28 DIAGNOSIS — D62 Acute posthemorrhagic anemia: Secondary | ICD-10-CM | POA: Diagnosis not present

## 2020-09-28 DIAGNOSIS — S2243XA Multiple fractures of ribs, bilateral, initial encounter for closed fracture: Secondary | ICD-10-CM | POA: Diagnosis not present

## 2020-09-28 DIAGNOSIS — S270XXA Traumatic pneumothorax, initial encounter: Secondary | ICD-10-CM | POA: Diagnosis not present

## 2020-09-28 DIAGNOSIS — N179 Acute kidney failure, unspecified: Secondary | ICD-10-CM | POA: Diagnosis not present

## 2020-09-28 LAB — CBC
HCT: 26.1 % — ABNORMAL LOW (ref 36.0–46.0)
Hemoglobin: 8.5 g/dL — ABNORMAL LOW (ref 12.0–15.0)
MCH: 30.5 pg (ref 26.0–34.0)
MCHC: 32.6 g/dL (ref 30.0–36.0)
MCV: 93.5 fL (ref 80.0–100.0)
Platelets: 119 10*3/uL — ABNORMAL LOW (ref 150–400)
RBC: 2.79 MIL/uL — ABNORMAL LOW (ref 3.87–5.11)
RDW: 15.9 % — ABNORMAL HIGH (ref 11.5–15.5)
WBC: 12.8 10*3/uL — ABNORMAL HIGH (ref 4.0–10.5)
nRBC: 1.2 % — ABNORMAL HIGH (ref 0.0–0.2)

## 2020-09-28 LAB — BASIC METABOLIC PANEL
Anion gap: 12 (ref 5–15)
BUN: 24 mg/dL — ABNORMAL HIGH (ref 6–20)
CO2: 20 mmol/L — ABNORMAL LOW (ref 22–32)
Calcium: 7.3 mg/dL — ABNORMAL LOW (ref 8.9–10.3)
Chloride: 116 mmol/L — ABNORMAL HIGH (ref 98–111)
Creatinine, Ser: 1.7 mg/dL — ABNORMAL HIGH (ref 0.44–1.00)
GFR calc Af Amer: 39 mL/min — ABNORMAL LOW (ref >60)
GFR calc non Af Amer: 34 mL/min — ABNORMAL LOW (ref >60)
Glucose, Bld: 98 mg/dL (ref 70–99)
Potassium: 3.1 mmol/L — ABNORMAL LOW (ref 3.5–5.1)
Sodium: 148 mmol/L — ABNORMAL HIGH (ref 135–145)

## 2020-09-28 MED ORDER — LACTATED RINGERS IV BOLUS
1000.0000 mL | Freq: Once | INTRAVENOUS | Status: AC
  Administered 2020-09-28: 1000 mL via INTRAVENOUS

## 2020-09-28 MED ORDER — SODIUM BICARBONATE 8.4 % IV SOLN
50.0000 meq | Freq: Once | INTRAVENOUS | Status: AC
  Administered 2020-09-28: 50 meq via INTRAVENOUS
  Filled 2020-09-28: qty 50

## 2020-09-28 MED ORDER — POTASSIUM CHLORIDE 10 MEQ/50ML IV SOLN
10.0000 meq | INTRAVENOUS | Status: AC
  Administered 2020-09-28 (×3): 10 meq via INTRAVENOUS
  Filled 2020-09-28 (×4): qty 50

## 2020-09-28 MED ORDER — ALBUMIN HUMAN 5 % IV SOLN
25.0000 g | Freq: Once | INTRAVENOUS | Status: AC
  Administered 2020-09-28: 25 g via INTRAVENOUS
  Filled 2020-09-28: qty 500

## 2020-09-28 MED ORDER — SODIUM CHLORIDE 0.9% FLUSH
10.0000 mL | Freq: Two times a day (BID) | INTRAVENOUS | Status: DC
  Administered 2020-09-28: 10 mL
  Administered 2020-09-28: 20 mL
  Administered 2020-09-29 – 2020-09-30 (×3): 10 mL
  Administered 2020-09-30: 20 mL
  Administered 2020-10-01: 10 mL
  Administered 2020-10-01: 20 mL
  Administered 2020-10-02 – 2020-10-03 (×3): 10 mL
  Administered 2020-10-03: 20 mL
  Administered 2020-10-04: 10 mL
  Administered 2020-10-04: 20 mL
  Administered 2020-10-05 (×2): 10 mL
  Administered 2020-10-06: 30 mL
  Administered 2020-10-06 – 2020-10-11 (×11): 10 mL
  Administered 2020-10-12: 20 mL
  Administered 2020-10-12 – 2020-10-16 (×9): 10 mL
  Administered 2020-10-17: 20 mL
  Administered 2020-10-17: 10 mL
  Administered 2020-10-18: 30 mL
  Administered 2020-10-18 – 2020-11-28 (×48): 10 mL
  Administered 2020-12-03: 20 mL
  Administered 2020-12-04 – 2020-12-05 (×4): 10 mL
  Administered 2020-12-05: 20 mL
  Administered 2020-12-06 – 2020-12-11 (×10): 10 mL

## 2020-09-28 MED ORDER — FREE WATER
200.0000 mL | Freq: Four times a day (QID) | Status: DC
  Administered 2020-09-28 – 2020-10-04 (×25): 200 mL

## 2020-09-28 MED ORDER — ENOXAPARIN SODIUM 30 MG/0.3ML ~~LOC~~ SOLN
30.0000 mg | Freq: Two times a day (BID) | SUBCUTANEOUS | Status: DC
  Administered 2020-09-28 – 2020-10-06 (×17): 30 mg via SUBCUTANEOUS
  Filled 2020-09-28 (×17): qty 0.3

## 2020-09-28 MED ORDER — GUAIFENESIN 100 MG/5ML PO SOLN
15.0000 mL | Freq: Four times a day (QID) | ORAL | Status: DC
  Administered 2020-09-28 – 2020-10-30 (×126): 300 mg
  Filled 2020-09-28: qty 15
  Filled 2020-09-28: qty 20
  Filled 2020-09-28 (×2): qty 15
  Filled 2020-09-28: qty 20
  Filled 2020-09-28 (×2): qty 15
  Filled 2020-09-28: qty 20
  Filled 2020-09-28 (×7): qty 15
  Filled 2020-09-28: qty 10
  Filled 2020-09-28: qty 15
  Filled 2020-09-28: qty 10
  Filled 2020-09-28: qty 15
  Filled 2020-09-28: qty 20
  Filled 2020-09-28: qty 10
  Filled 2020-09-28 (×10): qty 15
  Filled 2020-09-28 (×2): qty 20
  Filled 2020-09-28 (×4): qty 15
  Filled 2020-09-28: qty 20
  Filled 2020-09-28 (×8): qty 15
  Filled 2020-09-28: qty 10
  Filled 2020-09-28: qty 15
  Filled 2020-09-28: qty 20
  Filled 2020-09-28: qty 15
  Filled 2020-09-28: qty 20
  Filled 2020-09-28 (×4): qty 15
  Filled 2020-09-28: qty 20
  Filled 2020-09-28: qty 15
  Filled 2020-09-28: qty 100
  Filled 2020-09-28 (×2): qty 10
  Filled 2020-09-28: qty 20
  Filled 2020-09-28 (×7): qty 15
  Filled 2020-09-28: qty 45
  Filled 2020-09-28: qty 15
  Filled 2020-09-28: qty 10
  Filled 2020-09-28 (×16): qty 15
  Filled 2020-09-28: qty 20
  Filled 2020-09-28 (×5): qty 15
  Filled 2020-09-28: qty 20
  Filled 2020-09-28: qty 45
  Filled 2020-09-28 (×3): qty 15
  Filled 2020-09-28: qty 20
  Filled 2020-09-28 (×4): qty 15
  Filled 2020-09-28: qty 20
  Filled 2020-09-28: qty 45
  Filled 2020-09-28 (×2): qty 15
  Filled 2020-09-28: qty 10
  Filled 2020-09-28 (×4): qty 15
  Filled 2020-09-28: qty 10
  Filled 2020-09-28: qty 15
  Filled 2020-09-28: qty 20
  Filled 2020-09-28 (×5): qty 15

## 2020-09-28 MED ORDER — SODIUM CHLORIDE 0.9% FLUSH
10.0000 mL | INTRAVENOUS | Status: DC | PRN
  Administered 2020-10-09: 10 mL

## 2020-09-28 NOTE — Progress Notes (Signed)
Patient ID: Sara [name], female   DOB: 1966/08/15, 54 y.o.   MRN: [id number] I called her daughter and updated her. Violeta Gelinas, MD, MPH, FACS Please use [email or web address] to contact on call provider

## 2020-09-28 NOTE — Progress Notes (Signed)
Patient only responding with "yes" and does not answer orientation questions appropriately. Notified Dr. Yetta Barre, who will contact family and come to bedside to reasses.
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
## 2020-09-28 NOTE — Plan of Care (Signed)
  Problem: Pain Management: Goal: Pain level will decrease Outcome: Progressing   

## 2020-09-29 ENCOUNTER — Inpatient Hospital Stay (HOSPITAL_COMMUNITY): Payer: Medicaid Other

## 2020-09-29 DIAGNOSIS — J9600 Acute respiratory failure, unspecified whether with hypoxia or hypercapnia: Secondary | ICD-10-CM | POA: Diagnosis not present

## 2020-09-29 DIAGNOSIS — D62 Acute posthemorrhagic anemia: Secondary | ICD-10-CM | POA: Diagnosis not present

## 2020-09-29 DIAGNOSIS — N179 Acute kidney failure, unspecified: Secondary | ICD-10-CM | POA: Diagnosis not present

## 2020-09-29 DIAGNOSIS — Z419 Encounter for procedure for purposes other than remedying health state, unspecified: Secondary | ICD-10-CM | POA: Diagnosis not present

## 2020-09-29 LAB — BASIC METABOLIC PANEL
Anion gap: 13 (ref 5–15)
Anion gap: 13 (ref 5–15)
BUN: 22 mg/dL — ABNORMAL HIGH (ref 6–20)
BUN: 28 mg/dL — ABNORMAL HIGH (ref 6–20)
CO2: 20 mmol/L — ABNORMAL LOW (ref 22–32)
CO2: 19 mmol/L — ABNORMAL LOW (ref 22–32)
Calcium: 7.3 mg/dL — ABNORMAL LOW (ref 8.9–10.3)
Calcium: 7.4 mg/dL — ABNORMAL LOW (ref 8.9–10.3)
Chloride: 113 mmol/L — ABNORMAL HIGH (ref 98–111)
Chloride: 115 mmol/L — ABNORMAL HIGH (ref 98–111)
Creatinine, Ser: 1.63 mg/dL — ABNORMAL HIGH (ref 0.44–1.00)
Creatinine, Ser: 1.75 mg/dL — ABNORMAL HIGH (ref 0.44–1.00)
GFR calc Af Amer: 42 mL/min — ABNORMAL LOW (ref >60)
GFR calc Af Amer: 38 mL/min — ABNORMAL LOW (ref >60)
GFR calc non Af Amer: 36 mL/min — ABNORMAL LOW (ref >60)
GFR calc non Af Amer: 33 mL/min — ABNORMAL LOW (ref >60)
Glucose, Bld: 87 mg/dL (ref 70–99)
Glucose, Bld: 107 mg/dL — ABNORMAL HIGH (ref 70–99)
Potassium: 3 mmol/L — ABNORMAL LOW (ref 3.5–5.1)
Potassium: 3.8 mmol/L (ref 3.5–5.1)
Sodium: 146 mmol/L — ABNORMAL HIGH (ref 135–145)
Sodium: 147 mmol/L — ABNORMAL HIGH (ref 135–145)

## 2020-09-29 LAB — CBC
HCT: 24.2 % — ABNORMAL LOW (ref 36.0–46.0)
Hemoglobin: 7.8 g/dL — ABNORMAL LOW (ref 12.0–15.0)
MCH: 30.1 pg (ref 26.0–34.0)
MCHC: 32.2 g/dL (ref 30.0–36.0)
MCV: 93.4 fL (ref 80.0–100.0)
Platelets: 147 10*3/uL — ABNORMAL LOW (ref 150–400)
RBC: 2.59 MIL/uL — ABNORMAL LOW (ref 3.87–5.11)
RDW: 16.4 % — ABNORMAL HIGH (ref 11.5–15.5)
WBC: 14 10*3/uL — ABNORMAL HIGH (ref 4.0–10.5)
nRBC: 0.9 % — ABNORMAL HIGH (ref 0.0–0.2)

## 2020-09-29 LAB — HEPATIC FUNCTION PANEL
ALT: 30 U/L (ref 0–44)
AST: 65 U/L — ABNORMAL HIGH (ref 15–41)
Albumin: 1.6 g/dL — ABNORMAL LOW (ref 3.5–5.0)
Alkaline Phosphatase: 63 U/L (ref 38–126)
Bilirubin, Direct: 5.4 mg/dL — ABNORMAL HIGH (ref 0.0–0.2)
Indirect Bilirubin: 3.2 mg/dL — ABNORMAL HIGH (ref 0.3–0.9)
Total Bilirubin: 8.6 mg/dL — ABNORMAL HIGH (ref 0.3–1.2)
Total Protein: 3.9 g/dL — ABNORMAL LOW (ref 6.5–8.1)

## 2020-09-29 LAB — POCT I-STAT 7, (LYTES, BLD GAS, ICA,H+H)
Acid-base deficit: 5 mmol/L — ABNORMAL HIGH (ref 0.0–2.0)
Bicarbonate: 20.4 mmol/L (ref 20.0–28.0)
Calcium, Ion: 1.06 mmol/L — ABNORMAL LOW (ref 1.15–1.40)
HCT: 22 % — ABNORMAL LOW (ref 36.0–46.0)
Hemoglobin: 7.5 g/dL — ABNORMAL LOW (ref 12.0–15.0)
O2 Saturation: 94 %
Patient temperature: 99
Potassium: 3 mmol/L — ABNORMAL LOW (ref 3.5–5.1)
Sodium: 146 mmol/L — ABNORMAL HIGH (ref 135–145)
TCO2: 21 mmol/L — ABNORMAL LOW (ref 22–32)
pCO2 arterial: 36.5 mmHg (ref 32.0–48.0)
pH, Arterial: 7.356 (ref 7.350–7.450)
pO2, Arterial: 73 mmHg — ABNORMAL LOW (ref 83.0–108.0)

## 2020-09-29 LAB — GLUCOSE, CAPILLARY
Glucose-Capillary: 77 mg/dL (ref 70–99)
Glucose-Capillary: 107 mg/dL — ABNORMAL HIGH (ref 70–99)
Glucose-Capillary: 128 mg/dL — ABNORMAL HIGH (ref 70–99)
Glucose-Capillary: 118 mg/dL — ABNORMAL HIGH (ref 70–99)

## 2020-09-29 LAB — MAGNESIUM: Magnesium: 1.6 mg/dL — ABNORMAL LOW (ref 1.7–2.4)

## 2020-09-29 LAB — AMMONIA: Ammonia: 62 umol/L — ABNORMAL HIGH (ref 9–35)

## 2020-09-29 LAB — PHOSPHORUS: Phosphorus: 2.2 mg/dL — ABNORMAL LOW (ref 2.5–4.6)

## 2020-09-29 MED ORDER — LACTATED RINGERS IV BOLUS
1000.0000 mL | Freq: Once | INTRAVENOUS | Status: AC
  Administered 2020-09-29: 1000 mL via INTRAVENOUS

## 2020-09-29 MED ORDER — POTASSIUM CHLORIDE 20 MEQ/15ML (10%) PO SOLN
40.0000 meq | ORAL | Status: DC
  Filled 2020-09-29: qty 30

## 2020-09-29 MED ORDER — POTASSIUM PHOSPHATES 15 MMOLE/5ML IV SOLN
30.0000 mmol | Freq: Once | INTRAVENOUS | Status: AC
  Administered 2020-09-29: 30 mmol via INTRAVENOUS
  Filled 2020-09-29: qty 10

## 2020-09-29 MED ORDER — METOLAZONE 10 MG PO TABS: 10.0000 mg | ORAL_TABLET | Freq: Once | ORAL | Status: DC

## 2020-09-29 MED ORDER — MAGNESIUM SULFATE 2 GM/50ML IV SOLN
2.0000 g | Freq: Once | INTRAVENOUS | Status: AC
  Administered 2020-09-29: 2 g via INTRAVENOUS
  Filled 2020-09-29: qty 50

## 2020-09-29 MED ORDER — LACTATED RINGERS IV SOLN
Freq: Four times a day (QID) | INTRAVENOUS | Status: DC
  Administered 2020-09-29: 1500 mL via INTRAVENOUS
  Administered 2020-09-29: 1150 mL via INTRAVENOUS
  Administered 2020-09-30: 1410 mL via INTRAVENOUS
  Administered 2020-09-30: 1700 mL via INTRAVENOUS
  Administered 2020-09-30: 1425 mL via INTRAVENOUS
  Administered 2020-09-30: 1165 mL via INTRAVENOUS
  Administered 2020-10-01: 1800 mL via INTRAVENOUS
  Administered 2020-10-01: 1580 mL via INTRAVENOUS
  Administered 2020-10-01: 1640 mL via INTRAVENOUS
  Administered 2020-10-01: 1550 mL via INTRAVENOUS
  Administered 2020-10-02: 1625 mL via INTRAVENOUS
  Administered 2020-10-02: 575 mL via INTRAVENOUS
  Administered 2020-10-02: 250 mL via INTRAVENOUS
  Administered 2020-10-02: 700 mL via INTRAVENOUS
  Administered 2020-10-03: 550 mL via INTRAVENOUS
  Administered 2020-10-03: 650 mL via INTRAVENOUS
  Administered 2020-10-03: 2100 mL via INTRAVENOUS
  Administered 2020-10-03: 500 mL via INTRAVENOUS
  Administered 2020-10-03: 2425 mL via INTRAVENOUS
  Administered 2020-10-03: 1325 mL via INTRAVENOUS
  Administered 2020-10-04: 2100 mL via INTRAVENOUS
  Administered 2020-10-04: 800 mL via INTRAVENOUS
  Administered 2020-10-04: 900 mL via INTRAVENOUS
  Administered 2020-10-04: 763 mL via INTRAVENOUS
  Administered 2020-10-05: 181 mL via INTRAVENOUS
  Administered 2020-10-05: 675 mL via INTRAVENOUS
  Administered 2020-10-05: 215 mL via INTRAVENOUS
  Administered 2020-10-05: 315 mL via INTRAVENOUS
  Administered 2020-10-06: 44 mL via INTRAVENOUS

## 2020-09-29 MED ORDER — FENTANYL CITRATE (PF) 2500 MCG/50ML IJ SOLN
50.0000 ug/h | Status: DC
  Administered 2020-09-29: 75 ug/h via INTRAVENOUS
  Administered 2020-09-30: 100 ug/h via INTRAVENOUS
  Administered 2020-10-03: 150 ug/h via INTRAVENOUS
  Administered 2020-10-05: 75 ug/h via INTRAVENOUS
  Administered 2020-10-07: 100 ug/h via INTRAVENOUS
  Administered 2020-10-09 – 2020-10-11 (×2): 50 ug/h via INTRAVENOUS
  Administered 2020-10-13: 125 ug/h via INTRAVENOUS
  Administered 2020-10-15: 75 ug/h via INTRAVENOUS
  Filled 2020-09-29 (×7): qty 100
  Filled 2020-09-29: qty 50
  Filled 2020-09-29 (×6): qty 100
  Filled 2020-09-29: qty 50

## 2020-09-29 MED ORDER — IPRATROPIUM-ALBUTEROL 0.5-2.5 (3) MG/3ML IN SOLN
3.0000 mL | Freq: Four times a day (QID) | RESPIRATORY_TRACT | Status: AC
  Administered 2020-09-29 – 2020-10-01 (×8): 3 mL via RESPIRATORY_TRACT
  Filled 2020-09-29 (×8): qty 3

## 2020-09-29 MED ORDER — NOREPINEPHRINE 16 MG/250ML-% IV SOLN
0.0000 ug/min | INTRAVENOUS | Status: DC
  Administered 2020-09-29: 8 ug/min via INTRAVENOUS
  Administered 2020-09-30: 24 ug/min via INTRAVENOUS
  Administered 2020-10-01: 12 ug/min via INTRAVENOUS
  Administered 2020-10-02: 18 ug/min via INTRAVENOUS
  Administered 2020-10-07 – 2020-10-13 (×2): 2 ug/min via INTRAVENOUS
  Filled 2020-09-29 (×7): qty 250

## 2020-09-29 MED ORDER — POTASSIUM CHLORIDE 20 MEQ PO PACK
40.0000 meq | PACK | ORAL | Status: AC
  Administered 2020-09-29 (×2): 40 meq
  Filled 2020-09-29 (×2): qty 2

## 2020-09-29 MED ORDER — POTASSIUM CHLORIDE 20 MEQ PO PACK
40.0000 meq | PACK | ORAL | Status: DC
  Administered 2020-09-29: 40 meq
  Filled 2020-09-29: qty 2

## 2020-09-29 MED ORDER — VITAL HIGH PROTEIN PO LIQD: 1000.0000 mL | ORAL | Status: DC

## 2020-09-29 MED ORDER — POTASSIUM CHLORIDE 20 MEQ/15ML (10%) PO SOLN: 40.0000 meq | ORAL | Status: DC

## 2020-09-29 MED ORDER — PIVOT 1.5 CAL PO LIQD
1000.0000 mL | ORAL | Status: DC
  Administered 2020-09-29 – 2020-09-30 (×2): 1000 mL

## 2020-09-29 MED ORDER — LACTATED RINGERS IV SOLN: INTRAVENOUS | Status: DC

## 2020-09-29 NOTE — Procedures (Signed)
Cortrak  Person Inserting Tube:  Mychaela Lennartz E, RD Tube Type:  Cortrak - 43 inches Tube Location:  Right nare Initial Placement:  Stomach Secured by: Bridle Technique Used to Measure Tube Placement:  Documented cm marking at nare/ corner of mouth Cortrak Secured At:  75 cm    Cortrak Tube Team Note:  Consult received to place a Cortrak feeding tube.   No x-ray is required. RN may begin using tube.    If the tube becomes dislodged please keep the tube and contact the Cortrak team at [email or web address] (password [email or web address]) for replacement.  If after hours and replacement cannot be delayed, place a NG tube and confirm placement with an abdominal x-ray.    Abed Schar, MS, RD, LDN RD pager number and weekend/on-call pager number located in Amion.  

## 2020-09-29 NOTE — Progress Notes (Signed)
RT note-changed patient to wean, and desat to 75%. Placed back to full support and increased fio2 to 60%. Nebulizer given, RN aware, MD at the bedside. Continue to monitor.
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
## 2020-09-30 ENCOUNTER — Inpatient Hospital Stay (HOSPITAL_COMMUNITY): Payer: Medicaid Other

## 2020-09-30 DIAGNOSIS — D62 Acute posthemorrhagic anemia: Secondary | ICD-10-CM | POA: Diagnosis not present

## 2020-09-30 DIAGNOSIS — J9600 Acute respiratory failure, unspecified whether with hypoxia or hypercapnia: Secondary | ICD-10-CM | POA: Diagnosis not present

## 2020-09-30 DIAGNOSIS — N179 Acute kidney failure, unspecified: Secondary | ICD-10-CM | POA: Diagnosis not present

## 2020-09-30 LAB — CBC
HCT: 23.6 % — ABNORMAL LOW (ref 36.0–46.0)
Hemoglobin: 7.5 g/dL — ABNORMAL LOW (ref 12.0–15.0)
MCH: 30 pg (ref 26.0–34.0)
MCHC: 31.8 g/dL (ref 30.0–36.0)
MCV: 94.4 fL (ref 80.0–100.0)
Platelets: 222 10*3/uL (ref 150–400)
RBC: 2.5 MIL/uL — ABNORMAL LOW (ref 3.87–5.11)
RDW: 16.7 % — ABNORMAL HIGH (ref 11.5–15.5)
WBC: 15.1 10*3/uL — ABNORMAL HIGH (ref 4.0–10.5)
nRBC: 2.5 % — ABNORMAL HIGH (ref 0.0–0.2)

## 2020-09-30 LAB — CULTURE, BLOOD (ROUTINE X 2)

## 2020-09-30 LAB — BASIC METABOLIC PANEL
Anion gap: 8 (ref 5–15)
Anion gap: 6 (ref 5–15)
BUN: 24 mg/dL — ABNORMAL HIGH (ref 6–20)
BUN: 27 mg/dL — ABNORMAL HIGH (ref 6–20)
CO2: 21 mmol/L — ABNORMAL LOW (ref 22–32)
CO2: 21 mmol/L — ABNORMAL LOW (ref 22–32)
Calcium: 7.5 mg/dL — ABNORMAL LOW (ref 8.9–10.3)
Calcium: 7.2 mg/dL — ABNORMAL LOW (ref 8.9–10.3)
Chloride: 114 mmol/L — ABNORMAL HIGH (ref 98–111)
Chloride: 113 mmol/L — ABNORMAL HIGH (ref 98–111)
Creatinine, Ser: 1.48 mg/dL — ABNORMAL HIGH (ref 0.44–1.00)
Creatinine, Ser: 1.54 mg/dL — ABNORMAL HIGH (ref 0.44–1.00)
GFR calc Af Amer: 47 mL/min — ABNORMAL LOW (ref >60)
GFR calc Af Amer: 45 mL/min — ABNORMAL LOW (ref >60)
GFR calc non Af Amer: 40 mL/min — ABNORMAL LOW (ref >60)
GFR calc non Af Amer: 38 mL/min — ABNORMAL LOW (ref >60)
Glucose, Bld: 150 mg/dL — ABNORMAL HIGH (ref 70–99)
Glucose, Bld: 143 mg/dL — ABNORMAL HIGH (ref 70–99)
Potassium: 4.1 mmol/L (ref 3.5–5.1)
Potassium: 3.8 mmol/L (ref 3.5–5.1)
Sodium: 143 mmol/L (ref 135–145)
Sodium: 140 mmol/L (ref 135–145)

## 2020-09-30 LAB — VITAMIN D 25 HYDROXY (VIT D DEFICIENCY, FRACTURES): Vit D, 25-Hydroxy: 24.09 ng/mL — ABNORMAL LOW (ref 30–100)

## 2020-09-30 LAB — PHOSPHORUS: Phosphorus: 2.4 mg/dL — ABNORMAL LOW (ref 2.5–4.6)

## 2020-09-30 LAB — GLUCOSE, CAPILLARY
Glucose-Capillary: 131 mg/dL — ABNORMAL HIGH (ref 70–99)
Glucose-Capillary: 139 mg/dL — ABNORMAL HIGH (ref 70–99)
Glucose-Capillary: 154 mg/dL — ABNORMAL HIGH (ref 70–99)
Glucose-Capillary: 122 mg/dL — ABNORMAL HIGH (ref 70–99)
Glucose-Capillary: 143 mg/dL — ABNORMAL HIGH (ref 70–99)
Glucose-Capillary: 114 mg/dL — ABNORMAL HIGH (ref 70–99)

## 2020-09-30 LAB — MAGNESIUM: Magnesium: 1.9 mg/dL (ref 1.7–2.4)

## 2020-09-30 LAB — CALCIUM, IONIZED: Calcium, Ionized, Serum: 4.4 mg/dL — ABNORMAL LOW (ref 4.5–5.6)

## 2020-09-30 MED ORDER — ALBUMIN HUMAN 5 % IV SOLN
12.5000 g | Freq: Once | INTRAVENOUS | Status: AC
  Administered 2020-09-30: 12.5 g via INTRAVENOUS
  Filled 2020-09-30: qty 250
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
## 2020-10-01 ENCOUNTER — Inpatient Hospital Stay (HOSPITAL_COMMUNITY): Payer: Medicaid Other

## 2020-10-01 DIAGNOSIS — S2243XA Multiple fractures of ribs, bilateral, initial encounter for closed fracture: Secondary | ICD-10-CM | POA: Diagnosis not present

## 2020-10-01 DIAGNOSIS — J9600 Acute respiratory failure, unspecified whether with hypoxia or hypercapnia: Secondary | ICD-10-CM | POA: Diagnosis not present

## 2020-10-01 DIAGNOSIS — N179 Acute kidney failure, unspecified: Secondary | ICD-10-CM | POA: Diagnosis not present

## 2020-10-01 DIAGNOSIS — D62 Acute posthemorrhagic anemia: Secondary | ICD-10-CM | POA: Diagnosis not present

## 2020-10-01 LAB — COMPREHENSIVE METABOLIC PANEL
ALT: 35 U/L (ref 0–44)
AST: 80 U/L — ABNORMAL HIGH (ref 15–41)
Albumin: 1.3 g/dL — ABNORMAL LOW (ref 3.5–5.0)
Alkaline Phosphatase: 75 U/L (ref 38–126)
Anion gap: 7 (ref 5–15)
BUN: 26 mg/dL — ABNORMAL HIGH (ref 6–20)
CO2: 22 mmol/L (ref 22–32)
Calcium: 7.5 mg/dL — ABNORMAL LOW (ref 8.9–10.3)
Chloride: 113 mmol/L — ABNORMAL HIGH (ref 98–111)
Creatinine, Ser: 1.46 mg/dL — ABNORMAL HIGH (ref 0.44–1.00)
GFR calc Af Amer: 47 mL/min — ABNORMAL LOW (ref >60)
GFR calc non Af Amer: 41 mL/min — ABNORMAL LOW (ref >60)
Glucose, Bld: 113 mg/dL — ABNORMAL HIGH (ref 70–99)
Potassium: 4 mmol/L (ref 3.5–5.1)
Sodium: 142 mmol/L (ref 135–145)
Total Bilirubin: 7.5 mg/dL — ABNORMAL HIGH (ref 0.3–1.2)
Total Protein: 3.8 g/dL — ABNORMAL LOW (ref 6.5–8.1)

## 2020-10-01 LAB — PHOSPHORUS: Phosphorus: 2.7 mg/dL (ref 2.5–4.6)

## 2020-10-01 LAB — CBC
HCT: 24 % — ABNORMAL LOW (ref 36.0–46.0)
Hemoglobin: 7.6 g/dL — ABNORMAL LOW (ref 12.0–15.0)
MCH: 30.2 pg (ref 26.0–34.0)
MCHC: 31.7 g/dL (ref 30.0–36.0)
MCV: 95.2 fL (ref 80.0–100.0)
Platelets: 268 10*3/uL (ref 150–400)
RBC: 2.52 MIL/uL — ABNORMAL LOW (ref 3.87–5.11)
RDW: 17.2 % — ABNORMAL HIGH (ref 11.5–15.5)
WBC: 16.9 10*3/uL — ABNORMAL HIGH (ref 4.0–10.5)
nRBC: 1.4 % — ABNORMAL HIGH (ref 0.0–0.2)

## 2020-10-01 LAB — AEROBIC/ANAEROBIC CULTURE W GRAM STAIN (SURGICAL/DEEP WOUND): Culture: NO GROWTH

## 2020-10-01 LAB — GLUCOSE, CAPILLARY
Glucose-Capillary: 109 mg/dL — ABNORMAL HIGH (ref 70–99)
Glucose-Capillary: 120 mg/dL — ABNORMAL HIGH (ref 70–99)
Glucose-Capillary: 131 mg/dL — ABNORMAL HIGH (ref 70–99)
Glucose-Capillary: 140 mg/dL — ABNORMAL HIGH (ref 70–99)
Glucose-Capillary: 142 mg/dL — ABNORMAL HIGH (ref 70–99)
Glucose-Capillary: 165 mg/dL — ABNORMAL HIGH (ref 70–99)

## 2020-10-01 LAB — MAGNESIUM: Magnesium: 1.5 mg/dL — ABNORMAL LOW (ref 1.7–2.4)

## 2020-10-01 MED ORDER — MAGNESIUM SULFATE 4 GM/100ML IV SOLN
4.0000 g | Freq: Once | INTRAVENOUS | Status: AC
  Administered 2020-10-01: 4 g via INTRAVENOUS
  Filled 2020-10-01: qty 100

## 2020-10-01 MED ORDER — IPRATROPIUM-ALBUTEROL 0.5-2.5 (3) MG/3ML IN SOLN
3.0000 mL | Freq: Four times a day (QID) | RESPIRATORY_TRACT | Status: DC
  Administered 2020-10-01 – 2020-10-07 (×26): 3 mL via RESPIRATORY_TRACT
  Filled 2020-10-01 (×27): qty 3

## 2020-10-01 MED ORDER — PIVOT 1.5 CAL PO LIQD
1000.0000 mL | ORAL | Status: DC
  Administered 2020-10-01 – 2020-10-02 (×2): 1000 mL
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
## 2020-10-01 NOTE — Progress Notes (Signed)
Called and spoke with Trauma MD Dwain Sarna regarding patient's desating throughout the night.  Patient is currently sating 91% on 100%FiO2.  Chest xray was ordered and completed.  MD is aware and ordered that we leave the patient at 100%FiO2 and raise the PeeP from 5-8.  Will continue to observe and act accordingly.

## 2020-10-02 ENCOUNTER — Inpatient Hospital Stay (HOSPITAL_COMMUNITY): Payer: Medicaid Other

## 2020-10-02 ENCOUNTER — Inpatient Hospital Stay: Payer: Self-pay

## 2020-10-02 ENCOUNTER — Other Ambulatory Visit: Payer: Medicaid Other

## 2020-10-02 DIAGNOSIS — D62 Acute posthemorrhagic anemia: Secondary | ICD-10-CM | POA: Diagnosis not present

## 2020-10-02 DIAGNOSIS — S2243XA Multiple fractures of ribs, bilateral, initial encounter for closed fracture: Secondary | ICD-10-CM | POA: Diagnosis not present

## 2020-10-02 DIAGNOSIS — N179 Acute kidney failure, unspecified: Secondary | ICD-10-CM | POA: Diagnosis not present

## 2020-10-02 DIAGNOSIS — J9600 Acute respiratory failure, unspecified whether with hypoxia or hypercapnia: Secondary | ICD-10-CM | POA: Diagnosis not present

## 2020-10-02 LAB — URINALYSIS, ROUTINE W REFLEX MICROSCOPIC
Cellular Cast, UA: 37
Glucose, UA: NEGATIVE mg/dL
Ketones, ur: NEGATIVE mg/dL
Nitrite: NEGATIVE
Protein, ur: 100 mg/dL — AB
RBC / HPF: >50 RBC/hpf — ABNORMAL HIGH (ref 0–5)
Specific Gravity, Urine: 1.016 (ref 1.005–1.030)
pH: 5 (ref 5.0–8.0)

## 2020-10-02 LAB — CBC
HCT: 24.7 % — ABNORMAL LOW (ref 36.0–46.0)
Hemoglobin: 7.8 g/dL — ABNORMAL LOW (ref 12.0–15.0)
MCH: 30.6 pg (ref 26.0–34.0)
MCHC: 31.6 g/dL (ref 30.0–36.0)
MCV: 96.9 fL (ref 80.0–100.0)
Platelets: 340 10*3/uL (ref 150–400)
RBC: 2.55 MIL/uL — ABNORMAL LOW (ref 3.87–5.11)
RDW: 18.5 % — ABNORMAL HIGH (ref 11.5–15.5)
WBC: 23.1 10*3/uL — ABNORMAL HIGH (ref 4.0–10.5)
nRBC: 0.5 % — ABNORMAL HIGH (ref 0.0–0.2)

## 2020-10-02 LAB — GLUCOSE, CAPILLARY
Glucose-Capillary: 154 mg/dL — ABNORMAL HIGH (ref 70–99)
Glucose-Capillary: 150 mg/dL — ABNORMAL HIGH (ref 70–99)
Glucose-Capillary: 136 mg/dL — ABNORMAL HIGH (ref 70–99)
Glucose-Capillary: 141 mg/dL — ABNORMAL HIGH (ref 70–99)
Glucose-Capillary: 138 mg/dL — ABNORMAL HIGH (ref 70–99)

## 2020-10-02 LAB — BASIC METABOLIC PANEL
Anion gap: 6 (ref 5–15)
BUN: 38 mg/dL — ABNORMAL HIGH (ref 6–20)
CO2: 23 mmol/L (ref 22–32)
Calcium: 7.5 mg/dL — ABNORMAL LOW (ref 8.9–10.3)
Chloride: 111 mmol/L (ref 98–111)
Creatinine, Ser: 1.56 mg/dL — ABNORMAL HIGH (ref 0.44–1.00)
GFR calc Af Amer: 44 mL/min — ABNORMAL LOW (ref >60)
GFR calc non Af Amer: 38 mL/min — ABNORMAL LOW (ref >60)
Glucose, Bld: 149 mg/dL — ABNORMAL HIGH (ref 70–99)
Potassium: 4.4 mmol/L (ref 3.5–5.1)
Sodium: 140 mmol/L (ref 135–145)

## 2020-10-02 LAB — PHOSPHORUS: Phosphorus: 2.6 mg/dL (ref 2.5–4.6)

## 2020-10-02 LAB — ACTH STIMULATION, 3 TIME POINTS
Cortisol, 30 Min: 35 ug/dL
Cortisol, 60 Min: 38.3 ug/dL
Cortisol, Base: 28.5 ug/dL

## 2020-10-02 LAB — RESPIRATORY PANEL BY RT PCR (FLU A&B, COVID)
Influenza A by PCR: NEGATIVE
Influenza B by PCR: NEGATIVE
SARS Coronavirus 2 by RT PCR: NEGATIVE

## 2020-10-02 LAB — MAGNESIUM: Magnesium: 2.4 mg/dL (ref 1.7–2.4)

## 2020-10-02 MED ORDER — IOHEXOL 300 MG/ML  SOLN
100.0000 mL | Freq: Once | INTRAMUSCULAR | Status: AC | PRN
  Administered 2020-10-02: 100 mL via INTRAVENOUS

## 2020-10-02 MED ORDER — VASOPRESSIN 20 UNITS/100 ML INFUSION FOR SHOCK
0.0400 [IU]/min | INTRAVENOUS | Status: DC
  Administered 2020-10-02 – 2020-10-16 (×30): 0.04 [IU]/min via INTRAVENOUS
  Filled 2020-10-02 (×39): qty 100

## 2020-10-02 MED ORDER — COSYNTROPIN 0.25 MG IJ SOLR
0.2500 mg | Freq: Once | INTRAMUSCULAR | Status: AC
  Administered 2020-10-02: 0.25 mg via INTRAVENOUS
  Filled 2020-10-02: qty 0.25

## 2020-10-02 NOTE — Plan of Care (Signed)
°  Problem: Clinical Measurements: Goal: Cardiovascular complication will be avoided Outcome: Progressing   Problem: Nutrition: Goal: Adequate nutrition will be maintained Outcome: Progressing   Problem: Coping: Goal: Level of anxiety will decrease Outcome: Progressing   Problem: Elimination: Goal: Will not experience complications related to bowel motility Outcome: Progressing   Problem: Pain Managment: Goal: General experience of comfort will improve Outcome: Progressing   Problem: Safety: Goal: Ability to remain free from injury will improve Outcome: Progressing   Problem: Skin Integrity: Goal: Risk for impaired skin integrity will decrease Outcome: Progressing   Problem: Respiratory: Goal: Ability to maintain a clear airway and adequate ventilation will improve Outcome: Progressing   Problem: Pain Management: Goal: Pain level will decrease Outcome: Progressing   Problem: Activity: Goal: Will remain free from falls Outcome: Progressing   Problem: Respiratory: Goal: Ability to maintain adequate ventilation will improve Outcome: Progressing

## 2020-10-02 NOTE — Progress Notes (Signed)
Pt transported from 4N21 to CT and back with no complications noted.

## 2020-10-02 NOTE — Progress Notes (Signed)
Patient transported from 4N21 to 4N25 without complications.

## 2020-10-02 NOTE — Progress Notes (Signed)
Peripherally Inserted Central Catheter Placement  The IV Nurse has discussed with the patient and/or persons authorized to consent for the patient, the purpose of this procedure and the potential benefits and risks involved with this procedure.  The benefits include less needle sticks, lab draws from the catheter, and the patient may be discharged home with the catheter. Risks include, but not limited to, infection, bleeding, blood clot (thrombus formation), and puncture of an artery; nerve damage and irregular heartbeat and possibility to perform a PICC exchange if needed/ordered by physician.  Alternatives to this procedure were also discussed.  Bard Power PICC patient education guide, fact sheet on infection prevention and patient information card has been provided to patient /or left at bedside.    PICC Placement Documentation  PICC Triple Lumen 10/02/20 PICC Left Brachial 44 cm 0 cm (Active)  Indication for Insertion or Continuance of Line Prolonged intravenous therapies 10/02/20 1500  Exposed Catheter (cm) 0 cm 10/02/20 1500  Site Assessment Clean;Dry;Intact 10/02/20 1500  Lumen #1 Status Flushed;Saline locked;Blood return noted 10/02/20 1500  Lumen #2 Status Flushed;Saline locked;Blood return noted 10/02/20 1500  Lumen #3 Status Flushed;Saline locked;Blood return noted 10/02/20 1500  Dressing Type Transparent;Securing device 10/02/20 1500  Dressing Status Clean;Dry;Intact 10/02/20 1500  Antimicrobial disc in place? Yes 10/02/20 1500  Line Care Connections checked and tightened 10/02/20 1500  Dressing Intervention New dressing 10/02/20 1500  Dressing Change Due 10/09/20 10/02/20 1500       Franne Grip Renee 10/02/2020, 3:32 PM
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
## 2020-10-03 ENCOUNTER — Encounter (HOSPITAL_COMMUNITY): Payer: Self-pay

## 2020-10-03 DIAGNOSIS — S2243XA Multiple fractures of ribs, bilateral, initial encounter for closed fracture: Secondary | ICD-10-CM | POA: Diagnosis not present

## 2020-10-03 DIAGNOSIS — J9600 Acute respiratory failure, unspecified whether with hypoxia or hypercapnia: Secondary | ICD-10-CM | POA: Diagnosis not present

## 2020-10-03 DIAGNOSIS — N179 Acute kidney failure, unspecified: Secondary | ICD-10-CM | POA: Diagnosis not present

## 2020-10-03 DIAGNOSIS — D62 Acute posthemorrhagic anemia: Secondary | ICD-10-CM | POA: Diagnosis not present

## 2020-10-03 LAB — GLUCOSE, CAPILLARY
Glucose-Capillary: 118 mg/dL — ABNORMAL HIGH (ref 70–99)
Glucose-Capillary: 128 mg/dL — ABNORMAL HIGH (ref 70–99)
Glucose-Capillary: 130 mg/dL — ABNORMAL HIGH (ref 70–99)
Glucose-Capillary: 137 mg/dL — ABNORMAL HIGH (ref 70–99)
Glucose-Capillary: 138 mg/dL — ABNORMAL HIGH (ref 70–99)
Glucose-Capillary: 148 mg/dL — ABNORMAL HIGH (ref 70–99)
Glucose-Capillary: 156 mg/dL — ABNORMAL HIGH (ref 70–99)

## 2020-10-03 LAB — CBC
HCT: 21.8 % — ABNORMAL LOW (ref 36.0–46.0)
Hemoglobin: 6.6 g/dL — CL (ref 12.0–15.0)
MCH: 29.9 pg (ref 26.0–34.0)
MCHC: 30.3 g/dL (ref 30.0–36.0)
MCV: 98.6 fL (ref 80.0–100.0)
Platelets: 328 10*3/uL (ref 150–400)
RBC: 2.21 MIL/uL — ABNORMAL LOW (ref 3.87–5.11)
RDW: 19.3 % — ABNORMAL HIGH (ref 11.5–15.5)
WBC: 18.5 10*3/uL — ABNORMAL HIGH (ref 4.0–10.5)
nRBC: 0.5 % — ABNORMAL HIGH (ref 0.0–0.2)

## 2020-10-03 LAB — COMPREHENSIVE METABOLIC PANEL
ALT: 41 U/L (ref 0–44)
AST: 97 U/L — ABNORMAL HIGH (ref 15–41)
Albumin: <1 g/dL — ABNORMAL LOW (ref 3.5–5.0)
Alkaline Phosphatase: 92 U/L (ref 38–126)
Anion gap: 9 (ref 5–15)
BUN: 43 mg/dL — ABNORMAL HIGH (ref 6–20)
CO2: 20 mmol/L — ABNORMAL LOW (ref 22–32)
Calcium: 7.1 mg/dL — ABNORMAL LOW (ref 8.9–10.3)
Chloride: 109 mmol/L (ref 98–111)
Creatinine, Ser: 1.4 mg/dL — ABNORMAL HIGH (ref 0.44–1.00)
GFR calc Af Amer: 50 mL/min — ABNORMAL LOW (ref >60)
GFR calc non Af Amer: 43 mL/min — ABNORMAL LOW (ref >60)
Glucose, Bld: 141 mg/dL — ABNORMAL HIGH (ref 70–99)
Potassium: 4.4 mmol/L (ref 3.5–5.1)
Sodium: 138 mmol/L (ref 135–145)
Total Bilirubin: 3.3 mg/dL — ABNORMAL HIGH (ref 0.3–1.2)
Total Protein: 3.7 g/dL — ABNORMAL LOW (ref 6.5–8.1)

## 2020-10-03 LAB — MAGNESIUM: Magnesium: 1.8 mg/dL (ref 1.7–2.4)

## 2020-10-03 LAB — PREPARE RBC (CROSSMATCH)

## 2020-10-03 LAB — PHOSPHORUS: Phosphorus: 3 mg/dL (ref 2.5–4.6)

## 2020-10-03 MED ORDER — SODIUM CHLORIDE 0.9% IV SOLUTION: Freq: Once | INTRAVENOUS | Status: DC

## 2020-10-03 MED ORDER — PROSOURCE TF PO LIQD
45.0000 mL | Freq: Three times a day (TID) | ORAL | Status: DC
  Administered 2020-10-03 – 2020-10-17 (×42): 45 mL
  Filled 2020-10-03 (×42): qty 45

## 2020-10-03 MED ORDER — PIVOT 1.5 CAL PO LIQD
1000.0000 mL | ORAL | Status: DC
  Administered 2020-10-04 – 2020-10-17 (×17): 1000 mL
  Filled 2020-10-03 (×6): qty 1000

## 2020-10-03 MED ORDER — FUROSEMIDE 10 MG/ML IJ SOLN
20.0000 mg | Freq: Once | INTRAMUSCULAR | Status: AC
  Administered 2020-10-03: 20 mg via INTRAVENOUS
  Filled 2020-10-03: qty 2

## 2020-10-03 MED ORDER — MAGNESIUM SULFATE IN D5W 1-5 GM/100ML-% IV SOLN
1.0000 g | Freq: Once | INTRAVENOUS | Status: AC
  Administered 2020-10-03: 1 g via INTRAVENOUS
  Filled 2020-10-03: qty 100

## 2020-10-03 NOTE — Progress Notes (Signed)
Fio2 increased to 80%, peep increased to 12 per Elink.
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
## 2020-10-03 NOTE — Consult Note (Signed)
WOC Nurse ostomy follow up Patient receiving care in [facility] 4N25. Primary RN in room. Patient remains intubated, sedated. Stoma type/location: LUQ colostomy Stomal assessment/size: deferred, no ostomy care supplies in room. Supplies to be ordered by [place]. Peristomal assessment: deferred; existing pouching system intact, no signs of leakage or washout. Treatment options for stomal/peristomal skin: barrier ring Output: soft brown  Ostomy pouching: 2pc. 2 and 3/4 inches, barrier ring. Pouch #649, skin barrier #2, barrier ring [phone] Education provided: none Enrolled patient in [employer] Start Discharge program: No Helmut Muster, RN, MSN, CWOCN, CNS-BC, pager [phone]
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
## 2020-10-04 DIAGNOSIS — J9600 Acute respiratory failure, unspecified whether with hypoxia or hypercapnia: Secondary | ICD-10-CM | POA: Diagnosis not present

## 2020-10-04 DIAGNOSIS — N179 Acute kidney failure, unspecified: Secondary | ICD-10-CM | POA: Diagnosis not present

## 2020-10-04 DIAGNOSIS — D62 Acute posthemorrhagic anemia: Secondary | ICD-10-CM | POA: Diagnosis not present

## 2020-10-04 DIAGNOSIS — S2243XA Multiple fractures of ribs, bilateral, initial encounter for closed fracture: Secondary | ICD-10-CM | POA: Diagnosis not present

## 2020-10-04 LAB — TYPE AND SCREEN
ABO/RH(D): O POS
Antibody Screen: NEGATIVE
Unit division: 0
Unit division: 0

## 2020-10-04 LAB — CBC
HCT: 28.6 % — ABNORMAL LOW (ref 36.0–46.0)
Hemoglobin: 9 g/dL — ABNORMAL LOW (ref 12.0–15.0)
MCH: 29.9 pg (ref 26.0–34.0)
MCHC: 31.5 g/dL (ref 30.0–36.0)
MCV: 95 fL (ref 80.0–100.0)
Platelets: 320 10*3/uL (ref 150–400)
RBC: 3.01 MIL/uL — ABNORMAL LOW (ref 3.87–5.11)
RDW: 18.6 % — ABNORMAL HIGH (ref 11.5–15.5)
WBC: 15.1 10*3/uL — ABNORMAL HIGH (ref 4.0–10.5)
nRBC: 0.9 % — ABNORMAL HIGH (ref 0.0–0.2)

## 2020-10-04 LAB — COMPREHENSIVE METABOLIC PANEL
ALT: 53 U/L — ABNORMAL HIGH (ref 0–44)
AST: 109 U/L — ABNORMAL HIGH (ref 15–41)
Albumin: <1 g/dL — ABNORMAL LOW (ref 3.5–5.0)
Alkaline Phosphatase: 111 U/L (ref 38–126)
Anion gap: 9 (ref 5–15)
BUN: 60 mg/dL — ABNORMAL HIGH (ref 6–20)
CO2: 22 mmol/L (ref 22–32)
Calcium: 7.5 mg/dL — ABNORMAL LOW (ref 8.9–10.3)
Chloride: 106 mmol/L (ref 98–111)
Creatinine, Ser: 1.73 mg/dL — ABNORMAL HIGH (ref 0.44–1.00)
GFR calc non Af Amer: 33 mL/min — ABNORMAL LOW (ref >60)
Glucose, Bld: 129 mg/dL — ABNORMAL HIGH (ref 70–99)
Potassium: 4.8 mmol/L (ref 3.5–5.1)
Sodium: 137 mmol/L (ref 135–145)
Total Bilirubin: 3.4 mg/dL — ABNORMAL HIGH (ref 0.3–1.2)
Total Protein: 4.5 g/dL — ABNORMAL LOW (ref 6.5–8.1)

## 2020-10-04 LAB — CULTURE, BLOOD (ROUTINE X 2)
Culture: NO GROWTH
Culture: NO GROWTH
Special Requests: ADEQUATE

## 2020-10-04 LAB — GLUCOSE, CAPILLARY
Glucose-Capillary: 134 mg/dL — ABNORMAL HIGH (ref 70–99)
Glucose-Capillary: 142 mg/dL — ABNORMAL HIGH (ref 70–99)
Glucose-Capillary: 142 mg/dL — ABNORMAL HIGH (ref 70–99)
Glucose-Capillary: 145 mg/dL — ABNORMAL HIGH (ref 70–99)
Glucose-Capillary: 148 mg/dL — ABNORMAL HIGH (ref 70–99)
Glucose-Capillary: 152 mg/dL — ABNORMAL HIGH (ref 70–99)

## 2020-10-04 LAB — MAGNESIUM: Magnesium: 2 mg/dL (ref 1.7–2.4)

## 2020-10-04 LAB — BASIC METABOLIC PANEL
Anion gap: 8 (ref 5–15)
BUN: 64 mg/dL — ABNORMAL HIGH (ref 6–20)
CO2: 23 mmol/L (ref 22–32)
Calcium: 7.5 mg/dL — ABNORMAL LOW (ref 8.9–10.3)
Chloride: 106 mmol/L (ref 98–111)
Creatinine, Ser: 1.77 mg/dL — ABNORMAL HIGH (ref 0.44–1.00)
GFR calc non Af Amer: 32 mL/min — ABNORMAL LOW (ref >60)
Glucose, Bld: 147 mg/dL — ABNORMAL HIGH (ref 70–99)
Potassium: 4.3 mmol/L (ref 3.5–5.1)
Sodium: 137 mmol/L (ref 135–145)

## 2020-10-04 LAB — PHOSPHORUS: Phosphorus: 4 mg/dL (ref 2.5–4.6)

## 2020-10-04 LAB — BPAM RBC
Blood Product Expiration Date: 202110292359
Blood Product Expiration Date: 202110292359
ISSUE DATE / TIME: 202110050826
ISSUE DATE / TIME: 202110050826
Unit Type and Rh: 5100
Unit Type and Rh: 5100

## 2020-10-04 MED ORDER — FUROSEMIDE 10 MG/ML IJ SOLN
20.0000 mg | Freq: Once | INTRAMUSCULAR | Status: AC
  Administered 2020-10-04: 20 mg via INTRAVENOUS
  Filled 2020-10-04: qty 2

## 2020-10-04 MED ORDER — FUROSEMIDE 10 MG/ML IJ SOLN
40.0000 mg | Freq: Once | INTRAMUSCULAR | Status: AC
  Administered 2020-10-04: 40 mg via INTRAVENOUS
  Filled 2020-10-04: qty 4

## 2020-10-04 MED ORDER — FREE WATER
100.0000 mL | Freq: Three times a day (TID) | Status: DC
  Administered 2020-10-04 – 2020-10-10 (×18): 100 mL
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
## 2020-10-05 DIAGNOSIS — S2243XA Multiple fractures of ribs, bilateral, initial encounter for closed fracture: Secondary | ICD-10-CM | POA: Diagnosis not present

## 2020-10-05 DIAGNOSIS — N179 Acute kidney failure, unspecified: Secondary | ICD-10-CM | POA: Diagnosis not present

## 2020-10-05 DIAGNOSIS — D62 Acute posthemorrhagic anemia: Secondary | ICD-10-CM | POA: Diagnosis not present

## 2020-10-05 DIAGNOSIS — J9600 Acute respiratory failure, unspecified whether with hypoxia or hypercapnia: Secondary | ICD-10-CM | POA: Diagnosis not present

## 2020-10-05 LAB — CBC
HCT: 27.1 % — ABNORMAL LOW (ref 36.0–46.0)
Hemoglobin: 8.5 g/dL — ABNORMAL LOW (ref 12.0–15.0)
MCH: 30.6 pg (ref 26.0–34.0)
MCHC: 31.4 g/dL (ref 30.0–36.0)
MCV: 97.5 fL (ref 80.0–100.0)
Platelets: 362 10*3/uL (ref 150–400)
RBC: 2.78 MIL/uL — ABNORMAL LOW (ref 3.87–5.11)
RDW: 17.9 % — ABNORMAL HIGH (ref 11.5–15.5)
WBC: 14.3 10*3/uL — ABNORMAL HIGH (ref 4.0–10.5)
nRBC: 0.5 % — ABNORMAL HIGH (ref 0.0–0.2)

## 2020-10-05 LAB — POCT I-STAT 7, (LYTES, BLD GAS, ICA,H+H)
Acid-base deficit: 1 mmol/L (ref 0.0–2.0)
Bicarbonate: 25.2 mmol/L (ref 20.0–28.0)
Calcium, Ion: 1.17 mmol/L (ref 1.15–1.40)
HCT: 27 % — ABNORMAL LOW (ref 36.0–46.0)
Hemoglobin: 9.2 g/dL — ABNORMAL LOW (ref 12.0–15.0)
O2 Saturation: 84 %
Patient temperature: 98.6
Potassium: 4.1 mmol/L (ref 3.5–5.1)
Sodium: 138 mmol/L (ref 135–145)
TCO2: 27 mmol/L (ref 22–32)
pCO2 arterial: 45.8 mmHg (ref 32.0–48.0)
pH, Arterial: 7.348 — ABNORMAL LOW (ref 7.350–7.450)
pO2, Arterial: 52 mmHg — ABNORMAL LOW (ref 83.0–108.0)

## 2020-10-05 LAB — CULTURE, RESPIRATORY W GRAM STAIN

## 2020-10-05 LAB — GLUCOSE, CAPILLARY
Glucose-Capillary: 128 mg/dL — ABNORMAL HIGH (ref 70–99)
Glucose-Capillary: 130 mg/dL — ABNORMAL HIGH (ref 70–99)
Glucose-Capillary: 137 mg/dL — ABNORMAL HIGH (ref 70–99)
Glucose-Capillary: 140 mg/dL — ABNORMAL HIGH (ref 70–99)
Glucose-Capillary: 142 mg/dL — ABNORMAL HIGH (ref 70–99)
Glucose-Capillary: 153 mg/dL — ABNORMAL HIGH (ref 70–99)

## 2020-10-05 LAB — COMPREHENSIVE METABOLIC PANEL
ALT: 68 U/L — ABNORMAL HIGH (ref 0–44)
AST: 121 U/L — ABNORMAL HIGH (ref 15–41)
Albumin: <1 g/dL — ABNORMAL LOW (ref 3.5–5.0)
Alkaline Phosphatase: 102 U/L (ref 38–126)
Anion gap: 11 (ref 5–15)
BUN: 76 mg/dL — ABNORMAL HIGH (ref 6–20)
CO2: 22 mmol/L (ref 22–32)
Calcium: 7.5 mg/dL — ABNORMAL LOW (ref 8.9–10.3)
Chloride: 105 mmol/L (ref 98–111)
Creatinine, Ser: 1.87 mg/dL — ABNORMAL HIGH (ref 0.44–1.00)
GFR calc non Af Amer: 30 mL/min — ABNORMAL LOW (ref >60)
Glucose, Bld: 148 mg/dL — ABNORMAL HIGH (ref 70–99)
Potassium: 4.2 mmol/L (ref 3.5–5.1)
Sodium: 138 mmol/L (ref 135–145)
Total Bilirubin: 2.9 mg/dL — ABNORMAL HIGH (ref 0.3–1.2)
Total Protein: 4.5 g/dL — ABNORMAL LOW (ref 6.5–8.1)

## 2020-10-05 LAB — BASIC METABOLIC PANEL
Anion gap: 9 (ref 5–15)
BUN: 81 mg/dL — ABNORMAL HIGH (ref 6–20)
CO2: 24 mmol/L (ref 22–32)
Calcium: 7.6 mg/dL — ABNORMAL LOW (ref 8.9–10.3)
Chloride: 107 mmol/L (ref 98–111)
Creatinine, Ser: 1.92 mg/dL — ABNORMAL HIGH (ref 0.44–1.00)
GFR calc non Af Amer: 29 mL/min — ABNORMAL LOW (ref >60)
Glucose, Bld: 183 mg/dL — ABNORMAL HIGH (ref 70–99)
Potassium: 3.9 mmol/L (ref 3.5–5.1)
Sodium: 140 mmol/L (ref 135–145)

## 2020-10-05 LAB — MAGNESIUM: Magnesium: 1.7 mg/dL (ref 1.7–2.4)

## 2020-10-05 LAB — PHOSPHORUS: Phosphorus: 4.5 mg/dL (ref 2.5–4.6)

## 2020-10-05 MED ORDER — MAGNESIUM SULFATE IN D5W 1-5 GM/100ML-% IV SOLN
1.0000 g | Freq: Once | INTRAVENOUS | Status: AC
  Administered 2020-10-05: 1 g via INTRAVENOUS
  Filled 2020-10-05: qty 100

## 2020-10-05 MED ORDER — FUROSEMIDE 10 MG/ML IJ SOLN
80.0000 mg | Freq: Once | INTRAMUSCULAR | Status: AC
  Administered 2020-10-05: 80 mg via INTRAVENOUS
  Filled 2020-10-05: qty 8

## 2020-10-05 MED ORDER — ONDANSETRON HCL 4 MG/2ML IJ SOLN
INTRAMUSCULAR | Status: AC
  Administered 2020-10-05: 4 mg via INTRAVENOUS
  Filled 2020-10-05: qty 2

## 2020-10-05 MED ORDER — ONDANSETRON HCL 4 MG/2ML IJ SOLN
4.0000 mg | Freq: Four times a day (QID) | INTRAMUSCULAR | Status: DC | PRN
  Administered 2020-10-05 – 2020-12-13 (×45): 4 mg via INTRAVENOUS
  Filled 2020-10-05 (×46): qty 2

## 2020-10-05 MED ORDER — SODIUM CHLORIDE 0.9 % IV SOLN
2.0000 g | Freq: Four times a day (QID) | INTRAVENOUS | Status: DC
  Administered 2020-10-05 – 2020-10-08 (×14): 2 g via INTRAVENOUS
  Filled 2020-10-05: qty 2
  Filled 2020-10-05 (×2): qty 2000
  Filled 2020-10-05: qty 2
  Filled 2020-10-05: qty 2000
  Filled 2020-10-05: qty 2
  Filled 2020-10-05: qty 2000
  Filled 2020-10-05: qty 2
  Filled 2020-10-05: qty 2000
  Filled 2020-10-05 (×7): qty 2

## 2020-10-05 MED ORDER — FUROSEMIDE 10 MG/ML IJ SOLN
60.0000 mg | Freq: Once | INTRAMUSCULAR | Status: AC
  Administered 2020-10-05: 60 mg via INTRAVENOUS
  Filled 2020-10-05: qty 6

## 2020-10-05 MED ORDER — ONDANSETRON HCL 4 MG/2ML IJ SOLN: 4.0000 mg | Freq: Four times a day (QID) | INTRAMUSCULAR | Status: DC
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
## 2020-10-06 DIAGNOSIS — J9600 Acute respiratory failure, unspecified whether with hypoxia or hypercapnia: Secondary | ICD-10-CM | POA: Diagnosis not present

## 2020-10-06 DIAGNOSIS — S2243XA Multiple fractures of ribs, bilateral, initial encounter for closed fracture: Secondary | ICD-10-CM | POA: Diagnosis not present

## 2020-10-06 DIAGNOSIS — D62 Acute posthemorrhagic anemia: Secondary | ICD-10-CM | POA: Diagnosis not present

## 2020-10-06 DIAGNOSIS — N179 Acute kidney failure, unspecified: Secondary | ICD-10-CM | POA: Diagnosis not present

## 2020-10-06 LAB — CBC
HCT: 28.2 % — ABNORMAL LOW (ref 36.0–46.0)
Hemoglobin: 8.8 g/dL — ABNORMAL LOW (ref 12.0–15.0)
MCH: 30.1 pg (ref 26.0–34.0)
MCHC: 31.2 g/dL (ref 30.0–36.0)
MCV: 96.6 fL (ref 80.0–100.0)
Platelets: 442 10*3/uL — ABNORMAL HIGH (ref 150–400)
RBC: 2.92 MIL/uL — ABNORMAL LOW (ref 3.87–5.11)
RDW: 17.7 % — ABNORMAL HIGH (ref 11.5–15.5)
WBC: 14.9 10*3/uL — ABNORMAL HIGH (ref 4.0–10.5)
nRBC: 0.7 % — ABNORMAL HIGH (ref 0.0–0.2)

## 2020-10-06 LAB — GLUCOSE, CAPILLARY
Glucose-Capillary: 127 mg/dL — ABNORMAL HIGH (ref 70–99)
Glucose-Capillary: 153 mg/dL — ABNORMAL HIGH (ref 70–99)
Glucose-Capillary: 145 mg/dL — ABNORMAL HIGH (ref 70–99)
Glucose-Capillary: 173 mg/dL — ABNORMAL HIGH (ref 70–99)
Glucose-Capillary: 141 mg/dL — ABNORMAL HIGH (ref 70–99)
Glucose-Capillary: 116 mg/dL — ABNORMAL HIGH (ref 70–99)

## 2020-10-06 LAB — BASIC METABOLIC PANEL
Anion gap: 11 (ref 5–15)
BUN: 91 mg/dL — ABNORMAL HIGH (ref 6–20)
CO2: 24 mmol/L (ref 22–32)
Calcium: 7.5 mg/dL — ABNORMAL LOW (ref 8.9–10.3)
Chloride: 106 mmol/L (ref 98–111)
Creatinine, Ser: 1.85 mg/dL — ABNORMAL HIGH (ref 0.44–1.00)
GFR calc non Af Amer: 31 mL/min — ABNORMAL LOW (ref >60)
Glucose, Bld: 124 mg/dL — ABNORMAL HIGH (ref 70–99)
Potassium: 3.5 mmol/L (ref 3.5–5.1)
Sodium: 141 mmol/L (ref 135–145)

## 2020-10-06 LAB — MAGNESIUM: Magnesium: 1.9 mg/dL (ref 1.7–2.4)

## 2020-10-06 LAB — HEMOGLOBIN A1C
Hgb A1c MFr Bld: 5.4 % (ref 4.8–5.6)
Mean Plasma Glucose: 108.28 mg/dL

## 2020-10-06 LAB — PHOSPHORUS: Phosphorus: 4.1 mg/dL (ref 2.5–4.6)

## 2020-10-06 MED ORDER — FUROSEMIDE 10 MG/ML IJ SOLN: 80.0000 mg | Freq: Once | INTRAMUSCULAR | Status: DC

## 2020-10-06 MED ORDER — POTASSIUM CHLORIDE 20 MEQ/15ML (10%) PO SOLN
40.0000 meq | Freq: Once | ORAL | Status: DC
  Filled 2020-10-06: qty 30

## 2020-10-06 MED ORDER — ERGOCALCIFEROL 200 MCG/ML PO SOLN
4000.0000 [IU] | Freq: Every day | ORAL | Status: DC
  Administered 2020-10-06 – 2020-10-31 (×26): 4000 [IU]
  Filled 2020-10-06 (×28): qty 0.5

## 2020-10-06 MED ORDER — FUROSEMIDE 10 MG/ML IJ SOLN
100.0000 mg | Freq: Once | INTRAVENOUS | Status: AC
  Administered 2020-10-06: 100 mg via INTRAVENOUS
  Filled 2020-10-06: qty 10

## 2020-10-06 MED ORDER — MAGNESIUM SULFATE IN D5W 1-5 GM/100ML-% IV SOLN
1.0000 g | Freq: Once | INTRAVENOUS | Status: AC
  Administered 2020-10-06: 1 g via INTRAVENOUS
  Filled 2020-10-06: qty 100

## 2020-10-06 MED ORDER — FUROSEMIDE 10 MG/ML IJ SOLN
60.0000 mg | Freq: Once | INTRAMUSCULAR | Status: AC
  Administered 2020-10-06: 60 mg via INTRAVENOUS
  Filled 2020-10-06: qty 6

## 2020-10-06 MED ORDER — ERGOCALCIFEROL 200 MCG/ML PO SOLN
4000.0000 [IU] | Freq: Every day | ORAL | Status: DC
  Filled 2020-10-06: qty 0.5

## 2020-10-06 MED ORDER — FUROSEMIDE 10 MG/ML IJ SOLN
120.0000 mg | Freq: Once | INTRAVENOUS | Status: AC
  Administered 2020-10-06: 120 mg via INTRAVENOUS
  Filled 2020-10-06 (×2): qty 12

## 2020-10-06 MED ORDER — ENOXAPARIN SODIUM 40 MG/0.4ML ~~LOC~~ SOLN
40.0000 mg | Freq: Two times a day (BID) | SUBCUTANEOUS | Status: DC
  Administered 2020-10-06 – 2020-10-10 (×9): 40 mg via SUBCUTANEOUS
  Filled 2020-10-06 (×9): qty 0.4

## 2020-10-06 MED ORDER — POTASSIUM CHLORIDE 20 MEQ/15ML (10%) PO SOLN
40.0000 meq | Freq: Once | ORAL | Status: AC
  Administered 2020-10-06: 40 meq

## 2020-10-06 MED ORDER — INSULIN ASPART 100 UNIT/ML ~~LOC~~ SOLN
0.0000 [IU] | SUBCUTANEOUS | Status: DC
  Administered 2020-10-06: 3 [IU] via SUBCUTANEOUS
  Administered 2020-10-06: 4 [IU] via SUBCUTANEOUS
  Administered 2020-10-07 – 2020-10-09 (×17): 3 [IU] via SUBCUTANEOUS
  Administered 2020-10-10: 4 [IU] via SUBCUTANEOUS
  Administered 2020-10-10 (×3): 3 [IU] via SUBCUTANEOUS
  Administered 2020-10-10: 4 [IU] via SUBCUTANEOUS
  Administered 2020-10-11: 3 [IU] via SUBCUTANEOUS
  Administered 2020-10-11: 4 [IU] via SUBCUTANEOUS
  Administered 2020-10-11 (×4): 3 [IU] via SUBCUTANEOUS
  Administered 2020-10-12 (×3): 4 [IU] via SUBCUTANEOUS
  Administered 2020-10-12: 3 [IU] via SUBCUTANEOUS
  Administered 2020-10-12 (×2): 4 [IU] via SUBCUTANEOUS
  Administered 2020-10-13 (×3): 3 [IU] via SUBCUTANEOUS
  Administered 2020-10-14: 4 [IU] via SUBCUTANEOUS
  Administered 2020-10-14 (×2): 3 [IU] via SUBCUTANEOUS
  Administered 2020-10-14: 4 [IU] via SUBCUTANEOUS
  Administered 2020-10-14 – 2020-10-15 (×3): 3 [IU] via SUBCUTANEOUS
  Administered 2020-10-15: 4 [IU] via SUBCUTANEOUS
  Administered 2020-10-15: 3 [IU] via SUBCUTANEOUS
  Administered 2020-10-15 (×2): 4 [IU] via SUBCUTANEOUS
  Administered 2020-10-16 (×4): 3 [IU] via SUBCUTANEOUS
  Administered 2020-10-16: 4 [IU] via SUBCUTANEOUS
  Administered 2020-10-16 – 2020-10-18 (×8): 3 [IU] via SUBCUTANEOUS
  Administered 2020-10-18: 4 [IU] via SUBCUTANEOUS
  Administered 2020-10-18 – 2020-10-19 (×3): 3 [IU] via SUBCUTANEOUS
  Administered 2020-10-19: 4 [IU] via SUBCUTANEOUS
  Administered 2020-10-19 – 2020-10-20 (×4): 3 [IU] via SUBCUTANEOUS
  Administered 2020-10-20 (×2): 4 [IU] via SUBCUTANEOUS
  Administered 2020-10-21 – 2020-10-22 (×6): 3 [IU] via SUBCUTANEOUS
  Administered 2020-10-22: 4 [IU] via SUBCUTANEOUS
  Administered 2020-10-23 – 2020-11-04 (×26): 3 [IU] via SUBCUTANEOUS
  Administered 2020-11-05 – 2020-11-07 (×2): 4 [IU] via SUBCUTANEOUS
  Administered 2020-11-08: 3 [IU] via SUBCUTANEOUS
  Administered 2020-11-08 (×3): 4 [IU] via SUBCUTANEOUS
  Administered 2020-11-09 (×3): 3 [IU] via SUBCUTANEOUS
  Administered 2020-11-10: 4 [IU] via SUBCUTANEOUS

## 2020-10-06 NOTE — Progress Notes (Signed)
Orthopedic Tech Progress Note Patient Details:  Sara [name] [birth year] [id number]  Ortho Devices Type of Ortho Device: Prafo boot/shoe Ortho Device/Splint Location: LLE Ortho Device/Splint Interventions: Ordered, Application, Adjustment   Post Interventions Patient Tolerated: Well Instructions Provided: Care of device, Adjustment of device, Poper ambulation with device   Sara [name] 10/06/2020, 12:38 PM
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
## 2020-10-06 NOTE — Plan of Care (Signed)
  Problem: Clinical Measurements: Goal: Cardiovascular complication will be avoided Outcome: Progressing   Problem: Nutrition: Goal: Adequate nutrition will be maintained Outcome: Progressing   Problem: Pain Managment: Goal: General experience of comfort will improve Outcome: Progressing   Problem: Safety: Goal: Ability to remain free from injury will improve Outcome: Progressing   Problem: Skin Integrity: Goal: Risk for impaired skin integrity will decrease Outcome: Progressing   Problem: Pain Management: Goal: Pain level will decrease Outcome: Progressing   Problem: Activity: Goal: Risk for activity intolerance will decrease Outcome: Not Progressing
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
## 2020-10-07 ENCOUNTER — Inpatient Hospital Stay (HOSPITAL_COMMUNITY): Payer: Medicaid Other

## 2020-10-07 DIAGNOSIS — N179 Acute kidney failure, unspecified: Secondary | ICD-10-CM | POA: Diagnosis not present

## 2020-10-07 DIAGNOSIS — J9600 Acute respiratory failure, unspecified whether with hypoxia or hypercapnia: Secondary | ICD-10-CM | POA: Diagnosis not present

## 2020-10-07 DIAGNOSIS — D62 Acute posthemorrhagic anemia: Secondary | ICD-10-CM | POA: Diagnosis not present

## 2020-10-07 DIAGNOSIS — S2243XA Multiple fractures of ribs, bilateral, initial encounter for closed fracture: Secondary | ICD-10-CM | POA: Diagnosis not present

## 2020-10-07 LAB — GLUCOSE, CAPILLARY
Glucose-Capillary: 124 mg/dL — ABNORMAL HIGH (ref 70–99)
Glucose-Capillary: 139 mg/dL — ABNORMAL HIGH (ref 70–99)
Glucose-Capillary: 139 mg/dL — ABNORMAL HIGH (ref 70–99)
Glucose-Capillary: 140 mg/dL — ABNORMAL HIGH (ref 70–99)
Glucose-Capillary: 143 mg/dL — ABNORMAL HIGH (ref 70–99)
Glucose-Capillary: 148 mg/dL — ABNORMAL HIGH (ref 70–99)

## 2020-10-07 LAB — BASIC METABOLIC PANEL
Anion gap: 11 (ref 5–15)
BUN: 104 mg/dL — ABNORMAL HIGH (ref 6–20)
CO2: 27 mmol/L (ref 22–32)
Calcium: 7.6 mg/dL — ABNORMAL LOW (ref 8.9–10.3)
Chloride: 104 mmol/L (ref 98–111)
Creatinine, Ser: 2 mg/dL — ABNORMAL HIGH (ref 0.44–1.00)
GFR, Estimated: 28 mL/min — ABNORMAL LOW (ref >60)
Glucose, Bld: 153 mg/dL — ABNORMAL HIGH (ref 70–99)
Potassium: 3.5 mmol/L (ref 3.5–5.1)
Sodium: 142 mmol/L (ref 135–145)

## 2020-10-07 LAB — CBC
HCT: 27.3 % — ABNORMAL LOW (ref 36.0–46.0)
Hemoglobin: 8.6 g/dL — ABNORMAL LOW (ref 12.0–15.0)
MCH: 30.6 pg (ref 26.0–34.0)
MCHC: 31.5 g/dL (ref 30.0–36.0)
MCV: 97.2 fL (ref 80.0–100.0)
Platelets: 566 10*3/uL — ABNORMAL HIGH (ref 150–400)
RBC: 2.81 MIL/uL — ABNORMAL LOW (ref 3.87–5.11)
RDW: 18 % — ABNORMAL HIGH (ref 11.5–15.5)
WBC: 14.2 10*3/uL — ABNORMAL HIGH (ref 4.0–10.5)
nRBC: 0.4 % — ABNORMAL HIGH (ref 0.0–0.2)

## 2020-10-07 LAB — MAGNESIUM: Magnesium: 2.1 mg/dL (ref 1.7–2.4)

## 2020-10-07 LAB — PHOSPHORUS: Phosphorus: 3.9 mg/dL (ref 2.5–4.6)

## 2020-10-07 NOTE — Plan of Care (Signed)
°  Problem: Nutrition: Goal: Adequate nutrition will be maintained Outcome: Progressing   Problem: Elimination: Goal: Will not experience complications related to bowel motility Outcome: Progressing   Problem: Pain Managment: Goal: General experience of comfort will improve Outcome: Progressing   Problem: Clinical Measurements: Goal: Cardiovascular complication will be avoided Outcome: Not Progressing   Problem: Activity: Goal: Risk for activity intolerance will decrease Outcome: Not Progressing
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
## 2020-10-08 DIAGNOSIS — S2243XA Multiple fractures of ribs, bilateral, initial encounter for closed fracture: Secondary | ICD-10-CM | POA: Diagnosis not present

## 2020-10-08 DIAGNOSIS — N179 Acute kidney failure, unspecified: Secondary | ICD-10-CM | POA: Diagnosis not present

## 2020-10-08 DIAGNOSIS — J9600 Acute respiratory failure, unspecified whether with hypoxia or hypercapnia: Secondary | ICD-10-CM | POA: Diagnosis not present

## 2020-10-08 DIAGNOSIS — D62 Acute posthemorrhagic anemia: Secondary | ICD-10-CM | POA: Diagnosis not present

## 2020-10-08 LAB — GLUCOSE, CAPILLARY
Glucose-Capillary: 123 mg/dL — ABNORMAL HIGH (ref 70–99)
Glucose-Capillary: 125 mg/dL — ABNORMAL HIGH (ref 70–99)
Glucose-Capillary: 129 mg/dL — ABNORMAL HIGH (ref 70–99)
Glucose-Capillary: 131 mg/dL — ABNORMAL HIGH (ref 70–99)
Glucose-Capillary: 136 mg/dL — ABNORMAL HIGH (ref 70–99)
Glucose-Capillary: 145 mg/dL — ABNORMAL HIGH (ref 70–99)

## 2020-10-08 LAB — BASIC METABOLIC PANEL
Anion gap: 12 (ref 5–15)
Anion gap: 11 (ref 5–15)
BUN: 117 mg/dL — ABNORMAL HIGH (ref 6–20)
BUN: 123 mg/dL — ABNORMAL HIGH (ref 6–20)
CO2: 29 mmol/L (ref 22–32)
CO2: 30 mmol/L (ref 22–32)
Calcium: 7.7 mg/dL — ABNORMAL LOW (ref 8.9–10.3)
Calcium: 7.8 mg/dL — ABNORMAL LOW (ref 8.9–10.3)
Chloride: 107 mmol/L (ref 98–111)
Chloride: 108 mmol/L (ref 98–111)
Creatinine, Ser: 1.96 mg/dL — ABNORMAL HIGH (ref 0.44–1.00)
Creatinine, Ser: 2.05 mg/dL — ABNORMAL HIGH (ref 0.44–1.00)
GFR, Estimated: 29 mL/min — ABNORMAL LOW (ref >60)
GFR, Estimated: 27 mL/min — ABNORMAL LOW (ref >60)
Glucose, Bld: 146 mg/dL — ABNORMAL HIGH (ref 70–99)
Glucose, Bld: 122 mg/dL — ABNORMAL HIGH (ref 70–99)
Potassium: 3.5 mmol/L (ref 3.5–5.1)
Potassium: 3.9 mmol/L (ref 3.5–5.1)
Sodium: 148 mmol/L — ABNORMAL HIGH (ref 135–145)
Sodium: 149 mmol/L — ABNORMAL HIGH (ref 135–145)

## 2020-10-08 LAB — CBC
HCT: 26.9 % — ABNORMAL LOW (ref 36.0–46.0)
Hemoglobin: 8.1 g/dL — ABNORMAL LOW (ref 12.0–15.0)
MCH: 30 pg (ref 26.0–34.0)
MCHC: 30.1 g/dL (ref 30.0–36.0)
MCV: 99.6 fL (ref 80.0–100.0)
Platelets: 571 10*3/uL — ABNORMAL HIGH (ref 150–400)
RBC: 2.7 MIL/uL — ABNORMAL LOW (ref 3.87–5.11)
RDW: 17.6 % — ABNORMAL HIGH (ref 11.5–15.5)
WBC: 14.2 10*3/uL — ABNORMAL HIGH (ref 4.0–10.5)
nRBC: 0.3 % — ABNORMAL HIGH (ref 0.0–0.2)

## 2020-10-08 LAB — PHOSPHORUS: Phosphorus: 3.3 mg/dL (ref 2.5–4.6)

## 2020-10-08 LAB — MAGNESIUM: Magnesium: 2.1 mg/dL (ref 1.7–2.4)

## 2020-10-08 MED ORDER — DAKINS (1/4 STRENGTH) 0.125 % EX SOLN
Freq: Two times a day (BID) | CUTANEOUS | Status: DC
  Administered 2020-10-11 – 2020-10-14 (×5): 1
  Filled 2020-10-08 (×5): qty 473

## 2020-10-08 MED ORDER — FUROSEMIDE 10 MG/ML IJ SOLN: 80.0000 mg | Freq: Once | INTRAMUSCULAR | Status: DC

## 2020-10-08 MED ORDER — METOLAZONE 5 MG PO TABS
10.0000 mg | ORAL_TABLET | Freq: Once | ORAL | Status: AC
  Administered 2020-10-08: 10 mg
  Filled 2020-10-08: qty 2
  Filled 2020-10-08: qty 1

## 2020-10-08 MED ORDER — METOLAZONE 10 MG PO TABS
10.0000 mg | ORAL_TABLET | Freq: Once | ORAL | Status: DC
  Filled 2020-10-08: qty 1

## 2020-10-08 MED ORDER — FUROSEMIDE 10 MG/ML IJ SOLN
80.0000 mg | Freq: Once | INTRAMUSCULAR | Status: AC
  Administered 2020-10-08: 80 mg via INTRAVENOUS
  Filled 2020-10-08: qty 8

## 2020-10-08 MED ORDER — SODIUM CHLORIDE 0.9 % IV SOLN
2.0000 g | Freq: Two times a day (BID) | INTRAVENOUS | Status: DC
  Administered 2020-10-08 – 2020-10-09 (×2): 2 g via INTRAVENOUS
  Filled 2020-10-08 (×2): qty 2

## 2020-10-08 MED ORDER — OXYCODONE HCL 5 MG/5ML PO SOLN
5.0000 mg | ORAL | Status: DC | PRN
  Administered 2020-10-08 – 2020-10-10 (×7): 10 mg
  Administered 2020-10-10: 5 mg
  Administered 2020-10-11 – 2020-10-18 (×7): 10 mg
  Administered 2020-10-18: 5 mg
  Administered 2020-10-20 – 2020-10-23 (×5): 10 mg
  Administered 2020-10-24: 5 mg
  Administered 2020-10-24: 10 mg
  Administered 2020-10-26 – 2020-10-28 (×2): 5 mg
  Filled 2020-10-08 (×12): qty 10
  Filled 2020-10-08: qty 5
  Filled 2020-10-08 (×2): qty 10
  Filled 2020-10-08: qty 5
  Filled 2020-10-08 (×2): qty 10
  Filled 2020-10-08: qty 5
  Filled 2020-10-08 (×3): qty 10
  Filled 2020-10-08: qty 5
  Filled 2020-10-08 (×2): qty 10

## 2020-10-08 MED ORDER — PANTOPRAZOLE SODIUM 40 MG PO PACK
40.0000 mg | PACK | Freq: Every day | ORAL | Status: DC
  Administered 2020-10-09 – 2020-10-31 (×22): 40 mg
  Filled 2020-10-08 (×23): qty 20

## 2020-10-08 MED ORDER — POTASSIUM CHLORIDE 20 MEQ/15ML (10%) PO SOLN
40.0000 meq | Freq: Once | ORAL | Status: AC
  Administered 2020-10-08: 40 meq
  Filled 2020-10-08: qty 30

## 2020-10-08 MED ORDER — IPRATROPIUM-ALBUTEROL 0.5-2.5 (3) MG/3ML IN SOLN
3.0000 mL | Freq: Three times a day (TID) | RESPIRATORY_TRACT | Status: DC
  Administered 2020-10-08 – 2020-10-19 (×33): 3 mL via RESPIRATORY_TRACT
  Filled 2020-10-08 (×33): qty 3
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
## 2020-10-09 ENCOUNTER — Encounter (HOSPITAL_COMMUNITY): Payer: Self-pay | Admitting: Certified Registered Nurse Anesthetist

## 2020-10-09 ENCOUNTER — Inpatient Hospital Stay (HOSPITAL_COMMUNITY): Payer: Medicaid Other

## 2020-10-09 DIAGNOSIS — D62 Acute posthemorrhagic anemia: Secondary | ICD-10-CM | POA: Diagnosis not present

## 2020-10-09 DIAGNOSIS — S2243XA Multiple fractures of ribs, bilateral, initial encounter for closed fracture: Secondary | ICD-10-CM | POA: Diagnosis not present

## 2020-10-09 DIAGNOSIS — J9600 Acute respiratory failure, unspecified whether with hypoxia or hypercapnia: Secondary | ICD-10-CM | POA: Diagnosis not present

## 2020-10-09 DIAGNOSIS — N179 Acute kidney failure, unspecified: Secondary | ICD-10-CM | POA: Diagnosis not present

## 2020-10-09 LAB — GLUCOSE, CAPILLARY
Glucose-Capillary: 125 mg/dL — ABNORMAL HIGH (ref 70–99)
Glucose-Capillary: 131 mg/dL — ABNORMAL HIGH (ref 70–99)
Glucose-Capillary: 134 mg/dL — ABNORMAL HIGH (ref 70–99)
Glucose-Capillary: 136 mg/dL — ABNORMAL HIGH (ref 70–99)
Glucose-Capillary: 138 mg/dL — ABNORMAL HIGH (ref 70–99)
Glucose-Capillary: 142 mg/dL — ABNORMAL HIGH (ref 70–99)

## 2020-10-09 LAB — CULTURE, RESPIRATORY W GRAM STAIN

## 2020-10-09 MED ORDER — METOLAZONE 10 MG PO TABS: 10.0000 mg | ORAL_TABLET | Freq: Once | ORAL | Status: DC

## 2020-10-09 MED ORDER — PIPERACILLIN-TAZOBACTAM 3.375 G IVPB
3.3750 g | Freq: Three times a day (TID) | INTRAVENOUS | Status: AC
  Administered 2020-10-09 – 2020-10-15 (×20): 3.375 g via INTRAVENOUS
  Filled 2020-10-09 (×20): qty 50

## 2020-10-09 MED ORDER — METOLAZONE 5 MG PO TABS
10.0000 mg | ORAL_TABLET | Freq: Once | ORAL | Status: AC
  Administered 2020-10-09: 10 mg
  Filled 2020-10-09: qty 2
  Filled 2020-10-09: qty 1

## 2020-10-09 NOTE — Consult Note (Addendum)
WOC Nurse ostomy follow up: DOS 09/24/20 (Dr. 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 Stoma measures 1 and 5/8 inch oval Peristomal assessment: skin is intact in the immediate peristomal plane Treatment options for stomal/peristomal skin: skin barrier ring, flat pouching system, 2 and 3/4 inch Output: brown liquid stool  Ostomy pouching: 2pc. 2 and 3/4 inch pouching system with skin barrier ring Education provided: Niece present and asking appropriate questions. Pouch change procedure is demonstrated with provision of education: Rationale for using tap water to clean peristomal skin (vs baby wipes), demonstration of sizing and importance relayed, use and purpose of skin barrier ring, placement of skin barrier and attachment of pouch, Lock and Roll closure mechanism, formation of condensation on pouch to indicate seal. Explanation of why toilet paper is used to clean bottom 2 inches of tail closure provided. Pouch change frequency taught (2-3 times per week) and emptying frequency is taught ( when 1/3 to 1/2 full). Niece relays that her father had a temporary stoma many years ago and inquires why it might have been in a different location.  Explanation provided to her expressed satisfaction. Niece takes a photograph of ostomy with pouch off and explains that "one day this and other photos will be needed" as "a lawsuit is being filed". Enrolled patient in [place] [employer] Discharge program: No  Supplies in room:  2 skin barriers, 2 skin barrier rings, 2 pouches, pattern, sizing guide.   WOC nursing team will follow, seeing every 7-10 days and will remain available to this patient, the nursing, surgical and medical teams.  Please re-consult if needed between visits. Thanks, Ladona Mow, MSN, RN, GNP, [place]  Pager# [phone]
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
## 2020-10-09 NOTE — Consult Note (Signed)
WOC Nurse Consult Note: Patient receiving care in [phone].  I spoke with the patient's primary RN, Dahlia Client, via telephone. Reason for Consult: "midline wound" order placed 10/10 at 1438 by Dr. Bedelia Person Wound type: surgical At the same time an order was entered by Dr. Bedelia Person for Dakin's solution moistened gauze, which is within the scope of the bedside RN. No new needs identified. Dakin's solution in room per [facility]. Helmut Muster, RN, MSN, CWOCN, CNS-BC, pager [phone]

## 2020-10-10 ENCOUNTER — Inpatient Hospital Stay (HOSPITAL_COMMUNITY): Payer: Medicaid Other

## 2020-10-10 ENCOUNTER — Encounter (HOSPITAL_COMMUNITY): Admission: EM | Disposition: A | Discharge status: Rehabilitation Facility | Payer: Self-pay | Source: Home / Self Care

## 2020-10-10 DIAGNOSIS — S2243XA Multiple fractures of ribs, bilateral, initial encounter for closed fracture: Secondary | ICD-10-CM | POA: Diagnosis not present

## 2020-10-10 DIAGNOSIS — N179 Acute kidney failure, unspecified: Secondary | ICD-10-CM | POA: Diagnosis not present

## 2020-10-10 DIAGNOSIS — J9600 Acute respiratory failure, unspecified whether with hypoxia or hypercapnia: Secondary | ICD-10-CM | POA: Diagnosis not present

## 2020-10-10 DIAGNOSIS — D62 Acute posthemorrhagic anemia: Secondary | ICD-10-CM | POA: Diagnosis not present

## 2020-10-10 LAB — AEROBIC CULTURE W GRAM STAIN (SUPERFICIAL SPECIMEN): Special Requests: NORMAL

## 2020-10-10 LAB — GLUCOSE, CAPILLARY
Glucose-Capillary: 100 mg/dL — ABNORMAL HIGH (ref 70–99)
Glucose-Capillary: 113 mg/dL — ABNORMAL HIGH (ref 70–99)
Glucose-Capillary: 159 mg/dL — ABNORMAL HIGH (ref 70–99)
Glucose-Capillary: 121 mg/dL — ABNORMAL HIGH (ref 70–99)
Glucose-Capillary: 162 mg/dL — ABNORMAL HIGH (ref 70–99)
Glucose-Capillary: 143 mg/dL — ABNORMAL HIGH (ref 70–99)

## 2020-10-10 LAB — CBC
HCT: 28.4 % — ABNORMAL LOW (ref 36.0–46.0)
Hemoglobin: 8.2 g/dL — ABNORMAL LOW (ref 12.0–15.0)
MCH: 29.5 pg (ref 26.0–34.0)
MCHC: 28.9 g/dL — ABNORMAL LOW (ref 30.0–36.0)
MCV: 102.2 fL — ABNORMAL HIGH (ref 80.0–100.0)
Platelets: 559 10*3/uL — ABNORMAL HIGH (ref 150–400)
RBC: 2.78 MIL/uL — ABNORMAL LOW (ref 3.87–5.11)
RDW: 17 % — ABNORMAL HIGH (ref 11.5–15.5)
WBC: 16.8 10*3/uL — ABNORMAL HIGH (ref 4.0–10.5)
nRBC: 0.1 % (ref 0.0–0.2)

## 2020-10-10 LAB — BASIC METABOLIC PANEL
Anion gap: 15 (ref 5–15)
BUN: 133 mg/dL — ABNORMAL HIGH (ref 6–20)
CO2: 31 mmol/L (ref 22–32)
Calcium: 7.7 mg/dL — ABNORMAL LOW (ref 8.9–10.3)
Chloride: 107 mmol/L (ref 98–111)
Creatinine, Ser: 2.23 mg/dL — ABNORMAL HIGH (ref 0.44–1.00)
GFR, Estimated: 25 mL/min — ABNORMAL LOW (ref >60)
Glucose, Bld: 116 mg/dL — ABNORMAL HIGH (ref 70–99)
Potassium: 3.5 mmol/L (ref 3.5–5.1)
Sodium: 153 mmol/L — ABNORMAL HIGH (ref 135–145)

## 2020-10-10 SURGERY — CREATION, TRACHEOSTOMY
Anesthesia: General

## 2020-10-10 MED ORDER — METOLAZONE 5 MG PO TABS
5.0000 mg | ORAL_TABLET | Freq: Once | ORAL | Status: AC
  Administered 2020-10-10: 5 mg
  Filled 2020-10-10: qty 1

## 2020-10-10 MED ORDER — FREE WATER
200.0000 mL | Freq: Three times a day (TID) | Status: DC
  Administered 2020-10-10 – 2020-10-15 (×15): 200 mL

## 2020-10-10 MED ORDER — DOXYCYCLINE CALCIUM 50 MG/5ML PO SYRP
100.0000 mg | ORAL_SOLUTION | Freq: Two times a day (BID) | ORAL | Status: AC
  Administered 2020-10-10 – 2020-10-16 (×14): 100 mg
  Filled 2020-10-10 (×14): qty 10

## 2020-10-10 NOTE — Progress Notes (Signed)
SLP Cancellation Note  Patient Details Name: Sara [name] MRN: [id number] DOB: 10-Oct-1966   Cancelled treatment:       Reason Eval/Treat Not Completed: Other (comment) Patient with plans for new tracheostomy today. Orders for SLP eval and treat for PMSV received. Will follow pt closely for readiness for SLP interventions as appropriate.    [name], [name] 10/10/2020, 7:46 AM
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
## 2020-10-10 NOTE — Progress Notes (Signed)
Inpatient Rehab Admissions Coordinator Note:   Per therapy recommendations, pt was screened for CIR candidacy by Megan Salon, MS CCC-SLP. At this time, Pt.is tolerating only bed-level exercises and it is not clear that she could tolerate CIR level therapies.I will follow for progress and place consult order once Pt. Appears appropriate for CIR.  Please contact me with questions.   Megan Salon, MS, CCC-SLP Rehab Admissions Coordinator  [phone] (celll) [phone] (office)

## 2020-10-10 NOTE — Progress Notes (Signed)
Patient ID: Sara [name], female   DOB: 08/18/1966, 54 y.o.   MRN: [id number] I met with her daughter at the bedside and updated her. I also discussed proceeding with tracheostomy once Sara [name]'s respiratory status improves further. I discussed the procedure,  risks and benefits. She is thinking about it and may sign the consent later today.  Burke Thompson, MD, MPH, FACS Please use [email or web address] to contact on call provider  
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
## 2020-10-11 ENCOUNTER — Inpatient Hospital Stay (HOSPITAL_COMMUNITY): Payer: Medicaid Other

## 2020-10-11 DIAGNOSIS — R609 Edema, unspecified: Secondary | ICD-10-CM

## 2020-10-11 DIAGNOSIS — R52 Pain, unspecified: Secondary | ICD-10-CM

## 2020-10-11 DIAGNOSIS — M7989 Other specified soft tissue disorders: Secondary | ICD-10-CM | POA: Diagnosis not present

## 2020-10-11 DIAGNOSIS — E870 Hyperosmolality and hypernatremia: Secondary | ICD-10-CM | POA: Diagnosis not present

## 2020-10-11 DIAGNOSIS — D62 Acute posthemorrhagic anemia: Secondary | ICD-10-CM | POA: Diagnosis not present

## 2020-10-11 DIAGNOSIS — S2243XA Multiple fractures of ribs, bilateral, initial encounter for closed fracture: Secondary | ICD-10-CM | POA: Diagnosis not present

## 2020-10-11 DIAGNOSIS — N179 Acute kidney failure, unspecified: Secondary | ICD-10-CM | POA: Diagnosis not present

## 2020-10-11 DIAGNOSIS — E878 Other disorders of electrolyte and fluid balance, not elsewhere classified: Secondary | ICD-10-CM | POA: Diagnosis not present

## 2020-10-11 DIAGNOSIS — J9600 Acute respiratory failure, unspecified whether with hypoxia or hypercapnia: Secondary | ICD-10-CM | POA: Diagnosis not present

## 2020-10-11 LAB — CBC
HCT: 28.7 % — ABNORMAL LOW (ref 36.0–46.0)
Hemoglobin: 8.3 g/dL — ABNORMAL LOW (ref 12.0–15.0)
MCH: 29.3 pg (ref 26.0–34.0)
MCHC: 28.9 g/dL — ABNORMAL LOW (ref 30.0–36.0)
MCV: 101.4 fL — ABNORMAL HIGH (ref 80.0–100.0)
Platelets: 564 10*3/uL — ABNORMAL HIGH (ref 150–400)
RBC: 2.83 MIL/uL — ABNORMAL LOW (ref 3.87–5.11)
RDW: 16.8 % — ABNORMAL HIGH (ref 11.5–15.5)
WBC: 17.1 10*3/uL — ABNORMAL HIGH (ref 4.0–10.5)
nRBC: 0.2 % (ref 0.0–0.2)

## 2020-10-11 LAB — BASIC METABOLIC PANEL
Anion gap: 13 (ref 5–15)
BUN: 137 mg/dL — ABNORMAL HIGH (ref 6–20)
CO2: 31 mmol/L (ref 22–32)
Calcium: 7.8 mg/dL — ABNORMAL LOW (ref 8.9–10.3)
Chloride: 110 mmol/L (ref 98–111)
Creatinine, Ser: 2.43 mg/dL — ABNORMAL HIGH (ref 0.44–1.00)
GFR, Estimated: 22 mL/min — ABNORMAL LOW (ref >60)
Glucose, Bld: 159 mg/dL — ABNORMAL HIGH (ref 70–99)
Potassium: 3.2 mmol/L — ABNORMAL LOW (ref 3.5–5.1)
Sodium: 154 mmol/L — ABNORMAL HIGH (ref 135–145)

## 2020-10-11 LAB — GLUCOSE, CAPILLARY
Glucose-Capillary: 152 mg/dL — ABNORMAL HIGH (ref 70–99)
Glucose-Capillary: 147 mg/dL — ABNORMAL HIGH (ref 70–99)
Glucose-Capillary: 148 mg/dL — ABNORMAL HIGH (ref 70–99)
Glucose-Capillary: 124 mg/dL — ABNORMAL HIGH (ref 70–99)
Glucose-Capillary: 142 mg/dL — ABNORMAL HIGH (ref 70–99)
Glucose-Capillary: 143 mg/dL — ABNORMAL HIGH (ref 70–99)

## 2020-10-11 LAB — SODIUM, URINE, RANDOM: Sodium, Ur: 64 mmol/L

## 2020-10-11 LAB — MAGNESIUM: Magnesium: 2.5 mg/dL — ABNORMAL HIGH (ref 1.7–2.4)

## 2020-10-11 LAB — PHOSPHORUS: Phosphorus: 3.5 mg/dL (ref 2.5–4.6)

## 2020-10-11 MED ORDER — METOLAZONE 5 MG PO TABS
10.0000 mg | ORAL_TABLET | Freq: Once | ORAL | Status: AC
  Administered 2020-10-11: 10 mg
  Filled 2020-10-11: qty 1
  Filled 2020-10-11: qty 2

## 2020-10-11 MED ORDER — POTASSIUM CHLORIDE 20 MEQ/15ML (10%) PO SOLN
40.0000 meq | ORAL | Status: AC
  Administered 2020-10-11 (×2): 40 meq
  Filled 2020-10-11 (×2): qty 30

## 2020-10-11 MED ORDER — METOLAZONE 5 MG PO TABS
10.0000 mg | ORAL_TABLET | Freq: Once | ORAL | Status: AC
  Administered 2020-10-11: 10 mg
  Filled 2020-10-11: qty 2
  Filled 2020-10-11: qty 1

## 2020-10-11 MED ORDER — HEPARIN SODIUM (PORCINE) 5000 UNIT/ML IJ SOLN
5000.0000 [IU] | Freq: Three times a day (TID) | INTRAMUSCULAR | Status: DC
  Administered 2020-10-11 – 2020-10-23 (×36): 5000 [IU] via SUBCUTANEOUS
  Filled 2020-10-11 (×36): qty 1
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
## 2020-10-11 NOTE — Progress Notes (Signed)
Bilateral Upper Ext. study completed.   See CVProc for preliminary results.   Jannet Askew, RDMS, RVT

## 2020-10-11 NOTE — Progress Notes (Signed)
Abdominal dressing oozing blood and blood clots. Dr. Bedelia Person was on the unit, so she came to patient bedside to assess. Dr. Bedelia Person redressed abdominal incision with Quick Clot and gauze. Will continue to monitor closely.  MD agreed to hold 1400 dose of heparin.

## 2020-10-11 NOTE — Progress Notes (Signed)
Lower Ext study completed.  ° °See CVProc for preliminary results.  ° °Norelle Runnion, RDMS, RVT ° °
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
## 2020-10-11 NOTE — Plan of Care (Signed)
  Problem: Clinical Measurements: Goal: Will remain free from infection Outcome: Progressing   Problem: Nutrition: Goal: Adequate nutrition will be maintained Outcome: Progressing   Problem: Coping: Goal: Level of anxiety will decrease Outcome: Progressing   Problem: Pain Managment: Goal: General experience of comfort will improve Outcome: Progressing   Problem: Safety: Goal: Ability to remain free from injury will improve Outcome: Progressing   Problem: Skin Integrity: Goal: Risk for impaired skin integrity will decrease Outcome: Progressing   Problem: Respiratory: Goal: Ability to maintain a clear airway and adequate ventilation will improve Outcome: Progressing   Problem: Pain Management: Goal: Pain level will decrease Outcome: Progressing   Problem: Activity: Goal: Will remain free from falls Outcome: Progressing   Problem: Urinary Elimination: Goal: Ability to achieve a regular elimination pattern will improve Outcome: Progressing

## 2020-10-11 NOTE — Progress Notes (Signed)
Orthopedic Tech Progress Note Patient Details:  Sara [name] 08-14-1966 [id number]  Ortho Devices Type of Ortho Device: Velcro wrist splint Ortho Device/Splint Location: RUE Ortho Device/Splint Interventions: Ordered, Application, Adjustment   Post Interventions Patient Tolerated: Well Instructions Provided: Care of device, Adjustment of device   Allean Montfort 10/11/2020, 6:55 PM

## 2020-10-12 DIAGNOSIS — D62 Acute posthemorrhagic anemia: Secondary | ICD-10-CM | POA: Diagnosis not present

## 2020-10-12 DIAGNOSIS — J9600 Acute respiratory failure, unspecified whether with hypoxia or hypercapnia: Secondary | ICD-10-CM | POA: Diagnosis not present

## 2020-10-12 DIAGNOSIS — N179 Acute kidney failure, unspecified: Secondary | ICD-10-CM | POA: Diagnosis not present

## 2020-10-12 DIAGNOSIS — S2243XA Multiple fractures of ribs, bilateral, initial encounter for closed fracture: Secondary | ICD-10-CM | POA: Diagnosis not present

## 2020-10-12 LAB — CBC
HCT: 24.9 % — ABNORMAL LOW (ref 36.0–46.0)
HCT: 27 % — ABNORMAL LOW (ref 36.0–46.0)
Hemoglobin: 7 g/dL — ABNORMAL LOW (ref 12.0–15.0)
Hemoglobin: 8.1 g/dL — ABNORMAL LOW (ref 12.0–15.0)
MCH: 29.8 pg (ref 26.0–34.0)
MCH: 30.3 pg (ref 26.0–34.0)
MCHC: 28.1 g/dL — ABNORMAL LOW (ref 30.0–36.0)
MCHC: 30 g/dL (ref 30.0–36.0)
MCV: 106 fL — ABNORMAL HIGH (ref 80.0–100.0)
MCV: 101.1 fL — ABNORMAL HIGH (ref 80.0–100.0)
Platelets: 428 10*3/uL — ABNORMAL HIGH (ref 150–400)
Platelets: 377 10*3/uL (ref 150–400)
RBC: 2.35 MIL/uL — ABNORMAL LOW (ref 3.87–5.11)
RBC: 2.67 MIL/uL — ABNORMAL LOW (ref 3.87–5.11)
RDW: 17 % — ABNORMAL HIGH (ref 11.5–15.5)
RDW: 18 % — ABNORMAL HIGH (ref 11.5–15.5)
WBC: 21 10*3/uL — ABNORMAL HIGH (ref 4.0–10.5)
WBC: 20.9 10*3/uL — ABNORMAL HIGH (ref 4.0–10.5)
nRBC: 0.2 % (ref 0.0–0.2)
nRBC: 0.5 % — ABNORMAL HIGH (ref 0.0–0.2)

## 2020-10-12 LAB — GLUCOSE, CAPILLARY
Glucose-Capillary: 135 mg/dL — ABNORMAL HIGH (ref 70–99)
Glucose-Capillary: 151 mg/dL — ABNORMAL HIGH (ref 70–99)
Glucose-Capillary: 174 mg/dL — ABNORMAL HIGH (ref 70–99)
Glucose-Capillary: 155 mg/dL — ABNORMAL HIGH (ref 70–99)
Glucose-Capillary: 152 mg/dL — ABNORMAL HIGH (ref 70–99)
Glucose-Capillary: 155 mg/dL — ABNORMAL HIGH (ref 70–99)

## 2020-10-12 LAB — PREPARE RBC (CROSSMATCH)

## 2020-10-12 LAB — BASIC METABOLIC PANEL
Anion gap: 14 (ref 5–15)
BUN: 137 mg/dL — ABNORMAL HIGH (ref 6–20)
CO2: 29 mmol/L (ref 22–32)
Calcium: 7.6 mg/dL — ABNORMAL LOW (ref 8.9–10.3)
Chloride: 115 mmol/L — ABNORMAL HIGH (ref 98–111)
Creatinine, Ser: 2.41 mg/dL — ABNORMAL HIGH (ref 0.44–1.00)
GFR, Estimated: 22 mL/min — ABNORMAL LOW (ref >60)
Glucose, Bld: 165 mg/dL — ABNORMAL HIGH (ref 70–99)
Potassium: 3.4 mmol/L — ABNORMAL LOW (ref 3.5–5.1)
Sodium: 158 mmol/L — ABNORMAL HIGH (ref 135–145)

## 2020-10-12 LAB — CREATININE, URINE, RANDOM: Creatinine, Urine: 61.72 mg/dL

## 2020-10-12 LAB — UREA NITROGEN, URINE: Urea Nitrogen, Ur: 840 mg/dL

## 2020-10-12 MED ORDER — SODIUM CHLORIDE 0.45 % IV BOLUS
1000.0000 mL | Freq: Once | INTRAVENOUS | Status: AC
  Administered 2020-10-12: 1000 mL via INTRAVENOUS

## 2020-10-12 MED ORDER — SODIUM CHLORIDE 0.9% IV SOLUTION: Freq: Once | INTRAVENOUS | Status: AC
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
## 2020-10-12 NOTE — Plan of Care (Signed)
  Problem: Clinical Measurements: Goal: Ability to maintain clinical measurements within normal limits will improve Outcome: Progressing Goal: Respiratory complications will improve Outcome: Not Progressing   Problem: Activity: Goal: Risk for activity intolerance will decrease Outcome: Progressing   Problem: Nutrition: Goal: Adequate nutrition will be maintained Outcome: Progressing   Problem: Pain Managment: Goal: General experience of comfort will improve Outcome: Not Progressing   Problem: Activity: Goal: Ability to tolerate increased activity will improve Outcome: Progressing   Problem: Respiratory: Goal: Ability to maintain a clear airway and adequate ventilation will improve Outcome: Not Progressing

## 2020-10-12 NOTE — Progress Notes (Signed)
Temperature prior to blood transfusion 101.1 Axillary. Dr. Janee Morn notified and okay to proceed with transfusion. Patient received prn Tylenol per tube.

## 2020-10-13 ENCOUNTER — Inpatient Hospital Stay (HOSPITAL_COMMUNITY): Payer: Medicaid Other

## 2020-10-13 DIAGNOSIS — S2243XA Multiple fractures of ribs, bilateral, initial encounter for closed fracture: Secondary | ICD-10-CM | POA: Diagnosis not present

## 2020-10-13 DIAGNOSIS — E870 Hyperosmolality and hypernatremia: Secondary | ICD-10-CM | POA: Diagnosis not present

## 2020-10-13 DIAGNOSIS — E878 Other disorders of electrolyte and fluid balance, not elsewhere classified: Secondary | ICD-10-CM | POA: Diagnosis not present

## 2020-10-13 DIAGNOSIS — D62 Acute posthemorrhagic anemia: Secondary | ICD-10-CM | POA: Diagnosis not present

## 2020-10-13 DIAGNOSIS — J9600 Acute respiratory failure, unspecified whether with hypoxia or hypercapnia: Secondary | ICD-10-CM | POA: Diagnosis not present

## 2020-10-13 DIAGNOSIS — N179 Acute kidney failure, unspecified: Secondary | ICD-10-CM | POA: Diagnosis not present

## 2020-10-13 LAB — BASIC METABOLIC PANEL
Anion gap: 15 (ref 5–15)
BUN: 138 mg/dL — ABNORMAL HIGH (ref 6–20)
CO2: 26 mmol/L (ref 22–32)
Calcium: 7.5 mg/dL — ABNORMAL LOW (ref 8.9–10.3)
Chloride: 117 mmol/L — ABNORMAL HIGH (ref 98–111)
Creatinine, Ser: 2.64 mg/dL — ABNORMAL HIGH (ref 0.44–1.00)
GFR, Estimated: 20 mL/min — ABNORMAL LOW (ref >60)
Glucose, Bld: 125 mg/dL — ABNORMAL HIGH (ref 70–99)
Potassium: 3.2 mmol/L — ABNORMAL LOW (ref 3.5–5.1)
Sodium: 158 mmol/L — ABNORMAL HIGH (ref 135–145)

## 2020-10-13 LAB — GLUCOSE, CAPILLARY
Glucose-Capillary: 95 mg/dL (ref 70–99)
Glucose-Capillary: 119 mg/dL — ABNORMAL HIGH (ref 70–99)
Glucose-Capillary: 134 mg/dL — ABNORMAL HIGH (ref 70–99)
Glucose-Capillary: 149 mg/dL — ABNORMAL HIGH (ref 70–99)
Glucose-Capillary: 143 mg/dL — ABNORMAL HIGH (ref 70–99)
Glucose-Capillary: 148 mg/dL — ABNORMAL HIGH (ref 70–99)

## 2020-10-13 LAB — CBC
HCT: 26.9 % — ABNORMAL LOW (ref 36.0–46.0)
Hemoglobin: 8.2 g/dL — ABNORMAL LOW (ref 12.0–15.0)
MCH: 30.6 pg (ref 26.0–34.0)
MCHC: 30.5 g/dL (ref 30.0–36.0)
MCV: 100.4 fL — ABNORMAL HIGH (ref 80.0–100.0)
Platelets: 358 10*3/uL (ref 150–400)
RBC: 2.68 MIL/uL — ABNORMAL LOW (ref 3.87–5.11)
RDW: 17.5 % — ABNORMAL HIGH (ref 11.5–15.5)
WBC: 20.9 10*3/uL — ABNORMAL HIGH (ref 4.0–10.5)
nRBC: 0.4 % — ABNORMAL HIGH (ref 0.0–0.2)

## 2020-10-13 LAB — UREA NITROGEN, URINE: Urea Nitrogen, Ur: 950 mg/dL

## 2020-10-13 MED ORDER — ROCURONIUM BROMIDE 10 MG/ML (PF) SYRINGE: 100.0000 mg | PREFILLED_SYRINGE | Freq: Once | INTRAVENOUS | Status: AC

## 2020-10-13 MED ORDER — MIDAZOLAM HCL 2 MG/2ML IJ SOLN
10.0000 mg | Freq: Once | INTRAMUSCULAR | Status: AC
  Filled 2020-10-13: qty 10

## 2020-10-13 MED ORDER — ROCURONIUM BROMIDE 10 MG/ML (PF) SYRINGE
PREFILLED_SYRINGE | INTRAVENOUS | Status: AC
  Administered 2020-10-13: 100 mg via INTRAVENOUS
  Filled 2020-10-13: qty 10

## 2020-10-13 MED ORDER — METOLAZONE 5 MG PO TABS
10.0000 mg | ORAL_TABLET | Freq: Once | ORAL | Status: DC
  Filled 2020-10-13: qty 2

## 2020-10-13 MED ORDER — POTASSIUM CHLORIDE 20 MEQ/15ML (10%) PO SOLN: 40.0000 meq | ORAL | Status: DC

## 2020-10-13 MED ORDER — FENTANYL CITRATE (PF) 100 MCG/2ML IJ SOLN: 100.0000 ug | Freq: Once | INTRAMUSCULAR | Status: AC

## 2020-10-13 MED ORDER — POTASSIUM CHLORIDE 20 MEQ/15ML (10%) PO SOLN
40.0000 meq | ORAL | Status: AC
  Administered 2020-10-13 (×2): 40 meq
  Filled 2020-10-13 (×2): qty 30

## 2020-10-13 MED ORDER — MIDAZOLAM HCL 2 MG/2ML IJ SOLN
INTRAMUSCULAR | Status: AC
  Administered 2020-10-13: 6 mg via INTRAVENOUS
  Filled 2020-10-13: qty 10

## 2020-10-13 MED ORDER — FENTANYL CITRATE (PF) 100 MCG/2ML IJ SOLN
INTRAMUSCULAR | Status: AC
  Administered 2020-10-13: 50 ug via INTRAVENOUS
  Filled 2020-10-13: qty 2

## 2020-10-13 MED ORDER — ZINC OXIDE 12.8 % EX OINT
TOPICAL_OINTMENT | Freq: Three times a day (TID) | CUTANEOUS | Status: DC
  Administered 2020-10-13 – 2020-11-25 (×13): 1 via TOPICAL
  Filled 2020-10-13 (×2): qty 56.7

## 2020-10-13 MED ORDER — METOLAZONE 5 MG PO TABS
10.0000 mg | ORAL_TABLET | Freq: Once | ORAL | Status: AC
  Administered 2020-10-13: 10 mg
  Filled 2020-10-13: qty 2
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
## 2020-10-13 NOTE — Procedures (Signed)
° °  Operative Note  Date: 10/13/2020  Procedure: percutaneous tracheostomy without bronchoscopic assistance  Pre-op diagnosis: prolonged mechanical ventilation Post-op diagnosis: same  Indication and clinical history: The patient  is a 54 y.o. year old female with prolonged mechanical ventilation  Surgeon: Jesusita Oka, MD Assistant: Rodolph Bong, PA  Anesthesia: 3m versed, 1078mfentanyl  Findings:   Specimen: none  EBL: <5cc   Drains/Implants: #6 Shiley cuffed tracheostomy tube  Disposition: ICU  Description of procedure: The patient was positioned supine with a shoulder roll. Time-out was performed verifying correct patient, procedure, signature of informed consent, and pre-operative antibiotics as indicated. MAC induction was uneventful and the patient was confirmed to be on 100% FiO2. The neck was prepped and draped in the usual sterile fashion. Palpation of neck anatomy was performed. A longitudinal incision was made in the neck and deepened down to the trachea.   The pilot balloon was deflated and the endotracheal tube slowly retracted proximally until the tip of the endotracheal tube was palpated just below the cricoid cartilage. An introducer needle was inserted between the second and third tracheal rings. A guidewire was passed through the introducer needle and the needle removed. Serial dilation was performed and a #6 cuffed Shiley and stylet were inserted over the guidewire and the guidewire and stylet removed. The inner cannula was inserted, the pilot balloon on the tracheostomy inflated, and the ventilator tubing disconnected from the endotracheal tube and connected to the tracheostomy. Chest rise, appropriate end-tidal CO2, and return tidal volumes were confirmed. The tracheostomy tube was sutured in four quadrants and a tracheostomy tie applied to the neck. The endotracheal tube was removed. The patient tolerated the procedure well. There were no complications.  Post-procedure chest x-ray was ordered to confirm tube position and the absence of a pneumothorax.   AyJesusita OkaMD General and TrAlpineurgery

## 2020-10-13 NOTE — Consult Note (Signed)
WOC Nurse Consult Note: Patient receiving care in [facility] 4N25.  Primary RN in to help with wound location, turning, and positioning. Reason for Consult: multiple wounds Wound type: partial thickness areas to labial/groin folds and inner/upper/posteior thighs.  The labial areas are likely related to past edema, moisture, friction.  The thighs are likely related to the former use of immobilizers. These wounds are pink and yellow. There is a horizontal back body fold that has been impacted by MASD-ITD as well. This wound is pink with a small purple area. Pressure Injury POA: Yes/No/NA Measurement: Wound bed: Drainage (amount, consistency, odor) none Periwound: intact Dressing procedure/placement/frequency: tid application of triple paste to thigh and labia areas after washing with soap and water. Also, positioning legs to prevent the thighs from pressing against one another. For the back, each shift washing with soap and water, pat dry, place a narrow strip of Aquacel [place] [phone]) into the fold, secure with paper tape. Patient remains with ETT and not ready for ostomy teaching. Monitor the wound area(s) for worsening of condition such as: Signs/symptoms of infection,  Increase in size,  Development of or worsening of odor, Development of pain, or increased pain at the affected locations.  Notify the medical team if any of these develop.  Thank you for the consult.  Discussed plan of care with the bedside nurse.  WOC nurse will not follow at this time.  Please re-consult the WOC team if needed.  Helmut Muster, RN, MSN, CWOCN, CNS-BC, pager [phone]

## 2020-10-14 DIAGNOSIS — J9600 Acute respiratory failure, unspecified whether with hypoxia or hypercapnia: Secondary | ICD-10-CM | POA: Diagnosis not present

## 2020-10-14 LAB — CBC
HCT: 24.6 % — ABNORMAL LOW (ref 36.0–46.0)
Hemoglobin: 7 g/dL — ABNORMAL LOW (ref 12.0–15.0)
MCH: 29.2 pg (ref 26.0–34.0)
MCHC: 28.5 g/dL — ABNORMAL LOW (ref 30.0–36.0)
MCV: 102.5 fL — ABNORMAL HIGH (ref 80.0–100.0)
Platelets: 302 10*3/uL (ref 150–400)
RBC: 2.4 MIL/uL — ABNORMAL LOW (ref 3.87–5.11)
RDW: 17.2 % — ABNORMAL HIGH (ref 11.5–15.5)
WBC: 18.8 10*3/uL — ABNORMAL HIGH (ref 4.0–10.5)
nRBC: 0.6 % — ABNORMAL HIGH (ref 0.0–0.2)

## 2020-10-14 LAB — GLUCOSE, CAPILLARY
Glucose-Capillary: 159 mg/dL — ABNORMAL HIGH (ref 70–99)
Glucose-Capillary: 138 mg/dL — ABNORMAL HIGH (ref 70–99)
Glucose-Capillary: 133 mg/dL — ABNORMAL HIGH (ref 70–99)
Glucose-Capillary: 155 mg/dL — ABNORMAL HIGH (ref 70–99)
Glucose-Capillary: 136 mg/dL — ABNORMAL HIGH (ref 70–99)
Glucose-Capillary: 149 mg/dL — ABNORMAL HIGH (ref 70–99)
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
## 2020-10-15 ENCOUNTER — Encounter (HOSPITAL_COMMUNITY): Payer: Self-pay

## 2020-10-15 ENCOUNTER — Other Ambulatory Visit: Payer: Self-pay

## 2020-10-15 DIAGNOSIS — E878 Other disorders of electrolyte and fluid balance, not elsewhere classified: Secondary | ICD-10-CM | POA: Diagnosis not present

## 2020-10-15 DIAGNOSIS — D62 Acute posthemorrhagic anemia: Secondary | ICD-10-CM | POA: Diagnosis not present

## 2020-10-15 DIAGNOSIS — J9600 Acute respiratory failure, unspecified whether with hypoxia or hypercapnia: Secondary | ICD-10-CM | POA: Diagnosis not present

## 2020-10-15 DIAGNOSIS — E870 Hyperosmolality and hypernatremia: Secondary | ICD-10-CM | POA: Diagnosis not present

## 2020-10-15 LAB — BASIC METABOLIC PANEL
Anion gap: 14 (ref 5–15)
BUN: 151 mg/dL — ABNORMAL HIGH (ref 6–20)
CO2: 26 mmol/L (ref 22–32)
Calcium: 7.6 mg/dL — ABNORMAL LOW (ref 8.9–10.3)
Chloride: 120 mmol/L — ABNORMAL HIGH (ref 98–111)
Creatinine, Ser: 2.84 mg/dL — ABNORMAL HIGH (ref 0.44–1.00)
GFR, Estimated: 18 mL/min — ABNORMAL LOW (ref >60)
Glucose, Bld: 166 mg/dL — ABNORMAL HIGH (ref 70–99)
Potassium: 3.6 mmol/L (ref 3.5–5.1)
Sodium: 160 mmol/L — ABNORMAL HIGH (ref 135–145)

## 2020-10-15 LAB — GLUCOSE, CAPILLARY
Glucose-Capillary: 109 mg/dL — ABNORMAL HIGH (ref 70–99)
Glucose-Capillary: 162 mg/dL — ABNORMAL HIGH (ref 70–99)
Glucose-Capillary: 137 mg/dL — ABNORMAL HIGH (ref 70–99)
Glucose-Capillary: 152 mg/dL — ABNORMAL HIGH (ref 70–99)
Glucose-Capillary: 158 mg/dL — ABNORMAL HIGH (ref 70–99)
Glucose-Capillary: 94 mg/dL (ref 70–99)

## 2020-10-15 LAB — CBC
HCT: 24 % — ABNORMAL LOW (ref 36.0–46.0)
Hemoglobin: 6.8 g/dL — CL (ref 12.0–15.0)
MCH: 29.3 pg (ref 26.0–34.0)
MCHC: 28.3 g/dL — ABNORMAL LOW (ref 30.0–36.0)
MCV: 103.4 fL — ABNORMAL HIGH (ref 80.0–100.0)
Platelets: 300 10*3/uL (ref 150–400)
RBC: 2.32 MIL/uL — ABNORMAL LOW (ref 3.87–5.11)
RDW: 17.2 % — ABNORMAL HIGH (ref 11.5–15.5)
WBC: 17.8 10*3/uL — ABNORMAL HIGH (ref 4.0–10.5)
nRBC: 0.6 % — ABNORMAL HIGH (ref 0.0–0.2)

## 2020-10-15 LAB — PREPARE RBC (CROSSMATCH)

## 2020-10-15 MED ORDER — FREE WATER
200.0000 mL | Status: DC
  Administered 2020-10-15 – 2020-10-17 (×13): 200 mL

## 2020-10-15 MED ORDER — SODIUM CHLORIDE 0.9% IV SOLUTION: Freq: Once | INTRAVENOUS | Status: AC
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
## 2020-10-15 NOTE — Progress Notes (Signed)
CRITICAL VALUE ALERT  Critical Value:  Hemoglobin 6.8  Date & Time Notied:  10/15/2020 0621  Provider Notified: Dr. Andrey Campanile  Orders Received/Actions taken: MD updated. No new orders. Rounding team to decide this AM.

## 2020-10-15 NOTE — Progress Notes (Signed)
Foley catheter urine bag began leaking. Seal broken to replace bag.

## 2020-10-16 ENCOUNTER — Ambulatory Visit: Payer: Medicaid Other | Admitting: Podiatry

## 2020-10-16 DIAGNOSIS — E878 Other disorders of electrolyte and fluid balance, not elsewhere classified: Secondary | ICD-10-CM | POA: Diagnosis not present

## 2020-10-16 DIAGNOSIS — D62 Acute posthemorrhagic anemia: Secondary | ICD-10-CM | POA: Diagnosis not present

## 2020-10-16 DIAGNOSIS — S2243XA Multiple fractures of ribs, bilateral, initial encounter for closed fracture: Secondary | ICD-10-CM | POA: Diagnosis not present

## 2020-10-16 DIAGNOSIS — E870 Hyperosmolality and hypernatremia: Secondary | ICD-10-CM | POA: Diagnosis not present

## 2020-10-16 DIAGNOSIS — N179 Acute kidney failure, unspecified: Secondary | ICD-10-CM | POA: Diagnosis not present

## 2020-10-16 DIAGNOSIS — J9600 Acute respiratory failure, unspecified whether with hypoxia or hypercapnia: Secondary | ICD-10-CM | POA: Diagnosis not present

## 2020-10-16 LAB — BPAM RBC
Blood Product Expiration Date: 202111182359
Blood Product Expiration Date: 202111202359
Blood Product Expiration Date: 202111202359
ISSUE DATE / TIME: 202110141448
ISSUE DATE / TIME: 202110171031
ISSUE DATE / TIME: 202110171442
Unit Type and Rh: 5100
Unit Type and Rh: 5100
Unit Type and Rh: 5100

## 2020-10-16 LAB — GLUCOSE, CAPILLARY
Glucose-Capillary: 122 mg/dL — ABNORMAL HIGH (ref 70–99)
Glucose-Capillary: 132 mg/dL — ABNORMAL HIGH (ref 70–99)
Glucose-Capillary: 140 mg/dL — ABNORMAL HIGH (ref 70–99)
Glucose-Capillary: 145 mg/dL — ABNORMAL HIGH (ref 70–99)
Glucose-Capillary: 146 mg/dL — ABNORMAL HIGH (ref 70–99)
Glucose-Capillary: 152 mg/dL — ABNORMAL HIGH (ref 70–99)

## 2020-10-16 LAB — MAGNESIUM: Magnesium: 2.4 mg/dL (ref 1.7–2.4)

## 2020-10-16 LAB — BASIC METABOLIC PANEL
Anion gap: 15 (ref 5–15)
Anion gap: 12 (ref 5–15)
BUN: 158 mg/dL — ABNORMAL HIGH (ref 6–20)
BUN: 147 mg/dL — ABNORMAL HIGH (ref 6–20)
CO2: 25 mmol/L (ref 22–32)
CO2: 25 mmol/L (ref 22–32)
Calcium: 8 mg/dL — ABNORMAL LOW (ref 8.9–10.3)
Calcium: 8 mg/dL — ABNORMAL LOW (ref 8.9–10.3)
Chloride: 119 mmol/L — ABNORMAL HIGH (ref 98–111)
Chloride: 121 mmol/L — ABNORMAL HIGH (ref 98–111)
Creatinine, Ser: 2.72 mg/dL — ABNORMAL HIGH (ref 0.44–1.00)
Creatinine, Ser: 2.38 mg/dL — ABNORMAL HIGH (ref 0.44–1.00)
GFR, Estimated: 19 mL/min — ABNORMAL LOW (ref >60)
GFR, Estimated: 23 mL/min — ABNORMAL LOW (ref >60)
Glucose, Bld: 164 mg/dL — ABNORMAL HIGH (ref 70–99)
Glucose, Bld: 134 mg/dL — ABNORMAL HIGH (ref 70–99)
Potassium: 3.5 mmol/L (ref 3.5–5.1)
Potassium: 3.8 mmol/L (ref 3.5–5.1)
Sodium: 159 mmol/L — ABNORMAL HIGH (ref 135–145)
Sodium: 158 mmol/L — ABNORMAL HIGH (ref 135–145)

## 2020-10-16 LAB — TYPE AND SCREEN
ABO/RH(D): O POS
Antibody Screen: NEGATIVE
Unit division: 0
Unit division: 0
Unit division: 0

## 2020-10-16 LAB — CBC
HCT: 28.2 % — ABNORMAL LOW (ref 36.0–46.0)
Hemoglobin: 8.3 g/dL — ABNORMAL LOW (ref 12.0–15.0)
MCH: 29.5 pg (ref 26.0–34.0)
MCHC: 29.4 g/dL — ABNORMAL LOW (ref 30.0–36.0)
MCV: 100.4 fL — ABNORMAL HIGH (ref 80.0–100.0)
Platelets: 279 10*3/uL (ref 150–400)
RBC: 2.81 MIL/uL — ABNORMAL LOW (ref 3.87–5.11)
RDW: 16.9 % — ABNORMAL HIGH (ref 11.5–15.5)
WBC: 18.9 10*3/uL — ABNORMAL HIGH (ref 4.0–10.5)
nRBC: 0.6 % — ABNORMAL HIGH (ref 0.0–0.2)

## 2020-10-16 MED ORDER — DEXTROSE-NACL 5-0.45 % IV SOLN: INTRAVENOUS | Status: AC

## 2020-10-16 MED ORDER — MIDODRINE HCL 5 MG PO TABS: 5.0000 mg | ORAL_TABLET | Freq: Three times a day (TID) | ORAL | Status: DC

## 2020-10-16 MED ORDER — POTASSIUM CHLORIDE 20 MEQ/15ML (10%) PO SOLN
40.0000 meq | Freq: Once | ORAL | Status: AC
  Administered 2020-10-16: 40 meq
  Filled 2020-10-16: qty 30

## 2020-10-16 MED ORDER — DEXTROSE-NACL 5-0.2 % IV SOLN: INTRAVENOUS | Status: DC

## 2020-10-16 MED ORDER — MIDODRINE HCL 5 MG PO TABS
5.0000 mg | ORAL_TABLET | Freq: Three times a day (TID) | ORAL | Status: DC
  Administered 2020-10-16 – 2020-10-23 (×22): 5 mg
  Filled 2020-10-16 (×22): qty 1

## 2020-10-16 MED ORDER — LACTATED RINGERS IV BOLUS
1500.0000 mL | Freq: Once | INTRAVENOUS | Status: DC
Start: 2020-10-16 — End: 2020-10-16
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
## 2020-10-16 NOTE — Progress Notes (Signed)
Patient placed back on full support for night time rest due to increasing respiratory rate and irritation.  Patient tolerating well.  RT will assess in the am and proceed with wean as tolerated.
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
## 2020-10-16 NOTE — Progress Notes (Signed)
Pt placed on PSV 12/8 and is tolerating well at this time. RN aware. RT to continue to monitor.

## 2020-10-17 DIAGNOSIS — E870 Hyperosmolality and hypernatremia: Secondary | ICD-10-CM | POA: Diagnosis not present

## 2020-10-17 DIAGNOSIS — N179 Acute kidney failure, unspecified: Secondary | ICD-10-CM | POA: Diagnosis not present

## 2020-10-17 DIAGNOSIS — J9600 Acute respiratory failure, unspecified whether with hypoxia or hypercapnia: Secondary | ICD-10-CM | POA: Diagnosis not present

## 2020-10-17 DIAGNOSIS — S2243XA Multiple fractures of ribs, bilateral, initial encounter for closed fracture: Secondary | ICD-10-CM | POA: Diagnosis not present

## 2020-10-17 DIAGNOSIS — D62 Acute posthemorrhagic anemia: Secondary | ICD-10-CM | POA: Diagnosis not present

## 2020-10-17 DIAGNOSIS — E878 Other disorders of electrolyte and fluid balance, not elsewhere classified: Secondary | ICD-10-CM | POA: Diagnosis not present

## 2020-10-17 LAB — GLUCOSE, CAPILLARY
Glucose-Capillary: 134 mg/dL — ABNORMAL HIGH (ref 70–99)
Glucose-Capillary: 109 mg/dL — ABNORMAL HIGH (ref 70–99)
Glucose-Capillary: 134 mg/dL — ABNORMAL HIGH (ref 70–99)
Glucose-Capillary: 128 mg/dL — ABNORMAL HIGH (ref 70–99)
Glucose-Capillary: 148 mg/dL — ABNORMAL HIGH (ref 70–99)
Glucose-Capillary: 131 mg/dL — ABNORMAL HIGH (ref 70–99)

## 2020-10-17 LAB — CBC
HCT: 31.3 % — ABNORMAL LOW (ref 36.0–46.0)
Hemoglobin: 9.2 g/dL — ABNORMAL LOW (ref 12.0–15.0)
MCH: 29.9 pg (ref 26.0–34.0)
MCHC: 29.4 g/dL — ABNORMAL LOW (ref 30.0–36.0)
MCV: 101.6 fL — ABNORMAL HIGH (ref 80.0–100.0)
Platelets: 316 10*3/uL (ref 150–400)
RBC: 3.08 MIL/uL — ABNORMAL LOW (ref 3.87–5.11)
RDW: 16.4 % — ABNORMAL HIGH (ref 11.5–15.5)
WBC: 18 10*3/uL — ABNORMAL HIGH (ref 4.0–10.5)
nRBC: 0.3 % — ABNORMAL HIGH (ref 0.0–0.2)

## 2020-10-17 LAB — BASIC METABOLIC PANEL
Anion gap: 12 (ref 5–15)
BUN: 142 mg/dL — ABNORMAL HIGH (ref 6–20)
CO2: 25 mmol/L (ref 22–32)
Calcium: 8.2 mg/dL — ABNORMAL LOW (ref 8.9–10.3)
Chloride: 121 mmol/L — ABNORMAL HIGH (ref 98–111)
Creatinine, Ser: 2.27 mg/dL — ABNORMAL HIGH (ref 0.44–1.00)
GFR, Estimated: 24 mL/min — ABNORMAL LOW (ref >60)
Glucose, Bld: 140 mg/dL — ABNORMAL HIGH (ref 70–99)
Potassium: 3.8 mmol/L (ref 3.5–5.1)
Sodium: 158 mmol/L — ABNORMAL HIGH (ref 135–145)

## 2020-10-17 LAB — MAGNESIUM: Magnesium: 2.4 mg/dL (ref 1.7–2.4)

## 2020-10-17 LAB — PHOSPHORUS: Phosphorus: 3.9 mg/dL (ref 2.5–4.6)

## 2020-10-17 MED ORDER — JUVEN PO PACK
1.0000 | PACK | Freq: Two times a day (BID) | ORAL | Status: DC
  Administered 2020-10-17 – 2020-10-30 (×26): 1
  Filled 2020-10-17 (×27): qty 1

## 2020-10-17 MED ORDER — FREE WATER
250.0000 mL | Status: DC
  Administered 2020-10-17 – 2020-10-23 (×36): 250 mL

## 2020-10-17 MED ORDER — DEXTROSE-NACL 5-0.45 % IV SOLN: INTRAVENOUS | Status: DC

## 2020-10-17 MED ORDER — PIVOT 1.5 CAL PO LIQD
1000.0000 mL | ORAL | Status: DC
  Administered 2020-10-17 – 2020-11-01 (×11): 1000 mL
  Filled 2020-10-17 (×21): qty 1000

## 2020-10-17 NOTE — Consult Note (Signed)
WOC Nurse ostomy follow up Stoma type/location: LLQ, end colostomy Stomal assessment/size: 1 5/8" budded, pink, moist.  Peristomal assessment: NA Treatment options for stomal/peristomal skin: using 2" barrier ring Output liquid brown Ostomy pouching: 2pc.  Education provided: not appropriate at this time; no family in the room Enrolled patient in [facility] Secure Start Discharge program: No  WOC Nurse will follow along with you for continued support with ostomy teaching and care Shameria Trimarco [employer] MSN, RN, [place], CNS, [place] [phone]
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
## 2020-10-18 DIAGNOSIS — E878 Other disorders of electrolyte and fluid balance, not elsewhere classified: Secondary | ICD-10-CM | POA: Diagnosis not present

## 2020-10-18 DIAGNOSIS — J9600 Acute respiratory failure, unspecified whether with hypoxia or hypercapnia: Secondary | ICD-10-CM | POA: Diagnosis not present

## 2020-10-18 DIAGNOSIS — S2243XA Multiple fractures of ribs, bilateral, initial encounter for closed fracture: Secondary | ICD-10-CM | POA: Diagnosis not present

## 2020-10-18 DIAGNOSIS — E870 Hyperosmolality and hypernatremia: Secondary | ICD-10-CM | POA: Diagnosis not present

## 2020-10-18 DIAGNOSIS — D62 Acute posthemorrhagic anemia: Secondary | ICD-10-CM | POA: Diagnosis not present

## 2020-10-18 DIAGNOSIS — N179 Acute kidney failure, unspecified: Secondary | ICD-10-CM | POA: Diagnosis not present

## 2020-10-18 LAB — MAGNESIUM: Magnesium: 2.3 mg/dL (ref 1.7–2.4)

## 2020-10-18 LAB — CBC
HCT: 31 % — ABNORMAL LOW (ref 36.0–46.0)
Hemoglobin: 9.1 g/dL — ABNORMAL LOW (ref 12.0–15.0)
MCH: 29.8 pg (ref 26.0–34.0)
MCHC: 29.4 g/dL — ABNORMAL LOW (ref 30.0–36.0)
MCV: 101.6 fL — ABNORMAL HIGH (ref 80.0–100.0)
Platelets: 321 10*3/uL (ref 150–400)
RBC: 3.05 MIL/uL — ABNORMAL LOW (ref 3.87–5.11)
RDW: 16.6 % — ABNORMAL HIGH (ref 11.5–15.5)
WBC: 17.5 10*3/uL — ABNORMAL HIGH (ref 4.0–10.5)
nRBC: 0.2 % (ref 0.0–0.2)

## 2020-10-18 LAB — GLUCOSE, CAPILLARY
Glucose-Capillary: 130 mg/dL — ABNORMAL HIGH (ref 70–99)
Glucose-Capillary: 157 mg/dL — ABNORMAL HIGH (ref 70–99)
Glucose-Capillary: 143 mg/dL — ABNORMAL HIGH (ref 70–99)
Glucose-Capillary: 137 mg/dL — ABNORMAL HIGH (ref 70–99)
Glucose-Capillary: 114 mg/dL — ABNORMAL HIGH (ref 70–99)
Glucose-Capillary: 141 mg/dL — ABNORMAL HIGH (ref 70–99)

## 2020-10-18 LAB — BASIC METABOLIC PANEL
Anion gap: 12 (ref 5–15)
BUN: 137 mg/dL — ABNORMAL HIGH (ref 6–20)
CO2: 24 mmol/L (ref 22–32)
Calcium: 8.1 mg/dL — ABNORMAL LOW (ref 8.9–10.3)
Chloride: 119 mmol/L — ABNORMAL HIGH (ref 98–111)
Creatinine, Ser: 2.07 mg/dL — ABNORMAL HIGH (ref 0.44–1.00)
GFR, Estimated: 27 mL/min — ABNORMAL LOW (ref >60)
Glucose, Bld: 139 mg/dL — ABNORMAL HIGH (ref 70–99)
Potassium: 3.7 mmol/L (ref 3.5–5.1)
Sodium: 155 mmol/L — ABNORMAL HIGH (ref 135–145)

## 2020-10-18 LAB — PHOSPHORUS: Phosphorus: 3.7 mg/dL (ref 2.5–4.6)

## 2020-10-18 MED ORDER — LACTATED RINGERS IV BOLUS
1000.0000 mL | Freq: Once | INTRAVENOUS | Status: AC
  Administered 2020-10-18: 1000 mL via INTRAVENOUS
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
## 2020-10-18 NOTE — Addendum Note (Signed)
Addendum  created 10/18/20 2140 by Dorris Singh, MD   Clinical Note Signed, Image imported, Intraprocedure Blocks edited

## 2020-10-18 NOTE — Progress Notes (Signed)
Ortho PA Montez Morita to come check on stitches this week and give recommendation on removal.

## 2020-10-19 DIAGNOSIS — D62 Acute posthemorrhagic anemia: Secondary | ICD-10-CM | POA: Diagnosis not present

## 2020-10-19 DIAGNOSIS — N179 Acute kidney failure, unspecified: Secondary | ICD-10-CM | POA: Diagnosis not present

## 2020-10-19 DIAGNOSIS — J9600 Acute respiratory failure, unspecified whether with hypoxia or hypercapnia: Secondary | ICD-10-CM | POA: Diagnosis not present

## 2020-10-19 LAB — CBC
HCT: 30.9 % — ABNORMAL LOW (ref 36.0–46.0)
Hemoglobin: 9.2 g/dL — ABNORMAL LOW (ref 12.0–15.0)
MCH: 30.3 pg (ref 26.0–34.0)
MCHC: 29.8 g/dL — ABNORMAL LOW (ref 30.0–36.0)
MCV: 101.6 fL — ABNORMAL HIGH (ref 80.0–100.0)
Platelets: 336 10*3/uL (ref 150–400)
RBC: 3.04 MIL/uL — ABNORMAL LOW (ref 3.87–5.11)
RDW: 16.3 % — ABNORMAL HIGH (ref 11.5–15.5)
WBC: 17.7 10*3/uL — ABNORMAL HIGH (ref 4.0–10.5)
nRBC: 0 % (ref 0.0–0.2)

## 2020-10-19 LAB — MAGNESIUM: Magnesium: 2.3 mg/dL (ref 1.7–2.4)

## 2020-10-19 LAB — BASIC METABOLIC PANEL
Anion gap: 10 (ref 5–15)
BUN: 131 mg/dL — ABNORMAL HIGH (ref 6–20)
CO2: 25 mmol/L (ref 22–32)
Calcium: 8.4 mg/dL — ABNORMAL LOW (ref 8.9–10.3)
Chloride: 123 mmol/L — ABNORMAL HIGH (ref 98–111)
Creatinine, Ser: 2.02 mg/dL — ABNORMAL HIGH (ref 0.44–1.00)
GFR, Estimated: 28 mL/min — ABNORMAL LOW (ref >60)
Glucose, Bld: 162 mg/dL — ABNORMAL HIGH (ref 70–99)
Potassium: 3.8 mmol/L (ref 3.5–5.1)
Sodium: 158 mmol/L — ABNORMAL HIGH (ref 135–145)

## 2020-10-19 LAB — GLUCOSE, CAPILLARY
Glucose-Capillary: 112 mg/dL — ABNORMAL HIGH (ref 70–99)
Glucose-Capillary: 156 mg/dL — ABNORMAL HIGH (ref 70–99)
Glucose-Capillary: 117 mg/dL — ABNORMAL HIGH (ref 70–99)
Glucose-Capillary: 150 mg/dL — ABNORMAL HIGH (ref 70–99)
Glucose-Capillary: 136 mg/dL — ABNORMAL HIGH (ref 70–99)
Glucose-Capillary: 130 mg/dL — ABNORMAL HIGH (ref 70–99)

## 2020-10-19 LAB — PHOSPHORUS: Phosphorus: 3.2 mg/dL (ref 2.5–4.6)

## 2020-10-19 MED ORDER — SODIUM CHLORIDE 0.45 % IV BOLUS: 1000.0000 mL | Freq: Once | INTRAVENOUS | Status: DC

## 2020-10-19 MED ORDER — SODIUM CHLORIDE 0.45 % IV BOLUS
1000.0000 mL | Freq: Once | INTRAVENOUS | Status: AC
  Administered 2020-10-19: 1000 mL via INTRAVENOUS

## 2020-10-19 MED ORDER — IPRATROPIUM-ALBUTEROL 0.5-2.5 (3) MG/3ML IN SOLN: 3.0000 mL | Freq: Four times a day (QID) | RESPIRATORY_TRACT | Status: DC | PRN
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
## 2020-10-20 DIAGNOSIS — D62 Acute posthemorrhagic anemia: Secondary | ICD-10-CM | POA: Diagnosis not present

## 2020-10-20 DIAGNOSIS — J9600 Acute respiratory failure, unspecified whether with hypoxia or hypercapnia: Secondary | ICD-10-CM | POA: Diagnosis not present

## 2020-10-20 DIAGNOSIS — N179 Acute kidney failure, unspecified: Secondary | ICD-10-CM | POA: Diagnosis not present

## 2020-10-20 LAB — CBC
HCT: 31.6 % — ABNORMAL LOW (ref 36.0–46.0)
Hemoglobin: 9.3 g/dL — ABNORMAL LOW (ref 12.0–15.0)
MCH: 29.9 pg (ref 26.0–34.0)
MCHC: 29.4 g/dL — ABNORMAL LOW (ref 30.0–36.0)
MCV: 101.6 fL — ABNORMAL HIGH (ref 80.0–100.0)
Platelets: 337 10*3/uL (ref 150–400)
RBC: 3.11 MIL/uL — ABNORMAL LOW (ref 3.87–5.11)
RDW: 16.5 % — ABNORMAL HIGH (ref 11.5–15.5)
WBC: 20.5 10*3/uL — ABNORMAL HIGH (ref 4.0–10.5)
nRBC: 0 % (ref 0.0–0.2)

## 2020-10-20 LAB — GLUCOSE, CAPILLARY
Glucose-Capillary: 108 mg/dL — ABNORMAL HIGH (ref 70–99)
Glucose-Capillary: 158 mg/dL — ABNORMAL HIGH (ref 70–99)
Glucose-Capillary: 121 mg/dL — ABNORMAL HIGH (ref 70–99)
Glucose-Capillary: 123 mg/dL — ABNORMAL HIGH (ref 70–99)
Glucose-Capillary: 151 mg/dL — ABNORMAL HIGH (ref 70–99)
Glucose-Capillary: 132 mg/dL — ABNORMAL HIGH (ref 70–99)

## 2020-10-20 LAB — BASIC METABOLIC PANEL
Anion gap: 9 (ref 5–15)
BUN: 116 mg/dL — ABNORMAL HIGH (ref 6–20)
CO2: 23 mmol/L (ref 22–32)
Calcium: 7.9 mg/dL — ABNORMAL LOW (ref 8.9–10.3)
Chloride: 123 mmol/L — ABNORMAL HIGH (ref 98–111)
Creatinine, Ser: 1.7 mg/dL — ABNORMAL HIGH (ref 0.44–1.00)
GFR, Estimated: 36 mL/min — ABNORMAL LOW (ref >60)
Glucose, Bld: 368 mg/dL — ABNORMAL HIGH (ref 70–99)
Potassium: 3.5 mmol/L (ref 3.5–5.1)
Sodium: 155 mmol/L — ABNORMAL HIGH (ref 135–145)

## 2020-10-20 LAB — MAGNESIUM: Magnesium: 2.2 mg/dL (ref 1.7–2.4)

## 2020-10-20 LAB — PHOSPHORUS: Phosphorus: 2.7 mg/dL (ref 2.5–4.6)

## 2020-10-20 MED ORDER — SODIUM CHLORIDE 0.45 % IV BOLUS
1000.0000 mL | Freq: Once | INTRAVENOUS | Status: AC
  Administered 2020-10-20: 1000 mL via INTRAVENOUS
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
## 2020-10-20 NOTE — Consult Note (Addendum)
WOC Nurse ostomy consult note Stoma type/location:  Consult requested for frequently leaking ostomy pouch. Refer to previous progress notes from [facility] team. Stoma now has mucocutaneous seperation surrounding stoma from 5:00 o'clock to 9:00 o'clock; approx 3 cm wide, red and moist.  Stoma is red, moist, flush with skin level, 1 1/2 inches.   Mod amt liquid brown stool.  Pt also has a full thickness wound in a crease below the stoma; approx .3X1X.2cm, yellow moist woundbed, mod amt tan drainage.  Changed pouching ststem to flexible convex and applied Auqacel to seperation areas and wound below stoma, then protected site around stoma with barrier ring.   Topical treatment orders provided for bedside nurses to perform PRN if pouch is leaking as follows: Bedside nurse: If ostomy pouch is leaking please follow these steps:  Cut piece of Aquacel [place] # [phone]) and tuck into wound around stoma site and another below the ostomy, then cover with a barrier ring [place] # [phone])  Apply a convex pouch [place] # [phone]) Enrolled patient in [facility] DC program: Not yet WOC team will continue to follow weekly and begin teaching sessions when patient is stable and out of ICU.  No family present in the room during pouch change.  Cammie Mcgee MSN, RN, CWOCN, [place], CNS [phone]
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
## 2020-10-21 DIAGNOSIS — D62 Acute posthemorrhagic anemia: Secondary | ICD-10-CM | POA: Diagnosis not present

## 2020-10-21 DIAGNOSIS — N179 Acute kidney failure, unspecified: Secondary | ICD-10-CM | POA: Diagnosis not present

## 2020-10-21 DIAGNOSIS — J9600 Acute respiratory failure, unspecified whether with hypoxia or hypercapnia: Secondary | ICD-10-CM | POA: Diagnosis not present

## 2020-10-21 LAB — BASIC METABOLIC PANEL
Anion gap: 12 (ref 5–15)
BUN: 115 mg/dL — ABNORMAL HIGH (ref 6–20)
CO2: 22 mmol/L (ref 22–32)
Calcium: 8.3 mg/dL — ABNORMAL LOW (ref 8.9–10.3)
Chloride: 121 mmol/L — ABNORMAL HIGH (ref 98–111)
Creatinine, Ser: 1.62 mg/dL — ABNORMAL HIGH (ref 0.44–1.00)
GFR, Estimated: 38 mL/min — ABNORMAL LOW (ref >60)
Glucose, Bld: 173 mg/dL — ABNORMAL HIGH (ref 70–99)
Potassium: 3.5 mmol/L (ref 3.5–5.1)
Sodium: 155 mmol/L — ABNORMAL HIGH (ref 135–145)

## 2020-10-21 LAB — CBC
HCT: 31.4 % — ABNORMAL LOW (ref 36.0–46.0)
Hemoglobin: 9.4 g/dL — ABNORMAL LOW (ref 12.0–15.0)
MCH: 30.4 pg (ref 26.0–34.0)
MCHC: 29.9 g/dL — ABNORMAL LOW (ref 30.0–36.0)
MCV: 101.6 fL — ABNORMAL HIGH (ref 80.0–100.0)
Platelets: 334 10*3/uL (ref 150–400)
RBC: 3.09 MIL/uL — ABNORMAL LOW (ref 3.87–5.11)
RDW: 16.7 % — ABNORMAL HIGH (ref 11.5–15.5)
WBC: 19 10*3/uL — ABNORMAL HIGH (ref 4.0–10.5)
nRBC: 0 % (ref 0.0–0.2)

## 2020-10-21 LAB — GLUCOSE, CAPILLARY
Glucose-Capillary: 127 mg/dL — ABNORMAL HIGH (ref 70–99)
Glucose-Capillary: 141 mg/dL — ABNORMAL HIGH (ref 70–99)
Glucose-Capillary: 98 mg/dL (ref 70–99)
Glucose-Capillary: 146 mg/dL — ABNORMAL HIGH (ref 70–99)
Glucose-Capillary: 117 mg/dL — ABNORMAL HIGH (ref 70–99)
Glucose-Capillary: 127 mg/dL — ABNORMAL HIGH (ref 70–99)
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
## 2020-10-22 DIAGNOSIS — N179 Acute kidney failure, unspecified: Secondary | ICD-10-CM | POA: Diagnosis not present

## 2020-10-22 DIAGNOSIS — J9600 Acute respiratory failure, unspecified whether with hypoxia or hypercapnia: Secondary | ICD-10-CM | POA: Diagnosis not present

## 2020-10-22 DIAGNOSIS — D62 Acute posthemorrhagic anemia: Secondary | ICD-10-CM | POA: Diagnosis not present

## 2020-10-22 LAB — CBC
HCT: 32.3 % — ABNORMAL LOW (ref 36.0–46.0)
Hemoglobin: 9.4 g/dL — ABNORMAL LOW (ref 12.0–15.0)
MCH: 29.7 pg (ref 26.0–34.0)
MCHC: 29.1 g/dL — ABNORMAL LOW (ref 30.0–36.0)
MCV: 101.9 fL — ABNORMAL HIGH (ref 80.0–100.0)
Platelets: 324 10*3/uL (ref 150–400)
RBC: 3.17 MIL/uL — ABNORMAL LOW (ref 3.87–5.11)
RDW: 17 % — ABNORMAL HIGH (ref 11.5–15.5)
WBC: 17.3 10*3/uL — ABNORMAL HIGH (ref 4.0–10.5)
nRBC: 0 % (ref 0.0–0.2)

## 2020-10-22 LAB — GLUCOSE, CAPILLARY
Glucose-Capillary: 107 mg/dL — ABNORMAL HIGH (ref 70–99)
Glucose-Capillary: 109 mg/dL — ABNORMAL HIGH (ref 70–99)
Glucose-Capillary: 110 mg/dL — ABNORMAL HIGH (ref 70–99)
Glucose-Capillary: 120 mg/dL — ABNORMAL HIGH (ref 70–99)
Glucose-Capillary: 142 mg/dL — ABNORMAL HIGH (ref 70–99)
Glucose-Capillary: 154 mg/dL — ABNORMAL HIGH (ref 70–99)

## 2020-10-22 LAB — BASIC METABOLIC PANEL
Anion gap: 8 (ref 5–15)
BUN: 106 mg/dL — ABNORMAL HIGH (ref 6–20)
CO2: 23 mmol/L (ref 22–32)
Calcium: 8.4 mg/dL — ABNORMAL LOW (ref 8.9–10.3)
Chloride: 123 mmol/L — ABNORMAL HIGH (ref 98–111)
Creatinine, Ser: 1.51 mg/dL — ABNORMAL HIGH (ref 0.44–1.00)
GFR, Estimated: 41 mL/min — ABNORMAL LOW (ref >60)
Glucose, Bld: 125 mg/dL — ABNORMAL HIGH (ref 70–99)
Potassium: 3.6 mmol/L (ref 3.5–5.1)
Sodium: 154 mmol/L — ABNORMAL HIGH (ref 135–145)
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
## 2020-10-23 DIAGNOSIS — N179 Acute kidney failure, unspecified: Secondary | ICD-10-CM | POA: Diagnosis not present

## 2020-10-23 DIAGNOSIS — J9600 Acute respiratory failure, unspecified whether with hypoxia or hypercapnia: Secondary | ICD-10-CM | POA: Diagnosis not present

## 2020-10-23 DIAGNOSIS — D62 Acute posthemorrhagic anemia: Secondary | ICD-10-CM | POA: Diagnosis not present

## 2020-10-23 LAB — BASIC METABOLIC PANEL
Anion gap: 12 (ref 5–15)
BUN: 97 mg/dL — ABNORMAL HIGH (ref 6–20)
CO2: 20 mmol/L — ABNORMAL LOW (ref 22–32)
Calcium: 8.5 mg/dL — ABNORMAL LOW (ref 8.9–10.3)
Chloride: 122 mmol/L — ABNORMAL HIGH (ref 98–111)
Creatinine, Ser: 1.47 mg/dL — ABNORMAL HIGH (ref 0.44–1.00)
GFR, Estimated: 43 mL/min — ABNORMAL LOW (ref >60)
Glucose, Bld: 170 mg/dL — ABNORMAL HIGH (ref 70–99)
Potassium: 3.6 mmol/L (ref 3.5–5.1)
Sodium: 154 mmol/L — ABNORMAL HIGH (ref 135–145)

## 2020-10-23 LAB — GLUCOSE, CAPILLARY
Glucose-Capillary: 122 mg/dL — ABNORMAL HIGH (ref 70–99)
Glucose-Capillary: 130 mg/dL — ABNORMAL HIGH (ref 70–99)
Glucose-Capillary: 139 mg/dL — ABNORMAL HIGH (ref 70–99)
Glucose-Capillary: 144 mg/dL — ABNORMAL HIGH (ref 70–99)
Glucose-Capillary: 146 mg/dL — ABNORMAL HIGH (ref 70–99)
Glucose-Capillary: 97 mg/dL (ref 70–99)

## 2020-10-23 LAB — CBC
HCT: 31.9 % — ABNORMAL LOW (ref 36.0–46.0)
Hemoglobin: 9.5 g/dL — ABNORMAL LOW (ref 12.0–15.0)
MCH: 30 pg (ref 26.0–34.0)
MCHC: 29.8 g/dL — ABNORMAL LOW (ref 30.0–36.0)
MCV: 100.6 fL — ABNORMAL HIGH (ref 80.0–100.0)
Platelets: 305 10*3/uL (ref 150–400)
RBC: 3.17 MIL/uL — ABNORMAL LOW (ref 3.87–5.11)
RDW: 16.9 % — ABNORMAL HIGH (ref 11.5–15.5)
WBC: 18.4 10*3/uL — ABNORMAL HIGH (ref 4.0–10.5)
nRBC: 0 % (ref 0.0–0.2)

## 2020-10-23 MED ORDER — ENOXAPARIN SODIUM 40 MG/0.4ML ~~LOC~~ SOLN
40.0000 mg | Freq: Two times a day (BID) | SUBCUTANEOUS | Status: DC
  Administered 2020-10-23 – 2020-11-06 (×27): 40 mg via SUBCUTANEOUS
  Filled 2020-10-23 (×27): qty 0.4

## 2020-10-23 MED ORDER — ASCORBIC ACID 500 MG PO TABS
500.0000 mg | ORAL_TABLET | Freq: Two times a day (BID) | ORAL | Status: DC
  Administered 2020-10-23 – 2020-10-31 (×18): 500 mg
  Filled 2020-10-23 (×18): qty 1

## 2020-10-23 MED ORDER — BUSPIRONE HCL 5 MG PO TABS
10.0000 mg | ORAL_TABLET | Freq: Three times a day (TID) | ORAL | Status: DC
  Administered 2020-10-23 – 2020-10-25 (×8): 10 mg via ORAL
  Filled 2020-10-23: qty 2
  Filled 2020-10-23: qty 1
  Filled 2020-10-23 (×3): qty 2
  Filled 2020-10-23: qty 1
  Filled 2020-10-23 (×2): qty 2

## 2020-10-23 MED ORDER — MIDODRINE HCL 5 MG PO TABS
2.5000 mg | ORAL_TABLET | Freq: Three times a day (TID) | ORAL | Status: DC
  Administered 2020-10-23 – 2020-10-26 (×7): 2.5 mg
  Filled 2020-10-23 (×8): qty 1

## 2020-10-23 MED ORDER — DEXTROSE 5 % IV SOLN: INTRAVENOUS | Status: DC

## 2020-10-23 MED ORDER — ZINC SULFATE 220 (50 ZN) MG PO CAPS
220.0000 mg | ORAL_CAPSULE | Freq: Every day | ORAL | Status: DC
  Filled 2020-10-23: qty 1

## 2020-10-23 MED ORDER — FREE WATER
500.0000 mL | Status: DC
  Administered 2020-10-23 – 2020-10-30 (×53): 500 mL

## 2020-10-23 MED ORDER — ASCORBIC ACID 500 MG PO TABS
500.0000 mg | ORAL_TABLET | Freq: Two times a day (BID) | ORAL | Status: DC
  Filled 2020-10-23: qty 1

## 2020-10-23 MED ORDER — ZINC SULFATE 220 (50 ZN) MG PO CAPS
220.0000 mg | ORAL_CAPSULE | Freq: Every day | ORAL | Status: DC
  Administered 2020-10-23 – 2020-10-31 (×9): 220 mg
  Filled 2020-10-23 (×9): qty 1

## 2020-10-23 MED ORDER — FREE WATER: 400.0000 mL | Status: DC
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
## 2020-10-23 NOTE — Progress Notes (Signed)
Inpatient Rehab Admissions Coordinator:   At this time we are recommending a CIR consult and I will place an order per our protocol.    Estill Dooms, PT, DPT Admissions Coordinator [phone] 10/23/20  2:42 PM
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
## 2020-10-23 NOTE — Consult Note (Signed)
WOC Nurse ostomy follow up Stoma type/location: LLQ colostomy Stomal assessment/size: 1 1/2" pink, flush moist stoma, Stomal separation from 4 to 10 o'clock noted.  Implemented topical wound care for this area last week and MC separation is unchanged. Pouch has been intact x 3 days.    Peristomal assessment: MC separation, noted above.  Treatment options for stomal/peristomal skin: barrier ring convex pouch Output soft brown stool Ostomy pouching: 1pc. Convex with barrier ring.  AquACEL aG FOR MC separation from 4 to 10 o'clock.  Education provided: Patient opens eyes when spoken to, but noncommunicative otherwise.  Enrolled patient in [place] Secure Start Discharge program: No Will follow.  Maple Hudson MSN, RN, FNP-BC CWON Wound, Ostomy, Continence Nurse Pager [phone]
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
## 2020-10-24 DIAGNOSIS — D62 Acute posthemorrhagic anemia: Secondary | ICD-10-CM | POA: Diagnosis not present

## 2020-10-24 DIAGNOSIS — N179 Acute kidney failure, unspecified: Secondary | ICD-10-CM | POA: Diagnosis not present

## 2020-10-24 DIAGNOSIS — J9600 Acute respiratory failure, unspecified whether with hypoxia or hypercapnia: Secondary | ICD-10-CM | POA: Diagnosis not present

## 2020-10-24 LAB — GLUCOSE, CAPILLARY
Glucose-Capillary: 131 mg/dL — ABNORMAL HIGH (ref 70–99)
Glucose-Capillary: 121 mg/dL — ABNORMAL HIGH (ref 70–99)
Glucose-Capillary: 128 mg/dL — ABNORMAL HIGH (ref 70–99)
Glucose-Capillary: 106 mg/dL — ABNORMAL HIGH (ref 70–99)
Glucose-Capillary: 136 mg/dL — ABNORMAL HIGH (ref 70–99)

## 2020-10-24 LAB — CBC
HCT: 31.7 % — ABNORMAL LOW (ref 36.0–46.0)
Hemoglobin: 9.6 g/dL — ABNORMAL LOW (ref 12.0–15.0)
MCH: 30.7 pg (ref 26.0–34.0)
MCHC: 30.3 g/dL (ref 30.0–36.0)
MCV: 101.3 fL — ABNORMAL HIGH (ref 80.0–100.0)
Platelets: 292 10*3/uL (ref 150–400)
RBC: 3.13 MIL/uL — ABNORMAL LOW (ref 3.87–5.11)
RDW: 17.5 % — ABNORMAL HIGH (ref 11.5–15.5)
WBC: 18 10*3/uL — ABNORMAL HIGH (ref 4.0–10.5)
nRBC: 0 % (ref 0.0–0.2)

## 2020-10-24 LAB — BASIC METABOLIC PANEL
Anion gap: 6 (ref 5–15)
BUN: 88 mg/dL — ABNORMAL HIGH (ref 6–20)
CO2: 23 mmol/L (ref 22–32)
Calcium: 8.4 mg/dL — ABNORMAL LOW (ref 8.9–10.3)
Chloride: 120 mmol/L — ABNORMAL HIGH (ref 98–111)
Creatinine, Ser: 1.4 mg/dL — ABNORMAL HIGH (ref 0.44–1.00)
GFR, Estimated: 45 mL/min — ABNORMAL LOW (ref >60)
Glucose, Bld: 149 mg/dL — ABNORMAL HIGH (ref 70–99)
Potassium: 3.9 mmol/L (ref 3.5–5.1)
Sodium: 149 mmol/L — ABNORMAL HIGH (ref 135–145)

## 2020-10-24 MED ORDER — ACETAMINOPHEN 160 MG/5ML PO SOLN
650.0000 mg | Freq: Four times a day (QID) | ORAL | Status: DC
  Administered 2020-10-24 – 2020-10-25 (×4): 650 mg via ORAL
  Filled 2020-10-24 (×7): qty 20.3

## 2020-10-24 MED ORDER — METHOCARBAMOL 500 MG PO TABS
500.0000 mg | ORAL_TABLET | Freq: Three times a day (TID) | ORAL | Status: DC
  Administered 2020-10-24 – 2020-10-30 (×20): 500 mg
  Filled 2020-10-24 (×20): qty 1

## 2020-10-24 MED ORDER — MORPHINE SULFATE (PF) 2 MG/ML IV SOLN
2.0000 mg | INTRAVENOUS | Status: DC | PRN
  Administered 2020-10-25 – 2020-11-09 (×6): 2 mg via INTRAVENOUS
  Administered 2020-11-10 (×2): 4 mg via INTRAVENOUS
  Administered 2020-11-10: 2 mg via INTRAVENOUS
  Administered 2020-11-11 – 2020-11-12 (×3): 4 mg via INTRAVENOUS
  Administered 2020-11-13: 2 mg via INTRAVENOUS
  Administered 2020-11-13 – 2020-11-18 (×8): 4 mg via INTRAVENOUS
  Administered 2020-11-18: 2 mg via INTRAVENOUS
  Administered 2020-11-20: 4 mg via INTRAVENOUS
  Administered 2020-11-20 – 2020-12-08 (×2): 2 mg via INTRAVENOUS
  Filled 2020-10-24: qty 2
  Filled 2020-10-24: qty 1
  Filled 2020-10-24 (×3): qty 2
  Filled 2020-10-24 (×2): qty 1
  Filled 2020-10-24: qty 2
  Filled 2020-10-24 (×2): qty 1
  Filled 2020-10-24: qty 2
  Filled 2020-10-24: qty 1
  Filled 2020-10-24 (×5): qty 2
  Filled 2020-10-24 (×2): qty 1
  Filled 2020-10-24: qty 2
  Filled 2020-10-24 (×2): qty 1
  Filled 2020-10-24 (×2): qty 2
  Filled 2020-10-24 (×2): qty 1
  Filled 2020-10-24: qty 2

## 2020-10-24 NOTE — Progress Notes (Signed)
Inpatient Rehabilitation Admissions Coordinator  Inpatient rehab consult received. I will meet with patient tomorrow for full assessment and recommendations.  Ottie Glazier, RN, MSN Rehab Admissions Coordinator [phone] 10/24/2020 5:13 PM
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
## 2020-10-24 NOTE — Procedures (Signed)
Tracheostomy Change Note  Patient Details:   Name: Sara [name] DOB: 1966/01/23 MRN: [id number]    Airway Documentation:     Evaluation  O2 sats: stable throughout Complications: No apparent complications Patient did tolerate procedure well. Bilateral Breath Sounds: Other (Comment)   RT x2 changed pt's #6 SH CF trach to #6 SH CFL per MD order with no complications. Pt did have positive color change on CO2 detector. Pt tolerated procedure well with SVS. RT will continue to monitor pt.   Megan Mans 10/24/2020, 4:12 PM

## 2020-10-24 NOTE — Progress Notes (Signed)
Pt unavailable to receive CPT through vest. Will try again at next scheduled time.
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
## 2020-10-25 DIAGNOSIS — D62 Acute posthemorrhagic anemia: Secondary | ICD-10-CM | POA: Diagnosis not present

## 2020-10-25 DIAGNOSIS — J9600 Acute respiratory failure, unspecified whether with hypoxia or hypercapnia: Secondary | ICD-10-CM | POA: Diagnosis not present

## 2020-10-25 DIAGNOSIS — N179 Acute kidney failure, unspecified: Secondary | ICD-10-CM | POA: Diagnosis not present

## 2020-10-25 LAB — GLUCOSE, CAPILLARY
Glucose-Capillary: 109 mg/dL — ABNORMAL HIGH (ref 70–99)
Glucose-Capillary: 109 mg/dL — ABNORMAL HIGH (ref 70–99)
Glucose-Capillary: 120 mg/dL — ABNORMAL HIGH (ref 70–99)
Glucose-Capillary: 134 mg/dL — ABNORMAL HIGH (ref 70–99)
Glucose-Capillary: 140 mg/dL — ABNORMAL HIGH (ref 70–99)
Glucose-Capillary: 142 mg/dL — ABNORMAL HIGH (ref 70–99)
Glucose-Capillary: 96 mg/dL (ref 70–99)

## 2020-10-25 LAB — BASIC METABOLIC PANEL
Anion gap: 16 — ABNORMAL HIGH (ref 5–15)
BUN: 74 mg/dL — ABNORMAL HIGH (ref 6–20)
CO2: 13 mmol/L — ABNORMAL LOW (ref 22–32)
Calcium: 8.5 mg/dL — ABNORMAL LOW (ref 8.9–10.3)
Chloride: 115 mmol/L — ABNORMAL HIGH (ref 98–111)
Creatinine, Ser: 1.32 mg/dL — ABNORMAL HIGH (ref 0.44–1.00)
GFR, Estimated: 49 mL/min — ABNORMAL LOW (ref >60)
Glucose, Bld: 128 mg/dL — ABNORMAL HIGH (ref 70–99)
Potassium: 5.1 mmol/L (ref 3.5–5.1)
Sodium: 144 mmol/L (ref 135–145)

## 2020-10-25 MED ORDER — BUSPIRONE HCL 5 MG PO TABS
10.0000 mg | ORAL_TABLET | Freq: Three times a day (TID) | ORAL | Status: DC
  Administered 2020-10-26 – 2020-10-31 (×16): 10 mg
  Filled 2020-10-25 (×18): qty 2

## 2020-10-25 MED ORDER — ACETAMINOPHEN 160 MG/5ML PO SOLN
650.0000 mg | Freq: Four times a day (QID) | ORAL | Status: DC
  Administered 2020-10-25 – 2020-11-01 (×23): 650 mg
  Filled 2020-10-25 (×24): qty 20.3

## 2020-10-25 NOTE — Progress Notes (Signed)
RT attempted x2 to perform CPT pt was unavailable. Will attempt at next scheduled time.
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
## 2020-10-25 NOTE — Progress Notes (Addendum)
Orthopedic Tech Progress Note Patient Details:  Sara [name] 1966-08-22 [id number] Took off cam walker boot and then applied PRAFO BOOT. Ortho Devices Type of Ortho Device: Prafo boot/shoe Ortho Device/Splint Location: RLE Ortho Device/Splint Interventions: Ordered, Application, Adjustment   Post Interventions Patient Tolerated: Well Instructions Provided: Care of device   Donald Pore 10/25/2020, 1:43 PM
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
## 2020-10-25 NOTE — Progress Notes (Addendum)
Inpatient Rehabilitation Admissions Coordinator  Inpatient rehab consult received. I met with patient at bedside and left a voicemail for her daughter, Sara [name] to contact me. Noted PT and OT have changed their recommendations to SNF. Patient is not at a level to tolerate the intensity of Cir admit. She will need prolonged rehab recovery. I await call back from her daughter to further discus her rehab options. I will update TOC team.  Danne Baxter, RN, MSN Rehab Admissions Coordinator [phone] 10/25/2020 4:14 PM   I spoke with patient's daughter by phone and she is aware that SNF is recommended. We will sign off at this time.  Danne Baxter, RN, MSN Rehab Admissions Coordinator [phone] 10/25/2020 5:29 PM
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
## 2020-10-26 ENCOUNTER — Inpatient Hospital Stay (HOSPITAL_COMMUNITY): Payer: Medicaid Other

## 2020-10-26 DIAGNOSIS — J9600 Acute respiratory failure, unspecified whether with hypoxia or hypercapnia: Secondary | ICD-10-CM | POA: Diagnosis not present

## 2020-10-26 DIAGNOSIS — D62 Acute posthemorrhagic anemia: Secondary | ICD-10-CM | POA: Diagnosis not present

## 2020-10-26 DIAGNOSIS — N179 Acute kidney failure, unspecified: Secondary | ICD-10-CM | POA: Diagnosis not present

## 2020-10-26 LAB — CBC
HCT: 32.5 % — ABNORMAL LOW (ref 36.0–46.0)
Hemoglobin: 10 g/dL — ABNORMAL LOW (ref 12.0–15.0)
MCH: 30.1 pg (ref 26.0–34.0)
MCHC: 30.8 g/dL (ref 30.0–36.0)
MCV: 97.9 fL (ref 80.0–100.0)
Platelets: 276 10*3/uL (ref 150–400)
RBC: 3.32 MIL/uL — ABNORMAL LOW (ref 3.87–5.11)
RDW: 17.7 % — ABNORMAL HIGH (ref 11.5–15.5)
WBC: 17.7 10*3/uL — ABNORMAL HIGH (ref 4.0–10.5)
nRBC: 0 % (ref 0.0–0.2)

## 2020-10-26 LAB — GLUCOSE, CAPILLARY
Glucose-Capillary: 114 mg/dL — ABNORMAL HIGH (ref 70–99)
Glucose-Capillary: 143 mg/dL — ABNORMAL HIGH (ref 70–99)
Glucose-Capillary: 117 mg/dL — ABNORMAL HIGH (ref 70–99)
Glucose-Capillary: 106 mg/dL — ABNORMAL HIGH (ref 70–99)
Glucose-Capillary: 113 mg/dL — ABNORMAL HIGH (ref 70–99)

## 2020-10-26 LAB — BASIC METABOLIC PANEL
Anion gap: 11 (ref 5–15)
BUN: 68 mg/dL — ABNORMAL HIGH (ref 6–20)
CO2: 15 mmol/L — ABNORMAL LOW (ref 22–32)
Calcium: 8.5 mg/dL — ABNORMAL LOW (ref 8.9–10.3)
Chloride: 114 mmol/L — ABNORMAL HIGH (ref 98–111)
Creatinine, Ser: 1.24 mg/dL — ABNORMAL HIGH (ref 0.44–1.00)
GFR, Estimated: 52 mL/min — ABNORMAL LOW (ref >60)
Glucose, Bld: 147 mg/dL — ABNORMAL HIGH (ref 70–99)
Potassium: 4 mmol/L (ref 3.5–5.1)
Sodium: 140 mmol/L (ref 135–145)
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
## 2020-10-26 NOTE — Progress Notes (Signed)
Speech at bedside working with pt with PMV. Will resume chest vest treatments at next scheduled time.

## 2020-10-26 NOTE — Progress Notes (Signed)
This nurse assessed patient for placement of PIV. This patient has very poor vasculature. At this time last IV medication was given more than 24 hours ago. D/t poor vasculature and no infusions order at this time. Recommending consulting [facility] when infusion orders change. Notified patient's nurse Gwenyth Allegra RN. Will complete consult at this time. Tomasita Morrow, RN [facility]
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
## 2020-10-27 DIAGNOSIS — D62 Acute posthemorrhagic anemia: Secondary | ICD-10-CM | POA: Diagnosis not present

## 2020-10-27 DIAGNOSIS — N179 Acute kidney failure, unspecified: Secondary | ICD-10-CM | POA: Diagnosis not present

## 2020-10-27 DIAGNOSIS — J9600 Acute respiratory failure, unspecified whether with hypoxia or hypercapnia: Secondary | ICD-10-CM | POA: Diagnosis not present

## 2020-10-27 LAB — BASIC METABOLIC PANEL
Anion gap: 10 (ref 5–15)
BUN: 67 mg/dL — ABNORMAL HIGH (ref 6–20)
CO2: 18 mmol/L — ABNORMAL LOW (ref 22–32)
Calcium: 8.5 mg/dL — ABNORMAL LOW (ref 8.9–10.3)
Chloride: 112 mmol/L — ABNORMAL HIGH (ref 98–111)
Creatinine, Ser: 1.25 mg/dL — ABNORMAL HIGH (ref 0.44–1.00)
GFR, Estimated: 52 mL/min — ABNORMAL LOW (ref >60)
Glucose, Bld: 129 mg/dL — ABNORMAL HIGH (ref 70–99)
Potassium: 3.9 mmol/L (ref 3.5–5.1)
Sodium: 140 mmol/L (ref 135–145)

## 2020-10-27 LAB — GLUCOSE, CAPILLARY
Glucose-Capillary: 104 mg/dL — ABNORMAL HIGH (ref 70–99)
Glucose-Capillary: 108 mg/dL — ABNORMAL HIGH (ref 70–99)
Glucose-Capillary: 124 mg/dL — ABNORMAL HIGH (ref 70–99)
Glucose-Capillary: 126 mg/dL — ABNORMAL HIGH (ref 70–99)
Glucose-Capillary: 138 mg/dL — ABNORMAL HIGH (ref 70–99)
Glucose-Capillary: 139 mg/dL — ABNORMAL HIGH (ref 70–99)

## 2020-10-27 LAB — CBC
HCT: 32.9 % — ABNORMAL LOW (ref 36.0–46.0)
Hemoglobin: 10.2 g/dL — ABNORMAL LOW (ref 12.0–15.0)
MCH: 30.4 pg (ref 26.0–34.0)
MCHC: 31 g/dL (ref 30.0–36.0)
MCV: 97.9 fL (ref 80.0–100.0)
Platelets: 272 10*3/uL (ref 150–400)
RBC: 3.36 MIL/uL — ABNORMAL LOW (ref 3.87–5.11)
RDW: 17.8 % — ABNORMAL HIGH (ref 11.5–15.5)
WBC: 18.8 10*3/uL — ABNORMAL HIGH (ref 4.0–10.5)
nRBC: 0 % (ref 0.0–0.2)

## 2020-10-27 MED ORDER — ORAL CARE MOUTH RINSE
15.0000 mL | Freq: Two times a day (BID) | OROMUCOSAL | Status: DC
  Administered 2020-10-27 – 2020-11-25 (×25): 15 mL via OROMUCOSAL

## 2020-10-27 MED ORDER — ORAL CARE MOUTH RINSE: 15.0000 mL | OROMUCOSAL | Status: DC

## 2020-10-27 MED ORDER — CHLORHEXIDINE GLUCONATE 0.12 % MT SOLN
15.0000 mL | Freq: Two times a day (BID) | OROMUCOSAL | Status: DC
  Administered 2020-10-27 – 2020-11-27 (×28): 15 mL via OROMUCOSAL
  Filled 2020-10-27 (×36): qty 15

## 2020-10-27 MED ORDER — ORAL CARE MOUTH RINSE
15.0000 mL | Freq: Two times a day (BID) | OROMUCOSAL | Status: DC
  Administered 2020-10-28 – 2020-11-04 (×11): 15 mL via OROMUCOSAL

## 2020-10-27 NOTE — Progress Notes (Signed)
   Sara [name] [name]  
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
## 2020-10-27 NOTE — Procedures (Signed)
Tracheostomy Change Note  Patient Details:   Name: Kelty Szafran Lefeber DOB: 03-06-1966 MRN: [id number]    Airway Documentation:     Evaluation  O2 sats: stable throughout Complications: No apparent complications Patient did tolerate procedure well. Bilateral Breath Sounds: Diminished  Trach downsized to #4 cuffless. Pt tolerated well.Marland Kitchen ETCO2 detector positive for color change. Trach secured. Guss Bunde 10/27/2020, 3:39 PM

## 2020-10-27 NOTE — Consult Note (Signed)
WOC Nurse wound follow up Saline moistened gauze dressing removed from abdominal wound. Barnetta Chapel, PA, to room to see wound.  White and black foam placed into wound bed. Left tunnel filled with a short segment of white foam.  Immediate seal obtained. Patient tolerated procedure well. Helmut Muster, RN, MSN, CWOCN, CNS-BC, pager [phone]
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
## 2020-10-28 DIAGNOSIS — J9600 Acute respiratory failure, unspecified whether with hypoxia or hypercapnia: Secondary | ICD-10-CM | POA: Diagnosis not present

## 2020-10-28 DIAGNOSIS — N179 Acute kidney failure, unspecified: Secondary | ICD-10-CM | POA: Diagnosis not present

## 2020-10-28 DIAGNOSIS — D62 Acute posthemorrhagic anemia: Secondary | ICD-10-CM | POA: Diagnosis not present

## 2020-10-28 LAB — CBC
HCT: 35 % — ABNORMAL LOW (ref 36.0–46.0)
Hemoglobin: 10.5 g/dL — ABNORMAL LOW (ref 12.0–15.0)
MCH: 29.9 pg (ref 26.0–34.0)
MCHC: 30 g/dL (ref 30.0–36.0)
MCV: 99.7 fL (ref 80.0–100.0)
Platelets: 296 10*3/uL (ref 150–400)
RBC: 3.51 MIL/uL — ABNORMAL LOW (ref 3.87–5.11)
RDW: 17.7 % — ABNORMAL HIGH (ref 11.5–15.5)
WBC: 22.1 10*3/uL — ABNORMAL HIGH (ref 4.0–10.5)
nRBC: 0 % (ref 0.0–0.2)

## 2020-10-28 LAB — GLUCOSE, CAPILLARY
Glucose-Capillary: 100 mg/dL — ABNORMAL HIGH (ref 70–99)
Glucose-Capillary: 101 mg/dL — ABNORMAL HIGH (ref 70–99)
Glucose-Capillary: 102 mg/dL — ABNORMAL HIGH (ref 70–99)
Glucose-Capillary: 121 mg/dL — ABNORMAL HIGH (ref 70–99)
Glucose-Capillary: 122 mg/dL — ABNORMAL HIGH (ref 70–99)
Glucose-Capillary: 124 mg/dL — ABNORMAL HIGH (ref 70–99)
Glucose-Capillary: 79 mg/dL (ref 70–99)

## 2020-10-28 LAB — BASIC METABOLIC PANEL
Anion gap: 13 (ref 5–15)
BUN: 66 mg/dL — ABNORMAL HIGH (ref 6–20)
CO2: 15 mmol/L — ABNORMAL LOW (ref 22–32)
Calcium: 8.7 mg/dL — ABNORMAL LOW (ref 8.9–10.3)
Chloride: 109 mmol/L (ref 98–111)
Creatinine, Ser: 1.1 mg/dL — ABNORMAL HIGH (ref 0.44–1.00)
GFR, Estimated: >60 mL/min (ref >60)
Glucose, Bld: 114 mg/dL — ABNORMAL HIGH (ref 70–99)
Potassium: 4.1 mmol/L (ref 3.5–5.1)
Sodium: 137 mmol/L (ref 135–145)

## 2020-10-28 MED ORDER — LACTATED RINGERS IV BOLUS
1000.0000 mL | Freq: Once | INTRAVENOUS | Status: AC
  Administered 2020-10-28: 1000 mL via INTRAVENOUS
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
## 2020-10-28 NOTE — Progress Notes (Signed)
Found pt with trach partially displaced. Pt Sats 99 and stable.  Dr. Dwain Sarna was contacted, he said to leave pt in current state and he will check on the pt soon.

## 2020-10-28 NOTE — Progress Notes (Signed)
Notified by RRT that pt was found with trach partially displaced. spo2 99% and no s/sx of distress evident at this time. Dr. Dwain Sarna called at [phone] and received instructions to leave pt in current state and he will assess. MD arrives at bedside at 0630 and attempts to re-insert trach x4 before removing outter cannula completely at [phone]. No further orders received.
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
## 2020-10-29 DIAGNOSIS — D62 Acute posthemorrhagic anemia: Secondary | ICD-10-CM | POA: Diagnosis not present

## 2020-10-29 DIAGNOSIS — N179 Acute kidney failure, unspecified: Secondary | ICD-10-CM | POA: Diagnosis not present

## 2020-10-29 DIAGNOSIS — J9600 Acute respiratory failure, unspecified whether with hypoxia or hypercapnia: Secondary | ICD-10-CM | POA: Diagnosis not present

## 2020-10-29 LAB — CBC
HCT: 40.7 % (ref 36.0–46.0)
Hemoglobin: 12.6 g/dL (ref 12.0–15.0)
MCH: 30.4 pg (ref 26.0–34.0)
MCHC: 31 g/dL (ref 30.0–36.0)
MCV: 98.3 fL (ref 80.0–100.0)
Platelets: 273 10*3/uL (ref 150–400)
RBC: 4.14 MIL/uL (ref 3.87–5.11)
RDW: 18.1 % — ABNORMAL HIGH (ref 11.5–15.5)
WBC: 19.2 10*3/uL — ABNORMAL HIGH (ref 4.0–10.5)
nRBC: 0 % (ref 0.0–0.2)

## 2020-10-29 LAB — BASIC METABOLIC PANEL
Anion gap: 12 (ref 5–15)
BUN: 61 mg/dL — ABNORMAL HIGH (ref 6–20)
CO2: 15 mmol/L — ABNORMAL LOW (ref 22–32)
Calcium: 8.8 mg/dL — ABNORMAL LOW (ref 8.9–10.3)
Chloride: 111 mmol/L (ref 98–111)
Creatinine, Ser: 1.05 mg/dL — ABNORMAL HIGH (ref 0.44–1.00)
GFR, Estimated: >60 mL/min (ref >60)
Glucose, Bld: 131 mg/dL — ABNORMAL HIGH (ref 70–99)
Potassium: 3.9 mmol/L (ref 3.5–5.1)
Sodium: 138 mmol/L (ref 135–145)

## 2020-10-29 LAB — GLUCOSE, CAPILLARY
Glucose-Capillary: 102 mg/dL — ABNORMAL HIGH (ref 70–99)
Glucose-Capillary: 125 mg/dL — ABNORMAL HIGH (ref 70–99)
Glucose-Capillary: 92 mg/dL (ref 70–99)
Glucose-Capillary: 123 mg/dL — ABNORMAL HIGH (ref 70–99)
Glucose-Capillary: 99 mg/dL (ref 70–99)

## 2020-10-29 LAB — MAGNESIUM: Magnesium: 1.8 mg/dL (ref 1.7–2.4)

## 2020-10-29 LAB — PHOSPHORUS: Phosphorus: 4.9 mg/dL — ABNORMAL HIGH (ref 2.5–4.6)
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
## 2020-10-30 DIAGNOSIS — J90 Pleural effusion, not elsewhere classified: Secondary | ICD-10-CM | POA: Diagnosis not present

## 2020-10-30 DIAGNOSIS — D62 Acute posthemorrhagic anemia: Secondary | ICD-10-CM | POA: Diagnosis not present

## 2020-10-30 DIAGNOSIS — N179 Acute kidney failure, unspecified: Secondary | ICD-10-CM | POA: Diagnosis not present

## 2020-10-30 DIAGNOSIS — Z419 Encounter for procedure for purposes other than remedying health state, unspecified: Secondary | ICD-10-CM | POA: Diagnosis not present

## 2020-10-30 LAB — GLUCOSE, CAPILLARY
Glucose-Capillary: 113 mg/dL — ABNORMAL HIGH (ref 70–99)
Glucose-Capillary: 114 mg/dL — ABNORMAL HIGH (ref 70–99)
Glucose-Capillary: 114 mg/dL — ABNORMAL HIGH (ref 70–99)
Glucose-Capillary: 123 mg/dL — ABNORMAL HIGH (ref 70–99)
Glucose-Capillary: 127 mg/dL — ABNORMAL HIGH (ref 70–99)
Glucose-Capillary: 129 mg/dL — ABNORMAL HIGH (ref 70–99)
Glucose-Capillary: 131 mg/dL — ABNORMAL HIGH (ref 70–99)

## 2020-10-30 LAB — BASIC METABOLIC PANEL
Anion gap: 10 (ref 5–15)
BUN: 65 mg/dL — ABNORMAL HIGH (ref 6–20)
CO2: 17 mmol/L — ABNORMAL LOW (ref 22–32)
Calcium: 9 mg/dL (ref 8.9–10.3)
Chloride: 110 mmol/L (ref 98–111)
Creatinine, Ser: 1.03 mg/dL — ABNORMAL HIGH (ref 0.44–1.00)
GFR, Estimated: >60 mL/min (ref >60)
Glucose, Bld: 139 mg/dL — ABNORMAL HIGH (ref 70–99)
Potassium: 3.8 mmol/L (ref 3.5–5.1)
Sodium: 137 mmol/L (ref 135–145)

## 2020-10-30 LAB — MAGNESIUM: Magnesium: 1.8 mg/dL (ref 1.7–2.4)

## 2020-10-30 LAB — CBC
HCT: 34.4 % — ABNORMAL LOW (ref 36.0–46.0)
Hemoglobin: 10.6 g/dL — ABNORMAL LOW (ref 12.0–15.0)
MCH: 30.6 pg (ref 26.0–34.0)
MCHC: 30.8 g/dL (ref 30.0–36.0)
MCV: 99.4 fL (ref 80.0–100.0)
Platelets: 323 10*3/uL (ref 150–400)
RBC: 3.46 MIL/uL — ABNORMAL LOW (ref 3.87–5.11)
RDW: 17.9 % — ABNORMAL HIGH (ref 11.5–15.5)
WBC: 21.8 10*3/uL — ABNORMAL HIGH (ref 4.0–10.5)
nRBC: 0 % (ref 0.0–0.2)

## 2020-10-30 LAB — PHOSPHORUS: Phosphorus: 4.8 mg/dL — ABNORMAL HIGH (ref 2.5–4.6)

## 2020-10-30 MED ORDER — OXYCODONE HCL 5 MG/5ML PO SOLN: 2.5000 mg | ORAL | Status: DC | PRN

## 2020-10-30 MED ORDER — FREE WATER
500.0000 mL | Freq: Four times a day (QID) | Status: DC
  Administered 2020-10-30 – 2020-10-31 (×2): 500 mL
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
## 2020-10-30 NOTE — Progress Notes (Signed)
SLP Cancellation Note  Patient Details Name: CYBILL URIEGAS MRN: [id number] DOB: 21-May-1966   Cancelled treatment:       Reason Eval/Treat Not Completed: Patient declined, no reason specified.  Attempted BSE, however pt refused on this date.  SLP will continue to f/u as appropriate.     Shanon Rosser Dillinger Aston 10/30/2020, 10:56 AM
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
## 2020-10-30 NOTE — Consult Note (Signed)
WOC Nurse ostomy follow up Patient receiving care in [facility] 4N14 Stoma type/location: LUQ colostomy Stomal assessment/size: 1 1/4 inches Output thin brown Replaced existing pouch with 2 pc high output with barrier ring, connected to bedside drainage bag. Additional pouches in patient room, placed on top shelf with vac supplies.  Please order more supplies if used prior to [facility] nurse follow-up M-W-F.  Renaldo Reel Katrinka Blazing, MSN, RN, CMSRN, Angus Seller, [facility] Wound Treatment Associate Pager [phone]

## 2020-10-30 NOTE — Consult Note (Signed)
WOC Nurse Consult Note: Patient receiving care in [facility] 4NP14. PA at bedside to see abdominal wound. All pieces of white and black foam removed from abdominal wound.  Tunnel on right side measures at 5 cm.  Tunnel on left side measures 11 cm.  Wound beefy red granulation tissue.  One white foam strip placed into R & L tunnels, and main wound bed. Main wound bed then topped with black foam. Connected to suction.  Immediate seal obtained.  WOC Nurse ostomy follow up There were no ostomy supplies in the room.  I learned from the PA at the bedside the patient has had a lot of ostomy output.  I have requested high output pouches that can be placed to a bedside drainage bag.  They must be obtained from materials.  I learned from the primary RN that the patient pulled off her ostomy pouches over the weekend; this helps explain why there were no supplies in the room.  More have been requested. Helmut Muster, RN, MSN, CWOCN, CNS-BC, pager [phone]

## 2020-10-31 ENCOUNTER — Inpatient Hospital Stay (HOSPITAL_COMMUNITY): Payer: Medicaid Other

## 2020-10-31 DIAGNOSIS — N179 Acute kidney failure, unspecified: Secondary | ICD-10-CM | POA: Diagnosis not present

## 2020-10-31 DIAGNOSIS — J90 Pleural effusion, not elsewhere classified: Secondary | ICD-10-CM | POA: Diagnosis not present

## 2020-10-31 DIAGNOSIS — D62 Acute posthemorrhagic anemia: Secondary | ICD-10-CM | POA: Diagnosis not present

## 2020-10-31 LAB — GLUCOSE, CAPILLARY
Glucose-Capillary: 98 mg/dL (ref 70–99)
Glucose-Capillary: 139 mg/dL — ABNORMAL HIGH (ref 70–99)
Glucose-Capillary: 127 mg/dL — ABNORMAL HIGH (ref 70–99)
Glucose-Capillary: 101 mg/dL — ABNORMAL HIGH (ref 70–99)
Glucose-Capillary: 95 mg/dL (ref 70–99)

## 2020-10-31 LAB — CBC
HCT: 31.6 % — ABNORMAL LOW (ref 36.0–46.0)
Hemoglobin: 9.5 g/dL — ABNORMAL LOW (ref 12.0–15.0)
MCH: 29.5 pg (ref 26.0–34.0)
MCHC: 30.1 g/dL (ref 30.0–36.0)
MCV: 98.1 fL (ref 80.0–100.0)
Platelets: 317 10*3/uL (ref 150–400)
RBC: 3.22 MIL/uL — ABNORMAL LOW (ref 3.87–5.11)
RDW: 17.8 % — ABNORMAL HIGH (ref 11.5–15.5)
WBC: 21.2 10*3/uL — ABNORMAL HIGH (ref 4.0–10.5)
nRBC: 0.1 % (ref 0.0–0.2)

## 2020-10-31 LAB — BASIC METABOLIC PANEL
Anion gap: 11 (ref 5–15)
BUN: 71 mg/dL — ABNORMAL HIGH (ref 6–20)
CO2: 19 mmol/L — ABNORMAL LOW (ref 22–32)
Calcium: 9.4 mg/dL (ref 8.9–10.3)
Chloride: 112 mmol/L — ABNORMAL HIGH (ref 98–111)
Creatinine, Ser: 1.07 mg/dL — ABNORMAL HIGH (ref 0.44–1.00)
GFR, Estimated: >60 mL/min (ref >60)
Glucose, Bld: 138 mg/dL — ABNORMAL HIGH (ref 70–99)
Potassium: 3.9 mmol/L (ref 3.5–5.1)
Sodium: 142 mmol/L (ref 135–145)

## 2020-10-31 LAB — MAGNESIUM: Magnesium: 1.9 mg/dL (ref 1.7–2.4)

## 2020-10-31 LAB — PHOSPHORUS: Phosphorus: 4.7 mg/dL — ABNORMAL HIGH (ref 2.5–4.6)

## 2020-10-31 MED ORDER — SODIUM CHLORIDE 0.9 % IV SOLN
2.0000 g | Freq: Three times a day (TID) | INTRAVENOUS | Status: AC
  Administered 2020-10-31 – 2020-11-07 (×21): 2 g via INTRAVENOUS
  Filled 2020-10-31 (×21): qty 2

## 2020-10-31 MED ORDER — METHOCARBAMOL 500 MG PO TABS: 500.0000 mg | ORAL_TABLET | Freq: Three times a day (TID) | ORAL | Status: DC | PRN

## 2020-10-31 MED ORDER — GUAIFENESIN 100 MG/5ML PO SOLN: 15.0000 mL | Freq: Four times a day (QID) | ORAL | Status: DC | PRN

## 2020-10-31 MED ORDER — SODIUM CHLORIDE 0.9 % IV BOLUS
500.0000 mL | Freq: Once | INTRAVENOUS | Status: AC
  Administered 2020-10-31: 500 mL via INTRAVENOUS

## 2020-10-31 MED ORDER — FREE WATER
500.0000 mL | Status: DC
  Administered 2020-10-31 – 2020-11-01 (×5): 500 mL

## 2020-10-31 MED ORDER — FUROSEMIDE 10 MG/ML IJ SOLN
40.0000 mg | Freq: Once | INTRAMUSCULAR | Status: AC
  Administered 2020-10-31: 40 mg via INTRAVENOUS
  Filled 2020-10-31: qty 4

## 2020-10-31 NOTE — Progress Notes (Signed)
Patient is labored breathing/dyspnea at rest and tachypneic. Stated she doesn't feel good. Called David the rapid response RN and he came in and assessed but no recommendation at this time.  I have already paged Dr. Andrey Campanile the on call provider and awaiting a call back. Will continue to monitor.
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
## 2020-10-31 NOTE — Progress Notes (Signed)
Patient requested to turned off feedings post FEES.  RN ordered dinner for patient. Patient is tolerating ice chips and water well. Will resume feedings later tonight.
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
## 2020-10-31 NOTE — Progress Notes (Signed)
Paged Dr. Andrey Campanile and he called back with a new order for potable CXR. Will place the order and continue to monitor.

## 2020-11-01 DIAGNOSIS — D62 Acute posthemorrhagic anemia: Secondary | ICD-10-CM | POA: Diagnosis not present

## 2020-11-01 DIAGNOSIS — J90 Pleural effusion, not elsewhere classified: Secondary | ICD-10-CM | POA: Diagnosis not present

## 2020-11-01 DIAGNOSIS — N179 Acute kidney failure, unspecified: Secondary | ICD-10-CM | POA: Diagnosis not present

## 2020-11-01 LAB — CBC
HCT: 28.3 % — ABNORMAL LOW (ref 36.0–46.0)
Hemoglobin: 8.6 g/dL — ABNORMAL LOW (ref 12.0–15.0)
MCH: 30.2 pg (ref 26.0–34.0)
MCHC: 30.4 g/dL (ref 30.0–36.0)
MCV: 99.3 fL (ref 80.0–100.0)
Platelets: 345 10*3/uL (ref 150–400)
RBC: 2.85 MIL/uL — ABNORMAL LOW (ref 3.87–5.11)
RDW: 17.8 % — ABNORMAL HIGH (ref 11.5–15.5)
WBC: 19 10*3/uL — ABNORMAL HIGH (ref 4.0–10.5)
nRBC: 0 % (ref 0.0–0.2)

## 2020-11-01 LAB — BASIC METABOLIC PANEL
Anion gap: 12 (ref 5–15)
BUN: 64 mg/dL — ABNORMAL HIGH (ref 6–20)
CO2: 16 mmol/L — ABNORMAL LOW (ref 22–32)
Calcium: 8.8 mg/dL — ABNORMAL LOW (ref 8.9–10.3)
Chloride: 108 mmol/L (ref 98–111)
Creatinine, Ser: 1.04 mg/dL — ABNORMAL HIGH (ref 0.44–1.00)
GFR, Estimated: >60 mL/min (ref >60)
Glucose, Bld: 148 mg/dL — ABNORMAL HIGH (ref 70–99)
Potassium: 3.6 mmol/L (ref 3.5–5.1)
Sodium: 136 mmol/L (ref 135–145)

## 2020-11-01 LAB — GLUCOSE, CAPILLARY
Glucose-Capillary: 113 mg/dL — ABNORMAL HIGH (ref 70–99)
Glucose-Capillary: 129 mg/dL — ABNORMAL HIGH (ref 70–99)
Glucose-Capillary: 140 mg/dL — ABNORMAL HIGH (ref 70–99)
Glucose-Capillary: 81 mg/dL (ref 70–99)
Glucose-Capillary: 95 mg/dL (ref 70–99)
Glucose-Capillary: 97 mg/dL (ref 70–99)
Glucose-Capillary: 99 mg/dL (ref 70–99)

## 2020-11-01 MED ORDER — PROSOURCE PLUS PO LIQD
30.0000 mL | Freq: Three times a day (TID) | ORAL | Status: DC
  Administered 2020-11-01 – 2020-11-03 (×5): 30 mL via ORAL
  Filled 2020-11-01 (×4): qty 30

## 2020-11-01 MED ORDER — ERGOCALCIFEROL 200 MCG/ML PO SOLN
4000.0000 [IU] | Freq: Every day | ORAL | Status: DC
  Filled 2020-11-01: qty 0.5

## 2020-11-01 MED ORDER — POTASSIUM CHLORIDE 20 MEQ PO PACK
40.0000 meq | PACK | Freq: Once | ORAL | Status: AC
  Administered 2020-11-01: 40 meq via ORAL
  Filled 2020-11-01: qty 2

## 2020-11-01 MED ORDER — BUSPIRONE HCL 5 MG PO TABS
10.0000 mg | ORAL_TABLET | Freq: Three times a day (TID) | ORAL | Status: DC
  Administered 2020-11-01 – 2020-11-10 (×25): 10 mg via ORAL
  Filled 2020-11-01 (×26): qty 2

## 2020-11-01 MED ORDER — ENSURE ENLIVE PO LIQD
237.0000 mL | Freq: Two times a day (BID) | ORAL | Status: DC
  Administered 2020-11-01: 237 mL via ORAL

## 2020-11-01 MED ORDER — ACETAMINOPHEN 325 MG PO TABS
650.0000 mg | ORAL_TABLET | Freq: Four times a day (QID) | ORAL | Status: DC | PRN
  Administered 2020-11-04: 650 mg via ORAL
  Filled 2020-11-01: qty 2

## 2020-11-01 MED ORDER — ZINC SULFATE 220 (50 ZN) MG PO CAPS
220.0000 mg | ORAL_CAPSULE | Freq: Every day | ORAL | Status: DC
  Administered 2020-11-01 – 2020-11-10 (×7): 220 mg via ORAL
  Filled 2020-11-01 (×11): qty 1

## 2020-11-01 MED ORDER — GUAIFENESIN 100 MG/5ML PO SOLN: 15.0000 mL | Freq: Four times a day (QID) | ORAL | Status: DC | PRN

## 2020-11-01 MED ORDER — ASCORBIC ACID 500 MG PO TABS
500.0000 mg | ORAL_TABLET | Freq: Two times a day (BID) | ORAL | Status: DC
  Administered 2020-11-01 – 2020-11-10 (×17): 500 mg via ORAL
  Filled 2020-11-01 (×18): qty 1

## 2020-11-01 MED ORDER — OXYCODONE HCL 5 MG PO TABS
2.5000 mg | ORAL_TABLET | ORAL | Status: DC | PRN
  Administered 2020-11-03 – 2020-11-10 (×8): 5 mg via ORAL
  Filled 2020-11-01 (×9): qty 1

## 2020-11-01 MED ORDER — ENSURE ENLIVE PO LIQD
237.0000 mL | Freq: Four times a day (QID) | ORAL | Status: DC
  Administered 2020-11-01 – 2020-11-13 (×12): 237 mL via ORAL

## 2020-11-01 MED ORDER — PANTOPRAZOLE SODIUM 40 MG PO TBEC
40.0000 mg | DELAYED_RELEASE_TABLET | Freq: Every day | ORAL | Status: DC
  Administered 2020-11-01 – 2020-11-07 (×4): 40 mg via ORAL
  Filled 2020-11-01 (×6): qty 1

## 2020-11-01 MED ORDER — VITAMIN D 25 MCG (1000 UNIT) PO TABS
4000.0000 [IU] | ORAL_TABLET | Freq: Every day | ORAL | Status: DC
  Administered 2020-11-01 – 2020-11-10 (×7): 4000 [IU] via ORAL
  Filled 2020-11-01 (×9): qty 4

## 2020-11-01 MED ORDER — METHOCARBAMOL 500 MG PO TABS
500.0000 mg | ORAL_TABLET | Freq: Three times a day (TID) | ORAL | Status: DC | PRN
  Administered 2020-11-04 – 2020-11-10 (×5): 500 mg via ORAL
  Filled 2020-11-01 (×6): qty 1
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
## 2020-11-01 NOTE — TOC Progression Note (Signed)
Transition of Care [facility]) - Progression Note    Patient Details  Name: SHEMEKIA PATANE MRN: [id number] Date of Birth: [birth year]  Transition of [facility]) CM/SW Contact  Glennon Mac, RN Phone Number: 11/01/2020, 3:16 PM  Clinical Narrative:  Pt now has passed for a diet; completed FL2 and faxed out for SNF bed search.  Will provide updates as available.      Expected Discharge Plan: Skilled Nursing Facility Barriers to Discharge: Continued Medical Work up  Expected Discharge Plan and Services Expected Discharge Plan: Skilled Nursing Facility   Discharge Planning Services: CM Consult   Living arrangements for the past 2 months: Single Family Home                                       Social Determinants of Health (SDOH) Interventions    Readmission Risk Interventions No flowsheet data found.  Quintella Baton, RN, BSN  Trauma/Neuro ICU Case Manager [phone]
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
## 2020-11-01 NOTE — Progress Notes (Signed)
Patient  requested for Vicks cream for her legs. Notified. Dr.  Corliss Skains on call. Dr. Corliss Skains was so rude on the phone saying, "I'm not on call to be woken up at this time  for something like this." He added to I should tell the rounding PA in the morning to prescribe that. Patient refused oral pain medication, all she wants is something to sooth her feet. Will soak her feet in water and see if that will help.
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
## 2020-11-01 NOTE — Consult Note (Signed)
WOC Nurse wound follow up Patient receiving care in [facility] 4N14.  PA to bedside for wound evaluation. Wound type: Abdominal surgical Measurement:  14 cm x 10.8 cm x 2.8 cm. Tunnel at lower right side measures 8 cm today.  Left lower tunnel measures 9 cm. Wound bed: beefy red Drainage (amount, consistency, odor) serosanginous Periwound: intact Dressing procedure/placement/frequency: all pieces of white foam and black foam removed from wound bed and tunnels.  A narrow piece of white foam placed into each tunnel.  A piece of black foam placed over the white foam.  Drape applied; immediate seal obtained.  Patient tolerated very well.  WOC Nurse ostomy follow up High output pouch connected to bedside suction, effluent is thin and green.  No signs of impending pouch leakage, therefore, pouch not changed.  Continue use of this pouching system.  More VAC and pouching supplies requested by [place]. Helmut Muster, RN, MSN, CWOCN, CNS-BC, pager [phone]

## 2020-11-02 ENCOUNTER — Inpatient Hospital Stay (HOSPITAL_COMMUNITY): Payer: Medicaid Other | Admitting: Certified Registered Nurse Anesthetist

## 2020-11-02 ENCOUNTER — Encounter (HOSPITAL_COMMUNITY): Admission: EM | Disposition: A | Discharge status: Rehabilitation Facility | Payer: Self-pay | Source: Home / Self Care

## 2020-11-02 ENCOUNTER — Encounter (HOSPITAL_COMMUNITY): Payer: Self-pay

## 2020-11-02 ENCOUNTER — Inpatient Hospital Stay (HOSPITAL_COMMUNITY): Payer: Medicaid Other

## 2020-11-02 DIAGNOSIS — D689 Coagulation defect, unspecified: Secondary | ICD-10-CM | POA: Diagnosis not present

## 2020-11-02 DIAGNOSIS — M86152 Other acute osteomyelitis, left femur: Secondary | ICD-10-CM | POA: Diagnosis not present

## 2020-11-02 DIAGNOSIS — Z20822 Contact with and (suspected) exposure to covid-19: Secondary | ICD-10-CM | POA: Diagnosis not present

## 2020-11-02 DIAGNOSIS — S2243XA Multiple fractures of ribs, bilateral, initial encounter for closed fracture: Secondary | ICD-10-CM | POA: Diagnosis not present

## 2020-11-02 DIAGNOSIS — D62 Acute posthemorrhagic anemia: Secondary | ICD-10-CM | POA: Diagnosis not present

## 2020-11-02 DIAGNOSIS — J869 Pyothorax without fistula: Secondary | ICD-10-CM | POA: Diagnosis not present

## 2020-11-02 DIAGNOSIS — S52501A Unspecified fracture of the lower end of right radius, initial encounter for closed fracture: Secondary | ICD-10-CM | POA: Diagnosis not present

## 2020-11-02 DIAGNOSIS — S72462C Displaced supracondylar fracture with intracondylar extension of lower end of left femur, initial encounter for open fracture type IIIA, IIIB, or IIIC: Secondary | ICD-10-CM | POA: Diagnosis not present

## 2020-11-02 DIAGNOSIS — N179 Acute kidney failure, unspecified: Secondary | ICD-10-CM | POA: Diagnosis not present

## 2020-11-02 DIAGNOSIS — Z23 Encounter for immunization: Secondary | ICD-10-CM | POA: Diagnosis not present

## 2020-11-02 DIAGNOSIS — K659 Peritonitis, unspecified: Secondary | ICD-10-CM | POA: Diagnosis not present

## 2020-11-02 DIAGNOSIS — J80 Acute respiratory distress syndrome: Secondary | ICD-10-CM | POA: Diagnosis not present

## 2020-11-02 DIAGNOSIS — S72002K Fracture of unspecified part of neck of left femur, subsequent encounter for closed fracture with nonunion: Secondary | ICD-10-CM | POA: Diagnosis not present

## 2020-11-02 DIAGNOSIS — S72452C Displaced supracondylar fracture without intracondylar extension of lower end of left femur, initial encounter for open fracture type IIIA, IIIB, or IIIC: Secondary | ICD-10-CM | POA: Diagnosis not present

## 2020-11-02 DIAGNOSIS — S36892A Contusion of other intra-abdominal organs, initial encounter: Secondary | ICD-10-CM | POA: Diagnosis not present

## 2020-11-02 DIAGNOSIS — S82142A Displaced bicondylar fracture of left tibia, initial encounter for closed fracture: Secondary | ICD-10-CM | POA: Diagnosis not present

## 2020-11-02 DIAGNOSIS — J90 Pleural effusion, not elsewhere classified: Secondary | ICD-10-CM | POA: Diagnosis not present

## 2020-11-02 HISTORY — PX: ORIF FEMUR FRACTURE: SHX2119

## 2020-11-02 LAB — COMPREHENSIVE METABOLIC PANEL
ALT: 53 U/L — ABNORMAL HIGH (ref 0–44)
AST: 32 U/L (ref 15–41)
Albumin: 1.1 g/dL — ABNORMAL LOW (ref 3.5–5.0)
Alkaline Phosphatase: 119 U/L (ref 38–126)
Anion gap: 11 (ref 5–15)
BUN: 59 mg/dL — ABNORMAL HIGH (ref 6–20)
CO2: 17 mmol/L — ABNORMAL LOW (ref 22–32)
Calcium: 9.6 mg/dL (ref 8.9–10.3)
Chloride: 108 mmol/L (ref 98–111)
Creatinine, Ser: 1.09 mg/dL — ABNORMAL HIGH (ref 0.44–1.00)
GFR, Estimated: >60 mL/min (ref >60)
Glucose, Bld: 99 mg/dL (ref 70–99)
Potassium: 4.1 mmol/L (ref 3.5–5.1)
Sodium: 136 mmol/L (ref 135–145)
Total Bilirubin: 0.8 mg/dL (ref 0.3–1.2)
Total Protein: 5.7 g/dL — ABNORMAL LOW (ref 6.5–8.1)

## 2020-11-02 LAB — CBC
HCT: 28.8 % — ABNORMAL LOW (ref 36.0–46.0)
Hemoglobin: 8.8 g/dL — ABNORMAL LOW (ref 12.0–15.0)
MCH: 29.8 pg (ref 26.0–34.0)
MCHC: 30.6 g/dL (ref 30.0–36.0)
MCV: 97.6 fL (ref 80.0–100.0)
Platelets: 377 10*3/uL (ref 150–400)
RBC: 2.95 MIL/uL — ABNORMAL LOW (ref 3.87–5.11)
RDW: 17.4 % — ABNORMAL HIGH (ref 11.5–15.5)
WBC: 19.5 10*3/uL — ABNORMAL HIGH (ref 4.0–10.5)
nRBC: 0 % (ref 0.0–0.2)

## 2020-11-02 LAB — TYPE AND SCREEN
ABO/RH(D): O POS
Antibody Screen: NEGATIVE

## 2020-11-02 LAB — PROTIME-INR
INR: 1.2 (ref 0.8–1.2)
Prothrombin Time: 14.9 seconds (ref 11.4–15.2)

## 2020-11-02 LAB — GLUCOSE, CAPILLARY
Glucose-Capillary: 95 mg/dL (ref 70–99)
Glucose-Capillary: 96 mg/dL (ref 70–99)
Glucose-Capillary: 97 mg/dL (ref 70–99)
Glucose-Capillary: 86 mg/dL (ref 70–99)
Glucose-Capillary: 143 mg/dL — ABNORMAL HIGH (ref 70–99)

## 2020-11-02 SURGERY — OPEN REDUCTION INTERNAL FIXATION (ORIF) DISTAL FEMUR FRACTURE
Anesthesia: General | Site: Leg Upper | Laterality: Left

## 2020-11-02 MED ORDER — MIDAZOLAM HCL 2 MG/2ML IJ SOLN
INTRAMUSCULAR | Status: AC
  Filled 2020-11-02: qty 2

## 2020-11-02 MED ORDER — 0.9 % SODIUM CHLORIDE (POUR BTL) OPTIME
TOPICAL | Status: DC | PRN
  Administered 2020-11-02: 6000 mL

## 2020-11-02 MED ORDER — FENTANYL CITRATE (PF) 250 MCG/5ML IJ SOLN
INTRAMUSCULAR | Status: AC
  Filled 2020-11-02: qty 5

## 2020-11-02 MED ORDER — VANCOMYCIN HCL 10 G IV SOLR
2250.0000 mg | Freq: Once | INTRAVENOUS | Status: AC
  Administered 2020-11-02: 2250 mg via INTRAVENOUS
  Filled 2020-11-02: qty 2250

## 2020-11-02 MED ORDER — SUGAMMADEX SODIUM 200 MG/2ML IV SOLN
INTRAVENOUS | Status: DC | PRN
  Administered 2020-11-02: 200 mg via INTRAVENOUS

## 2020-11-02 MED ORDER — DEXAMETHASONE SODIUM PHOSPHATE 10 MG/ML IJ SOLN
INTRAMUSCULAR | Status: DC | PRN
  Administered 2020-11-02: 8 mg via INTRAVENOUS

## 2020-11-02 MED ORDER — FENTANYL CITRATE (PF) 250 MCG/5ML IJ SOLN
INTRAMUSCULAR | Status: DC | PRN
  Administered 2020-11-02: 50 ug via INTRAVENOUS

## 2020-11-02 MED ORDER — PHENYLEPHRINE HCL-NACL 10-0.9 MG/250ML-% IV SOLN
INTRAVENOUS | Status: DC | PRN
  Administered 2020-11-02: 100 ug/min via INTRAVENOUS

## 2020-11-02 MED ORDER — PROPOFOL 10 MG/ML IV BOLUS
INTRAVENOUS | Status: DC | PRN
  Administered 2020-11-02: 120 mg via INTRAVENOUS

## 2020-11-02 MED ORDER — ONDANSETRON HCL 4 MG/2ML IJ SOLN
INTRAMUSCULAR | Status: AC
  Filled 2020-11-02: qty 2

## 2020-11-02 MED ORDER — LIDOCAINE 2% (20 MG/ML) 5 ML SYRINGE
INTRAMUSCULAR | Status: AC
  Filled 2020-11-02: qty 5

## 2020-11-02 MED ORDER — PHENYLEPHRINE 40 MCG/ML (10ML) SYRINGE FOR IV PUSH (FOR BLOOD PRESSURE SUPPORT)
PREFILLED_SYRINGE | INTRAVENOUS | Status: AC
  Filled 2020-11-02: qty 10

## 2020-11-02 MED ORDER — CHLORHEXIDINE GLUCONATE 0.12 % MT SOLN
OROMUCOSAL | Status: AC
  Administered 2020-11-02: 15 mL via OROMUCOSAL
  Filled 2020-11-02: qty 15

## 2020-11-02 MED ORDER — VANCOMYCIN HCL 1000 MG IV SOLR
INTRAVENOUS | Status: AC
  Filled 2020-11-02: qty 1000

## 2020-11-02 MED ORDER — PROPOFOL 10 MG/ML IV BOLUS
INTRAVENOUS | Status: AC
  Filled 2020-11-02: qty 20

## 2020-11-02 MED ORDER — VANCOMYCIN HCL 750 MG/150ML IV SOLN
750.0000 mg | Freq: Two times a day (BID) | INTRAVENOUS | Status: DC
  Administered 2020-11-03 – 2020-11-07 (×9): 750 mg via INTRAVENOUS
  Filled 2020-11-02 (×11): qty 150

## 2020-11-02 MED ORDER — DEXAMETHASONE SODIUM PHOSPHATE 10 MG/ML IJ SOLN
INTRAMUSCULAR | Status: AC
  Filled 2020-11-02: qty 1

## 2020-11-02 MED ORDER — CETAPHIL MOISTURIZING EX LOTN
TOPICAL_LOTION | CUTANEOUS | Status: DC | PRN
  Filled 2020-11-02: qty 473

## 2020-11-02 MED ORDER — LIDOCAINE 2% (20 MG/ML) 5 ML SYRINGE
INTRAMUSCULAR | Status: DC | PRN
  Administered 2020-11-02: 100 mg via INTRAVENOUS

## 2020-11-02 MED ORDER — CHLORHEXIDINE GLUCONATE 0.12 % MT SOLN
15.0000 mL | OROMUCOSAL | Status: AC
  Filled 2020-11-02: qty 15

## 2020-11-02 MED ORDER — ROCURONIUM BROMIDE 10 MG/ML (PF) SYRINGE
PREFILLED_SYRINGE | INTRAVENOUS | Status: DC | PRN
  Administered 2020-11-02: 20 mg via INTRAVENOUS
  Administered 2020-11-02: 50 mg via INTRAVENOUS
  Administered 2020-11-02: 30 mg via INTRAVENOUS

## 2020-11-02 MED ORDER — PHENYLEPHRINE HCL (PRESSORS) 10 MG/ML IV SOLN
INTRAVENOUS | Status: DC | PRN
  Administered 2020-11-02: 40 ug via INTRAVENOUS
  Administered 2020-11-02 (×3): 80 ug via INTRAVENOUS
  Administered 2020-11-02: 40 ug via INTRAVENOUS
  Administered 2020-11-02: 80 ug via INTRAVENOUS

## 2020-11-02 MED ORDER — CEFAZOLIN SODIUM-DEXTROSE 2-3 GM-%(50ML) IV SOLR
INTRAVENOUS | Status: DC | PRN
  Administered 2020-11-02: 2 g via INTRAVENOUS

## 2020-11-02 MED ORDER — ONDANSETRON HCL 4 MG/2ML IJ SOLN
INTRAMUSCULAR | Status: DC | PRN
  Administered 2020-11-02: 4 mg via INTRAVENOUS

## 2020-11-02 MED ORDER — MIDAZOLAM HCL 2 MG/2ML IJ SOLN
INTRAMUSCULAR | Status: DC | PRN
  Administered 2020-11-02 (×2): 1 mg via INTRAVENOUS

## 2020-11-02 MED ORDER — ROCURONIUM BROMIDE 10 MG/ML (PF) SYRINGE
PREFILLED_SYRINGE | INTRAVENOUS | Status: AC
  Filled 2020-11-02: qty 10

## 2020-11-02 MED ORDER — LACTATED RINGERS IV SOLN: INTRAVENOUS | Status: DC

## 2020-11-02 MED ORDER — CEFAZOLIN SODIUM-DEXTROSE 2-4 GM/100ML-% IV SOLN
INTRAVENOUS | Status: AC
  Filled 2020-11-02: qty 100

## 2020-11-02 SURGICAL SUPPLY — 67 items
BLADE CLIPPER SURG (BLADE) IMPLANT
BNDG ELASTIC 4X5.8 VLCR STR LF (GAUZE/BANDAGES/DRESSINGS) ×2 IMPLANT
BNDG ELASTIC 6X5.8 VLCR STR LF (GAUZE/BANDAGES/DRESSINGS) ×2 IMPLANT
BNDG GAUZE ELAST 4 BULKY (GAUZE/BANDAGES/DRESSINGS) ×2 IMPLANT
BONE CANC CHIPS 40CC CAN1/2 (Bone Implant) IMPLANT
BOWL CEMENT MIX W/ADAPTER (MISCELLANEOUS) ×2 IMPLANT
BRUSH SCRUB EZ PLAIN DRY (MISCELLANEOUS) ×4 IMPLANT
CANISTER SUCT 3000ML PPV (MISCELLANEOUS) ×2 IMPLANT
CANISTER WOUNDNEG PRESSURE 500 (CANNISTER) ×2 IMPLANT
CEMENT BONE REFOBACIN R1X40 US (Cement) IMPLANT
CHIPS CANC BONE 40CC CAN1/2 (Bone Implant) IMPLANT
COVER SURGICAL LIGHT HANDLE (MISCELLANEOUS) ×2 IMPLANT
COVER WAND RF STERILE (DRAPES) ×2 IMPLANT
DRAPE C-ARM 42X72 X-RAY (DRAPES) IMPLANT
DRAPE C-ARMOR (DRAPES) IMPLANT
DRAPE IMP U-DRAPE 54X76 (DRAPES) ×2 IMPLANT
DRAPE ORTHO SPLIT 77X108 STRL (DRAPES) ×4
DRAPE SURG ORHT 6 SPLT 77X108 (DRAPES) ×2 IMPLANT
DRAPE U-SHAPE 47X51 STRL (DRAPES) ×2 IMPLANT
DRSG ADAPTIC 3X8 NADH LF (GAUZE/BANDAGES/DRESSINGS) IMPLANT
DRSG PAD ABDOMINAL 8X10 ST (GAUZE/BANDAGES/DRESSINGS) ×8 IMPLANT
DRSG VAC ATS MED SENSATRAC (GAUZE/BANDAGES/DRESSINGS) ×2 IMPLANT
ELECT REM PT RETURN 9FT ADLT (ELECTROSURGICAL) ×2
ELECTRODE REM PT RTRN 9FT ADLT (ELECTROSURGICAL) ×1 IMPLANT
EVACUATOR 1/8 PVC DRAIN (DRAIN) IMPLANT
EVACUATOR 3/16  PVC DRAIN (DRAIN)
EVACUATOR 3/16 PVC DRAIN (DRAIN) IMPLANT
GAUZE SPONGE 4X4 12PLY STRL (GAUZE/BANDAGES/DRESSINGS) ×2 IMPLANT
GLOVE BIO SURGEON STRL SZ7.5 (GLOVE) ×2 IMPLANT
GLOVE BIO SURGEON STRL SZ8 (GLOVE) ×2 IMPLANT
GLOVE BIOGEL PI IND STRL 7.5 (GLOVE) ×1 IMPLANT
GLOVE BIOGEL PI IND STRL 8 (GLOVE) ×1 IMPLANT
GLOVE BIOGEL PI INDICATOR 7.5 (GLOVE) ×1
GLOVE BIOGEL PI INDICATOR 8 (GLOVE) ×1
GOWN STRL REUS W/ TWL LRG LVL3 (GOWN DISPOSABLE) ×2 IMPLANT
GOWN STRL REUS W/ TWL XL LVL3 (GOWN DISPOSABLE) ×1 IMPLANT
GOWN STRL REUS W/TWL LRG LVL3 (GOWN DISPOSABLE) ×4
GOWN STRL REUS W/TWL XL LVL3 (GOWN DISPOSABLE) ×2
KIT BASIN OR (CUSTOM PROCEDURE TRAY) ×2 IMPLANT
KIT TURNOVER KIT B (KITS) ×2 IMPLANT
NEEDLE 22X1 1/2 (OR ONLY) (NEEDLE) IMPLANT
NS IRRIG 1000ML POUR BTL (IV SOLUTION) ×2 IMPLANT
PACK TOTAL JOINT (CUSTOM PROCEDURE TRAY) ×2 IMPLANT
PACK UNIVERSAL I (CUSTOM PROCEDURE TRAY) ×2 IMPLANT
PAD ARMBOARD 7.5X6 YLW CONV (MISCELLANEOUS) ×4 IMPLANT
PAD CAST 4YDX4 CTTN HI CHSV (CAST SUPPLIES) ×1 IMPLANT
PADDING CAST COTTON 4X4 STRL (CAST SUPPLIES) ×2
PADDING CAST COTTON 6X4 STRL (CAST SUPPLIES) ×2 IMPLANT
SET CYSTO W/LG BORE CLAMP LF (SET/KITS/TRAYS/PACK) ×4 IMPLANT
SPONGE LAP 18X18 RF (DISPOSABLE) ×2 IMPLANT
STAPLER VISISTAT 35W (STAPLE) ×4 IMPLANT
SUCTION FRAZIER HANDLE 10FR (MISCELLANEOUS) ×1
SUCTION TUBE FRAZIER 10FR DISP (MISCELLANEOUS) ×1 IMPLANT
SUT PROLENE 0 CT 2 (SUTURE) IMPLANT
SUT VIC AB 0 CT1 27 (SUTURE) ×4
SUT VIC AB 0 CT1 27XBRD ANBCTR (SUTURE) ×2 IMPLANT
SUT VIC AB 1 CT1 27 (SUTURE) ×4
SUT VIC AB 1 CT1 27XBRD ANBCTR (SUTURE) ×2 IMPLANT
SUT VIC AB 2-0 CT1 27 (SUTURE) ×4
SUT VIC AB 2-0 CT1 TAPERPNT 27 (SUTURE) ×2 IMPLANT
SYR 20ML ECCENTRIC (SYRINGE) IMPLANT
TOWEL GREEN STERILE (TOWEL DISPOSABLE) ×4 IMPLANT
TOWEL GREEN STERILE FF (TOWEL DISPOSABLE) ×2 IMPLANT
TRAY FOLEY MTR SLVR 16FR STAT (SET/KITS/TRAYS/PACK) IMPLANT
TUBING BULK SUCTION (MISCELLANEOUS) ×4 IMPLANT
WATER STERILE IRR 1000ML POUR (IV SOLUTION) ×4 IMPLANT
YANKAUER SUCT BULB TIP NO VENT (SUCTIONS) ×4 IMPLANT

## 2020-11-02 NOTE — Anesthesia Procedure Notes (Signed)
Procedure Name: Intubation Date/Time: 11/02/2020 1:43 PM Performed by: Shary Decamp, CRNA Pre-anesthesia Checklist: Patient identified, Patient being monitored, Timeout performed, Emergency Drugs available and Suction available Patient Re-evaluated:Patient Re-evaluated prior to induction Oxygen Delivery Method: Circle System Utilized Preoxygenation: Pre-oxygenation with 100% oxygen Induction Type: IV induction Ventilation: Mask ventilation without difficulty Laryngoscope Size: Miller and 2 Grade View: Grade I Tube type: Oral Tube size: 7.0 mm Number of attempts: 1 Airway Equipment and Method: Stylet Placement Confirmation: ETT inserted through vocal cords under direct vision,  positive ETCO2 and breath sounds checked- equal and bilateral Secured at: 21 cm Tube secured with: Tape Dental Injury: Teeth and Oropharynx as per pre-operative assessment
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
## 2020-11-02 NOTE — Transfer of Care (Addendum)
Immediate Anesthesia Transfer of Care Note  Patient: Sara [name]  Procedure(s) Performed: INCISION DRAINAGE DEEP WOUND LEFT LEG, APPLICATION OF WOUND VAC (Left Leg Upper)  Patient Location: PACU  Anesthesia Type:General  Level of Consciousness: awake, alert  and oriented  Airway & Oxygen Therapy: Patient Spontanous Breathing and Patient connected to face mask oxygen  Post-op Assessment: Report given to RN  Post vital signs: Reviewed and stable  Last Vitals:  Vitals Value Taken Time  BP 97/55   Temp    Pulse 83   Resp 20   SpO2 99     Last Pain:  Vitals:   11/02/20 0825  TempSrc: Oral  PainSc:          Complications: No complications documented.

## 2020-11-02 NOTE — Progress Notes (Addendum)
Calorie Count Note  Diet: Dysphagia 1 diet with thin liquids.  Supplements:   Ensure Enlive po QID, each supplement provides 350 kcal and 20 grams of protein  30 ml Prosource plus po TID, each supplement provides 100 kcal and 15 grams of protein.   Wednesday, 11/3: Breakfast: 0% Lunch: 10 kcal and 0 grams of protein Dinner: none recorded Supplements: 550 kcal and 50 grams of protein  Day 1 Total intake: 560 kcal (25% of kcal needs)  50 grams of protein (38% of protein needs)  Estimated Nutritional Needs:  Kcal:  2250-2625 kcals Protein:  130-150 grams Fluid:  >/= 2 L  Pt is currently NPO in OR for grafting L distal femur and removal of abx spacer.   Nutrition Dx:  Inadequate oral intake related to acute illness as evidenced by NPO status; diet advanced; progressing  Goal:  Patient will meet greater than or equal to 90% of their needs; progressing  Intervention:   Calorie count to resume once diet advances post op as tolerated/appropriate.   Continue nutritional supplementation as diet advances.  Roslyn Smiling, MS, RD, LDN RD pager number/after hours weekend pager number on Amion.

## 2020-11-02 NOTE — Progress Notes (Signed)
Patient is off the floor to the OR.

## 2020-11-02 NOTE — Progress Notes (Signed)
OT Cancellation Note  Patient Details Name: Sara [name] MRN: [id number] DOB: 1966/09/20   Cancelled Treatment:    Reason Eval/Treat Not Completed: Patient at procedure or test/ unavailable.  Will reattempt.  Eber Jones., OTR/L Acute Rehabilitation Services Pager [phone] Office [phone]   Jeani Hawking M 11/02/2020, 11:54 AM

## 2020-11-02 NOTE — OR Nursing (Signed)
Pt in room, attached to cardiac monitor NSR noted onmonitor, nurse in room

## 2020-11-02 NOTE — Progress Notes (Signed)
Patient returned to floor from PACU. VS WNL. Patient resting in bed in no distress at this time. Call bell in reach wheels locked.
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
## 2020-11-02 NOTE — OR Nursing (Signed)
Pt is awake,alert and oriented.Pt and/or family verbalized understanding of poc and discharge instructions. Reviewed admission and on going care with receiving RN. Pt is in NAD at this time and is ready to be transferred to floor. Will con't to monitor until pt is transferred. Belongings on bed with patient Pt on 02 at 2l/min Pt on Monitor NSR noted on monitor, pt taken with RN and Tech to floor

## 2020-11-02 NOTE — Progress Notes (Signed)
PT Cancellation Note  Patient Details Name: Sara [name] MRN: [id number] DOB: 23-Jan-1966   Cancelled Treatment:    Reason Eval/Treat Not Completed: Patient at procedure or test/unavailable. Pt currently at OR with ortho for spacer remover L femur and graft. PT will continue to follow and treat as appropriate.   Rolm Baptise, PT, DPT   Acute Rehabilitation Department Pager #: [phone]   [name] 11/02/2020, 2:16 PM

## 2020-11-02 NOTE — Anesthesia Preprocedure Evaluation (Addendum)
Anesthesia Evaluation  Patient identified by MRN, date of birth, ID band Patient awake    Reviewed: Allergy & Precautions, NPO status , Patient's Chart, lab work & pertinent test results  Airway Mallampati: III  TM Distance: >3 FB Neck ROM: Full    Dental  (+) Dental Advisory Given, Missing, Poor Dentition   Pulmonary neg pulmonary ROS, Patient abstained from smoking.,    Pulmonary exam normal breath sounds clear to auscultation       Cardiovascular negative cardio ROS Normal cardiovascular exam Rhythm:Regular Rate:Normal  08/2020 ECHO: EF 60-65%, no significant valvular abnormalities   Neuro/Psych negative neurological ROS     GI/Hepatic negative GI ROS, Neg liver ROS,   Endo/Other  negative endocrine ROSMorbid obesity  Renal/GU negative Renal ROS     Musculoskeletal negative musculoskeletal ROS (+)   Abdominal (+) + obese,   Peds  Hematology negative hematology ROS (+) anemia , Hb 8.8   Anesthesia Other Findings   Reproductive/Obstetrics                            Anesthesia Physical Anesthesia Plan  ASA: III  Anesthesia Plan: General   Post-op Pain Management:    Induction: Intravenous  PONV Risk Score and Plan: 4 or greater and Ondansetron, Dexamethasone, Treatment may vary due to age or medical condition and Midazolam  Airway Management Planned: Oral ETT and LMA  Additional Equipment: None  Intra-op Plan:   Post-operative Plan: Extubation in OR  Informed Consent: I have reviewed the patients History and Physical, chart, labs and discussed the procedure including the risks, benefits and alternatives for the proposed anesthesia with the patient or authorized representative who has indicated his/her understanding and acceptance.     Dental advisory given  Plan Discussed with: CRNA  Anesthesia Plan Comments:         Anesthesia Quick Evaluation

## 2020-11-02 NOTE — Anesthesia Postprocedure Evaluation (Signed)
Anesthesia Post Note  Patient: Sara [name]  Procedure(s) Performed: INCISION DRAINAGE DEEP WOUND LEFT LEG, APPLICATION OF WOUND VAC (Left Leg Upper)     Patient location during evaluation: PACU Anesthesia Type: General Level of consciousness: awake and alert Pain management: pain level controlled Vital Signs Assessment: post-procedure vital signs reviewed and stable Respiratory status: spontaneous breathing, nonlabored ventilation, respiratory function stable and patient connected to nasal cannula oxygen Cardiovascular status: blood pressure returned to baseline and stable Postop Assessment: no apparent nausea or vomiting Anesthetic complications: no   No complications documented.  Last Vitals:  Vitals:   11/02/20 1615 11/02/20 1649  BP: (!) 104/48 105/61  Pulse: 87 91  Resp: 20 18  Temp:  36.8 C  SpO2: 95% 95%    Last Pain:  Vitals:   11/02/20 1649  TempSrc: Axillary  PainSc:                  Annamay Laymon,W. EDMOND

## 2020-11-03 ENCOUNTER — Encounter (HOSPITAL_COMMUNITY): Payer: Self-pay | Admitting: Orthopedic Surgery

## 2020-11-03 DIAGNOSIS — N179 Acute kidney failure, unspecified: Secondary | ICD-10-CM | POA: Diagnosis not present

## 2020-11-03 DIAGNOSIS — J90 Pleural effusion, not elsewhere classified: Secondary | ICD-10-CM | POA: Diagnosis not present

## 2020-11-03 DIAGNOSIS — D62 Acute posthemorrhagic anemia: Secondary | ICD-10-CM | POA: Diagnosis not present

## 2020-11-03 LAB — GLUCOSE, CAPILLARY
Glucose-Capillary: 106 mg/dL — ABNORMAL HIGH (ref 70–99)
Glucose-Capillary: 122 mg/dL — ABNORMAL HIGH (ref 70–99)
Glucose-Capillary: 123 mg/dL — ABNORMAL HIGH (ref 70–99)
Glucose-Capillary: 133 mg/dL — ABNORMAL HIGH (ref 70–99)
Glucose-Capillary: 135 mg/dL — ABNORMAL HIGH (ref 70–99)
Glucose-Capillary: 140 mg/dL — ABNORMAL HIGH (ref 70–99)
Glucose-Capillary: 85 mg/dL (ref 70–99)

## 2020-11-03 LAB — BASIC METABOLIC PANEL
Anion gap: 13 (ref 5–15)
BUN: 57 mg/dL — ABNORMAL HIGH (ref 6–20)
CO2: 14 mmol/L — ABNORMAL LOW (ref 22–32)
Calcium: 9.7 mg/dL (ref 8.9–10.3)
Chloride: 110 mmol/L (ref 98–111)
Creatinine, Ser: 1.02 mg/dL — ABNORMAL HIGH (ref 0.44–1.00)
GFR, Estimated: >60 mL/min (ref >60)
Glucose, Bld: 134 mg/dL — ABNORMAL HIGH (ref 70–99)
Potassium: 4.1 mmol/L (ref 3.5–5.1)
Sodium: 137 mmol/L (ref 135–145)

## 2020-11-03 LAB — VITAMIN D 25 HYDROXY (VIT D DEFICIENCY, FRACTURES): Vit D, 25-Hydroxy: 36.24 ng/mL (ref 30–100)

## 2020-11-03 MED ORDER — PROSOURCE PLUS PO LIQD
30.0000 mL | Freq: Four times a day (QID) | ORAL | Status: DC
  Administered 2020-11-03 – 2020-11-07 (×10): 30 mL via ORAL
  Filled 2020-11-03 (×9): qty 30
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
## 2020-11-03 NOTE — Consult Note (Signed)
WOC Nurse wound follow up Patient receiving care in [facility] 4N14.  PA to bedside for abdominal wound assessment. Wound type: healing abdominal surgical wound Measurement: Right tunnel measures 3.2 cm; left tunnel measures 4.8 cm Wound bed: beefy red granulation tissue; small area of yellow slough at left lower border Drainage (amount, consistency, odor) serosanginous Periwound: intact Dressing procedure/placement/frequency: All pieces of white and black foam removed from wound bed and tunnels.  Each tunnel filled with a narrow piece of white foam. Black foam placed over entire wound, drape applied, immediate seal obtained. Abdominal VAC dressing supplies at bedside for next week.  WOC Nurse ostomy follow up Stoma type/location: LLQ colostomy Stomal assessment/size: 1.5 inches, slightly budded, round Peristomal assessment: intact Treatment options for stomal/peristomal skin: barrier ring Output: thin green   Ostomy pouching: 2pc. 2 and 3/4 inch high out put skin barrier and pouch which is connected to a bedside drainage bag. Education provided: none Enrolled patient in [employer] Discharge program: No  Patient tolerated well. Helmut Muster, RN, MSN, CWOCN, CNS-BC, pager [phone]

## 2020-11-03 NOTE — Progress Notes (Signed)
Patient refused ensure enlive tonight. prosouce plus given through cortrak. Patient was able to take pills one at a time. Patient also refused to have oral care.   Will continue to assess.
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
## 2020-11-04 DIAGNOSIS — J90 Pleural effusion, not elsewhere classified: Secondary | ICD-10-CM | POA: Diagnosis not present

## 2020-11-04 DIAGNOSIS — D62 Acute posthemorrhagic anemia: Secondary | ICD-10-CM | POA: Diagnosis not present

## 2020-11-04 LAB — CBC
HCT: 29.9 % — ABNORMAL LOW (ref 36.0–46.0)
Hemoglobin: 9 g/dL — ABNORMAL LOW (ref 12.0–15.0)
MCH: 30.2 pg (ref 26.0–34.0)
MCHC: 30.1 g/dL (ref 30.0–36.0)
MCV: 100.3 fL — ABNORMAL HIGH (ref 80.0–100.0)
Platelets: 484 10*3/uL — ABNORMAL HIGH (ref 150–400)
RBC: 2.98 MIL/uL — ABNORMAL LOW (ref 3.87–5.11)
RDW: 17.4 % — ABNORMAL HIGH (ref 11.5–15.5)
WBC: 16.2 10*3/uL — ABNORMAL HIGH (ref 4.0–10.5)
nRBC: 0.1 % (ref 0.0–0.2)

## 2020-11-04 LAB — GLUCOSE, CAPILLARY
Glucose-Capillary: 103 mg/dL — ABNORMAL HIGH (ref 70–99)
Glucose-Capillary: 108 mg/dL — ABNORMAL HIGH (ref 70–99)
Glucose-Capillary: 132 mg/dL — ABNORMAL HIGH (ref 70–99)
Glucose-Capillary: 138 mg/dL — ABNORMAL HIGH (ref 70–99)
Glucose-Capillary: 89 mg/dL (ref 70–99)
Glucose-Capillary: 90 mg/dL (ref 70–99)

## 2020-11-04 LAB — BASIC METABOLIC PANEL
Anion gap: 9 (ref 5–15)
BUN: 56 mg/dL — ABNORMAL HIGH (ref 6–20)
CO2: 17 mmol/L — ABNORMAL LOW (ref 22–32)
Calcium: 10.3 mg/dL (ref 8.9–10.3)
Chloride: 112 mmol/L — ABNORMAL HIGH (ref 98–111)
Creatinine, Ser: 0.96 mg/dL (ref 0.44–1.00)
GFR, Estimated: >60 mL/min (ref >60)
Glucose, Bld: 91 mg/dL (ref 70–99)
Potassium: 3.4 mmol/L — ABNORMAL LOW (ref 3.5–5.1)
Sodium: 138 mmol/L (ref 135–145)
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
## 2020-11-05 DIAGNOSIS — D62 Acute posthemorrhagic anemia: Secondary | ICD-10-CM | POA: Diagnosis not present

## 2020-11-05 DIAGNOSIS — J90 Pleural effusion, not elsewhere classified: Secondary | ICD-10-CM | POA: Diagnosis not present

## 2020-11-05 LAB — GLUCOSE, CAPILLARY
Glucose-Capillary: 96 mg/dL (ref 70–99)
Glucose-Capillary: 96 mg/dL (ref 70–99)
Glucose-Capillary: 99 mg/dL (ref 70–99)
Glucose-Capillary: 95 mg/dL (ref 70–99)
Glucose-Capillary: 158 mg/dL — ABNORMAL HIGH (ref 70–99)
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
## 2020-11-05 NOTE — Progress Notes (Signed)
PT spit meds out when this nurse attempted to give them crushed in applesauce. Pt stated she just couldn't help it.

## 2020-11-06 ENCOUNTER — Encounter (HOSPITAL_COMMUNITY): Payer: Self-pay

## 2020-11-06 ENCOUNTER — Inpatient Hospital Stay (HOSPITAL_COMMUNITY): Payer: Medicaid Other

## 2020-11-06 ENCOUNTER — Encounter (HOSPITAL_COMMUNITY): Admission: EM | Disposition: A | Discharge status: Rehabilitation Facility | Payer: Self-pay | Source: Home / Self Care

## 2020-11-06 ENCOUNTER — Inpatient Hospital Stay (HOSPITAL_COMMUNITY): Payer: Medicaid Other | Admitting: Anesthesiology

## 2020-11-06 DIAGNOSIS — J869 Pyothorax without fistula: Secondary | ICD-10-CM | POA: Diagnosis not present

## 2020-11-06 DIAGNOSIS — Z23 Encounter for immunization: Secondary | ICD-10-CM | POA: Diagnosis not present

## 2020-11-06 DIAGNOSIS — S2243XA Multiple fractures of ribs, bilateral, initial encounter for closed fracture: Secondary | ICD-10-CM | POA: Diagnosis not present

## 2020-11-06 DIAGNOSIS — D689 Coagulation defect, unspecified: Secondary | ICD-10-CM | POA: Diagnosis not present

## 2020-11-06 DIAGNOSIS — S82142A Displaced bicondylar fracture of left tibia, initial encounter for closed fracture: Secondary | ICD-10-CM | POA: Diagnosis not present

## 2020-11-06 DIAGNOSIS — S72452C Displaced supracondylar fracture without intracondylar extension of lower end of left femur, initial encounter for open fracture type IIIA, IIIB, or IIIC: Secondary | ICD-10-CM | POA: Diagnosis not present

## 2020-11-06 DIAGNOSIS — K659 Peritonitis, unspecified: Secondary | ICD-10-CM | POA: Diagnosis not present

## 2020-11-06 DIAGNOSIS — J90 Pleural effusion, not elsewhere classified: Secondary | ICD-10-CM | POA: Diagnosis not present

## 2020-11-06 DIAGNOSIS — S52501A Unspecified fracture of the lower end of right radius, initial encounter for closed fracture: Secondary | ICD-10-CM | POA: Diagnosis not present

## 2020-11-06 DIAGNOSIS — D649 Anemia, unspecified: Secondary | ICD-10-CM | POA: Diagnosis not present

## 2020-11-06 DIAGNOSIS — J80 Acute respiratory distress syndrome: Secondary | ICD-10-CM | POA: Diagnosis not present

## 2020-11-06 DIAGNOSIS — S72462C Displaced supracondylar fracture with intracondylar extension of lower end of left femur, initial encounter for open fracture type IIIA, IIIB, or IIIC: Secondary | ICD-10-CM | POA: Diagnosis not present

## 2020-11-06 DIAGNOSIS — T8140XA Infection following a procedure, unspecified, initial encounter: Secondary | ICD-10-CM | POA: Diagnosis not present

## 2020-11-06 DIAGNOSIS — Z20822 Contact with and (suspected) exposure to covid-19: Secondary | ICD-10-CM | POA: Diagnosis not present

## 2020-11-06 DIAGNOSIS — S36892A Contusion of other intra-abdominal organs, initial encounter: Secondary | ICD-10-CM | POA: Diagnosis not present

## 2020-11-06 DIAGNOSIS — M86152 Other acute osteomyelitis, left femur: Secondary | ICD-10-CM | POA: Diagnosis not present

## 2020-11-06 DIAGNOSIS — D62 Acute posthemorrhagic anemia: Secondary | ICD-10-CM | POA: Diagnosis not present

## 2020-11-06 HISTORY — PX: I & D EXTREMITY: SHX5045

## 2020-11-06 LAB — CBC WITH DIFFERENTIAL/PLATELET
Abs Immature Granulocytes: 1.18 10*3/uL — ABNORMAL HIGH (ref 0.00–0.07)
Basophils Absolute: 0.1 10*3/uL (ref 0.0–0.1)
Basophils Relative: 0 %
Eosinophils Absolute: 0.3 10*3/uL (ref 0.0–0.5)
Eosinophils Relative: 2 %
HCT: 29.5 % — ABNORMAL LOW (ref 36.0–46.0)
Hemoglobin: 8.9 g/dL — ABNORMAL LOW (ref 12.0–15.0)
Immature Granulocytes: 10 %
Lymphocytes Relative: 9 %
Lymphs Abs: 1.1 10*3/uL (ref 0.7–4.0)
MCH: 29.9 pg (ref 26.0–34.0)
MCHC: 30.2 g/dL (ref 30.0–36.0)
MCV: 99 fL (ref 80.0–100.0)
Monocytes Absolute: 0.7 10*3/uL (ref 0.1–1.0)
Monocytes Relative: 6 %
Neutro Abs: 8.3 10*3/uL — ABNORMAL HIGH (ref 1.7–7.7)
Neutrophils Relative %: 73 %
Platelets: 492 10*3/uL — ABNORMAL HIGH (ref 150–400)
RBC: 2.98 MIL/uL — ABNORMAL LOW (ref 3.87–5.11)
RDW: 17.7 % — ABNORMAL HIGH (ref 11.5–15.5)
WBC: 11.5 10*3/uL — ABNORMAL HIGH (ref 4.0–10.5)
nRBC: 0.2 % (ref 0.0–0.2)

## 2020-11-06 LAB — BASIC METABOLIC PANEL
Anion gap: 7 (ref 5–15)
BUN: 48 mg/dL — ABNORMAL HIGH (ref 6–20)
CO2: 17 mmol/L — ABNORMAL LOW (ref 22–32)
Calcium: 10.4 mg/dL — ABNORMAL HIGH (ref 8.9–10.3)
Chloride: 114 mmol/L — ABNORMAL HIGH (ref 98–111)
Creatinine, Ser: 0.92 mg/dL (ref 0.44–1.00)
GFR, Estimated: >60 mL/min (ref >60)
Glucose, Bld: 97 mg/dL (ref 70–99)
Potassium: 2.9 mmol/L — ABNORMAL LOW (ref 3.5–5.1)
Sodium: 138 mmol/L (ref 135–145)

## 2020-11-06 LAB — GLUCOSE, CAPILLARY
Glucose-Capillary: 102 mg/dL — ABNORMAL HIGH (ref 70–99)
Glucose-Capillary: 103 mg/dL — ABNORMAL HIGH (ref 70–99)
Glucose-Capillary: 84 mg/dL (ref 70–99)
Glucose-Capillary: 90 mg/dL (ref 70–99)

## 2020-11-06 SURGERY — IRRIGATION AND DEBRIDEMENT EXTREMITY
Anesthesia: General | Site: Thigh | Laterality: Left

## 2020-11-06 MED ORDER — PHENYLEPHRINE HCL-NACL 10-0.9 MG/250ML-% IV SOLN
INTRAVENOUS | Status: DC | PRN
  Administered 2020-11-06: 50 ug/min via INTRAVENOUS

## 2020-11-06 MED ORDER — MIDAZOLAM HCL 5 MG/5ML IJ SOLN
INTRAMUSCULAR | Status: DC | PRN
  Administered 2020-11-06: 2 mg via INTRAVENOUS

## 2020-11-06 MED ORDER — PHENYLEPHRINE 40 MCG/ML (10ML) SYRINGE FOR IV PUSH (FOR BLOOD PRESSURE SUPPORT)
PREFILLED_SYRINGE | INTRAVENOUS | Status: DC | PRN
  Administered 2020-11-06 (×2): 80 ug via INTRAVENOUS

## 2020-11-06 MED ORDER — ONDANSETRON HCL 4 MG/2ML IJ SOLN
INTRAMUSCULAR | Status: DC | PRN
  Administered 2020-11-06: 4 mg via INTRAVENOUS

## 2020-11-06 MED ORDER — SODIUM CHLORIDE 0.9 % IR SOLN
Status: DC | PRN
  Administered 2020-11-06: 3000 mL

## 2020-11-06 MED ORDER — FENTANYL CITRATE (PF) 250 MCG/5ML IJ SOLN
INTRAMUSCULAR | Status: AC
  Filled 2020-11-06: qty 5

## 2020-11-06 MED ORDER — LACTATED RINGERS IV SOLN: INTRAVENOUS | Status: DC

## 2020-11-06 MED ORDER — ONDANSETRON HCL 4 MG/2ML IJ SOLN: 4.0000 mg | Freq: Four times a day (QID) | INTRAMUSCULAR | Status: DC | PRN

## 2020-11-06 MED ORDER — ROCURONIUM BROMIDE 10 MG/ML (PF) SYRINGE
PREFILLED_SYRINGE | INTRAVENOUS | Status: DC | PRN
  Administered 2020-11-06: 50 mg via INTRAVENOUS

## 2020-11-06 MED ORDER — 0.9 % SODIUM CHLORIDE (POUR BTL) OPTIME
TOPICAL | Status: DC | PRN
  Administered 2020-11-06: 1000 mL

## 2020-11-06 MED ORDER — SUGAMMADEX SODIUM 200 MG/2ML IV SOLN
INTRAVENOUS | Status: DC | PRN
  Administered 2020-11-06: 200 mg via INTRAVENOUS

## 2020-11-06 MED ORDER — ORAL CARE MOUTH RINSE: 15.0000 mL | Freq: Once | OROMUCOSAL | Status: DC

## 2020-11-06 MED ORDER — FENTANYL CITRATE (PF) 250 MCG/5ML IJ SOLN
INTRAMUSCULAR | Status: DC | PRN
  Administered 2020-11-06: 50 ug via INTRAVENOUS
  Administered 2020-11-06: 150 ug via INTRAVENOUS
  Administered 2020-11-06: 50 ug via INTRAVENOUS

## 2020-11-06 MED ORDER — IOHEXOL 9 MG/ML PO SOLN
500.0000 mL | ORAL | Status: AC
  Administered 2020-11-06 (×2): 500 mL via ORAL

## 2020-11-06 MED ORDER — CHLORHEXIDINE GLUCONATE 0.12 % MT SOLN: 15.0000 mL | Freq: Once | OROMUCOSAL | Status: DC

## 2020-11-06 MED ORDER — LIDOCAINE 2% (20 MG/ML) 5 ML SYRINGE
INTRAMUSCULAR | Status: DC | PRN
  Administered 2020-11-06: 50 mg via INTRAVENOUS

## 2020-11-06 MED ORDER — DEXAMETHASONE SODIUM PHOSPHATE 10 MG/ML IJ SOLN
INTRAMUSCULAR | Status: AC
  Filled 2020-11-06: qty 1

## 2020-11-06 MED ORDER — TOBRAMYCIN SULFATE 1.2 G IJ SOLR
INTRAMUSCULAR | Status: DC | PRN
  Administered 2020-11-06: 1.2 g

## 2020-11-06 MED ORDER — LIDOCAINE 2% (20 MG/ML) 5 ML SYRINGE
INTRAMUSCULAR | Status: AC
  Filled 2020-11-06: qty 15

## 2020-11-06 MED ORDER — DEXAMETHASONE SODIUM PHOSPHATE 10 MG/ML IJ SOLN
INTRAMUSCULAR | Status: DC | PRN
  Administered 2020-11-06: 8 mg via INTRAVENOUS

## 2020-11-06 MED ORDER — OXYCODONE HCL 5 MG/5ML PO SOLN: 5.0000 mg | Freq: Once | ORAL | Status: DC | PRN

## 2020-11-06 MED ORDER — VANCOMYCIN HCL 1000 MG IV SOLR
INTRAVENOUS | Status: DC | PRN
  Administered 2020-11-06: 1000 mg

## 2020-11-06 MED ORDER — POTASSIUM CHLORIDE 10 MEQ/100ML IV SOLN
10.0000 meq | INTRAVENOUS | Status: AC
  Administered 2020-11-06 (×5): 10 meq via INTRAVENOUS
  Filled 2020-11-06 (×4): qty 100

## 2020-11-06 MED ORDER — VANCOMYCIN HCL 1000 MG IV SOLR
INTRAVENOUS | Status: AC
  Filled 2020-11-06: qty 1000

## 2020-11-06 MED ORDER — ONDANSETRON HCL 4 MG/2ML IJ SOLN
INTRAMUSCULAR | Status: AC
  Filled 2020-11-06: qty 2

## 2020-11-06 MED ORDER — HYDROMORPHONE HCL 1 MG/ML IJ SOLN
INTRAMUSCULAR | Status: AC
  Filled 2020-11-06: qty 1

## 2020-11-06 MED ORDER — IOHEXOL 300 MG/ML  SOLN
75.0000 mL | Freq: Once | INTRAMUSCULAR | Status: AC | PRN
  Administered 2020-11-06: 75 mL via INTRAVENOUS

## 2020-11-06 MED ORDER — POTASSIUM CHLORIDE 10 MEQ/100ML IV SOLN
INTRAVENOUS | Status: AC
  Filled 2020-11-06: qty 100

## 2020-11-06 MED ORDER — TOBRAMYCIN SULFATE 1.2 G IJ SOLR
INTRAMUSCULAR | Status: AC
  Filled 2020-11-06: qty 1.2

## 2020-11-06 MED ORDER — PROPOFOL 10 MG/ML IV BOLUS
INTRAVENOUS | Status: DC | PRN
  Administered 2020-11-06: 50 mg via INTRAVENOUS
  Administered 2020-11-06: 150 mg via INTRAVENOUS

## 2020-11-06 MED ORDER — PROPOFOL 10 MG/ML IV BOLUS
INTRAVENOUS | Status: AC
  Filled 2020-11-06: qty 20

## 2020-11-06 MED ORDER — HYDROMORPHONE HCL 1 MG/ML IJ SOLN
0.2500 mg | INTRAMUSCULAR | Status: DC | PRN
  Administered 2020-11-06: 0.25 mg via INTRAVENOUS

## 2020-11-06 MED ORDER — MIDAZOLAM HCL 2 MG/2ML IJ SOLN
INTRAMUSCULAR | Status: AC
  Filled 2020-11-06: qty 2

## 2020-11-06 MED ORDER — OXYCODONE HCL 5 MG PO TABS: 5.0000 mg | ORAL_TABLET | Freq: Once | ORAL | Status: DC | PRN

## 2020-11-06 MED ORDER — PHENYLEPHRINE 40 MCG/ML (10ML) SYRINGE FOR IV PUSH (FOR BLOOD PRESSURE SUPPORT)
PREFILLED_SYRINGE | INTRAVENOUS | Status: AC
  Filled 2020-11-06: qty 10

## 2020-11-06 SURGICAL SUPPLY — 56 items
BNDG COHESIVE 4X5 TAN STRL (GAUZE/BANDAGES/DRESSINGS) IMPLANT
BNDG GAUZE ELAST 4 BULKY (GAUZE/BANDAGES/DRESSINGS) IMPLANT
BRUSH SCRUB EZ PLAIN DRY (MISCELLANEOUS) ×4 IMPLANT
CANISTER WOUNDNEG PRESSURE 500 (CANNISTER) ×2 IMPLANT
CEMENT BONE REFOBACIN R1X40 US (Cement) ×2 IMPLANT
CONT SPEC 4OZ CLIKSEAL STRL BL (MISCELLANEOUS) ×4 IMPLANT
COVER SURGICAL LIGHT HANDLE (MISCELLANEOUS) ×4 IMPLANT
DRAPE U-SHAPE 47X51 STRL (DRAPES) ×2 IMPLANT
DRESSING PREVENA PLUS CUSTOM (GAUZE/BANDAGES/DRESSINGS) ×1 IMPLANT
DRSG ADAPTIC 3X8 NADH LF (GAUZE/BANDAGES/DRESSINGS) IMPLANT
DRSG PREVENA PLUS CUSTOM (GAUZE/BANDAGES/DRESSINGS) ×2
ELECT REM PT RETURN 9FT ADLT (ELECTROSURGICAL)
ELECTRODE REM PT RTRN 9FT ADLT (ELECTROSURGICAL) IMPLANT
GAUZE SPONGE 4X4 12PLY STRL (GAUZE/BANDAGES/DRESSINGS) IMPLANT
GLOVE BIO SURGEON STRL SZ7.5 (GLOVE) ×4 IMPLANT
GLOVE BIO SURGEON STRL SZ8 (GLOVE) ×4 IMPLANT
GLOVE BIOGEL PI IND STRL 7.5 (GLOVE) ×2 IMPLANT
GLOVE BIOGEL PI IND STRL 8 (GLOVE) ×1 IMPLANT
GLOVE BIOGEL PI IND STRL 9 (GLOVE) ×1 IMPLANT
GLOVE BIOGEL PI INDICATOR 7.5 (GLOVE) ×2
GLOVE BIOGEL PI INDICATOR 8 (GLOVE) ×1
GLOVE BIOGEL PI INDICATOR 9 (GLOVE) ×1
GOWN STRL REUS W/ TWL LRG LVL3 (GOWN DISPOSABLE) ×2 IMPLANT
GOWN STRL REUS W/ TWL XL LVL3 (GOWN DISPOSABLE) ×1 IMPLANT
GOWN STRL REUS W/TWL LRG LVL3 (GOWN DISPOSABLE) ×4
GOWN STRL REUS W/TWL XL LVL3 (GOWN DISPOSABLE) ×2
HANDPIECE INTERPULSE COAX TIP (DISPOSABLE)
KIT BASIN OR (CUSTOM PROCEDURE TRAY) ×2 IMPLANT
KIT TURNOVER KIT B (KITS) ×2 IMPLANT
MANIFOLD NEPTUNE II (INSTRUMENTS) ×2 IMPLANT
NS IRRIG 1000ML POUR BTL (IV SOLUTION) ×2 IMPLANT
PACK ORTHO EXTREMITY (CUSTOM PROCEDURE TRAY) ×2 IMPLANT
PAD ARMBOARD 7.5X6 YLW CONV (MISCELLANEOUS) ×4 IMPLANT
PADDING CAST COTTON 6X4 STRL (CAST SUPPLIES) IMPLANT
SET CYSTO W/LG BORE CLAMP LF (SET/KITS/TRAYS/PACK) ×2 IMPLANT
SET HNDPC FAN SPRY TIP SCT (DISPOSABLE) IMPLANT
SOL PREP POV-IOD 4OZ 10% (MISCELLANEOUS) ×2 IMPLANT
SOL PREP PROV IODINE SCRUB 4OZ (MISCELLANEOUS) ×2 IMPLANT
SPONGE LAP 18X18 RF (DISPOSABLE) ×2 IMPLANT
STAPLER VISISTAT 35W (STAPLE) ×2 IMPLANT
STOCKINETTE IMPERVIOUS 9X36 MD (GAUZE/BANDAGES/DRESSINGS) IMPLANT
STOCKINETTE IMPERVIOUS LG (DRAPES) ×2 IMPLANT
SUCTION FRAZIER TIP 8 FR DISP (SUCTIONS) ×1
SUCTION TUBE FRAZIER 8FR DISP (SUCTIONS) ×1 IMPLANT
SUT ETHILON 2 0 FS 18 (SUTURE) ×6 IMPLANT
SUT PDS AB 0 CT1 27 (SUTURE) ×2 IMPLANT
SUT PDS AB 1 CT1 36 (SUTURE) ×2 IMPLANT
SUT PDS AB 2-0 CT1 27 (SUTURE) ×4 IMPLANT
SWAB CULTURE ESWAB REG 1ML (MISCELLANEOUS) ×2 IMPLANT
SWAB CULTURE LIQUID MINI MALE (MISCELLANEOUS) ×2 IMPLANT
TOWEL GREEN STERILE (TOWEL DISPOSABLE) ×4 IMPLANT
TOWEL GREEN STERILE FF (TOWEL DISPOSABLE) ×2 IMPLANT
TUBE CONNECTING 12X1/4 (SUCTIONS) ×2 IMPLANT
UNDERPAD 30X36 HEAVY ABSORB (UNDERPADS AND DIAPERS) ×2 IMPLANT
WATER STERILE IRR 1000ML POUR (IV SOLUTION) ×2 IMPLANT
YANKAUER SUCT BULB TIP NO VENT (SUCTIONS) ×2 IMPLANT

## 2020-11-06 NOTE — Progress Notes (Signed)
[facility] (OTS)  Day of Surgery Procedure(s) (LRB): IRRIGATION AND DEBRIDEMENT EXTREMITY (Left)  Subjective: Patient reports pain as significantly improved.    Objective: Current Vitals Blood pressure (!) 96/46, pulse 87, temperature 98.1 F (36.7 C), resp. rate 20, height 5\' 5"  (1.651 m), weight 102.5 kg, SpO2 99 %. Vital signs in last 24 hours: Temp:  [97.8 F (36.6 C)-98.4 F (36.9 C)] 98.1 F (36.7 C) (11/08 0717) Pulse Rate:  [87] 87 (11/08 0717) Resp:  [20] 20 (11/08 0717) BP: (90-122)/(45-55) 96/46 (11/08 0717) SpO2:  [99 %-100 %] 99 % (11/08 0717) Weight:  [102.5 kg] 102.5 kg (11/08 0453)  Intake/Output from previous day: 11/07 0701 - 11/08 0700 In: 1442.3 [P.O.:320; I.V.:510.3; IV Piggyback:612] Out: 5775 [Urine:3200; Drains:375; Stool:2200]  LABS Recent Labs    11/04/20 0353 11/06/20 0655  HGB 9.0* 8.9*   Recent Labs    11/04/20 0353 11/06/20 0655  WBC 16.2* 11.5*  RBC 2.98* 2.98*  HCT 29.9* 29.5*  PLT 484* 492*   Recent Labs    11/04/20 0353 11/06/20 0655  NA 138 138  K 3.4* 2.9*  CL 112* 114*  CO2 17* 17*  BUN 56* 48*  CREATININE 0.96 0.92  GLUCOSE 91 97  CALCIUM 10.3 10.4*   No results for input(s): LABPT, INR in the last 72 hours. Cultures remain negative but was on cefepime 2 days before surgery.  Physical Exam LLE Wound vac dressing intact, clean, dry with clear fluid, no erythema  Edema/ swelling controlled  Sens: DPN, SPN, TN intact  Motor: EHL, FHL, and lessor toe ext and flex all intact grossly  Brisk cap refill, warm to touch  Assessment/Plan: Day of Surgery Procedure(s) (LRB): IRRIGATION AND DEBRIDEMENT EXTREMITY (Left)  OR today for repeat I&D, probable insertion and removal of antibiotic spacer of the left femur.  I discussed with the patient, and earlier her daughter, the risks and benefits of surgery, including the possibility of persistent or recurrent infection, nerve injury, vessel injury, wound  breakdown, arthritis, symptomatic hardware, DVT/ PE, loss of motion, malunion, persistent nonunion, and need for further surgery among others. She acknowledged these risks and wished to proceed.   13/08/21, MD [facility] [phone] (p)

## 2020-11-06 NOTE — Transfer of Care (Signed)
Immediate Anesthesia Transfer of Care Note  Patient: Sara [name]  Procedure(s) Performed: IRRIGATION AND DEBRIDEMENT EXTREMITY (Left Thigh)  Patient Location: PACU  Anesthesia Type:General  Level of Consciousness: awake, alert  and oriented  Airway & Oxygen Therapy: Patient Spontanous Breathing and Patient connected to nasal cannula oxygen  Post-op Assessment: Report given to RN and Post -op Vital signs reviewed and stable  Post vital signs: Reviewed and stable  Last Vitals:  Vitals Value Taken Time  BP 94/55 11/06/20 1632  Temp    Pulse 78 11/06/20 1633  Resp 21 11/06/20 1633  SpO2 100 % 11/06/20 1633  Vitals shown include unvalidated device data.  Last Pain:  Vitals:   11/06/20 0717  TempSrc:   PainSc: Asleep      Patients Stated Pain Goal: 2 (11/05/20 2200)  Complications: No complications documented.

## 2020-11-06 NOTE — Brief Op Note (Signed)
11/06/2020  4:48 PM  PATIENT:  Sara [name] Below  54 y.o. female  PRE-OPERATIVE DIAGNOSIS:  wound infection left leg  POST-OPERATIVE DIAGNOSIS:  wound infection left leg  PROCEDURE:  Procedure(s): 1. PARTIAL EXCISION LEFT FEMUR 2. REMOVAL AND REINSERTION OF ANTIBIOTIC SPACER 3. WOUND VAC DRESSING CHANGE UNDER ANESTHESIA   SURGEON:  Surgeon(s) and Role:    * Myrene Galas, MD - Primary  PHYSICIAN ASSISTANT: 1. KEITH PAUL, PA-C; 2. PA Student  ANESTHESIA:   general  EBL:  75 mL   BLOOD ADMINISTERED:none  DRAINS: none   LOCAL MEDICATIONS USED:  NONE  SPECIMEN:  Source of Specimen:  bone  DISPOSITION OF SPECIMEN:  microbiology and path  COUNTS:  YES  TOURNIQUET:  * No tourniquets in log *  DICTATION: .Other Dictation: Dictation Number [id number]  PLAN OF CARE: Admit to inpatient   PATIENT DISPOSITION:  PACU - hemodynamically stable.   Delay start of Pharmacological VTE agent (>24hrs) due to surgical blood loss or risk of bleeding: no

## 2020-11-06 NOTE — Progress Notes (Signed)
PT Cancellation Note  Patient Details Name: Sara [name] MRN: [id number] DOB: 1966/09/25   Cancelled Treatment:    Reason Eval/Treat Not Completed: Patient at procedure or test/unavailable. Pt at OR with ortho for removal of spacer in LLE and grafting. PT will continue to follow and evaluate as time/schedule allows.   Rolm Baptise, PT, DPT   Acute Rehabilitation Department Pager #: [phone]   [name] 11/06/2020, 1:05 PM
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
## 2020-11-06 NOTE — Consult Note (Signed)
WOC Nurse Consult Note: Patient receiving care in [facility] 4N14.  Primary RN, Hospital doctor, and PA [employer] at bedside for NPWT dressing change and ostomy appliance change. The abdominal right lower tunnel now measures 2.8 cm; the left lower tunnel now measures 4.9 cm.  The abdominal wound is clean and pink.  All pieces of white and black foam were removed.  A piece of white foam was placed in each tunnel and across all of the main wound bed. It was topped with one piece of black foam and drape applied.  Immediate seal obtained.  WOC Nurse ostomy follow up Stoma type/location: RLQ colostomy Stomal assessment/size: round, slightly budded.  Today there is a 3.8 cm pocket from 3 to 6 o'clock that is draining red and tan foul smelling thin effluent.  There is also slight erythema to the abdomen in 2 different placed, that is tender to light pressure.  PA is aware. Peristomal assessment: as above.  The skin is intact without damage Treatment options for stomal/peristomal skin: I placed a ribbon of Aquacel into the pocket and left a tail hanging out.  I placed a barrier ring around the stoma and over the tail, then applied the high output appliance. Output: thin tan.  Last week on Friday it was thin and green. Ostomy pouching: 2pc. 2 and 3 4/ inch flat high output Supplies for Wednesday in room. Ostomy supplies in room also. Helmut Muster, RN, MSN, CWOCN, CNS-BC, pager [phone]

## 2020-11-06 NOTE — Progress Notes (Signed)
Nutrition Brief Note  Pt is currently NPO for irrigation and debridement of L extremity today. Per MD, plans to resume dysphagia 1 diet po post-op. No calorie count meal tickets recorded over the weekend. RD to resume calorie count once diet advances today. Daughter at bedside reports she has brought in food from home to encourage po intake as pt reports dislike of hospital food. Resume nutritional supplementation upon diet advancement to aid in healing as well as increased caloric and protein needs.   Roslyn Smiling, MS, RD, LDN Pager # [phone] After hours/ weekend pager # [phone]

## 2020-11-06 NOTE — Anesthesia Preprocedure Evaluation (Addendum)
Anesthesia Evaluation  Patient identified by MRN, date of birth, ID band Patient awake    Reviewed: Allergy & Precautions, H&P , NPO status , Patient's Chart, lab work & pertinent test results  Airway Mallampati: III  TM Distance: >3 FB Neck ROM: Full    Dental  (+) Poor Dentition, Missing, Chipped, Loose, Dental Advisory Given,    Pulmonary neg pulmonary ROS, Patient abstained from smoking.,     + decreased breath sounds      Cardiovascular negative cardio ROS   Rhythm:Regular Rate:Normal     Neuro/Psych negative neurological ROS  negative psych ROS   GI/Hepatic   Endo/Other  obese  Renal/GU      Musculoskeletal   Abdominal (+) + obese,   Peds  Hematology  (+) Blood dyscrasia, anemia ,   Anesthesia Other Findings   Reproductive/Obstetrics                                                             Anesthesia Evaluation  Patient identified by MRN, date of birth, ID band Patient awake    Reviewed: Allergy & Precautions, NPO status , Patient's Chart, lab work & pertinent test results  Airway Mallampati: III  TM Distance: >3 FB Neck ROM: Full    Dental  (+) Dental Advisory Given, Missing, Poor Dentition   Pulmonary neg pulmonary ROS, Patient abstained from smoking.,    Pulmonary exam normal breath sounds clear to auscultation       Cardiovascular negative cardio ROS Normal cardiovascular exam Rhythm:Regular Rate:Normal  08/2020 ECHO: EF 60-65%, no significant valvular abnormalities   Neuro/Psych negative neurological ROS     GI/Hepatic negative GI ROS, Neg liver ROS,   Endo/Other  negative endocrine ROSMorbid obesity  Renal/GU negative Renal ROS     Musculoskeletal negative musculoskeletal ROS (+)   Abdominal (+) + obese,   Peds  Hematology negative hematology ROS (+) anemia , Hb 8.8   Anesthesia Other Findings   Reproductive/Obstetrics                             Anesthesia Physical Anesthesia Plan  ASA: III  Anesthesia Plan: General   Post-op Pain Management:    Induction: Intravenous  PONV Risk Score and Plan: 4 or greater and Ondansetron, Dexamethasone, Treatment may vary due to age or medical condition and Midazolam  Airway Management Planned: Oral ETT and LMA  Additional Equipment: None  Intra-op Plan:   Post-operative Plan: Extubation in OR  Informed Consent: I have reviewed the patients History and Physical, chart, labs and discussed the procedure including the risks, benefits and alternatives for the proposed anesthesia with the patient or authorized representative who has indicated his/her understanding and acceptance.     Dental advisory given  Plan Discussed with: CRNA  Anesthesia Plan Comments:         Anesthesia Quick Evaluation  Anesthesia Physical Anesthesia Plan  ASA: II  Anesthesia Plan: General   Post-op Pain Management:    Induction: Intravenous  PONV Risk Score and Plan: 3 and Ondansetron, Dexamethasone, Midazolam and Treatment may vary due to age or medical condition  Airway Management Planned: Oral ETT  Additional Equipment:   Intra-op Plan:   Post-operative Plan: Extubation in OR  Informed Consent: I  have reviewed the patients History and Physical, chart, labs and discussed the procedure including the risks, benefits and alternatives for the proposed anesthesia with the patient or authorized representative who has indicated his/her understanding and acceptance.     Dental advisory given  Plan Discussed with: CRNA, Anesthesiologist and Surgeon  Anesthesia Plan Comments: (Echo:  1. Left ventricular ejection fraction, by estimation, is 60 to 65%. The  left ventricle has normal function. The left ventricle has no regional  wall motion abnormalities. Left ventricular diastolic parameters were  normal.  2. Right ventricular systolic  function is normal. The right ventricular  size is normal.  3. The mitral valve is normal in structure. No evidence of mitral valve  regurgitation. No evidence of mitral stenosis.  4. The aortic valve is normal in structure. Aortic valve regurgitation is  not visualized. No aortic stenosis is present.  5. The inferior vena cava is normal in size with greater than 50%  respiratory variability, suggesting right atrial pressure of 3 mmHg. )       Anesthesia Quick Evaluation

## 2020-11-06 NOTE — Progress Notes (Signed)
SLP Cancellation Note  Patient Details Name: Sara [name] MRN: [id number] DOB: 1966-10-21   Cancelled treatment:       Reason Eval/Treat Not Completed: Medical issues which prohibited therapy (pt NPO pending surgery today). Will f/u as able.   Mahala Menghini., M.A. CCC-SLP Acute Rehabilitation Services Pager [phone] Office [phone]  11/06/2020, 7:16 AM

## 2020-11-06 NOTE — Anesthesia Procedure Notes (Signed)
Procedure Name: Intubation Date/Time: 11/06/2020 2:53 PM Performed by: Marena Chancy, CRNA Pre-anesthesia Checklist: Patient identified, Emergency Drugs available, Suction available and Patient being monitored Patient Re-evaluated:Patient Re-evaluated prior to induction Oxygen Delivery Method: Circle System Utilized Preoxygenation: Pre-oxygenation with 100% oxygen Induction Type: IV induction Ventilation: Mask ventilation without difficulty Laryngoscope Size: Miller and 2 Grade View: Grade I Tube type: Oral Tube size: 7.0 mm Number of attempts: 1 Airway Equipment and Method: Stylet and Oral airway Placement Confirmation: ETT inserted through vocal cords under direct vision,  positive ETCO2 and breath sounds checked- equal and bilateral Tube secured with: Tape Dental Injury: Teeth and Oropharynx as per pre-operative assessment

## 2020-11-07 ENCOUNTER — Inpatient Hospital Stay (HOSPITAL_COMMUNITY): Payer: Medicaid Other

## 2020-11-07 ENCOUNTER — Encounter (HOSPITAL_COMMUNITY): Payer: Self-pay | Admitting: Orthopedic Surgery

## 2020-11-07 DIAGNOSIS — J869 Pyothorax without fistula: Secondary | ICD-10-CM

## 2020-11-07 DIAGNOSIS — D62 Acute posthemorrhagic anemia: Secondary | ICD-10-CM | POA: Diagnosis not present

## 2020-11-07 DIAGNOSIS — L02211 Cutaneous abscess of abdominal wall: Secondary | ICD-10-CM

## 2020-11-07 DIAGNOSIS — N179 Acute kidney failure, unspecified: Secondary | ICD-10-CM | POA: Diagnosis not present

## 2020-11-07 DIAGNOSIS — J90 Pleural effusion, not elsewhere classified: Secondary | ICD-10-CM | POA: Diagnosis not present

## 2020-11-07 DIAGNOSIS — M869 Osteomyelitis, unspecified: Secondary | ICD-10-CM

## 2020-11-07 LAB — AEROBIC/ANAEROBIC CULTURE W GRAM STAIN (SURGICAL/DEEP WOUND)
Culture: NO GROWTH
Culture: NO GROWTH

## 2020-11-07 LAB — GLUCOSE, CAPILLARY
Glucose-Capillary: 109 mg/dL — ABNORMAL HIGH (ref 70–99)
Glucose-Capillary: 110 mg/dL — ABNORMAL HIGH (ref 70–99)
Glucose-Capillary: 111 mg/dL — ABNORMAL HIGH (ref 70–99)
Glucose-Capillary: 115 mg/dL — ABNORMAL HIGH (ref 70–99)
Glucose-Capillary: 173 mg/dL — ABNORMAL HIGH (ref 70–99)
Glucose-Capillary: 78 mg/dL (ref 70–99)

## 2020-11-07 LAB — CBC
HCT: 34 % — ABNORMAL LOW (ref 36.0–46.0)
Hemoglobin: 10.1 g/dL — ABNORMAL LOW (ref 12.0–15.0)
MCH: 29.5 pg (ref 26.0–34.0)
MCHC: 29.7 g/dL — ABNORMAL LOW (ref 30.0–36.0)
MCV: 99.4 fL (ref 80.0–100.0)
Platelets: 555 10*3/uL — ABNORMAL HIGH (ref 150–400)
RBC: 3.42 MIL/uL — ABNORMAL LOW (ref 3.87–5.11)
RDW: 18.1 % — ABNORMAL HIGH (ref 11.5–15.5)
WBC: 13.5 10*3/uL — ABNORMAL HIGH (ref 4.0–10.5)
nRBC: 0.1 % (ref 0.0–0.2)

## 2020-11-07 LAB — BASIC METABOLIC PANEL
Anion gap: 10 (ref 5–15)
BUN: 46 mg/dL — ABNORMAL HIGH (ref 6–20)
CO2: 14 mmol/L — ABNORMAL LOW (ref 22–32)
Calcium: 11.2 mg/dL — ABNORMAL HIGH (ref 8.9–10.3)
Chloride: 113 mmol/L — ABNORMAL HIGH (ref 98–111)
Creatinine, Ser: 0.98 mg/dL (ref 0.44–1.00)
GFR, Estimated: >60 mL/min (ref >60)
Glucose, Bld: 131 mg/dL — ABNORMAL HIGH (ref 70–99)
Potassium: 4.4 mmol/L (ref 3.5–5.1)
Sodium: 137 mmol/L (ref 135–145)

## 2020-11-07 LAB — VANCOMYCIN, TROUGH: Vancomycin Tr: 51 ug/mL (ref 15–20)

## 2020-11-07 MED ORDER — VANCOMYCIN VARIABLE DOSE PER UNSTABLE RENAL FUNCTION (PHARMACIST DOSING): Status: DC

## 2020-11-07 MED ORDER — LIDOCAINE HCL 1 % IJ SOLN
INTRAMUSCULAR | Status: AC
  Filled 2020-11-07: qty 20

## 2020-11-07 MED ORDER — MIDAZOLAM HCL 2 MG/2ML IJ SOLN
INTRAMUSCULAR | Status: AC
  Filled 2020-11-07: qty 2

## 2020-11-07 MED ORDER — OSMOLITE 1.5 CAL PO LIQD
1120.0000 mL | ORAL | Status: DC
  Administered 2020-11-07 – 2020-11-09 (×3): 1120 mL
  Filled 2020-11-07 (×9): qty 2000

## 2020-11-07 MED ORDER — SODIUM CHLORIDE 0.9 % IV SOLN
2.0000 g | Freq: Three times a day (TID) | INTRAVENOUS | Status: DC
  Administered 2020-11-07 – 2020-11-08 (×3): 2 g via INTRAVENOUS
  Filled 2020-11-07 (×4): qty 2

## 2020-11-07 MED ORDER — ENOXAPARIN SODIUM 40 MG/0.4ML ~~LOC~~ SOLN
40.0000 mg | Freq: Two times a day (BID) | SUBCUTANEOUS | Status: DC
  Administered 2020-11-08 – 2020-12-14 (×62): 40 mg via SUBCUTANEOUS
  Filled 2020-11-07 (×65): qty 0.4

## 2020-11-07 MED ORDER — PROSOURCE TF PO LIQD
90.0000 mL | Freq: Two times a day (BID) | ORAL | Status: DC
  Administered 2020-11-07 – 2020-11-11 (×8): 90 mL
  Administered 2020-11-12: 45 mL
  Filled 2020-11-07 (×10): qty 90

## 2020-11-07 MED ORDER — METRONIDAZOLE 500 MG PO TABS
500.0000 mg | ORAL_TABLET | Freq: Three times a day (TID) | ORAL | Status: DC
  Administered 2020-11-07 – 2020-11-11 (×12): 500 mg via ORAL
  Filled 2020-11-07 (×12): qty 1

## 2020-11-07 MED ORDER — FENTANYL CITRATE (PF) 100 MCG/2ML IJ SOLN
INTRAMUSCULAR | Status: AC
  Filled 2020-11-07: qty 2

## 2020-11-07 MED ORDER — FENTANYL CITRATE (PF) 100 MCG/2ML IJ SOLN
INTRAMUSCULAR | Status: DC | PRN
Start: 2020-11-07 — End: 2020-11-12
  Administered 2020-11-07 (×2): 25 ug via INTRAVENOUS

## 2020-11-07 NOTE — Progress Notes (Signed)
CRITICAL VALUE ALERT  Critical Value:  51 Date & Time Notied:  1819  Provider Notified: Via Amion  Orders Received/Actions taken: Holding Vancomycin at this time per Pharmacy.
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
## 2020-11-07 NOTE — Procedures (Signed)
Interventional Radiology Procedure Note  Procedure:  1.) 35F LEFT chest tube yielding thick, purulent fluid.   2.) 84F LLQ abd wall abscess drain yielding thin purulent fluid.   Complications: None  Estimated Blood Loss: None  Recommendations: - Samples from both sent for cx - Chest tube to suction - Flush LLQ drain Q shift - LLQ drain to JP   Signed,  Sterling Big, MD

## 2020-11-07 NOTE — TOC Progression Note (Signed)
Transition of Care [facility]) - Progression Note    Patient Details  Name: TAMETRA AHART MRN: [id number] Date of Birth: [birth year]  Transition of Care [facility]) CM/SW Contact  Glennon Mac, RN Phone Number: 11/07/2020, 3:56 PM  Clinical Narrative: Provided clinical update to Denice Paradise, phone: [phone].        Expected Discharge Plan: Skilled Nursing Facility Barriers to Discharge: Continued Medical Work up  Expected Discharge Plan and Services Expected Discharge Plan: Skilled Nursing Facility   Discharge Planning Services: CM Consult   Living arrangements for the past 2 months: Single Family Home                                       Social Determinants of Health (SDOH) Interventions    Readmission Risk Interventions No flowsheet data found.  Quintella Baton, RN, BSN  Trauma/Neuro ICU Case Manager [phone]
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
## 2020-11-07 NOTE — Progress Notes (Signed)
SLP Cancellation Note  Patient Details Name: Sara [name] MRN: [id number] DOB: [birth year]   Cancelled treatment:    NPO for IR procedures today.  Will continue efforts.  Versie Soave L. Samson Frederic, MA CCC/SLP Acute Rehabilitation Services Office number [phone] Pager [phone]        Sara [name] 11/07/2020, 10:22 AM
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
## 2020-11-07 NOTE — Progress Notes (Signed)
PT Cancellation Note  Patient Details Name: Sara [name] MRN: [id number] DOB: 01/08/1966   Cancelled Treatment:    Reason Eval/Treat Not Completed: Patient at procedure or test/unavailable . Pt off the floor at IR. Acute PT to return as able to progress mobility.  Lewis Shock, PT, DPT Acute Rehabilitation Services Pager #: [phone] Office #: [phone]   Iona Hansen 11/07/2020, 11:22 AM
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
## 2020-11-07 NOTE — Anesthesia Postprocedure Evaluation (Signed)
Anesthesia Post Note  Patient: Sara [name]  Procedure(s) Performed: IRRIGATION AND DEBRIDEMENT EXTREMITY (Left Thigh)     Patient location during evaluation: PACU Anesthesia Type: General Level of consciousness: awake and alert Pain management: pain level controlled Vital Signs Assessment: post-procedure vital signs reviewed and stable Respiratory status: spontaneous breathing, nonlabored ventilation, respiratory function stable and patient connected to nasal cannula oxygen Cardiovascular status: blood pressure returned to baseline and stable Postop Assessment: no apparent nausea or vomiting Anesthetic complications: no   No complications documented.  Last Vitals:  Vitals:   11/07/20 0057 11/07/20 0310  BP:  115/65  Pulse:  89  Resp:  14  Temp:  36.6 C  SpO2: 95% 95%    Last Pain:  Vitals:   11/07/20 0310  TempSrc: Oral  PainSc:                  [name]

## 2020-11-07 NOTE — Progress Notes (Signed)
Attempted to see pt today. Pt leaving for interventional radiology.  Will attempt back as schedule allows. Tory Emerald, [place] [phone]
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
## 2020-11-08 ENCOUNTER — Inpatient Hospital Stay (HOSPITAL_COMMUNITY): Payer: Medicaid Other

## 2020-11-08 DIAGNOSIS — J869 Pyothorax without fistula: Secondary | ICD-10-CM | POA: Diagnosis not present

## 2020-11-08 DIAGNOSIS — D62 Acute posthemorrhagic anemia: Secondary | ICD-10-CM | POA: Diagnosis not present

## 2020-11-08 DIAGNOSIS — J90 Pleural effusion, not elsewhere classified: Secondary | ICD-10-CM | POA: Diagnosis not present

## 2020-11-08 DIAGNOSIS — L02211 Cutaneous abscess of abdominal wall: Secondary | ICD-10-CM

## 2020-11-08 DIAGNOSIS — M869 Osteomyelitis, unspecified: Secondary | ICD-10-CM | POA: Diagnosis not present

## 2020-11-08 DIAGNOSIS — T07XXXA Unspecified multiple injuries, initial encounter: Secondary | ICD-10-CM

## 2020-11-08 LAB — BASIC METABOLIC PANEL
Anion gap: 10 (ref 5–15)
BUN: 52 mg/dL — ABNORMAL HIGH (ref 6–20)
CO2: 13 mmol/L — ABNORMAL LOW (ref 22–32)
Calcium: 11.2 mg/dL — ABNORMAL HIGH (ref 8.9–10.3)
Chloride: 114 mmol/L — ABNORMAL HIGH (ref 98–111)
Creatinine, Ser: 0.97 mg/dL (ref 0.44–1.00)
GFR, Estimated: >60 mL/min (ref >60)
Glucose, Bld: 194 mg/dL — ABNORMAL HIGH (ref 70–99)
Potassium: 3 mmol/L — ABNORMAL LOW (ref 3.5–5.1)
Sodium: 137 mmol/L (ref 135–145)

## 2020-11-08 LAB — CBC
HCT: 30.3 % — ABNORMAL LOW (ref 36.0–46.0)
Hemoglobin: 9 g/dL — ABNORMAL LOW (ref 12.0–15.0)
MCH: 29.9 pg (ref 26.0–34.0)
MCHC: 29.7 g/dL — ABNORMAL LOW (ref 30.0–36.0)
MCV: 100.7 fL — ABNORMAL HIGH (ref 80.0–100.0)
Platelets: 568 10*3/uL — ABNORMAL HIGH (ref 150–400)
RBC: 3.01 MIL/uL — ABNORMAL LOW (ref 3.87–5.11)
RDW: 18 % — ABNORMAL HIGH (ref 11.5–15.5)
WBC: 10.5 10*3/uL (ref 4.0–10.5)
nRBC: 0.5 % — ABNORMAL HIGH (ref 0.0–0.2)

## 2020-11-08 LAB — VANCOMYCIN, RANDOM: Vancomycin Rm: 44

## 2020-11-08 LAB — SEDIMENTATION RATE: Sed Rate: 114 mm/hr — ABNORMAL HIGH (ref 0–22)

## 2020-11-08 LAB — C-REACTIVE PROTEIN: CRP: 7.8 mg/dL — ABNORMAL HIGH (ref <1.0)

## 2020-11-08 LAB — GLUCOSE, CAPILLARY
Glucose-Capillary: 129 mg/dL — ABNORMAL HIGH (ref 70–99)
Glucose-Capillary: 153 mg/dL — ABNORMAL HIGH (ref 70–99)
Glucose-Capillary: 158 mg/dL — ABNORMAL HIGH (ref 70–99)
Glucose-Capillary: 193 mg/dL — ABNORMAL HIGH (ref 70–99)
Glucose-Capillary: 89 mg/dL (ref 70–99)
Glucose-Capillary: 91 mg/dL (ref 70–99)

## 2020-11-08 MED ORDER — SODIUM CHLORIDE 0.9 % IV SOLN
2.0000 g | Freq: Two times a day (BID) | INTRAVENOUS | Status: DC
  Administered 2020-11-08 – 2020-11-12 (×8): 2 g via INTRAVENOUS
  Filled 2020-11-08 (×8): qty 2

## 2020-11-08 MED ORDER — POTASSIUM CHLORIDE 20 MEQ/15ML (10%) PO SOLN
40.0000 meq | Freq: Three times a day (TID) | ORAL | Status: AC
  Administered 2020-11-08 (×3): 40 meq
  Filled 2020-11-08 (×3): qty 30

## 2020-11-08 NOTE — Consult Note (Signed)
WOC Nurse ostomy follow up Patient receiving care in [facility] 4N14.   Stoma type/location: RLQ colostomy Stomal assessment/size: 1.5 inches, round, slightly budded.  MC separation remains from 3 - 6 o'clock.  Blood and purulent drainage came out of the area when pouch removed and Aquacel taken out of the MCS pocket.  Pocket easily measures 3.8 cm in depth. Peristomal assessment: intact Treatment options for stomal/peristomal skin:  Barrier ring and high output pouch Output: only tan purulent drainage in pouching system that is connected to bedside drain bag; a small amount of that.  Ostomy pouching: 2pc. [place] #s [phone] and [phone] Education provided: none. Patient remains too weak to learn Enrolled patient in [place] Secure Start Discharge program: No  Patient tolerated procedure well.  Only c/o was "chest pain".  Pain issues communicated to bedside nurse.  Helmut Muster, RN, MSN, CWOCN, CNS-BC, pager [phone]

## 2020-11-08 NOTE — Consult Note (Addendum)
WOC Nurse wound follow up Patient receiving care in [facility] 4N14. NPWT dressing change  The abdominal right lower tunnel now measures 1.3 cm; the left lower tunnel now measures 4.8cm. The abdominal wound is clean and pink and measures 11 cm x 8.8 cm x 0.7 cm.  All pieces of white and black foam removed (4 white, 1 black). A piece of white foam was placed in each tunnel and across all of the main wound bed. A black foam was placed over the white foam in the main wound bed. Drape applied. Immediate seal obtained.  Renaldo Reel Katrinka Blazing, MSN, RN, CMSRN, Angus Seller, [facility] Wound Treatment Associate Pager [phone]
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
## 2020-11-09 DIAGNOSIS — D62 Acute posthemorrhagic anemia: Secondary | ICD-10-CM | POA: Diagnosis not present

## 2020-11-09 DIAGNOSIS — J90 Pleural effusion, not elsewhere classified: Secondary | ICD-10-CM | POA: Diagnosis not present

## 2020-11-09 DIAGNOSIS — N179 Acute kidney failure, unspecified: Secondary | ICD-10-CM | POA: Diagnosis not present

## 2020-11-09 LAB — GLUCOSE, CAPILLARY
Glucose-Capillary: 108 mg/dL — ABNORMAL HIGH (ref 70–99)
Glucose-Capillary: 111 mg/dL — ABNORMAL HIGH (ref 70–99)
Glucose-Capillary: 135 mg/dL — ABNORMAL HIGH (ref 70–99)
Glucose-Capillary: 139 mg/dL — ABNORMAL HIGH (ref 70–99)
Glucose-Capillary: 141 mg/dL — ABNORMAL HIGH (ref 70–99)
Glucose-Capillary: 78 mg/dL (ref 70–99)
Glucose-Capillary: 86 mg/dL (ref 70–99)

## 2020-11-09 LAB — VANCOMYCIN, RANDOM: Vancomycin Rm: 28

## 2020-11-09 LAB — SURGICAL PATHOLOGY

## 2020-11-09 MED ORDER — BISACODYL 10 MG RE SUPP
10.0000 mg | Freq: Once | RECTAL | Status: DC
  Filled 2020-11-09: qty 1

## 2020-11-09 MED ORDER — PANTOPRAZOLE SODIUM 40 MG PO PACK
40.0000 mg | PACK | Freq: Every day | ORAL | Status: DC
  Administered 2020-11-09 – 2020-11-11 (×3): 40 mg
  Filled 2020-11-09 (×4): qty 20

## 2020-11-09 MED ORDER — BISACODYL 10 MG RE SUPP: 10.0000 mg | Freq: Every day | RECTAL | Status: DC | PRN

## 2020-11-09 MED ORDER — DRONABINOL 5 MG PO CAPS
5.0000 mg | ORAL_CAPSULE | Freq: Two times a day (BID) | ORAL | Status: DC
  Filled 2020-11-09: qty 1
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
## 2020-11-10 ENCOUNTER — Inpatient Hospital Stay (HOSPITAL_COMMUNITY): Payer: Medicaid Other

## 2020-11-10 DIAGNOSIS — D72829 Elevated white blood cell count, unspecified: Secondary | ICD-10-CM | POA: Diagnosis not present

## 2020-11-10 DIAGNOSIS — L02211 Cutaneous abscess of abdominal wall: Secondary | ICD-10-CM | POA: Diagnosis not present

## 2020-11-10 DIAGNOSIS — M868X8 Other osteomyelitis, other site: Secondary | ICD-10-CM | POA: Diagnosis not present

## 2020-11-10 DIAGNOSIS — D62 Acute posthemorrhagic anemia: Secondary | ICD-10-CM | POA: Diagnosis not present

## 2020-11-10 DIAGNOSIS — B379 Candidiasis, unspecified: Secondary | ICD-10-CM | POA: Diagnosis not present

## 2020-11-10 DIAGNOSIS — J869 Pyothorax without fistula: Secondary | ICD-10-CM | POA: Diagnosis not present

## 2020-11-10 DIAGNOSIS — F329 Major depressive disorder, single episode, unspecified: Secondary | ICD-10-CM | POA: Diagnosis not present

## 2020-11-10 DIAGNOSIS — J90 Pleural effusion, not elsewhere classified: Secondary | ICD-10-CM | POA: Diagnosis not present

## 2020-11-10 LAB — GLUCOSE, CAPILLARY
Glucose-Capillary: 113 mg/dL — ABNORMAL HIGH (ref 70–99)
Glucose-Capillary: 152 mg/dL — ABNORMAL HIGH (ref 70–99)
Glucose-Capillary: 85 mg/dL (ref 70–99)
Glucose-Capillary: 90 mg/dL (ref 70–99)
Glucose-Capillary: 98 mg/dL (ref 70–99)
Glucose-Capillary: 98 mg/dL (ref 70–99)

## 2020-11-10 LAB — VANCOMYCIN, RANDOM: Vancomycin Rm: 22

## 2020-11-10 MED ORDER — VORTIOXETINE HBR 20 MG PO TABS
20.0000 mg | ORAL_TABLET | Freq: Every day | ORAL | Status: DC
  Administered 2020-11-10 – 2020-11-16 (×3): 20 mg
  Filled 2020-11-10 (×7): qty 1

## 2020-11-10 MED ORDER — MEGESTROL ACETATE 400 MG/10ML PO SUSP
400.0000 mg | Freq: Two times a day (BID) | ORAL | Status: DC
  Administered 2020-11-10 – 2020-11-11 (×3): 400 mg
  Filled 2020-11-10 (×13): qty 10

## 2020-11-10 MED ORDER — AMPHETAMINE-DEXTROAMPHETAMINE 10 MG PO TABS
25.0000 mg | ORAL_TABLET | Freq: Two times a day (BID) | ORAL | Status: DC
  Administered 2020-11-10 – 2020-11-12 (×4): 25 mg
  Filled 2020-11-10 (×4): qty 3

## 2020-11-10 MED ORDER — VANCOMYCIN HCL IN DEXTROSE 1-5 GM/200ML-% IV SOLN
1000.0000 mg | INTRAVENOUS | Status: DC
  Administered 2020-11-11: 1000 mg via INTRAVENOUS
  Filled 2020-11-10: qty 200
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
## 2020-11-10 NOTE — Consult Note (Signed)
WOC Nurse wound follow up Patient receiving care in [facility] 4N14. Wound type: midline abdominal surgical wound Measurement: right tunnel measures 2.8 cm; left tunnel measures 4.3 cm Wound bed: 100% pink, clean Drainage (amount, consistency, odor) serous in cannister Periwound: intact Dressing procedure/placement/frequency: all pieces of white and black foam removed from wound bed.  Each tunnel was filled with a narrow strip of white foam.  The wound bed was cover with white foam, and a single piece of black foam was placed over the white foam; drape applied.  Immediate seal obtained.  WOC Nurse ostomy follow up Stoma type/location:  RLQ colostomy Stomal assessment/size: pink, barely budded Peristomal assessment: large, deep pocket of MCS remains from 3 - 6 o'clock with heavy purulent drainage coming from the site. The existing piece of Aquacel to the pocket was removed, and a new piece placed.  A tail of the Aquacel hangs out into the pouch for easy removal.  The pouching system placed 2 days ago is in good shape, and was not changed.  It is connected to a bedside drainage bag. Treatment options for stomal/peristomal skin: barrier ring Output:  Small amount of brown fecal effluent in addition to the purulent drainge  Ostomy pouching: 2pc. High output.  [place] # [phone] for the pouch, [place] #2 for the skin barrier, [place] # [phone] for barrier rings Education provided: none; patient chose not to interact today and kept her eyes closed throughout the care provided. Enrolled patient in [place] Secure Start Discharge program: No  Additional ostomy and VAC supplies list given to [place] for ordering.  There was a new draining area on the patient's left lateral lower abdomen.  It looks like a former puncture site of some type.  It had purulent drainage oozing from it.  I have placed the following order, and provided the following care to the area:  Cut a narrow strip of Aquacel [place] [phone]) and insert into  patient's left lateral lower abdomen site that is draining purulent material.  Leave a tail hanging out for removal.  Change twice daily and prn.  Helmut Muster, RN, MSN, CWOCN, CNS-BC, pager [phone]
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
## 2020-11-11 ENCOUNTER — Inpatient Hospital Stay (HOSPITAL_COMMUNITY): Payer: Medicaid Other

## 2020-11-11 DIAGNOSIS — D62 Acute posthemorrhagic anemia: Secondary | ICD-10-CM | POA: Diagnosis not present

## 2020-11-11 DIAGNOSIS — F329 Major depressive disorder, single episode, unspecified: Secondary | ICD-10-CM | POA: Diagnosis not present

## 2020-11-11 DIAGNOSIS — J90 Pleural effusion, not elsewhere classified: Secondary | ICD-10-CM | POA: Diagnosis not present

## 2020-11-11 LAB — BASIC METABOLIC PANEL
Anion gap: 6 (ref 5–15)
BUN: 45 mg/dL — ABNORMAL HIGH (ref 6–20)
CO2: 16 mmol/L — ABNORMAL LOW (ref 22–32)
Calcium: 10.5 mg/dL — ABNORMAL HIGH (ref 8.9–10.3)
Chloride: 113 mmol/L — ABNORMAL HIGH (ref 98–111)
Creatinine, Ser: 0.81 mg/dL (ref 0.44–1.00)
GFR, Estimated: >60 mL/min (ref >60)
Glucose, Bld: 91 mg/dL (ref 70–99)
Potassium: 3.6 mmol/L (ref 3.5–5.1)
Sodium: 135 mmol/L (ref 135–145)

## 2020-11-11 LAB — AEROBIC/ANAEROBIC CULTURE W GRAM STAIN (SURGICAL/DEEP WOUND)
Culture: NO GROWTH
Culture: NO GROWTH

## 2020-11-11 LAB — GLUCOSE, CAPILLARY
Glucose-Capillary: 91 mg/dL (ref 70–99)
Glucose-Capillary: 76 mg/dL (ref 70–99)
Glucose-Capillary: 91 mg/dL (ref 70–99)
Glucose-Capillary: 79 mg/dL (ref 70–99)

## 2020-11-11 MED ORDER — ASCORBIC ACID 500 MG PO TABS
500.0000 mg | ORAL_TABLET | Freq: Two times a day (BID) | ORAL | Status: DC
  Administered 2020-11-11 – 2020-11-15 (×3): 500 mg
  Filled 2020-11-11 (×2): qty 1

## 2020-11-11 MED ORDER — ZINC SULFATE 220 (50 ZN) MG PO CAPS
220.0000 mg | ORAL_CAPSULE | Freq: Every day | ORAL | Status: DC
  Administered 2020-11-11: 220 mg
  Filled 2020-11-11 (×6): qty 1

## 2020-11-11 MED ORDER — ACETAMINOPHEN 325 MG PO TABS: 650.0000 mg | ORAL_TABLET | Freq: Four times a day (QID) | ORAL | Status: DC | PRN

## 2020-11-11 MED ORDER — SODIUM CHLORIDE 0.9 % IV SOLN
100.0000 mg | INTRAVENOUS | Status: DC
  Administered 2020-11-12 – 2020-11-17 (×6): 100 mg via INTRAVENOUS
  Filled 2020-11-11 (×6): qty 100

## 2020-11-11 MED ORDER — POTASSIUM CHLORIDE 20 MEQ/15ML (10%) PO SOLN
40.0000 meq | Freq: Once | ORAL | Status: AC
  Administered 2020-11-11: 40 meq
  Filled 2020-11-11: qty 30

## 2020-11-11 MED ORDER — VITAMIN D 25 MCG (1000 UNIT) PO TABS
4000.0000 [IU] | ORAL_TABLET | Freq: Every day | ORAL | Status: DC
  Administered 2020-11-11: 4000 [IU]

## 2020-11-11 MED ORDER — METRONIDAZOLE 500 MG PO TABS
500.0000 mg | ORAL_TABLET | Freq: Three times a day (TID) | ORAL | Status: DC
  Administered 2020-11-11 – 2020-11-12 (×3): 500 mg
  Filled 2020-11-11 (×3): qty 1

## 2020-11-11 MED ORDER — BUSPIRONE HCL 5 MG PO TABS
10.0000 mg | ORAL_TABLET | Freq: Three times a day (TID) | ORAL | Status: DC
  Administered 2020-11-11: 10 mg

## 2020-11-11 MED ORDER — SODIUM CHLORIDE 0.9 % IV SOLN
200.0000 mg | Freq: Once | INTRAVENOUS | Status: AC
  Administered 2020-11-11: 200 mg via INTRAVENOUS
  Filled 2020-11-11: qty 200

## 2020-11-11 MED ORDER — METHOCARBAMOL 500 MG PO TABS: 500.0000 mg | ORAL_TABLET | Freq: Three times a day (TID) | ORAL | Status: DC | PRN

## 2020-11-11 MED ORDER — OXYCODONE HCL 5 MG PO TABS
2.5000 mg | ORAL_TABLET | ORAL | Status: DC | PRN
  Administered 2020-11-12 (×2): 5 mg
  Filled 2020-11-11 (×2): qty 1

## 2020-11-11 MED ORDER — GUAIFENESIN 100 MG/5ML PO SOLN: 15.0000 mL | Freq: Four times a day (QID) | ORAL | Status: DC | PRN

## 2020-11-11 NOTE — Progress Notes (Signed)
Pt refused for RN to place any medications down her cortrak. Pt asked nurse to not give her anymore medication, " It is too much medicine."  RN offered to delay medication times and give a little at a time, pt declined. RN offered pt to take meds by mouth, whole or crushed, pt said, "no." RN offered to have some meds changed to IV to decrease amount given per tube or orally, pt said, "no." Pt repeated again, " it is too much medicine." Pt told RN, "I do not want anymore medicine." RN offered afternoon medications 3x before documenting a refusal. RN ask pt if this conversation could be discuss with her daughter, pt agreed, RN attempted call to daughter x2 without success. However, a voicemail was left and no return call.   RN educated pt on importance of nutrition and medications. Pt continued to decline formula, and medications. RN asked pt about her depression and he will to live, pt remained silent and did not respond to RN. She became more teary eyed and would not make eye contact.   Pt may benefit from psych eval as depression ( previously diagnosed) may be severe and not allowing her to make appropiate decisions.   Oncoming staff made aware and both RN's (offgoing and oncoming) encouraged pt to take night medications. Pt declined again and said "no." She is ok with taking pain analgesics, but seem indecisive.
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
## 2020-11-11 NOTE — Progress Notes (Signed)
Daughter [name] called for update. RN gave updated on pt currently condition and any events overnight.

## 2020-11-11 NOTE — Progress Notes (Signed)
Pt refuse breakfast this morning. Pt stated, "not now" x3.

## 2020-11-12 DIAGNOSIS — J869 Pyothorax without fistula: Secondary | ICD-10-CM | POA: Diagnosis not present

## 2020-11-12 DIAGNOSIS — F329 Major depressive disorder, single episode, unspecified: Secondary | ICD-10-CM | POA: Diagnosis not present

## 2020-11-12 DIAGNOSIS — J90 Pleural effusion, not elsewhere classified: Secondary | ICD-10-CM | POA: Diagnosis not present

## 2020-11-12 DIAGNOSIS — L02211 Cutaneous abscess of abdominal wall: Secondary | ICD-10-CM | POA: Diagnosis not present

## 2020-11-12 DIAGNOSIS — D62 Acute posthemorrhagic anemia: Secondary | ICD-10-CM | POA: Diagnosis not present

## 2020-11-12 DIAGNOSIS — T07XXXA Unspecified multiple injuries, initial encounter: Secondary | ICD-10-CM | POA: Diagnosis not present

## 2020-11-12 DIAGNOSIS — M869 Osteomyelitis, unspecified: Secondary | ICD-10-CM | POA: Diagnosis not present

## 2020-11-12 LAB — AEROBIC/ANAEROBIC CULTURE W GRAM STAIN (SURGICAL/DEEP WOUND): Special Requests: NORMAL

## 2020-11-12 LAB — GLUCOSE, CAPILLARY
Glucose-Capillary: 108 mg/dL — ABNORMAL HIGH (ref 70–99)
Glucose-Capillary: 79 mg/dL (ref 70–99)
Glucose-Capillary: 79 mg/dL (ref 70–99)
Glucose-Capillary: 81 mg/dL (ref 70–99)
Glucose-Capillary: 81 mg/dL (ref 70–99)
Glucose-Capillary: 87 mg/dL (ref 70–99)

## 2020-11-12 MED ORDER — VANCOMYCIN HCL IN DEXTROSE 1-5 GM/200ML-% IV SOLN: 1000.0000 mg | INTRAVENOUS | Status: DC

## 2020-11-12 MED ORDER — PROCHLORPERAZINE EDISYLATE 10 MG/2ML IJ SOLN
10.0000 mg | Freq: Four times a day (QID) | INTRAMUSCULAR | Status: DC | PRN
  Administered 2020-11-17 – 2020-11-25 (×3): 10 mg via INTRAVENOUS
  Filled 2020-11-12 (×3): qty 2
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
## 2020-11-12 NOTE — Progress Notes (Signed)
Patient allows RN to admin some oral medications, turn, and provide some care. Patient appears sad. Emotional support provided, pt able to expresses what she likes and dislikes. She follows commands during assessment and denies any pain at this time.  Will continue to monitor.
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
## 2020-11-12 NOTE — Progress Notes (Signed)
1930- this RN and offgoing RN in room for bedside shift report, both RNs encourage pt to take nighttime medications. Pt continues to decline all medication at this time stating "no." 2105- nighttime medications offered to pt again at 2000 (during shift assessment) as well as at this time. Pt educated on importance of taking medications ordered but continues to decline stating "it's too much." Pt denies pain and refuses offered IV pain medication. 0000- Pt complains of pain and is agreeable to taking oxycodone as ordered (see eMAR). Pt also agrees to new IV placement to receive IV pain medication. Consult for IV team ordered. 0500- Pt repositioned to left side and complains of pain, is agreeable to taking IV Morphine at this time. Pt offered ordered Flagyl at this time as well, Pt refuses and shakes her head no.

## 2020-11-12 NOTE — Progress Notes (Signed)
Spoke with daughter at this time via phone and patients refusal of all medications relayed to daughter. Daughter expresses that she has plans to travel out of town tomorrow, but states she will try to visit patient. Daughter also states "if I can't make it in tomorrow, I will send someone in to talk with her (the patient)"

## 2020-11-13 ENCOUNTER — Inpatient Hospital Stay (HOSPITAL_COMMUNITY): Payer: Medicaid Other

## 2020-11-13 DIAGNOSIS — D62 Acute posthemorrhagic anemia: Secondary | ICD-10-CM | POA: Diagnosis not present

## 2020-11-13 DIAGNOSIS — F329 Major depressive disorder, single episode, unspecified: Secondary | ICD-10-CM | POA: Diagnosis not present

## 2020-11-13 DIAGNOSIS — J90 Pleural effusion, not elsewhere classified: Secondary | ICD-10-CM | POA: Diagnosis not present

## 2020-11-13 LAB — BASIC METABOLIC PANEL
Anion gap: 8 (ref 5–15)
BUN: 31 mg/dL — ABNORMAL HIGH (ref 6–20)
CO2: 19 mmol/L — ABNORMAL LOW (ref 22–32)
Calcium: 10.7 mg/dL — ABNORMAL HIGH (ref 8.9–10.3)
Chloride: 111 mmol/L (ref 98–111)
Creatinine, Ser: 0.77 mg/dL (ref 0.44–1.00)
GFR, Estimated: >60 mL/min (ref >60)
Glucose, Bld: 91 mg/dL (ref 70–99)
Potassium: 4 mmol/L (ref 3.5–5.1)
Sodium: 138 mmol/L (ref 135–145)

## 2020-11-13 LAB — GLUCOSE, CAPILLARY
Glucose-Capillary: 100 mg/dL — ABNORMAL HIGH (ref 70–99)
Glucose-Capillary: 102 mg/dL — ABNORMAL HIGH (ref 70–99)
Glucose-Capillary: 126 mg/dL — ABNORMAL HIGH (ref 70–99)
Glucose-Capillary: 72 mg/dL (ref 70–99)
Glucose-Capillary: 78 mg/dL (ref 70–99)
Glucose-Capillary: 86 mg/dL (ref 70–99)
Glucose-Capillary: 88 mg/dL (ref 70–99)

## 2020-11-13 MED ORDER — SODIUM CHLORIDE 0.9 % IV SOLN
3.0000 g | Freq: Four times a day (QID) | INTRAVENOUS | Status: DC
  Administered 2020-11-13 – 2020-11-28 (×56): 3 g via INTRAVENOUS
  Filled 2020-11-13 (×3): qty 3
  Filled 2020-11-13: qty 8
  Filled 2020-11-13 (×2): qty 3
  Filled 2020-11-13 (×2): qty 8
  Filled 2020-11-13: qty 3
  Filled 2020-11-13 (×3): qty 8
  Filled 2020-11-13 (×2): qty 3
  Filled 2020-11-13 (×3): qty 8
  Filled 2020-11-13 (×11): qty 3
  Filled 2020-11-13: qty 8
  Filled 2020-11-13 (×2): qty 3
  Filled 2020-11-13 (×3): qty 8
  Filled 2020-11-13: qty 3
  Filled 2020-11-13: qty 8
  Filled 2020-11-13: qty 3
  Filled 2020-11-13: qty 8
  Filled 2020-11-13 (×2): qty 3
  Filled 2020-11-13: qty 8
  Filled 2020-11-13: qty 3
  Filled 2020-11-13: qty 8
  Filled 2020-11-13 (×2): qty 3
  Filled 2020-11-13: qty 8
  Filled 2020-11-13: qty 3
  Filled 2020-11-13 (×2): qty 8
  Filled 2020-11-13: qty 3
  Filled 2020-11-13 (×2): qty 8
  Filled 2020-11-13 (×2): qty 3
  Filled 2020-11-13: qty 8
  Filled 2020-11-13 (×2): qty 3
  Filled 2020-11-13 (×4): qty 8
  Filled 2020-11-13 (×2): qty 3
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
## 2020-11-13 NOTE — Consult Note (Addendum)
WOC Nurse wound follow up Patient receiving care in [facility] [id number]. Wound type: surgical abdominal wound Measurement: Right tunnel measures 2.5 cm ; left tunnel measures 4.2 cm Wound bed: 100% pink Drainage (amount, consistency, odor) serous in cannister Periwound: intact Dressing procedure/placement/frequency: All pieces of white and black foam removed. A narrow strip of white foam placed into each tunnel; wound bed covered with white foam, topped with black foam, connected to suction, immediate seal obtained. Patient tolerated well, kept her eyes open and interacted with me during care. Additional VAC supplies at bedside.  On a side note, the draining puncture site I found on Friday appears healed over at this time.  WOC Nurse ostomy follow up Stoma type/location: LLQ colostomy Stomal assessment/size: skin barrier cut to 1 1/4 inch. MCS from 3 to 6 o'clock. The deepest point is at 3 o'clock and measures 4.1 cm.  A wide piece of Aquacel was tucked into the MCS pocket, barrier ring applied around site, pouch snapped on. Peristomal assessment: intact Treatment options for stomal/peristomal skin: barrier ring.  Bedside drainage bag removed; high output pouch discontinued.  Output: thin, brown Ostomy pouching: 2pc. 2 and 3/4 inch barrier and pouch [place] # 2 for barrier, 649 for pouch--supplies at bedside) Education provided: none Enrolled patient in [place] Secure Start Discharge program: No Sara Muster, RN, MSN, [employer], CNS-BC, pager [phone]
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
## 2020-11-14 DIAGNOSIS — Z515 Encounter for palliative care: Secondary | ICD-10-CM

## 2020-11-14 DIAGNOSIS — D62 Acute posthemorrhagic anemia: Secondary | ICD-10-CM | POA: Diagnosis not present

## 2020-11-14 DIAGNOSIS — M869 Osteomyelitis, unspecified: Secondary | ICD-10-CM | POA: Diagnosis not present

## 2020-11-14 DIAGNOSIS — R531 Weakness: Secondary | ICD-10-CM

## 2020-11-14 DIAGNOSIS — F339 Major depressive disorder, recurrent, unspecified: Secondary | ICD-10-CM | POA: Diagnosis not present

## 2020-11-14 DIAGNOSIS — J90 Pleural effusion, not elsewhere classified: Secondary | ICD-10-CM | POA: Diagnosis not present

## 2020-11-14 DIAGNOSIS — T07XXXA Unspecified multiple injuries, initial encounter: Secondary | ICD-10-CM | POA: Diagnosis not present

## 2020-11-14 DIAGNOSIS — F329 Major depressive disorder, single episode, unspecified: Secondary | ICD-10-CM | POA: Diagnosis not present

## 2020-11-14 LAB — GLUCOSE, CAPILLARY
Glucose-Capillary: 79 mg/dL (ref 70–99)
Glucose-Capillary: 103 mg/dL — ABNORMAL HIGH (ref 70–99)
Glucose-Capillary: 116 mg/dL — ABNORMAL HIGH (ref 70–99)
Glucose-Capillary: 95 mg/dL (ref 70–99)

## 2020-11-14 MED ORDER — BENEPROTEIN PO POWD
1.0000 | Freq: Three times a day (TID) | ORAL | Status: DC
  Filled 2020-11-14: qty 227

## 2020-11-14 MED ORDER — BOOST / RESOURCE BREEZE PO LIQD CUSTOM
1.0000 | Freq: Four times a day (QID) | ORAL | Status: DC
  Administered 2020-11-18 – 2020-12-08 (×4): 1 via ORAL
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
## 2020-11-15 ENCOUNTER — Inpatient Hospital Stay (HOSPITAL_COMMUNITY): Payer: Medicaid Other

## 2020-11-15 DIAGNOSIS — M868X8 Other osteomyelitis, other site: Secondary | ICD-10-CM | POA: Diagnosis not present

## 2020-11-15 DIAGNOSIS — J90 Pleural effusion, not elsewhere classified: Secondary | ICD-10-CM | POA: Diagnosis not present

## 2020-11-15 DIAGNOSIS — R531 Weakness: Secondary | ICD-10-CM

## 2020-11-15 DIAGNOSIS — F329 Major depressive disorder, single episode, unspecified: Secondary | ICD-10-CM | POA: Diagnosis not present

## 2020-11-15 DIAGNOSIS — D62 Acute posthemorrhagic anemia: Secondary | ICD-10-CM | POA: Diagnosis not present

## 2020-11-15 DIAGNOSIS — D72829 Elevated white blood cell count, unspecified: Secondary | ICD-10-CM | POA: Diagnosis not present

## 2020-11-15 DIAGNOSIS — L02211 Cutaneous abscess of abdominal wall: Secondary | ICD-10-CM | POA: Diagnosis not present

## 2020-11-15 DIAGNOSIS — J869 Pyothorax without fistula: Secondary | ICD-10-CM | POA: Diagnosis not present

## 2020-11-15 DIAGNOSIS — B379 Candidiasis, unspecified: Secondary | ICD-10-CM | POA: Diagnosis not present

## 2020-11-15 DIAGNOSIS — Z515 Encounter for palliative care: Secondary | ICD-10-CM

## 2020-11-15 HISTORY — PX: IR CATHETER TUBE CHANGE: IMG717

## 2020-11-15 LAB — CBC
HCT: 31 % — ABNORMAL LOW (ref 36.0–46.0)
Hemoglobin: 9.4 g/dL — ABNORMAL LOW (ref 12.0–15.0)
MCH: 30.5 pg (ref 26.0–34.0)
MCHC: 30.3 g/dL (ref 30.0–36.0)
MCV: 100.6 fL — ABNORMAL HIGH (ref 80.0–100.0)
Platelets: 361 10*3/uL (ref 150–400)
RBC: 3.08 MIL/uL — ABNORMAL LOW (ref 3.87–5.11)
RDW: 20.3 % — ABNORMAL HIGH (ref 11.5–15.5)
WBC: 14.7 10*3/uL — ABNORMAL HIGH (ref 4.0–10.5)
nRBC: 0 % (ref 0.0–0.2)

## 2020-11-15 LAB — BASIC METABOLIC PANEL
Anion gap: 6 (ref 5–15)
Anion gap: 7 (ref 5–15)
BUN: 18 mg/dL (ref 6–20)
BUN: 16 mg/dL (ref 6–20)
CO2: 19 mmol/L — ABNORMAL LOW (ref 22–32)
CO2: 19 mmol/L — ABNORMAL LOW (ref 22–32)
Calcium: 10 mg/dL (ref 8.9–10.3)
Calcium: 9.7 mg/dL (ref 8.9–10.3)
Chloride: 112 mmol/L — ABNORMAL HIGH (ref 98–111)
Chloride: 112 mmol/L — ABNORMAL HIGH (ref 98–111)
Creatinine, Ser: 0.73 mg/dL (ref 0.44–1.00)
Creatinine, Ser: 0.84 mg/dL (ref 0.44–1.00)
GFR, Estimated: >60 mL/min (ref >60)
GFR, Estimated: >60 mL/min (ref >60)
Glucose, Bld: 101 mg/dL — ABNORMAL HIGH (ref 70–99)
Glucose, Bld: 96 mg/dL (ref 70–99)
Potassium: 2.8 mmol/L — ABNORMAL LOW (ref 3.5–5.1)
Potassium: 4.1 mmol/L (ref 3.5–5.1)
Sodium: 137 mmol/L (ref 135–145)
Sodium: 138 mmol/L (ref 135–145)

## 2020-11-15 LAB — GLUCOSE, CAPILLARY
Glucose-Capillary: 94 mg/dL (ref 70–99)
Glucose-Capillary: 78 mg/dL (ref 70–99)
Glucose-Capillary: 85 mg/dL (ref 70–99)
Glucose-Capillary: 74 mg/dL (ref 70–99)
Glucose-Capillary: 93 mg/dL (ref 70–99)
Glucose-Capillary: 85 mg/dL (ref 70–99)

## 2020-11-15 LAB — MAGNESIUM: Magnesium: 1.7 mg/dL (ref 1.7–2.4)

## 2020-11-15 MED ORDER — FENTANYL CITRATE (PF) 100 MCG/2ML IJ SOLN
INTRAMUSCULAR | Status: AC | PRN
Start: 2020-11-15 — End: 2020-11-15
  Administered 2020-11-15: 25 ug via INTRAVENOUS

## 2020-11-15 MED ORDER — LIDOCAINE HCL 1 % IJ SOLN
INTRAMUSCULAR | Status: AC
  Filled 2020-11-15: qty 20

## 2020-11-15 MED ORDER — POTASSIUM CHLORIDE 10 MEQ/100ML IV SOLN
10.0000 meq | Freq: Once | INTRAVENOUS | Status: AC
  Administered 2020-11-15: 10 meq via INTRAVENOUS
  Filled 2020-11-15: qty 100

## 2020-11-15 MED ORDER — FENTANYL CITRATE (PF) 100 MCG/2ML IJ SOLN
INTRAMUSCULAR | Status: AC
  Filled 2020-11-15: qty 2

## 2020-11-15 MED ORDER — MIDAZOLAM HCL 2 MG/2ML IJ SOLN
INTRAMUSCULAR | Status: AC | PRN
  Administered 2020-11-15: 1 mg via INTRAVENOUS

## 2020-11-15 MED ORDER — MIDAZOLAM HCL 2 MG/2ML IJ SOLN
INTRAMUSCULAR | Status: AC
  Filled 2020-11-15: qty 2

## 2020-11-15 MED ORDER — LIDOCAINE HCL 1 % IJ SOLN
INTRAMUSCULAR | Status: AC | PRN
  Administered 2020-11-15: 20 mL via INTRADERMAL

## 2020-11-15 MED ORDER — POTASSIUM CHLORIDE 10 MEQ/100ML IV SOLN
10.0000 meq | INTRAVENOUS | Status: AC
  Administered 2020-11-15 (×4): 10 meq via INTRAVENOUS
  Filled 2020-11-15 (×4): qty 100

## 2020-11-15 NOTE — Progress Notes (Signed)
SLP Cancellation Note  Patient Details Name: Sara [name] MRN: [id number] DOB: March 01, 1966   Cancelled treatment:       Reason Eval/Treat Not Completed: Medical issues which prohibited therapy. Pt currently NPO pending procedure today. Will f/u as able.   Mahala Menghini., M.A. CCC-SLP Acute Rehabilitation Services Pager [phone] Office [phone]  11/15/2020, 9:27 AM
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
## 2020-11-15 NOTE — Progress Notes (Signed)
PT Cancellation Note  Patient Details Name: Sara [name] MRN: [id number] DOB: June 25, 1966   Cancelled Treatment:    Reason Eval/Treat Not Completed: Patient at procedure or test/unavailable - will check back as schedule allows.  Richrd Sox, PT Acute Rehabilitation Services Pager [phone]  Office [phone]     Nicola Police 11/15/2020, 3:17 PM
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
## 2020-11-15 NOTE — Plan of Care (Signed)
  Problem: Education: Goal: Knowledge of General Education information will improve Description: Including pain rating scale, medication(s)/side effects and non-pharmacologic comfort measures Outcome: Not Progressing   

## 2020-11-15 NOTE — Progress Notes (Signed)
Pt is requesting that we remove her cortrak while she is sedated. I have notified her RN on floor what we will need order from MD to do this and I am awaiting chat/phone call back/order to confirm this.

## 2020-11-15 NOTE — Progress Notes (Signed)
9 Days Post-Op Procedure(s) (LRB): IRRIGATION AND DEBRIDEMENT EXTREMITY (Left) Subjective: Plan for L chest tube exchange by IR noted Will follow up post procedure imaging Current pigtail > 100cc /day Objective: Vital signs in last 24 hours: Temp:  [97.6 F (36.4 C)-98.8 F (37.1 C)] 98.3 F (36.8 C) (11/17 0735) Pulse Rate:  [88-98] 89 (11/17 0735) Cardiac Rhythm: Normal sinus rhythm (11/17 0700) Resp:  [16] 16 (11/17 0735) BP: (110-130)/(53-76) 130/66 (11/17 0735) SpO2:  [96 %-100 %] 96 % (11/17 0735)  Hemodynamic parameters for last 24 hours:    Intake/Output from previous day: 11/16 0701 - 11/17 0700 In: 440 [P.O.:240; IV Piggyback:200] Out: 1745 [Urine:750; Drains:220; Stool:650; Chest Tube:125] Intake/Output this shift: No intake/output data recorded.    Lab Results: Recent Labs    11/15/20 0130  WBC 14.7*  HGB 9.4*  HCT 31.0*  PLT 361   BMET:  Recent Labs    11/13/20 0954 11/15/20 0130  NA 138 137  K 4.0 2.8*  CL 111 112*  CO2 19* 19*  GLUCOSE 91 101*  BUN 31* 18  CREATININE 0.77 0.73  CALCIUM 10.7* 10.0    PT/INR: No results for input(s): LABPROT, INR in the last 72 hours. ABG    Component Value Date/Time   PHART 7.348 (L) 10/05/2020 0826   HCO3 25.2 10/05/2020 0826   TCO2 27 10/05/2020 0826   ACIDBASEDEF 1.0 10/05/2020 0826   O2SAT 84.0 10/05/2020 0826   CBG (last 3)  Recent Labs    11/14/20 2321 11/15/20 0422 11/15/20 0737  GLUCAP 95 78 85    Assessment/Plan: S/P Procedure(s) (LRB): IRRIGATION AND DEBRIDEMENT EXTREMITY (Left) L posterior loculated empyema, too small for VATS Appreciate IR plan for tube exchange   LOS: 54 days    Sara [name] 11/15/2020

## 2020-11-15 NOTE — Procedures (Signed)
  Procedure: Exchange/upsize/revision of L chest tube 43f Thal EBL:   minimal Complications:  none immediate  See full dictation in [employer].  Thora Lance MD Main # [phone] Pager  [phone] Mobile [phone]

## 2020-11-15 NOTE — Consult Note (Signed)
WOC Nurse wound follow up Patient receiving care in [facility] 4N14 Wound type: Surgical abdominal wound Measurement: Right tunnel measures 2.5 cm; left tunnel measures 4.2 cm; midline wound measures 9 cm x 5.3 cm  Wound bed: 100% pink Drainage (amount, consistency, odor) Serous in canister.  Periwound: intact Dressing procedure/placement/frequency: All pieces of white and black foam removed. A narrow strip of white foam placed into each tunnel and around edges of wound;  wound bed covered with white foam, topped with black foam, connected to suction, immediate seal obtained. Additional VAC supplies at bedside.  WOC Nurse ostomy follow up Stoma type/location: LLQ colostomy Stomal assessment/size: skin barrier cut to 1 1/4 inch,mucocutaneous separation from 3 to 6 o'clock. The deepest point is from 3 o'clock and measures 4 cm.  A wide piece of Aquacel was tucked into the mucocutaneous separation pocket. 2 piece pouch applied without skin barrier as none was in the room.  Peristomal assessment: intact Treatment options for stomal/peristomal skin: barrier ring.  Bedside drainage bag removed; high output pouch discontinued.  Output: thin, brown Ostomy pouching: 2pc. 2 and 3/4 inch barrier and pouch [place] # 2 for barrier, 649 for pouch--supplies at bedside)  Patient tolerated procedure well with interaction during dressing and pouch change.   Renaldo Reel Katrinka Blazing, MSN, RN, CMSRN, Angus Seller, [facility] Wound Treatment Associate Pager [phone]
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
## 2020-11-16 ENCOUNTER — Inpatient Hospital Stay (HOSPITAL_COMMUNITY): Payer: Medicaid Other

## 2020-11-16 DIAGNOSIS — F329 Major depressive disorder, single episode, unspecified: Secondary | ICD-10-CM | POA: Diagnosis not present

## 2020-11-16 DIAGNOSIS — F339 Major depressive disorder, recurrent, unspecified: Secondary | ICD-10-CM | POA: Diagnosis not present

## 2020-11-16 DIAGNOSIS — Z515 Encounter for palliative care: Secondary | ICD-10-CM | POA: Diagnosis not present

## 2020-11-16 DIAGNOSIS — M869 Osteomyelitis, unspecified: Secondary | ICD-10-CM | POA: Diagnosis not present

## 2020-11-16 DIAGNOSIS — D62 Acute posthemorrhagic anemia: Secondary | ICD-10-CM | POA: Diagnosis not present

## 2020-11-16 DIAGNOSIS — R531 Weakness: Secondary | ICD-10-CM | POA: Diagnosis not present

## 2020-11-16 DIAGNOSIS — J90 Pleural effusion, not elsewhere classified: Secondary | ICD-10-CM | POA: Diagnosis not present

## 2020-11-16 DIAGNOSIS — T07XXXA Unspecified multiple injuries, initial encounter: Secondary | ICD-10-CM | POA: Diagnosis not present

## 2020-11-16 LAB — GLUCOSE, CAPILLARY
Glucose-Capillary: 72 mg/dL (ref 70–99)
Glucose-Capillary: 74 mg/dL (ref 70–99)
Glucose-Capillary: 77 mg/dL (ref 70–99)
Glucose-Capillary: 78 mg/dL (ref 70–99)
Glucose-Capillary: 84 mg/dL (ref 70–99)
Glucose-Capillary: 89 mg/dL (ref 70–99)

## 2020-11-16 LAB — BASIC METABOLIC PANEL
Anion gap: 8 (ref 5–15)
BUN: 14 mg/dL (ref 6–20)
CO2: 20 mmol/L — ABNORMAL LOW (ref 22–32)
Calcium: 9.7 mg/dL (ref 8.9–10.3)
Chloride: 112 mmol/L — ABNORMAL HIGH (ref 98–111)
Creatinine, Ser: 0.77 mg/dL (ref 0.44–1.00)
GFR, Estimated: >60 mL/min (ref >60)
Glucose, Bld: 85 mg/dL (ref 70–99)
Potassium: 3.4 mmol/L — ABNORMAL LOW (ref 3.5–5.1)
Sodium: 140 mmol/L (ref 135–145)

## 2020-11-16 LAB — CBC
HCT: 31.3 % — ABNORMAL LOW (ref 36.0–46.0)
Hemoglobin: 9.4 g/dL — ABNORMAL LOW (ref 12.0–15.0)
MCH: 30.5 pg (ref 26.0–34.0)
MCHC: 30 g/dL (ref 30.0–36.0)
MCV: 101.6 fL — ABNORMAL HIGH (ref 80.0–100.0)
Platelets: 298 10*3/uL (ref 150–400)
RBC: 3.08 MIL/uL — ABNORMAL LOW (ref 3.87–5.11)
RDW: 20.1 % — ABNORMAL HIGH (ref 11.5–15.5)
WBC: 12 10*3/uL — ABNORMAL HIGH (ref 4.0–10.5)
nRBC: 0 % (ref 0.0–0.2)

## 2020-11-16 MED ORDER — ASCORBIC ACID 500 MG PO TABS
500.0000 mg | ORAL_TABLET | Freq: Two times a day (BID) | ORAL | Status: DC
  Administered 2020-11-17 – 2020-11-18 (×2): 500 mg via ORAL
  Filled 2020-11-16 (×11): qty 1

## 2020-11-16 MED ORDER — ZINC SULFATE 220 (50 ZN) MG PO CAPS
220.0000 mg | ORAL_CAPSULE | Freq: Every day | ORAL | Status: DC
  Administered 2020-11-17: 220 mg via ORAL
  Filled 2020-11-16 (×28): qty 1

## 2020-11-16 MED ORDER — AMPHETAMINE-DEXTROAMPHETAMINE 10 MG PO TABS
25.0000 mg | ORAL_TABLET | Freq: Two times a day (BID) | ORAL | Status: DC
  Administered 2020-11-17: 25 mg via ORAL
  Filled 2020-11-16 (×6): qty 3

## 2020-11-16 MED ORDER — METHOCARBAMOL 500 MG PO TABS
500.0000 mg | ORAL_TABLET | Freq: Three times a day (TID) | ORAL | Status: DC | PRN
  Administered 2020-11-16 – 2020-12-10 (×8): 500 mg via ORAL
  Filled 2020-11-16 (×8): qty 1

## 2020-11-16 MED ORDER — OXYCODONE HCL 5 MG PO TABS
2.5000 mg | ORAL_TABLET | ORAL | Status: DC | PRN
  Administered 2020-11-16 – 2020-12-14 (×57): 5 mg via ORAL
  Filled 2020-11-16 (×58): qty 1

## 2020-11-16 MED ORDER — PANTOPRAZOLE SODIUM 40 MG PO PACK
40.0000 mg | PACK | Freq: Every day | ORAL | Status: DC
  Administered 2020-11-17: 40 mg via ORAL
  Filled 2020-11-16 (×6): qty 20

## 2020-11-16 MED ORDER — GUAIFENESIN 100 MG/5ML PO SOLN: 15.0000 mL | Freq: Four times a day (QID) | ORAL | Status: DC | PRN

## 2020-11-16 MED ORDER — VITAMIN D 25 MCG (1000 UNIT) PO TABS
4000.0000 [IU] | ORAL_TABLET | Freq: Every day | ORAL | Status: DC
  Administered 2020-11-17: 4000 [IU] via ORAL
  Filled 2020-11-16 (×8): qty 4

## 2020-11-16 MED ORDER — MEGESTROL ACETATE 400 MG/10ML PO SUSP
400.0000 mg | Freq: Two times a day (BID) | ORAL | Status: DC
  Administered 2020-11-17: 400 mg via ORAL
  Filled 2020-11-16 (×36): qty 10

## 2020-11-16 MED ORDER — POTASSIUM CHLORIDE CRYS ER 20 MEQ PO TBCR
30.0000 meq | EXTENDED_RELEASE_TABLET | Freq: Two times a day (BID) | ORAL | Status: AC
  Administered 2020-11-16: 30 meq via ORAL
  Filled 2020-11-16 (×2): qty 1

## 2020-11-16 MED ORDER — ACETAMINOPHEN 325 MG PO TABS
650.0000 mg | ORAL_TABLET | Freq: Four times a day (QID) | ORAL | Status: DC | PRN
  Filled 2020-11-16: qty 2

## 2020-11-16 MED ORDER — FUROSEMIDE 10 MG/ML IJ SOLN
40.0000 mg | Freq: Once | INTRAMUSCULAR | Status: AC
  Administered 2020-11-16: 40 mg via INTRAVENOUS
  Filled 2020-11-16: qty 4

## 2020-11-16 MED ORDER — VORTIOXETINE HBR 20 MG PO TABS
20.0000 mg | ORAL_TABLET | Freq: Every day | ORAL | Status: DC
  Administered 2020-11-17 – 2020-12-14 (×26): 20 mg via ORAL
  Filled 2020-11-16 (×29): qty 1

## 2020-11-16 NOTE — TOC Progression Note (Signed)
Transition of Care [facility]) - Progression Note    Patient Details  Name: POET HINEMAN MRN: [id number] Date of Birth: [birth year]  Transition of Care [facility]) CM/SW Contact  Glennon Mac, RN Phone Number: 11/16/2020, 4:41 PM  Clinical Narrative:   Daughter's FMLA paperwork completed and faxed to her employer.     Expected Discharge Plan: Skilled Nursing Facility Barriers to Discharge: Continued Medical Work up  Expected Discharge Plan and Services Expected Discharge Plan: Skilled Nursing Facility   Discharge Planning Services: CM Consult   Living arrangements for the past 2 months: Single Family Home                                       Social Determinants of Health (SDOH) Interventions    Readmission Risk Interventions No flowsheet data found.  Quintella Baton, RN, BSN  Trauma/Neuro ICU Case Manager [phone]
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
## 2020-11-16 NOTE — Plan of Care (Signed)

## 2020-11-16 NOTE — Progress Notes (Signed)
Patient ID: Sara [name], female   DOB: 08-Aug-1966, 54 y.o.   MRN: [id number]  This NP visited patient at the bedside as a follow up to for palliative medicine needs and emotional support.  Created space and opportunity for patient to explore her thoughts and feelings regarding current medical situation.  This has been a difficult time both physically and psychologically.  Therapeutic listening and emotional support.  Education offered regarding the importance of patient participation and buy in to overall treatment plan, and the choices patients face regarding acceptance of medical interventions and the consequences of these choices.  She understands. Discussed with Dr Janee Morn;  in attempt to give Iver some control over her situation she is in agreement to take her anti-depressant mediations if given separately at 10:00 am, and removal of Cor-trac and calorie count.  Bari ultimately remains hopeful for  recovery and return home  I also spoke with  unit director Zachery Dakins regarding visitation.  Family have transportation/financial  issues and come from a distance.  She approves that Cintya can have more than one visitor a day. This is important to this patient well being.  Discussed with patient the importance of continued conversation with family and their  medical providers regarding overall plan of care and treatment options,  ensuring decisions are within the context of the patients values and GOCs.  Questions and concerns addressed   Discussed with Dr Janee Morn  Total time spent on the unit was 35 minutes  Greater than 50% of the time was spent in counseling and coordination of care  [name] NP  Palliative Medicine Team Team Phone # [phone] Pager [phone]
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
## 2020-11-16 NOTE — Progress Notes (Signed)
Patient refused all morning medications except for IV abx.

## 2020-11-16 NOTE — Progress Notes (Addendum)
      [address]       [place] [address]             [phone]       10 Days Post-Op Procedure(s) (LRB): IRRIGATION AND DEBRIDEMENT EXTREMITY (Left)  Subjective: Patient sleeping this am and briefly awakened. She does not have much pain at left pigtail chest tube site this am.  Objective: Vital signs in last 24 hours: Temp:  [97.7 F (36.5 C)-98.3 F (36.8 C)] 98.1 F (36.7 C) (11/18 0302) Pulse Rate:  [81-97] 87 (11/18 0500) Cardiac Rhythm: Normal sinus rhythm (11/17 1901) Resp:  [12-19] 18 (11/17 1642) BP: (110-155)/(55-76) 155/76 (11/18 0302) SpO2:  [95 %-100 %] 96 % (11/18 0500) Weight:  [93.7 kg] 93.7 kg (11/18 0500)      Intake/Output from previous day: 11/17 0701 - 11/18 0700 In: 418.8 [IV Piggyback:378.8] Out: 1430 [Urine:1100; Drains:40; Stool:200; Chest Tube:90]   Physical Exam:  Cardiovascular: RRR Pulmonary: Clear to auscultation on the right and diminished left base Wounds: Dressing is clean and dry.   Left chest Tube: to suction and there is no air leak.  Lab Results: HWE:XHBZJI Labs    11/15/20 0130  WBC 14.7*  HGB 9.4*  HCT 31.0*  PLT 361   BMET:  Recent Labs    11/15/20 0130 11/15/20 2116  NA 137 138  K 2.8* 4.1  CL 112* 112*  CO2 19* 19*  GLUCOSE 101* 96  BUN 18 16  CREATININE 0.73 0.84  CALCIUM 10.0 9.7    PT/INR: No results for input(s): LABPROT, INR in the last 72 hours. ABG:  INR: Will add last result for INR, ABG once components are confirmed Will add last 4 CBG results once components are confirmed  Assessment/Plan:  1. CV - SR 2.  Pulmonary - Loculated, small left abscess/empyema. S/p pigtail exchange of left chest tube by IR. Drainage 90 cc last 12 hours. CXR this am shows left pleural thickening or loculated lateral pleural fluid appears unchanged and mild associated left-sided volume loss is stable. 3. ID-Leukocytosis-WBC this am 14,700. On Unasyn for  left abscess/empyema. LLQ abd wall  abscess showed rare Staph Aureus and abundant Candida Glabrata and Bacterioids Fragilis.  Donielle M ZimmermanPA-C 11/16/2020,7:10 AM [phone]  CXR today is improved with new Chest drain by IR Cont suction on drain  patient examined and medical record reviewed,agree with above note. [name] 11/16/2020

## 2020-11-17 ENCOUNTER — Inpatient Hospital Stay (HOSPITAL_COMMUNITY): Payer: Medicaid Other

## 2020-11-17 DIAGNOSIS — J869 Pyothorax without fistula: Secondary | ICD-10-CM | POA: Diagnosis not present

## 2020-11-17 DIAGNOSIS — L02211 Cutaneous abscess of abdominal wall: Secondary | ICD-10-CM | POA: Diagnosis not present

## 2020-11-17 DIAGNOSIS — J90 Pleural effusion, not elsewhere classified: Secondary | ICD-10-CM | POA: Diagnosis not present

## 2020-11-17 DIAGNOSIS — B379 Candidiasis, unspecified: Secondary | ICD-10-CM | POA: Diagnosis not present

## 2020-11-17 DIAGNOSIS — M868X8 Other osteomyelitis, other site: Secondary | ICD-10-CM | POA: Diagnosis not present

## 2020-11-17 DIAGNOSIS — D72829 Elevated white blood cell count, unspecified: Secondary | ICD-10-CM | POA: Diagnosis not present

## 2020-11-17 DIAGNOSIS — D62 Acute posthemorrhagic anemia: Secondary | ICD-10-CM | POA: Diagnosis not present

## 2020-11-17 DIAGNOSIS — R609 Edema, unspecified: Secondary | ICD-10-CM

## 2020-11-17 DIAGNOSIS — F329 Major depressive disorder, single episode, unspecified: Secondary | ICD-10-CM | POA: Diagnosis not present

## 2020-11-17 LAB — BASIC METABOLIC PANEL
Anion gap: 11 (ref 5–15)
BUN: 10 mg/dL (ref 6–20)
CO2: 19 mmol/L — ABNORMAL LOW (ref 22–32)
Calcium: 9.2 mg/dL (ref 8.9–10.3)
Chloride: 108 mmol/L (ref 98–111)
Creatinine, Ser: 0.75 mg/dL (ref 0.44–1.00)
GFR, Estimated: >60 mL/min (ref >60)
Glucose, Bld: 99 mg/dL (ref 70–99)
Potassium: 3.8 mmol/L (ref 3.5–5.1)
Sodium: 138 mmol/L (ref 135–145)

## 2020-11-17 LAB — GLUCOSE, CAPILLARY
Glucose-Capillary: 104 mg/dL — ABNORMAL HIGH (ref 70–99)
Glucose-Capillary: 67 mg/dL — ABNORMAL LOW (ref 70–99)
Glucose-Capillary: 75 mg/dL (ref 70–99)
Glucose-Capillary: 80 mg/dL (ref 70–99)
Glucose-Capillary: 87 mg/dL (ref 70–99)
Glucose-Capillary: 92 mg/dL (ref 70–99)

## 2020-11-17 MED ORDER — POTASSIUM CHLORIDE CRYS ER 20 MEQ PO TBCR
40.0000 meq | EXTENDED_RELEASE_TABLET | Freq: Once | ORAL | Status: AC
  Administered 2020-11-17: 40 meq via ORAL
  Filled 2020-11-17: qty 2

## 2020-11-17 MED ORDER — FUROSEMIDE 10 MG/ML IJ SOLN
40.0000 mg | Freq: Once | INTRAMUSCULAR | Status: AC
  Administered 2020-11-17: 40 mg via INTRAVENOUS
  Filled 2020-11-17: qty 4

## 2020-11-17 MED ORDER — FLUCONAZOLE 200 MG PO TABS
800.0000 mg | ORAL_TABLET | Freq: Every day | ORAL | Status: AC
  Administered 2020-11-21 – 2020-11-27 (×5): 800 mg via ORAL
  Filled 2020-11-17 (×12): qty 4

## 2020-11-17 NOTE — Progress Notes (Signed)
Lower extremity venous bilateral study completed.   Please see CV Proc for preliminary results.   Margurete Guaman, RDMS  
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
## 2020-11-17 NOTE — Progress Notes (Signed)
Blood sugar recheck 109.
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
## 2020-11-17 NOTE — Consult Note (Signed)
WOC Nurse wound follow up Patient receiving care in [facility] 4N14.  Patient with limited interaction today secondary to feeling nauseated. Wound type: surgical full thickness abdominal wound Measurement: right tunnel measures 2.3 cm; left tunnel measures 3.9 cm Wound bed: 100% pink Drainage (amount, consistency, odor) none in existing VAC cannister Periwound: intact Dressing procedure/placement/frequency: All pieces of white and black foam removed from tunnels and wound bed. One piece of white foam inserted into each tunnel, tail of foam extending into wound bed.  Wound bed covered in white foam, topped with black foam, drape applied, immediate seal obtained. Patient tolerated well.  WOC Nurse ostomy follow up Stoma type/location: LLQ colostomy Stomal assessment/size: opening to skin barrier approximately 1 1/4 inches with MCS from 3 - 5 o'clock; deepest area at 4 o'clock and depth there measured 3.8 cm. Old Aquacel to MCS pocket removed, new large piece placed and brought through the newly placed skin barrier and barrier ring, and allowed to cascade into new pouch. Peristomal assessment: intact Treatment options for stomal/peristomal skin: barrier ring, aquacel Output: 225 thin brown  Ostomy pouching: 2pc. 2 and 3/4 inch.  Now that output is increasing, may need to switch back to high output connected to bedside drainage bag. Education provided: none Enrolled patient in [place] Secure Start Discharge program: No VAC supplies for change next Monday in room, as well as additional ostomy supplies. Helmut Muster, RN, MSN, CWOCN, CNS-BC, pager [phone]

## 2020-11-17 NOTE — Progress Notes (Addendum)
PHARMACY CONSULT NOTE FOR:  OUTPATIENT  PARENTERAL ANTIBIOTIC THERAPY (OPAT)  Indication: Osteomyelitis  Regimen: Zosyn 10.125g daily as a continuous infusion End date: 12/18/2020 IV antibiotic discharge orders are pended. To discharging provider:  please sign these orders via discharge navigator,  Select New Orders & click on the button choice - Manage This Unsigned Work.     Thank you for allowing pharmacy to be a part of this patient's care.  Jeannetta Nap 11/17/2020, 4:27 PM

## 2020-11-17 NOTE — Progress Notes (Signed)
Pt has blood sugar of 67. Will give juice and fruit cup and recheck. Pt is asypmtomatic.
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
## 2020-11-17 NOTE — Progress Notes (Signed)
ID Brief Note  Afebrile, mild leukocytosis from yesterday, Overall clinically stable Patient is s/p Left chest wall tube upsize/exchange by VIR on 11/15/20  Candida glabrata is susceptible to Fluconazole ( dose dependent)  Continue Unasyn as is for concerns of Left distal femur Osteomyelitis along with left lung empyema and left abdominal wall superficial abscess ( s/p drain). Duration would be 6 weeks from date of last OR on 11/8.  Anidulafungin can be de-escalated to Fluconazole (875m daily) . Duration would be 2 weeks from date of left sided chest tube exchange on 11/17  Monitor CBC, CMP on IV abx Weekly ESR and CRP  Drain management per VIR Patient seems to be going to SNF upon discharge.  Please let [place] if she is going to SNF and will make a follow up in [facility] for check on her  labs/follow up on osteomyelitis given her long term need for IV abx   Will sign off for now  Call [facility] with questions or concerns   [facility]
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
## 2020-11-18 DIAGNOSIS — J90 Pleural effusion, not elsewhere classified: Secondary | ICD-10-CM | POA: Diagnosis not present

## 2020-11-18 DIAGNOSIS — D62 Acute posthemorrhagic anemia: Secondary | ICD-10-CM | POA: Diagnosis not present

## 2020-11-18 DIAGNOSIS — F329 Major depressive disorder, single episode, unspecified: Secondary | ICD-10-CM | POA: Diagnosis not present

## 2020-11-18 LAB — BASIC METABOLIC PANEL
Anion gap: 8 (ref 5–15)
BUN: 8 mg/dL (ref 6–20)
CO2: 24 mmol/L (ref 22–32)
Calcium: 9.2 mg/dL (ref 8.9–10.3)
Chloride: 109 mmol/L (ref 98–111)
Creatinine, Ser: 0.66 mg/dL (ref 0.44–1.00)
GFR, Estimated: >60 mL/min (ref >60)
Glucose, Bld: 79 mg/dL (ref 70–99)
Potassium: 2.8 mmol/L — ABNORMAL LOW (ref 3.5–5.1)
Sodium: 141 mmol/L (ref 135–145)

## 2020-11-18 MED ORDER — POTASSIUM CHLORIDE 20 MEQ/15ML (10%) PO SOLN
40.0000 meq | ORAL | Status: AC
  Filled 2020-11-18: qty 30

## 2020-11-18 NOTE — Progress Notes (Signed)
Patient still refusing most po medications, and now refusing blood sugar checks.  Nashia Remus, Kae Heller, RN

## 2020-11-18 NOTE — Progress Notes (Signed)
Notified Dr. Bedelia Person of patient's refusal to take po meds including potassium and requested for PO potassium be changed to IV. According to pharmacy and protocol only the MD can change the route and dosage from PO to IV. Dr. Bedelia Person notified. No new orders. Reported off to oncoming nurse. Patient states she will not take po.
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
## 2020-11-18 NOTE — Progress Notes (Signed)
Notified Dr on call of K+ level new orders put in, waiting for pharmacy to profile.  Chany Woolworth, Kae Heller, RN

## 2020-11-19 ENCOUNTER — Inpatient Hospital Stay (HOSPITAL_COMMUNITY): Payer: Medicaid Other

## 2020-11-19 DIAGNOSIS — Z515 Encounter for palliative care: Secondary | ICD-10-CM | POA: Diagnosis not present

## 2020-11-19 DIAGNOSIS — M869 Osteomyelitis, unspecified: Secondary | ICD-10-CM | POA: Diagnosis not present

## 2020-11-19 DIAGNOSIS — J90 Pleural effusion, not elsewhere classified: Secondary | ICD-10-CM | POA: Diagnosis not present

## 2020-11-19 DIAGNOSIS — T07XXXA Unspecified multiple injuries, initial encounter: Secondary | ICD-10-CM | POA: Diagnosis not present

## 2020-11-19 DIAGNOSIS — D62 Acute posthemorrhagic anemia: Secondary | ICD-10-CM | POA: Diagnosis not present

## 2020-11-19 DIAGNOSIS — R531 Weakness: Secondary | ICD-10-CM | POA: Diagnosis not present

## 2020-11-19 DIAGNOSIS — F339 Major depressive disorder, recurrent, unspecified: Secondary | ICD-10-CM | POA: Diagnosis not present

## 2020-11-19 LAB — MAGNESIUM: Magnesium: 1.4 mg/dL — ABNORMAL LOW (ref 1.7–2.4)

## 2020-11-19 LAB — BASIC METABOLIC PANEL
Anion gap: 11 (ref 5–15)
BUN: 8 mg/dL (ref 6–20)
CO2: 21 mmol/L — ABNORMAL LOW (ref 22–32)
Calcium: 8.9 mg/dL (ref 8.9–10.3)
Chloride: 109 mmol/L (ref 98–111)
Creatinine, Ser: 0.79 mg/dL (ref 0.44–1.00)
GFR, Estimated: >60 mL/min (ref >60)
Glucose, Bld: 78 mg/dL (ref 70–99)
Potassium: 3.1 mmol/L — ABNORMAL LOW (ref 3.5–5.1)
Sodium: 141 mmol/L (ref 135–145)

## 2020-11-19 MED ORDER — POTASSIUM CHLORIDE 10 MEQ/100ML IV SOLN
10.0000 meq | INTRAVENOUS | Status: AC
  Administered 2020-11-19: 10 meq via INTRAVENOUS
  Filled 2020-11-19: qty 100

## 2020-11-19 MED ORDER — MAGNESIUM SULFATE 2 GM/50ML IV SOLN
2.0000 g | Freq: Once | INTRAVENOUS | Status: AC
  Administered 2020-11-19: 2 g via INTRAVENOUS
  Filled 2020-11-19: qty 50

## 2020-11-19 MED ORDER — POTASSIUM CHLORIDE 10 MEQ/100ML IV SOLN
10.0000 meq | INTRAVENOUS | Status: DC
  Administered 2020-11-19: 10 meq via INTRAVENOUS
  Filled 2020-11-19: qty 100
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
## 2020-11-19 NOTE — Progress Notes (Signed)
      [address]       [place] [address]             [phone]      Left chest tube drainage continues to taper off. 65ml past 24 hours.  CXR is stable. The tube is secure and the dressing is dry.  Will leave left pleural tube in place and continue to follow.   Gaynelle Arabian, PA-C [phone]

## 2020-11-19 NOTE — Progress Notes (Signed)
Patient scheduled for potassium liquid PO.Pt has been refusing potassium PO.  Trauma paged ( Dr. Bedelia Person) about it , no response from MD. Daughter called to check on her mom, daughter spoke to pt about the importance of taking the potassium, pt still refused taking the med. Will continue monitoring.

## 2020-11-19 NOTE — Progress Notes (Signed)
Patient ID: [name], female   DOB: 1966/08/08, 54 y.o.   MRN: [id number]  This NP visited patient at the bedside as a follow up to for palliative medicine needs and emotional support.  Created space and opportunity for patient to explore her thoughts and feelings regarding current medical situation.  This has been a difficult time both physically and psychologically.  Therapeutic listening and emotional support.  Patient is requesting to get out of bed to the chair.  She verbalizes that her bed is uncomfortable.  Education offered regarding the importance of patient participation and buy in to overall treatment plan, and the choices patients face regarding acceptance of medical interventions and the consequences of these choices.  Education offered regarding the importance of participation in her overall rehabilitation and mobility plan.  Demonstrated simple active range of motion exercises for arms,  shoulders and neck.  Jadan verbalizes that she is "trying her best" to move and increase her oral intake.  Latressa ultimately remains hopeful for  recovery and return home  Discussed with patient the importance of continued conversation with her family and their  medical providers regarding overall plan of care and treatment options,  ensuring decisions are within the context of the patients values and GOCs.  Questions and concerns addressed   Discussed with bedside RN   Total time spent on the unit was 25 minutes  Greater than 50% of the time was spent in counseling and coordination of care  [name] NP  Palliative Medicine Team Team Phone # [phone] Pager [phone]  Increase her oral intake

## 2020-11-20 ENCOUNTER — Inpatient Hospital Stay (HOSPITAL_COMMUNITY): Payer: Medicaid Other

## 2020-11-20 DIAGNOSIS — J90 Pleural effusion, not elsewhere classified: Secondary | ICD-10-CM | POA: Diagnosis not present

## 2020-11-20 DIAGNOSIS — E876 Hypokalemia: Secondary | ICD-10-CM | POA: Diagnosis not present

## 2020-11-20 LAB — AEROBIC/ANAEROBIC CULTURE W GRAM STAIN (SURGICAL/DEEP WOUND): Special Requests: NORMAL

## 2020-11-20 LAB — BASIC METABOLIC PANEL
Anion gap: 13 (ref 5–15)
BUN: 7 mg/dL (ref 6–20)
CO2: 22 mmol/L (ref 22–32)
Calcium: 9 mg/dL (ref 8.9–10.3)
Chloride: 105 mmol/L (ref 98–111)
Creatinine, Ser: 0.67 mg/dL (ref 0.44–1.00)
GFR, Estimated: >60 mL/min (ref >60)
Glucose, Bld: 99 mg/dL (ref 70–99)
Potassium: 3.2 mmol/L — ABNORMAL LOW (ref 3.5–5.1)
Sodium: 140 mmol/L (ref 135–145)

## 2020-11-20 LAB — MAGNESIUM: Magnesium: 1.8 mg/dL (ref 1.7–2.4)

## 2020-11-20 MED ORDER — POTASSIUM CHLORIDE 10 MEQ/100ML IV SOLN
10.0000 meq | INTRAVENOUS | Status: AC
  Administered 2020-11-21: 10 meq via INTRAVENOUS
  Filled 2020-11-20: qty 100

## 2020-11-20 MED ORDER — MAGNESIUM SULFATE 2 GM/50ML IV SOLN
2.0000 g | Freq: Once | INTRAVENOUS | Status: AC
  Administered 2020-11-20 (×2): 2 g via INTRAVENOUS
  Filled 2020-11-20: qty 50

## 2020-11-20 MED ORDER — ENSURE ENLIVE PO LIQD: 237.0000 mL | Freq: Two times a day (BID) | ORAL | Status: DC

## 2020-11-20 MED ORDER — PROSOURCE PLUS PO LIQD
30.0000 mL | Freq: Two times a day (BID) | ORAL | Status: DC
  Filled 2020-11-20 (×6): qty 30

## 2020-11-20 MED ORDER — POTASSIUM CHLORIDE 10 MEQ/100ML IV SOLN
10.0000 meq | INTRAVENOUS | Status: AC
  Administered 2020-11-20: 10 meq via INTRAVENOUS
  Filled 2020-11-20: qty 100

## 2020-11-20 NOTE — Progress Notes (Signed)
Patient has been very uncomfortable this shift in sizewise bed and has expressed her desire for a new bed and new recliner. The bed was recently changed out as well as the recliner. Patient explained that at this time there are no other options. Patient given morphine twice for pain and zofran twice for nausea. Patient continues to refuse most meds and is teary at times. Frequent repositionings this shift. Chest tube changed to water seal per Maczis, PA at 1030 and then back to suction at 1600 per Dr. Donata Clay. Patient is resting now after morphine. Patient IV meds off schedule due to limited IV access and tolerability as well as radiology and therapies.
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
## 2020-11-20 NOTE — Progress Notes (Addendum)
Calorie Count Note  Diet: Dysphagia 3 diet with thin liquids.   Day 1: Breakfast: 60 kcal, 2 grams of protein Lunch: 235 kcal, 4 grams of protein Dinner: 78 kcal, 3 grams of protein Supplements: 250 kcal, 9 grams of protein  Day 1 Total intake: 623 kcal (28% of kcal needs)  18 grams of protein (14% of protein needs)  Day 2: Breakfast: 93 kcal, 4 grams of protein Lunch: 290 kcal, 12 grams of protein Dinner: 339 kcal, 19 grams of protein Supplements: 250 kcal, 9 grams of protein  Day 2 Total intake: 972 kcal (43% of kcal needs)  44 grams of protein (34% of protein needs)  Estimated Nutritional Needs:  Kcal:  2563-8937 kcals Protein:  130-150 grams Fluid:  >/= 2 L  Meal completion has been 10-50%. Pt has been trying to eat more at meals. Pt currently has Beneprotein ordered and has been refusing them. Will d/c Beneprotein and order Prosource plus instead. Pt with varied consumption of Boost Breeze. Will additionally order Ensure to aid in PO intake. Encouraged pt to eat her food at meals and to drink her supplements. RD to follow up with calorie count tomorrow.   Nutrition Dx:  Inadequate oral intake related to acute illness as evidenced by NPO status; diet advanced; progressing  Goal:  Patient will meet greater than or equal to 90% of their needs; progressing  Intervention:   ProvideBoost Breeze poQID, each supplement provides 250 kcal and 9 grams of protein.   Provide Ensure Enlive po BID, each supplement provides 350 kcal and 20 grams of protein.   Provide 30 ml Prosource plus po BID, each supplement provides 100 kcal and 15 grams of protein.    Provide Magic cup TID with meals, each supplement provides 290 kcal and 9 grams of protein.   Provide Vital Cuisine/Hormel Shakes with meals TID, each supplement provides 480-500 kcals and 20-23 grams of protein.   Encourage adequate PO intake.    Continue calorie count.  Sara Smiling, MS, RD, LDN RD pager  number/after hours weekend pager number on Amion.
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
## 2020-11-20 NOTE — Progress Notes (Signed)
14 Days Post-Op Procedure(s) (LRB): IRRIGATION AND DEBRIDEMENT EXTREMITY (Left) Subjective: L chest tube drainage decreased- will need to remain until dry CXR ordered for am Objective: Vital signs in last 24 hours: Temp:  [97.7 F (36.5 C)-98.3 F (36.8 C)] 98.3 F (36.8 C) (11/22 1135) Pulse Rate:  [78-91] 84 (11/22 1135) Cardiac Rhythm: Normal sinus rhythm (11/22 0725) Resp:  [14] 14 (11/22 1135) BP: (103-132)/(54-77) 132/77 (11/22 1135) SpO2:  [95 %-99 %] 99 % (11/22 1135)  Hemodynamic parameters for last 24 hours:    Intake/Output from previous day: 11/21 0701 - 11/22 0700 In: 1840 [P.O.:1140; IV Piggyback:650] Out: 2575 [Urine:1425; Drains:10; Stool:1000; Chest Tube:140] Intake/Output this shift: Total I/O In: -  Out: 250 [Stool:250]    Lab Results: No results for input(s): WBC, HGB, HCT, PLT in the last 72 hours. BMET: Recent Labs    11/19/20 0834 11/20/20 1304  NA 141 140  K 3.1* 3.2*  CL 109 105  CO2 21* 22  GLUCOSE 78 99  BUN 8 7  CREATININE 0.79 0.67  CALCIUM 8.9 9.0    PT/INR: No results for input(s): LABPROT, INR in the last 72 hours. ABG    Component Value Date/Time   PHART 7.348 (L) 10/05/2020 0826   HCO3 25.2 10/05/2020 0826   TCO2 27 10/05/2020 0826   ACIDBASEDEF 1.0 10/05/2020 0826   O2SAT 84.0 10/05/2020 0826   CBG (last 3)  Recent Labs    11/17/20 1711  GLUCAP 92    Assessment/Plan: S/P Procedure(s) (LRB): IRRIGATION AND DEBRIDEMENT EXTREMITY (Left) Leave chest tube to suction until dry Check CXR in am   LOS: 59 days    [name] 11/20/2020

## 2020-11-20 NOTE — Progress Notes (Signed)
SLP Cancellation Note  Patient Details Name: Sara [name] MRN: [id number] DOB: 1966-11-09   Cancelled treatment:       Reason Eval/Treat Not Completed: Patient at procedure or test/unavailable (getting transferred back to bed by nursing in order to get imaging done). Will f/u as able.   Mahala Menghini., M.A. CCC-SLP Acute Rehabilitation Services Pager [phone] Office [phone]  11/20/2020, 11:44 AM

## 2020-11-20 NOTE — Consult Note (Signed)
WOC Nurse wound follow up Patient receiving care in 4N14. Wound type: abdominal surgical wound Measurement: na Wound bed: right tunnel measures 2.4 cm; left tunnel measures 4.8 cm Drainage (amount, consistency, odor) serous in cannister Periwound: intact Dressing procedure/placement/frequency: all pieces of white and black foam removed from wound bed and tunnels. Each tunnel was filled with a strip of white foam; white foam placed into the wound bed. Black foam placed over the white, drape applied, immediate seal obtained. Patient tolerated well.  Ostomy appliance did not need changing. Supply order list for VAC dressings and ostomy appliance provided to [place] for ordering. Helmut Muster, RN, MSN, CWOCN, CNS-BC, pager [phone]

## 2020-11-21 ENCOUNTER — Inpatient Hospital Stay (HOSPITAL_COMMUNITY): Payer: Medicaid Other

## 2020-11-21 DIAGNOSIS — E876 Hypokalemia: Secondary | ICD-10-CM | POA: Diagnosis not present

## 2020-11-21 DIAGNOSIS — S52501A Unspecified fracture of the lower end of right radius, initial encounter for closed fracture: Secondary | ICD-10-CM | POA: Diagnosis not present

## 2020-11-21 DIAGNOSIS — J80 Acute respiratory distress syndrome: Secondary | ICD-10-CM | POA: Diagnosis not present

## 2020-11-21 DIAGNOSIS — D689 Coagulation defect, unspecified: Secondary | ICD-10-CM | POA: Diagnosis not present

## 2020-11-21 DIAGNOSIS — J869 Pyothorax without fistula: Secondary | ICD-10-CM | POA: Diagnosis not present

## 2020-11-21 DIAGNOSIS — S82142A Displaced bicondylar fracture of left tibia, initial encounter for closed fracture: Secondary | ICD-10-CM | POA: Diagnosis not present

## 2020-11-21 DIAGNOSIS — J90 Pleural effusion, not elsewhere classified: Secondary | ICD-10-CM | POA: Diagnosis not present

## 2020-11-21 DIAGNOSIS — S2243XA Multiple fractures of ribs, bilateral, initial encounter for closed fracture: Secondary | ICD-10-CM | POA: Diagnosis not present

## 2020-11-21 DIAGNOSIS — S36892A Contusion of other intra-abdominal organs, initial encounter: Secondary | ICD-10-CM | POA: Diagnosis not present

## 2020-11-21 DIAGNOSIS — Z23 Encounter for immunization: Secondary | ICD-10-CM | POA: Diagnosis not present

## 2020-11-21 DIAGNOSIS — Z20822 Contact with and (suspected) exposure to covid-19: Secondary | ICD-10-CM | POA: Diagnosis not present

## 2020-11-21 DIAGNOSIS — S72462C Displaced supracondylar fracture with intracondylar extension of lower end of left femur, initial encounter for open fracture type IIIA, IIIB, or IIIC: Secondary | ICD-10-CM | POA: Diagnosis not present

## 2020-11-21 DIAGNOSIS — K659 Peritonitis, unspecified: Secondary | ICD-10-CM | POA: Diagnosis not present

## 2020-11-21 LAB — BASIC METABOLIC PANEL
Anion gap: 11 (ref 5–15)
Anion gap: 10 (ref 5–15)
BUN: 6 mg/dL (ref 6–20)
BUN: 5 mg/dL — ABNORMAL LOW (ref 6–20)
CO2: 23 mmol/L (ref 22–32)
CO2: 22 mmol/L (ref 22–32)
Calcium: 8.7 mg/dL — ABNORMAL LOW (ref 8.9–10.3)
Calcium: 8.5 mg/dL — ABNORMAL LOW (ref 8.9–10.3)
Chloride: 106 mmol/L (ref 98–111)
Chloride: 106 mmol/L (ref 98–111)
Creatinine, Ser: 0.8 mg/dL (ref 0.44–1.00)
Creatinine, Ser: 0.77 mg/dL (ref 0.44–1.00)
GFR, Estimated: >60 mL/min (ref >60)
GFR, Estimated: >60 mL/min (ref >60)
Glucose, Bld: 99 mg/dL (ref 70–99)
Glucose, Bld: 94 mg/dL (ref 70–99)
Potassium: 2.7 mmol/L — CL (ref 3.5–5.1)
Potassium: 3.4 mmol/L — ABNORMAL LOW (ref 3.5–5.1)
Sodium: 140 mmol/L (ref 135–145)
Sodium: 138 mmol/L (ref 135–145)

## 2020-11-21 LAB — MAGNESIUM: Magnesium: 2 mg/dL (ref 1.7–2.4)

## 2020-11-21 LAB — GLUCOSE, CAPILLARY: Glucose-Capillary: 83 mg/dL (ref 70–99)

## 2020-11-21 MED ORDER — POTASSIUM CHLORIDE 10 MEQ/100ML IV SOLN
10.0000 meq | INTRAVENOUS | Status: AC
  Administered 2020-11-21: 10 meq via INTRAVENOUS

## 2020-11-21 MED ORDER — POTASSIUM CHLORIDE 10 MEQ/100ML IV SOLN
10.0000 meq | INTRAVENOUS | Status: AC
  Administered 2020-11-21 (×3): 10 meq via INTRAVENOUS
  Filled 2020-11-21 (×5): qty 100

## 2020-11-21 NOTE — Progress Notes (Signed)
Patient refused CBG stick educated patient on importance of monitoring CBG. MD notified.
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
## 2020-11-21 NOTE — Progress Notes (Signed)
Calorie Count Note  Diet advanced to a regular diet with thin liquids this morning.   Over the past 24 hours:  Breakfast: 158 kcal, 4 grams of protein Lunch: 73 kcal, 4 grams of protein Dinner: 103 kcal, 3 grams of protein Supplements: none, pt refused  Total intake: 334 kcal (15% of kcal needs)  11 grams of protein (8% of protein needs)  Estimated Nutritional Needs:  Kcal:  0932-3557 kcals Protein:  130-150 grams Fluid:  >/= 2 L  Meal completion has been 10-25%. Pt has been refusing her nutritional supplements despite encouragement. Noted diet advanced to a regular diet with thin liquids this morning. Diet to allow larger range of menu selection to aid in PO intake. Pt encouraged to eat her food at meals. RD to follow up with calorie count now that diet has been advanced.   Nutrition Dx:  Inadequate oral intake related to acute illness as evidenced by NPO status; diet advanced; progressing  Goal:  Patient will meet greater than or equal to 90% of their needs; progressing  Intervention:   ProvideBoost Breeze poQID, each supplement provides 250 kcal and 9 grams of protein.   Provide Ensure Enlive po BID, each supplement provides 350 kcal and 20 grams of protein.   Provide 30 ml Prosource plus po BID, each supplement provides 100 kcal and 15 grams of protein.    Provide Magic cup TID with meals, each supplement provides 290 kcal and 9 grams of protein.   Provide Vital Cuisine/Hormel Shakes with meals TID, each supplement provides 480-500 kcals and 20-23 grams of protein.   Encourage adequate PO intake.    Continue calorie count.  Roslyn Smiling, MS, RD, LDN RD pager number/after hours weekend pager number on Amion.
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
## 2020-11-21 NOTE — Progress Notes (Signed)
Patient complain of pain at IV with flush redness noted at insertion site IV removed.  New IV placed in R hand.   Patient continues to refuse most medications educated patient on importance of medications she verbalized understanding but continued to refused. Will continue with plan of care.
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
## 2020-11-21 NOTE — Progress Notes (Addendum)
Pt refusing IV potassium due to pain intolerance. This RN attempted to accommodate pt multiple times with no success. Pt refuses PO and powder supplements as well. Pt stated they make her throw up. Pt educated on importance on medication adherence. Pt verbalized understanding and stated "it just hurts too much". On call provider notified. No new orders received.

## 2020-11-21 NOTE — Progress Notes (Addendum)
Patient received 3.5 runs of her potassium c/o of burning at IV site refusing to allow it to complete. Educated patient again on importance of potassium. Patient verbalized understanding but cont to refuse. MD paged. Spoke with Dr. Derrell Lolling no new orders given.

## 2020-11-21 NOTE — Progress Notes (Signed)
CRITICAL VALUE ALERT  Critical Value:  Potassium 2.7  Date & Time Notied:  0330  Provider Notified: Kingsinger  Orders Received/Actions taken: No new orders received asked to educate pt on importance of taking potassium.
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
## 2020-11-22 ENCOUNTER — Inpatient Hospital Stay (HOSPITAL_COMMUNITY): Payer: Medicaid Other

## 2020-11-22 DIAGNOSIS — E876 Hypokalemia: Secondary | ICD-10-CM | POA: Diagnosis not present

## 2020-11-22 DIAGNOSIS — J90 Pleural effusion, not elsewhere classified: Secondary | ICD-10-CM | POA: Diagnosis not present

## 2020-11-22 LAB — BASIC METABOLIC PANEL
Anion gap: 11 (ref 5–15)
BUN: <5 mg/dL — ABNORMAL LOW (ref 6–20)
CO2: 22 mmol/L (ref 22–32)
Calcium: 8.6 mg/dL — ABNORMAL LOW (ref 8.9–10.3)
Chloride: 108 mmol/L (ref 98–111)
Creatinine, Ser: 0.72 mg/dL (ref 0.44–1.00)
GFR, Estimated: >60 mL/min (ref >60)
Glucose, Bld: 77 mg/dL (ref 70–99)
Potassium: 3.3 mmol/L — ABNORMAL LOW (ref 3.5–5.1)
Sodium: 141 mmol/L (ref 135–145)

## 2020-11-22 MED ORDER — POTASSIUM CHLORIDE CRYS ER 20 MEQ PO TBCR
40.0000 meq | EXTENDED_RELEASE_TABLET | Freq: Two times a day (BID) | ORAL | Status: AC
  Administered 2020-11-22 (×2): 40 meq via ORAL
  Filled 2020-11-22 (×2): qty 2

## 2020-11-22 NOTE — Consult Note (Signed)
WOC Nurse wound follow up Patient receiving care in [facility] 4N01.  PA, Leary Roca, present and viewed abdominal wound and stoma. Wound type: surgical abdominal wound Measurement: 8.8 cm x 7.2 cm x 1.4 cm. Right tunnel measures 1.8 cm; left tunnel measures 2.9 cm. Wound bed: pink Drainage (amount, consistency, odor) serous in cannister Periwound: intact Dressing procedure/placement/frequency: all pieces of white and black foam removed from wound bed.  One strip of white foam placed in each tunnel, also, covered the wound bed, topped with black foam. Drape applied, immediate seal obtained.  WOC Nurse ostomy follow up Stoma type/location: LLQ colostomy Stomal assessment/size: approximately 1 1/4 inches with MCS from 3 to 5 o'clock with depth of 3.8 at 3 o'clock, less shallow in the remainder of the MCS pocket. Purulent drainage can still be expressed from area when abdomen is palpated. Peristomal assessment: MCS pocket filled with Aquacel. Covered with barrier ring. Treatment options for stomal/peristomal skin: barrier ring Output: thin, brown  There are two superficial 100% pink areas that were under a retention suture.  I covered these with a small, narrow strip of Aquacel and half of a barrier ring before placing the pouch skin barrier down. Ostomy pouching: 2pc. Pouch Lawson #[id number]; skin barrier [place] #[phone]; barrier ring [place] [phone]; Aquacel [place] #[id number] Education provided: none Enrolled patient in [employer] Discharge program: No  Helmut Muster, RN, MSN, [facility], CNS-BC, pager [phone]
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
## 2020-11-22 NOTE — Progress Notes (Signed)
Calorie Count Note  Diet has been advanced to a regular diet with thin liquids.   Over the past 24 hours:  Breakfast: 510 kcal, 15 grams of protein Lunch: 365 kcal, 21 grams of protein Dinner: 711 kcal, 20 grams of protein Supplements: none, pt refused  Total intake: 1586 kcal (70% of kcal needs)  56 grams of protein (43% of protein needs)  Estimated Nutritional Needs:  Kcal:  2250-2625 kcals Protein:  130-150 grams Fluid:  >/= 2 L  Since diet advancement to regular diet, pt po intake has greatly improved. Meal completion has been 50-100%. Pt encouraged to eat her food at meals and to drink her supplements. RD to continue with current nutritional supplementation to aid in increased nutrition needs and healing. RD to discontinue calorie count.   Nutrition Dx:  Inadequate oral intake related to acute illness as evidenced by NPO status; diet advanced; progressing  Goal:  Patient will meet greater than or equal to 90% of their needs; progressing  Intervention:   ProvideBoost Breeze poQID, each supplement provides 250 kcal and 9 grams of protein.   Provide Ensure Enlive po BID, each supplement provides 350 kcal and 20 grams of protein.   Provide 30 ml Prosource plus po BID, each supplement provides 100 kcal and 15 grams of protein.    Provide Magic cup TID with meals, each supplement provides 290 kcal and 9 grams of protein.   Provide Vital Cuisine/Hormel Shakes with meals TID, each supplement provides 480-500 kcals and 20-23 grams of protein.   Encourage adequate PO intake.   Roslyn Smiling, MS, RD, LDN RD pager number/after hours weekend pager number on Amion.
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
## 2020-11-23 ENCOUNTER — Inpatient Hospital Stay (HOSPITAL_COMMUNITY): Payer: Medicaid Other

## 2020-11-23 DIAGNOSIS — J90 Pleural effusion, not elsewhere classified: Secondary | ICD-10-CM | POA: Diagnosis not present

## 2020-11-23 DIAGNOSIS — E876 Hypokalemia: Secondary | ICD-10-CM | POA: Diagnosis not present

## 2020-11-23 LAB — BASIC METABOLIC PANEL
Anion gap: 9 (ref 5–15)
BUN: <5 mg/dL — ABNORMAL LOW (ref 6–20)
CO2: 23 mmol/L (ref 22–32)
Calcium: 8.6 mg/dL — ABNORMAL LOW (ref 8.9–10.3)
Chloride: 109 mmol/L (ref 98–111)
Creatinine, Ser: 0.68 mg/dL (ref 0.44–1.00)
GFR, Estimated: >60 mL/min (ref >60)
Glucose, Bld: 83 mg/dL (ref 70–99)
Potassium: 4.2 mmol/L (ref 3.5–5.1)
Sodium: 141 mmol/L (ref 135–145)

## 2020-11-23 NOTE — Progress Notes (Signed)
      [address]       [place] [address]             [phone]      17 Days Post-Op Procedure(s) (LRB): IRRIGATION AND DEBRIDEMENT EXTREMITY (Left)   Subjective:  Continues to have pain at chest tube site.  Objective: Vital signs in last 24 hours: Temp:  [98 F (36.7 C)-98.4 F (36.9 C)] 98.4 F (36.9 C) (11/25 0756) Pulse Rate:  [79-91] 91 (11/25 0756) Cardiac Rhythm: Normal sinus rhythm (11/25 0700) Resp:  [12-14] 14 (11/25 0756) BP: (103-123)/(55-71) 123/71 (11/25 0756) SpO2:  [97 %-100 %] 98 % (11/25 0756)  Intake/Output from previous day: 11/24 0701 - 11/25 0700 In: 1423.5 [P.O.:800; IV Piggyback:623.5] Out: 2840 [Urine:1250; Drains:35; Stool:1425; Chest Tube:130] Intake/Output this shift: Total I/O In: 350 [P.O.:350] Out: 500 [Urine:500]  General appearance: alert, cooperative and no distress Heart: regular rate and rhythm Lungs: slightly diminshed on left, right CTA Wound: clean and dry  Lab Results: No results for input(s): WBC, HGB, HCT, PLT in the last 72 hours. BMET: Recent Labs    11/22/20 0537 11/23/20 0149  NA 141 141  K 3.3* 4.2  CL 108 109  CO2 22 23  GLUCOSE 77 83  BUN <5* <5*  CREATININE 0.72 0.68  CALCIUM 8.6* 8.6*    PT/INR: No results for input(s): LABPROT, INR in the last 72 hours. ABG    Component Value Date/Time   PHART 7.348 (L) 10/05/2020 0826   HCO3 25.2 10/05/2020 0826   TCO2 27 10/05/2020 0826   ACIDBASEDEF 1.0 10/05/2020 0826   O2SAT 84.0 10/05/2020 0826   CBG (last 3)  Recent Labs    11/21/20 0805  GLUCAP 83    Assessment/Plan: S/P Procedure(s) (LRB): IRRIGATION AND DEBRIDEMENT EXTREMITY (Left)  1. CT output continues to decrease, 130 cc output yesterday, will leave in place today, if drainage is less than 100 cc may be able to remove chest tube tomorrow, CXR remains stable, repeat in AM   LOS: 62 days    Lowella Dandy, PA-C 11/23/2020
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
## 2020-11-24 ENCOUNTER — Inpatient Hospital Stay (HOSPITAL_COMMUNITY): Payer: Medicaid Other

## 2020-11-24 DIAGNOSIS — Z515 Encounter for palliative care: Secondary | ICD-10-CM | POA: Diagnosis not present

## 2020-11-24 DIAGNOSIS — R531 Weakness: Secondary | ICD-10-CM | POA: Diagnosis not present

## 2020-11-24 DIAGNOSIS — M869 Osteomyelitis, unspecified: Secondary | ICD-10-CM | POA: Diagnosis not present

## 2020-11-24 DIAGNOSIS — E876 Hypokalemia: Secondary | ICD-10-CM | POA: Diagnosis not present

## 2020-11-24 DIAGNOSIS — T07XXXA Unspecified multiple injuries, initial encounter: Secondary | ICD-10-CM | POA: Diagnosis not present

## 2020-11-24 DIAGNOSIS — J90 Pleural effusion, not elsewhere classified: Secondary | ICD-10-CM | POA: Diagnosis not present

## 2020-11-24 LAB — BASIC METABOLIC PANEL
Anion gap: 10 (ref 5–15)
BUN: <5 mg/dL — ABNORMAL LOW (ref 6–20)
CO2: 21 mmol/L — ABNORMAL LOW (ref 22–32)
Calcium: 8.7 mg/dL — ABNORMAL LOW (ref 8.9–10.3)
Chloride: 106 mmol/L (ref 98–111)
Creatinine, Ser: 0.84 mg/dL (ref 0.44–1.00)
GFR, Estimated: >60 mL/min (ref >60)
Glucose, Bld: 96 mg/dL (ref 70–99)
Potassium: 4.2 mmol/L (ref 3.5–5.1)
Sodium: 137 mmol/L (ref 135–145)
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
## 2020-11-24 NOTE — Consult Note (Signed)
WOC Nurse wound follow up Patient receiving care in [facility] 4N01. Wound type: Abdominal surgical wound Measurement: na Wound bed: pink Drainage (amount, consistency, odor) serous Periwound: intact Dressing procedure/placement/frequency:  All pieces of white and black foam removed. Each tunnel filled with white foam, as was the wound bed. Black foam placed over it, drape applies, immediate seal obtained. Patient tolerated well, conversing during care.  WOC Nurse ostomy follow up Stoma type/location: LLQ colostomy Stomal assessment/size: same Peristomal assessment: MCS pocket Aquacel placed 2 days ago removed, pocket cleansed, then filled with Aquacel. Barrier ring applied. Treatment options for stomal/peristomal skin: barrier ring Output: thin, brown  Ostomy pouching: 2pc. Flat, 2 and 3/4 inches. Supplies in room Education provided: none Enrolled patient in [employer] Discharge program: No  The healing retention suture wound below the stoma covered with Aquacel and half of a barrier ring before skin barrier applied.  New cannister inserted into VAC machine.  Patient tolerated well.  Additional ostomy and VAC supplies requested. Helmut Muster, RN, MSN, CWOCN, CNS-BC, pager [phone]
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
## 2020-11-24 NOTE — Progress Notes (Signed)
Pt refusing many medications despite educating on their use and effectiveness. Particularly the protonix after patient stated she didn't want her vit C because "my stomach is too acidic". Pt also refusing blood sugar checks and any insulins. Room is dark with lights off and shades down. This nurse requested putting shades up for sunshine and therapeutic reasons however, patient declined and stated "not now". She also refused to have lights on however, lights on while nurse performing assessment and care.

## 2020-11-24 NOTE — Progress Notes (Signed)
Pt c/o nausea and is dry heaving. Zofran requested and given.
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
## 2020-11-24 NOTE — Progress Notes (Signed)
Patient ID: Sara [name], female   DOB: 12-02-1966, 54 y.o.   MRN: [id number]  This NP visited patient at the bedside as a follow up to for palliative medicine needs and emotional support.  Patient appears weak and her face is puffy.  She is sitting in the dark.  I opened all her blinds.  Created space and opportunity for patient to explore her thoughts and feelings regarding current medical situation.  This has been a difficult time both physically and psychologically.  Therapeutic listening and emotional support.    She continues to  verbalizes that her bed is uncomfortable.  Patient is requesting to get out of bed to the chair.  Discussed with bedside RN and plan is for PT to get patient out of bed to the chair and sit by the window today.  Education offered regarding the importance of patient participation and buy in to overall treatment plan, and the choices patients face regarding acceptance of medical interventions and the consequences of these choices.  Education offered regarding positive [place] talk and motivation.  Patient verbalizes understanding and again she tells me "I am trying"  Education offered regarding the importance of participation in her overall rehabilitation and mobility plan.   Discussed with patient the importance of continued conversation with her family and their  medical providers regarding overall plan of care and treatment options,  ensuring decisions are within the context of the patients values and GOCs.  Questions and concerns addressed   Discussed with bedside RN   PMT will continue to support holistically  Total time spent on the unit was 20 minutes  Greater than 50% of the time was spent in counseling and coordination of care  [name] NP  Palliative Medicine Team Team Phone # [phone] Pager [phone]  Increase her oral intake

## 2020-11-25 ENCOUNTER — Inpatient Hospital Stay (HOSPITAL_COMMUNITY): Payer: Medicaid Other

## 2020-11-25 DIAGNOSIS — J90 Pleural effusion, not elsewhere classified: Secondary | ICD-10-CM | POA: Diagnosis not present

## 2020-11-25 DIAGNOSIS — E876 Hypokalemia: Secondary | ICD-10-CM | POA: Diagnosis not present

## 2020-11-25 NOTE — Progress Notes (Signed)
Spoke with Electronics engineer for [employer] and it has been approved for Sara [name] to have two visitors per 24 hours. Family has been made aware that either 2 at a time or 2 visitors alternating but no more than 2 in 24 hours.
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
## 2020-11-25 NOTE — Op Note (Signed)
[id number] 
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
## 2020-11-26 DIAGNOSIS — J90 Pleural effusion, not elsewhere classified: Secondary | ICD-10-CM | POA: Diagnosis not present

## 2020-11-26 DIAGNOSIS — E876 Hypokalemia: Secondary | ICD-10-CM | POA: Diagnosis not present

## 2020-11-26 NOTE — Progress Notes (Signed)
Pt complains of inability to void. Bladder scan performed and showed 550 ml present in bladder. In&out cath performed via sterile technique and 600 ml removed. Dr. Sheliah Hatch notified, MD states to bladder scan again in 6-8 hours IF pt is awake and has not voided.
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
## 2020-11-27 ENCOUNTER — Inpatient Hospital Stay (HOSPITAL_COMMUNITY): Payer: Medicaid Other

## 2020-11-27 DIAGNOSIS — E876 Hypokalemia: Secondary | ICD-10-CM | POA: Diagnosis not present

## 2020-11-27 DIAGNOSIS — J90 Pleural effusion, not elsewhere classified: Secondary | ICD-10-CM | POA: Diagnosis not present

## 2020-11-27 LAB — BASIC METABOLIC PANEL
Anion gap: 14 (ref 5–15)
BUN: <5 mg/dL — ABNORMAL LOW (ref 6–20)
CO2: 22 mmol/L (ref 22–32)
Calcium: 8.6 mg/dL — ABNORMAL LOW (ref 8.9–10.3)
Chloride: 105 mmol/L (ref 98–111)
Creatinine, Ser: 0.87 mg/dL (ref 0.44–1.00)
GFR, Estimated: >60 mL/min (ref >60)
Glucose, Bld: 83 mg/dL (ref 70–99)
Potassium: 3.6 mmol/L (ref 3.5–5.1)
Sodium: 141 mmol/L (ref 135–145)
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
## 2020-11-27 NOTE — TOC Progression Note (Signed)
Transition of Care [facility]) - Progression Note    Patient Details  Name: Sara [name] MRN: [id number] Date of Birth: Nov 17, 1966  Transition of [facility]) CM/SW Contact  Glennon Mac, RN Phone Number: 11/27/2020, 3:19 PM  Clinical Narrative:  Chest tube and JP drain dc'd today.  Updated pt's FL2 and faxed out for SNF bed search.  Will follow with updates as available.     Expected Discharge Plan: Skilled Nursing Facility Barriers to Discharge: Continued Medical Work up  Expected Discharge Plan and Services Expected Discharge Plan: Skilled Nursing Facility   Discharge Planning Services: CM Consult   Living arrangements for the past 2 months: Single Family Home                                       Social Determinants of Health (SDOH) Interventions    Readmission Risk Interventions No flowsheet data found.  Quintella Baton, RN, BSN  Trauma/Neuro ICU Case Manager [phone]
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
## 2020-11-27 NOTE — Progress Notes (Addendum)
      [address]       [place] [address]             [phone]       21 Days Post-Op Procedure(s) (LRB): IRRIGATION AND DEBRIDEMENT EXTREMITY (Left)  Subjective: Patient still with some pain at left chest tube site.  Objective: Vital signs in last 24 hours: Temp:  [97.8 F (36.6 C)-98.5 F (36.9 C)] 97.9 F (36.6 C) (11/29 0315) Pulse Rate:  [87-95] 90 (11/28 2307) Cardiac Rhythm: Normal sinus rhythm (11/29 0706) Resp:  [17-20] 17 (11/28 1908) BP: (120-141)/(66-76) 125/67 (11/28 2307) SpO2:  [96 %-99 %] 97 % (11/28 2307)     Intake/Output from previous day: 11/28 0701 - 11/29 0700 In: 629.7 [P.O.:180; IV Piggyback:449.7] Out: 3725 [Urine:1950; Stool:1775]   Physical Exam:  Cardiovascular: RRR Pulmonary: Clear to auscultation on the right and slightly diminished left base Wounds: Dressing is clean and dry.   Left chest Tube: to suction and there is no air leak.  Lab Results: CBC: No results for input(s): WBC, HGB, HCT, PLT in the last 72 hours. BMET:  Recent Labs    11/27/20 0526  NA 141  K 3.6  CL 105  CO2 22  GLUCOSE 83  BUN <5*  CREATININE 0.87  CALCIUM 8.6*    PT/INR: No results for input(s): LABPROT, INR in the last 72 hours. ABG:  INR: Will add last result for INR, ABG once components are confirmed Will add last 4 CBG results once components are confirmed  Assessment/Plan:  1. CV - SR 2.  Pulmonary - Loculated, small left abscess/empyema. S/p pigtail exchange of left chest tube 11/17 by IR. Drainage scant last 24 hours. CXR this am appears stable. Remove chest tube. Encourage incentive spirometer 3. ID-Leukocytosis-Last WBC 12,000. On Unasyn for  left abscess/empyema. LLQ abd wall abscess showed rare Staph Aureus and abundant Candida Glabrata and Bacterioids Fragilis. 4. Management per trauma, orthopedics  Donielle M ZimmermanPA-C 11/27/2020,7:16 AM [phone]  CXR reviewed Agree with plan to remove chest  tube  patient examined and medical record reviewed,agree with above note. [name] 11/27/2020
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
## 2020-11-27 NOTE — Consult Note (Signed)
WOC Nurse wound follow up Patient receiving care in [facility] 4N01. Wound type: Abdominal surgical wound Measurement: na Wound bed: pink Drainage (amount, consistency, odor) serous Periwound: intact Dressing procedure/placement/frequency:  All pieces of white and black foam removed. Each tunnel filled with white foam, Black foam placed over wound bed, drape applied, immediate seal obtained. Patient tolerated well, conversing during care.  WOC Nurse ostomy follow up Stoma type/location: LLQ colostomy Stomal assessment/size: same Peristomal assessment: MCS pocket Aquacel placed 2 days ago removed, pocket cleansed, then filled with Aquacel. Barrier ring applied. Treatment options for stomal/peristomal skin: barrier ring Output: thin, brown  Ostomy pouching: 1 piece convex pouch was placed. Can be switched back to 2 pc on Wed.  Supplies in room Education provided: none Enrolled patient in [employer] Discharge program: No  The healing retention suture wound below the stoma covered with Aquacel and half of a barrier ring before skin barrier applied. New cannister had been inserted into VAC machine prior to my arrival and was empty.  Renaldo Reel Katrinka Blazing, MSN, RN, CMSRN, Angus Seller, [facility] Wound Treatment Associate Pager [phone]
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
## 2020-11-27 NOTE — Plan of Care (Signed)
  Problem: Education: Goal: Knowledge of General Education information will improve Description Including pain rating scale, medication(s)/side effects and non-pharmacologic comfort measures Outcome: Progressing   

## 2020-11-27 NOTE — Progress Notes (Signed)
Bladder scan performed at 0615 and shows 567 ml urine present in bladder. Pt has difficulty starting stream but is able to void after trying for 30 minutes. 550 ml out via purwick external catheter.

## 2020-11-28 ENCOUNTER — Inpatient Hospital Stay (HOSPITAL_COMMUNITY): Payer: Medicaid Other

## 2020-11-28 DIAGNOSIS — E876 Hypokalemia: Secondary | ICD-10-CM | POA: Diagnosis not present

## 2020-11-28 DIAGNOSIS — J90 Pleural effusion, not elsewhere classified: Secondary | ICD-10-CM | POA: Diagnosis not present

## 2020-11-28 MED ORDER — DICLOFENAC SODIUM 1 % EX GEL
2.0000 g | Freq: Two times a day (BID) | CUTANEOUS | Status: DC
  Administered 2020-11-28 – 2020-12-11 (×11): 2 g via TOPICAL
  Filled 2020-11-28: qty 100

## 2020-11-28 MED ORDER — PIPERACILLIN-TAZOBACTAM 3.375 G IVPB
3.3750 g | Freq: Three times a day (TID) | INTRAVENOUS | Status: DC
  Administered 2020-11-28 – 2020-12-02 (×13): 3.375 g via INTRAVENOUS
  Filled 2020-11-28 (×14): qty 50
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
## 2020-11-28 NOTE — TOC Progression Note (Signed)
Transition of Care [facility]) - Progression Note    Patient Details  Name: Sara [name] MRN: [id number] Date of Birth: December 14, 1966  Transition [facility]) CM/SW Contact  Glennon Mac, RN Phone Number: 11/28/2020, 3:14 PM  Clinical Narrative: Noted PT/OT's change in recommendation from SNF to CIR.  Spoke with patient's daughter, Sara [name] by phone, to discuss needed support at discharge.  Sara [name] states that she and her husband work, but there are multiple family members who can also assist. Database administrator, brother, aunt and uncle.)  Sara [name] states she will plan to discuss needed 24/7 support with family members and see if they can come up with a plan for care, should patient be able to go to inpatient rehab.    Expected Discharge Plan: IP Rehab Facility Barriers to Discharge: Continued Medical Work up  Expected Discharge Plan and Services Expected Discharge Plan: IP Rehab Facility   Discharge Planning Services: CM Consult   Living arrangements for the past 2 months: Single Family Home                                       Social Determinants of Health (SDOH) Interventions    Readmission Risk Interventions No flowsheet data found.  Quintella Baton, RN, BSN  Trauma/Neuro ICU Case Manager [phone]
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
## 2020-11-28 NOTE — Progress Notes (Addendum)
      [address]       [place] [address]             [phone]       22 Days Post-Op Procedure(s) (LRB): IRRIGATION AND DEBRIDEMENT EXTREMITY (Left)  Subjective: Patient sleeping and awakened. She has less pain on left side, now that chest tube has been removed.  Objective: Vital signs in last 24 hours: Temp:  [97.6 F (36.4 C)-98.2 F (36.8 C)] 97.9 F (36.6 C) (11/30 0412) Pulse Rate:  [71-94] 71 (11/30 0412) Cardiac Rhythm: Normal sinus rhythm (11/30 0701) Resp:  [22] 22 (11/29 1143) BP: (125-136)/(66-90) 125/66 (11/30 0412) SpO2:  [97 %-100 %] 99 % (11/30 0412) Weight:  [96.2 kg] 96.2 kg (11/30 0500)     Intake/Output from previous day: 11/29 0701 - 11/30 0700 In: 895.5 [P.O.:360; IV Piggyback:535.5] Out: 700 [Urine:700]   Physical Exam:  Cardiovascular: RRR Pulmonary: Clear to auscultation bilaterally Wounds: Dressing is clean and dry.     Lab Results: CBC: No results for input(s): WBC, HGB, HCT, PLT in the last 72 hours. BMET:  Recent Labs    11/27/20 0526  NA 141  K 3.6  CL 105  CO2 22  GLUCOSE 83  BUN <5*  CREATININE 0.87  CALCIUM 8.6*    PT/INR: No results for input(s): LABPROT, INR in the last 72 hours. ABG:  INR: Will add last result for INR, ABG once components are confirmed Will add last 4 CBG results once components are confirmed  Assessment/Plan:  1. CV - SR 2.  Pulmonary - Loculated, small left abscess/empyema. S/p pigtail exchange of left chest tube 11/17 by IR. Chest tube removed yesterday. Await for CXR to be taken this am.  Encourage incentive spirometer 3. ID-Leukocytosis-Last WBC 12,000. On Unasyn for  left abscess/empyema. LLQ abd wall abscess showed rare Staph Aureus and abundant Candida Glabrata and Bacterioids Fragilis. 4. Management per trauma, orthopedics  Donielle M ZimmermanPA-C 11/28/2020,7:42 AM [phone]  CXR today reviewed and is satisfactory Will follow up with repeat CXR in 48  hours patient examined and medical record reviewed,agree with above note. [name] 11/28/2020
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
## 2020-11-29 DIAGNOSIS — E876 Hypokalemia: Secondary | ICD-10-CM | POA: Diagnosis not present

## 2020-11-29 DIAGNOSIS — J90 Pleural effusion, not elsewhere classified: Secondary | ICD-10-CM | POA: Diagnosis not present

## 2020-11-29 DIAGNOSIS — Z419 Encounter for procedure for purposes other than remedying health state, unspecified: Secondary | ICD-10-CM | POA: Diagnosis not present

## 2020-11-29 LAB — OCCULT BLOOD X 1 CARD TO LAB, STOOL: Fecal Occult Bld: NEGATIVE

## 2020-11-29 NOTE — Consult Note (Signed)
WOC Nurse wound follow up Patient receiving care in [facility] 4N1. Wound type: abdominal surgical wound, healing Measurement: 8 cm x 7.2 cm x 1.8 cm. Right tunnel measures 1.8 cm; left tunnel measures 2.8 cm Wound bed: pink Drainage (amount, consistency, odor) serous in cannister Periwound: intact Dressing procedure/placement/frequency: All existing foam (white and black) removed from tunnels and wound bed.  One piece of white foam inserted into each tunnel, white foam placed over wound bed, topped with black foam, drape applied and immediate seal obtained.  Patient tolerated well.  The one piece convex ostomy pouch that was placed 2 days ago had been removed and replaced with a 2 piece flat.  That pouching system was not leaking, did not appear to have impending leakage, therefore, it was not changed.  Ostomy and VAC supplies are in room for next changes. Helmut Muster, RN, MSN, CWOCN, CNS-BC, pager [phone]
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
## 2020-11-29 NOTE — Progress Notes (Signed)
As per Dr. Donata Clay, will order follow up CXR in am as had trace left apical pneumothorax after chest tube removal 11/29.

## 2020-11-29 NOTE — Progress Notes (Signed)
Patient ID: Sara [name], female   DOB: 1966-08-10, 54 y.o.   MRN: [id number]  This NP visited patient at the bedside as a follow up to for palliative medicine needs and emotional support.   She is in bed. She appears somewhat flat but tells she is ok.    Ongoing complaints of uncomfortable bed and chair.  Ongoing education regarding the importance of  personal responsibility in treatment plan.  Discussed importance of [name] shifting her weight and rolling side to side to relieve pressure on sensitive areas.  Education regarding Active ROM exercises.  Will ask nursing to ensure that patient gets OOB to chair daily patient likes the idea.  Created space and opportunity for patient to explore her thoughts and feelings regarding current medical situation.  This has been a difficult time both physically and psychologically.  Therapeutic listening and emotional support.   We talked about her six dogs.  We investigate pet visit.  Unfortunately pet therapy is on hold due to covid.  Education offered regarding the importance of patient participation and buy in to overall treatment plan, and the choices patients face regarding acceptance of medical interventions and the consequences of these choices.  Education offered regarding positive Fabre talk and motivation.     Discussed with patient the importance of continued conversation with her family and their  medical providers regarding overall plan of care and treatment options,  ensuring decisions are within the context of the patients values and GOCs.  Daughter at bedside.  Support to her and answered her questions and concerns  Questions and concerns addressed   Discussed with bedside RN   PMT will continue to support holistically  Total time spent on the unit was 35 minutes  Greater than 50% of the time was spent in counseling and coordination of care  [name] NP  Palliative Medicine Team Team Phone # [phone] Pager  [phone]  Increase her oral intake
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
## 2020-11-30 ENCOUNTER — Encounter (HOSPITAL_COMMUNITY): Payer: Self-pay

## 2020-11-30 ENCOUNTER — Inpatient Hospital Stay (HOSPITAL_COMMUNITY): Payer: Medicaid Other

## 2020-11-30 DIAGNOSIS — E876 Hypokalemia: Secondary | ICD-10-CM | POA: Diagnosis not present

## 2020-11-30 DIAGNOSIS — R531 Weakness: Secondary | ICD-10-CM | POA: Diagnosis not present

## 2020-11-30 DIAGNOSIS — J869 Pyothorax without fistula: Secondary | ICD-10-CM | POA: Diagnosis not present

## 2020-11-30 DIAGNOSIS — M869 Osteomyelitis, unspecified: Secondary | ICD-10-CM | POA: Diagnosis not present

## 2020-11-30 DIAGNOSIS — Z515 Encounter for palliative care: Secondary | ICD-10-CM | POA: Diagnosis not present

## 2020-11-30 DIAGNOSIS — T07XXXA Unspecified multiple injuries, initial encounter: Secondary | ICD-10-CM | POA: Diagnosis not present

## 2020-11-30 DIAGNOSIS — J90 Pleural effusion, not elsewhere classified: Secondary | ICD-10-CM | POA: Diagnosis not present

## 2020-11-30 MED ORDER — PROMETHAZINE HCL 25 MG/ML IJ SOLN
12.5000 mg | Freq: Four times a day (QID) | INTRAMUSCULAR | Status: DC | PRN
  Filled 2020-11-30 (×2): qty 1
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
## 2020-11-30 NOTE — Progress Notes (Signed)
PT Cancellation Note  Patient Details Name: Sara [name] MRN: [id number] DOB: 08/31/1966   Cancelled Treatment:    Reason Eval/Treat Not Completed: Medical issues which prohibited therapy. Pt declined due to significant nausea at this time. PT will continue to follow and progress mobility as time/schedule allow.   Rolm Baptise, PT, DPT   Acute Rehabilitation Department Pager #: [phone]   [name] 11/30/2020, 6:05 PM

## 2020-11-30 NOTE — Progress Notes (Signed)
Inpatient Rehabilitation Admissions Coordinator  Inpatient rehab consult received. I will follow up once Rehab MD completes consult.  Ottie Glazier, RN, MSN Rehab Admissions Coordinator [phone] 11/30/2020 3:16 PM

## 2020-11-30 NOTE — Progress Notes (Signed)
When this nurse went to empty the patient's colostomy bag it was full of a red/orange fluid.  Pt reported no abdominal pain or gastrointestinal changes and reported drinking fruit punch this afternoon.  Occult blood test completed and results came back negative, will continue to monitor

## 2020-12-01 DIAGNOSIS — E876 Hypokalemia: Secondary | ICD-10-CM | POA: Diagnosis not present

## 2020-12-01 DIAGNOSIS — J90 Pleural effusion, not elsewhere classified: Secondary | ICD-10-CM | POA: Diagnosis not present

## 2020-12-01 MED ORDER — PROMETHAZINE HCL 25 MG/ML IJ SOLN
12.5000 mg | Freq: Four times a day (QID) | INTRAMUSCULAR | Status: DC | PRN
  Administered 2020-12-01: 12.5 mg via INTRAVENOUS
  Filled 2020-12-01 (×2): qty 1

## 2020-12-01 NOTE — Progress Notes (Signed)
Inpatient Rehabilitation Admissions Coordinator  I contacted pt's daughter, [name], by phone to discuss inpt rehab goals and expectations. She and family are discussing if they can provide the projected level of care after a possible 3 week CIR admit. Noted OR 12/7. I will follow up with [name] by Tuesday, to clarify caregiver support. If available, I would then begin insurance approval with [place] med[employer].   Ottie Glazier, RN, MSN Rehab Admissions Coordinator [phone] 12/01/2020 3:21 PM

## 2020-12-01 NOTE — TOC Progression Note (Signed)
Transition of Care [facility]) - Progression Note    Patient Details  Name: Sara [name] MRN: [id number] Date of Birth: Apr 16, 1966  Transition of Care [facility]) CM/SW Contact  Glennon Mac, RN Phone Number: 12/01/2020, 1:58 PM  Clinical Narrative:   Plan return to OR on 12/7 for grafting Lt distal femur.  Rehab consult has been ordered; pt showing more motivation with therapies.  Hopeful that family can rally together to provide support needed after inpatient rehab; daughter Sara [name] is working on this.     Expected Discharge Plan: IP Rehab Facility Barriers to Discharge: Continued Medical Work up  Expected Discharge Plan and Services Expected Discharge Plan: IP Rehab Facility   Discharge Planning Services: CM Consult   Living arrangements for the past 2 months: Single Family Home                                       Social Determinants of Health (SDOH) Interventions    Readmission Risk Interventions No flowsheet data found.  Quintella Baton, RN, BSN  Trauma/Neuro ICU Case Manager [phone]
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
## 2020-12-01 NOTE — Plan of Care (Signed)
Problem: Education: Goal: Knowledge of General Education information will improve Description: Including pain rating scale, medication(s)/side effects and non-pharmacologic comfort measures Outcome: Progressing   Problem: Health Behavior/Discharge Planning: Goal: Ability to manage health-related needs will improve Outcome: Progressing   Problem: Clinical Measurements: Goal: Ability to maintain clinical measurements within normal limits will improve Outcome: Progressing Goal: Will remain free from infection Outcome: Progressing Goal: Diagnostic test results will improve Outcome: Progressing Goal: Respiratory complications will improve Outcome: Progressing Goal: Cardiovascular complication will be avoided Outcome: Progressing   Problem: Activity: Goal: Risk for activity intolerance will decrease Outcome: Progressing   Problem: Nutrition: Goal: Adequate nutrition will be maintained Outcome: Progressing   Problem: Coping: Goal: Level of anxiety will decrease Outcome: Progressing   Problem: Elimination: Goal: Will not experience complications related to bowel motility Outcome: Progressing Goal: Will not experience complications related to urinary retention Outcome: Progressing   Problem: Pain Managment: Goal: General experience of comfort will improve Outcome: Progressing   Problem: Safety: Goal: Ability to remain free from injury will improve Outcome: Progressing   Problem: Skin Integrity: Goal: Risk for impaired skin integrity will decrease Outcome: Progressing   Problem: Activity: Goal: Ability to tolerate increased activity will improve Outcome: Progressing   Problem: Respiratory: Goal: Ability to maintain a clear airway and adequate ventilation will improve Outcome: Progressing   Problem: Role Relationship: Goal: Method of communication will improve Outcome: Progressing   Problem: Activity: Goal: Ability to tolerate increased activity will  improve Outcome: Progressing   Problem: Respiratory: Goal: Ability to maintain a clear airway and adequate ventilation will improve Outcome: Progressing   Problem: Role Relationship: Goal: Method of communication will improve Outcome: Progressing   Problem: Education: Goal: Verbalization of understanding the information provided (i.e., activity precautions, restrictions, etc) will improve Outcome: Progressing Goal: Individualized Educational Video(s) Outcome: Progressing   Problem: Activity: Goal: Ability to ambulate and perform ADLs will improve Outcome: Progressing   Problem: Clinical Measurements: Goal: Postoperative complications will be avoided or minimized Outcome: Progressing   Problem: [place]-Concept: Goal: Ability to maintain and perform role responsibilities to the fullest extent possible will improve Outcome: Progressing   Problem: Pain Management: Goal: Pain level will decrease Outcome: Progressing   Problem: Activity: Goal: Ability to perform activities at highest level will improve Outcome: Progressing Goal: Ability to avoid complications of mobility impairment will improve Outcome: Progressing Goal: Ability to tolerate increased activity will improve Outcome: Progressing Goal: Will remain free from falls Outcome: Progressing   Problem: Respiratory: Goal: Ability to maintain adequate ventilation will improve Outcome: Progressing   Problem: Tissue Perfusion: Goal: Hemodynamically stable with effective tissue perfusion will improve Outcome: Progressing Goal: Postoperative complications will be avoided or minimized Outcome: Progressing   Problem: Skin Integrity: Goal: Ability to maintain adequate tissue integrity will improve Outcome: Progressing   Problem: Infection: Goal: Risk for infection will decrease (spleen) Outcome: Progressing   Problem: Bowel/Gastric: Goal: Gastrointestinal status for postoperative course will improve Outcome:  Progressing Goal: GI tract motility and GI tissue perfusion will improve Outcome: Progressing Goal: Ability to demonstrate the techniques of an individualized bowel program will improve Outcome: Progressing   Problem: Urinary Elimination: Goal: Ability to achieve a regular elimination pattern will improve Outcome: Progressing   Problem: Fluid Volume: Goal: Return to normovolemic state will improve Outcome: Progressing   Problem: Metabolic: Goal: Ability to maintain a metabolic balance will improve Outcome: Progressing   Problem: Education: Goal: Knowledge of the prescribed therapeutic regimen will improve Outcome: Progressing Goal: Knowledge of injury and care (of   blunt abdominal trauma) will improve Outcome: Progressing   Problem: Coping: Goal: Exhibits appropriate coping mechanism and reduced anxiety resulting from physical and emotional stress Outcome: Progressing   Problem: Manso-Concept: Goal: Ability to acknowledge body changes will improve Outcome: Progressing
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
## 2020-12-02 DIAGNOSIS — J90 Pleural effusion, not elsewhere classified: Secondary | ICD-10-CM | POA: Diagnosis not present

## 2020-12-02 DIAGNOSIS — E876 Hypokalemia: Secondary | ICD-10-CM | POA: Diagnosis not present

## 2020-12-02 MED ORDER — PIPERACILLIN-TAZOBACTAM 3.375 G IVPB
3.3750 g | Freq: Three times a day (TID) | INTRAVENOUS | Status: DC
  Administered 2020-12-02 – 2020-12-05 (×8): 3.375 g via INTRAVENOUS
  Filled 2020-12-02 (×8): qty 50
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
## 2020-12-03 DIAGNOSIS — J90 Pleural effusion, not elsewhere classified: Secondary | ICD-10-CM | POA: Diagnosis not present

## 2020-12-03 DIAGNOSIS — E876 Hypokalemia: Secondary | ICD-10-CM | POA: Diagnosis not present
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
## 2020-12-04 ENCOUNTER — Inpatient Hospital Stay (HOSPITAL_COMMUNITY): Payer: Medicaid Other | Admitting: Certified Registered Nurse Anesthetist

## 2020-12-04 MED ORDER — ONDANSETRON HCL 4 MG PO TABS: 4.0000 mg | ORAL_TABLET | Freq: Four times a day (QID) | ORAL | Status: DC | PRN

## 2020-12-04 MED ORDER — PANTOPRAZOLE SODIUM 40 MG PO TBEC
40.0000 mg | DELAYED_RELEASE_TABLET | Freq: Every day | ORAL | Status: DC
  Administered 2020-12-11: 40 mg via ORAL
  Filled 2020-12-04 (×2): qty 1

## 2020-12-04 MED ORDER — PROMETHAZINE HCL 25 MG PO TABS: 12.5000 mg | ORAL_TABLET | Freq: Four times a day (QID) | ORAL | Status: DC | PRN

## 2020-12-04 MED ORDER — MEGESTROL ACETATE 40 MG PO TABS
40.0000 mg | ORAL_TABLET | Freq: Two times a day (BID) | ORAL | Status: DC
  Administered 2020-12-07 – 2020-12-09 (×4): 40 mg via ORAL
  Filled 2020-12-04 (×22): qty 1
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
## 2020-12-04 NOTE — Consult Note (Signed)
WOC Nurse wound follow up Patient receiving care in [facility] 4N01. Wound type: healing abdominal surgical wound Measurement: deferred Wound bed: 100% pink, clean Drainage (amount, consistency, odor) serosanginous in cannister Periwound: intact Dressing procedure/placement/frequency: all pieces of white and black foam removed from wound bed. Tunnels filled with white foam, as was wound bed. Topped with black foam, drape applied, immediate seal obtained. Patient tolerated very well, sleeping through the first half of the change.  WOC Nurse ostomy follow up Ostomy appliance placed on 12/03 removed along with Aquacel filling the MCS pocket.  Peristoma cleansed with water. MCS pocket filled with Aquacel, new appliance placed. Supplies for ostomy and VAC in room.  Helmut Muster, RN, MSN, CWOCN, CNS-BC, pager [phone]

## 2020-12-04 NOTE — Progress Notes (Signed)
Inpatient Rehabilitation Admissions Coordinator  I met at bedside with patient to discuss goals and expectations of a possible Cir admit. I discussed that her daughter, Janett Billow , is to get back to me to clarify projected caregiver supports after rehab. I await that clarification before proceeding with [place] medicaid [employer] authorization.  Danne Baxter, RN, MSN Rehab Admissions Coordinator [phone] 12/04/2020 2:53 PM

## 2020-12-04 NOTE — Progress Notes (Signed)
PT Cancellation Note  Patient Details Name: Sara [name] MRN: [id number] DOB: [birth year]   Cancelled Treatment:    Reason Eval/Treat Not Completed: Patient declined, no reason specified. The pt states she is unable to participate in therapy at this time due to ongoing nausea and general malaise. Discussed importance of continued mobility to maintain recent progress made with therapies. PT will continue to follow and attempt to return later today as time/schedule allow.   Rolm Baptise, PT, DPT   Acute Rehabilitation Department Pager #: [phone]    [name] 12/04/2020, 3:09 PM
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
## 2020-12-05 ENCOUNTER — Encounter (HOSPITAL_COMMUNITY): Payer: Self-pay

## 2020-12-05 ENCOUNTER — Encounter (HOSPITAL_COMMUNITY): Admission: EM | Disposition: A | Discharge status: Rehabilitation Facility | Payer: Self-pay | Source: Home / Self Care

## 2020-12-05 LAB — BASIC METABOLIC PANEL
Anion gap: 12 (ref 5–15)
Anion gap: 14 (ref 5–15)
BUN: <5 mg/dL — ABNORMAL LOW (ref 6–20)
BUN: <5 mg/dL — ABNORMAL LOW (ref 6–20)
CO2: 24 mmol/L (ref 22–32)
CO2: 21 mmol/L — ABNORMAL LOW (ref 22–32)
Calcium: 8.6 mg/dL — ABNORMAL LOW (ref 8.9–10.3)
Calcium: 8.5 mg/dL — ABNORMAL LOW (ref 8.9–10.3)
Chloride: 105 mmol/L (ref 98–111)
Chloride: 105 mmol/L (ref 98–111)
Creatinine, Ser: 0.71 mg/dL (ref 0.44–1.00)
Creatinine, Ser: 0.65 mg/dL (ref 0.44–1.00)
GFR, Estimated: >60 mL/min (ref >60)
GFR, Estimated: >60 mL/min (ref >60)
Glucose, Bld: 83 mg/dL (ref 70–99)
Glucose, Bld: 81 mg/dL (ref 70–99)
Potassium: 2.3 mmol/L — CL (ref 3.5–5.1)
Potassium: 3.4 mmol/L — ABNORMAL LOW (ref 3.5–5.1)
Sodium: 141 mmol/L (ref 135–145)
Sodium: 140 mmol/L (ref 135–145)

## 2020-12-05 LAB — CBC
HCT: 32.1 % — ABNORMAL LOW (ref 36.0–46.0)
Hemoglobin: 9.8 g/dL — ABNORMAL LOW (ref 12.0–15.0)
MCH: 30.1 pg (ref 26.0–34.0)
MCHC: 30.5 g/dL (ref 30.0–36.0)
MCV: 98.5 fL (ref 80.0–100.0)
Platelets: 363 10*3/uL (ref 150–400)
RBC: 3.26 MIL/uL — ABNORMAL LOW (ref 3.87–5.11)
RDW: 15.5 % (ref 11.5–15.5)
WBC: 6.1 10*3/uL (ref 4.0–10.5)
nRBC: 0 % (ref 0.0–0.2)

## 2020-12-05 LAB — SURGICAL PCR SCREEN
MRSA, PCR: NEGATIVE
Staphylococcus aureus: NEGATIVE

## 2020-12-05 LAB — MAGNESIUM
Magnesium: 1.1 mg/dL — ABNORMAL LOW (ref 1.7–2.4)
Magnesium: 2 mg/dL (ref 1.7–2.4)

## 2020-12-05 SURGERY — OPEN REDUCTION INTERNAL FIXATION (ORIF) DISTAL FEMUR FRACTURE
Anesthesia: General | Laterality: Left

## 2020-12-05 MED ORDER — SODIUM CHLORIDE 0.9 % IV SOLN
1.0000 g | Freq: Three times a day (TID) | INTRAVENOUS | Status: DC
  Administered 2020-12-05 – 2020-12-14 (×24): 1 g via INTRAVENOUS
  Filled 2020-12-05 (×31): qty 1

## 2020-12-05 MED ORDER — MIDAZOLAM HCL 2 MG/2ML IJ SOLN
INTRAMUSCULAR | Status: AC
  Filled 2020-12-05: qty 2

## 2020-12-05 MED ORDER — FENTANYL CITRATE (PF) 250 MCG/5ML IJ SOLN
INTRAMUSCULAR | Status: AC
  Filled 2020-12-05: qty 5

## 2020-12-05 MED ORDER — MAGNESIUM SULFATE 50 % IJ SOLN
3.0000 g | Freq: Once | INTRAVENOUS | Status: AC
  Administered 2020-12-05: 3 g via INTRAVENOUS
  Filled 2020-12-05: qty 6

## 2020-12-05 MED ORDER — POTASSIUM CHLORIDE 10 MEQ/100ML IV SOLN
10.0000 meq | INTRAVENOUS | Status: AC
  Administered 2020-12-05 (×2): 10 meq via INTRAVENOUS
  Filled 2020-12-05 (×4): qty 100

## 2020-12-05 MED ORDER — LIDOCAINE HCL (PF) 2 % IJ SOLN
INTRAMUSCULAR | Status: AC
  Filled 2020-12-05: qty 5

## 2020-12-05 MED ORDER — PROPOFOL 10 MG/ML IV BOLUS
INTRAVENOUS | Status: AC
  Filled 2020-12-05: qty 20

## 2020-12-05 MED ORDER — CHLORHEXIDINE GLUCONATE 0.12 % MT SOLN: 15.0000 mL | Freq: Once | OROMUCOSAL | Status: DC

## 2020-12-05 MED ORDER — MAGNESIUM SULFATE 50 % IJ SOLN: 3.0000 g | Freq: Once | INTRAMUSCULAR | Status: DC

## 2020-12-05 MED ORDER — CHLORHEXIDINE GLUCONATE 0.12 % MT SOLN
OROMUCOSAL | Status: AC
  Filled 2020-12-05: qty 15

## 2020-12-05 MED ORDER — ROCURONIUM BROMIDE 10 MG/ML (PF) SYRINGE
PREFILLED_SYRINGE | INTRAVENOUS | Status: AC
  Filled 2020-12-05: qty 10

## 2020-12-05 MED ORDER — ONDANSETRON HCL 4 MG/2ML IJ SOLN
INTRAMUSCULAR | Status: AC
  Filled 2020-12-05: qty 2

## 2020-12-05 MED ORDER — LACTATED RINGERS IV SOLN: INTRAVENOUS | Status: DC

## 2020-12-05 MED ORDER — POTASSIUM CHLORIDE 10 MEQ/100ML IV SOLN: 10.0000 meq | INTRAVENOUS | Status: DC

## 2020-12-05 MED ORDER — POTASSIUM CHLORIDE 20 MEQ PO PACK: 40.0000 meq | PACK | Freq: Two times a day (BID) | ORAL | Status: DC

## 2020-12-05 MED ORDER — DEXAMETHASONE SODIUM PHOSPHATE 10 MG/ML IJ SOLN
INTRAMUSCULAR | Status: AC
  Filled 2020-12-05: qty 1

## 2020-12-05 NOTE — Progress Notes (Signed)
Orthopaedic Trauma Service (OTS)  Day of Surgery Procedure(s) (LRB): OPEN REDUCTION INTERNAL FIXATION (ORIF) DISTAL FEMUR FRACTURE. 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  Subjective: Patient reports pain as mild.  +Nausea.  Objective: Current Vitals Blood pressure (!) 122/51, pulse 99, temperature (!) 97.4 F (36.3 C), temperature source Axillary, resp. rate 19, height 5\' 5"  (1.651 m), weight 97.5 kg, SpO2 100 %. Vital signs in last 24 hours: Temp:  [97 F (36.1 C)-98.7 F (37.1 C)] 97.4 F (36.3 C) (12/07 0312) Pulse Rate:  [85-99] 99 (12/07 0312) Resp:  [13-20] 19 (12/07 0312) BP: (110-137)/(51-76) 122/51 (12/07 0312) SpO2:  [93 %-100 %] 100 % (12/07 0312) Weight:  [97.5 kg] 97.5 kg (12/07 0120)  Intake/Output from previous day: 12/06 0701 - 12/07 0700 In: 610 [P.O.:510; IV Piggyback:100] Out: 1920 [Urine:1300; Drains:20; Stool:600]  LABS Recent Labs    12/05/20 0653  HGB 9.8*   Recent Labs    12/05/20 0653  WBC 6.1  RBC 3.26*  HCT 32.1*  PLT 363   Recent Labs    12/05/20 0653  NA 141  K 2.3*  CL 105  CO2 24  BUN <5*  CREATININE 0.71  GLUCOSE 83  CALCIUM 8.6*   No results for input(s): LABPT, INR in the last 72 hours.   Physical Exam LLE  Dressing intact, clean, dry  Edema/ swelling controlled  Sens: DPN, SPN, TN intact  Motor: EHL, FHL, and lessor toe ext and flex all intact grossly  Brisk cap refill, warm to touch  Assessment/Plan: Day of Surgery Procedure(s) (LRB): OPEN REDUCTION INTERNAL FIXATION (ORIF) DISTAL FEMUR FRACTURE. 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 Will have to delay surgery today with potassium of 2.3 2. Return Th or Fri after correction. Happy to try later today if can be corrected.  Sat, MD [facility], [facility] [phone]

## 2020-12-05 NOTE — Progress Notes (Signed)
Just received a call from OR stating there is too much going on in the OR for the pt to be able to have sx today.it was also reported that the procedure will be rescheduled for Thursday at 0800. Mayford Knife RN

## 2020-12-05 NOTE — Progress Notes (Signed)
[facility] [place] [facility] RN notified of potassium and surgery cancellation. This RN transported patient back to [facility] [place] [facility] on telemetry.

## 2020-12-05 NOTE — Progress Notes (Signed)
Report called to the OR [phone]
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
## 2020-12-05 NOTE — Progress Notes (Signed)
Short stay called to report to this nurse that surgery would be cancelled today d/t Ka+ 2.3. Pt to return to the floor. Mayford Knife RN

## 2020-12-05 NOTE — Progress Notes (Signed)
CRITICAL VALUE ALERT  Critical Value:  Potassium 2.3  Date & Time Notied:  12/05/2020 0810  Provider Notified: Dr. Mal Amabile and Dr. Carola Frost  Orders Received/Actions taken: Cancel surgery for now.

## 2020-12-06 LAB — BASIC METABOLIC PANEL
Anion gap: 12 (ref 5–15)
Anion gap: 11 (ref 5–15)
BUN: <5 mg/dL — ABNORMAL LOW (ref 6–20)
BUN: <5 mg/dL — ABNORMAL LOW (ref 6–20)
CO2: 22 mmol/L (ref 22–32)
CO2: 24 mmol/L (ref 22–32)
Calcium: 8.9 mg/dL (ref 8.9–10.3)
Calcium: 8.8 mg/dL — ABNORMAL LOW (ref 8.9–10.3)
Chloride: 107 mmol/L (ref 98–111)
Chloride: 102 mmol/L (ref 98–111)
Creatinine, Ser: 0.57 mg/dL (ref 0.44–1.00)
Creatinine, Ser: 0.58 mg/dL (ref 0.44–1.00)
GFR, Estimated: >60 mL/min (ref >60)
GFR, Estimated: >60 mL/min (ref >60)
Glucose, Bld: 83 mg/dL (ref 70–99)
Glucose, Bld: 86 mg/dL (ref 70–99)
Potassium: 2.7 mmol/L — CL (ref 3.5–5.1)
Potassium: 3.5 mmol/L (ref 3.5–5.1)
Sodium: 141 mmol/L (ref 135–145)
Sodium: 137 mmol/L (ref 135–145)

## 2020-12-06 LAB — MAGNESIUM: Magnesium: 1.8 mg/dL (ref 1.7–2.4)

## 2020-12-06 MED ORDER — POTASSIUM CHLORIDE 10 MEQ/100ML IV SOLN
10.0000 meq | INTRAVENOUS | Status: AC
  Administered 2020-12-06 (×2): 10 meq via INTRAVENOUS
  Filled 2020-12-06 (×2): qty 100

## 2020-12-06 MED ORDER — POTASSIUM CHLORIDE CRYS ER 20 MEQ PO TBCR: 40.0000 meq | EXTENDED_RELEASE_TABLET | ORAL | Status: DC

## 2020-12-06 MED ORDER — POTASSIUM CHLORIDE CRYS ER 10 MEQ PO TBCR
10.0000 meq | EXTENDED_RELEASE_TABLET | Freq: Once | ORAL | Status: AC
  Administered 2020-12-06: 10 meq via ORAL
  Filled 2020-12-06: qty 1

## 2020-12-06 MED ORDER — POTASSIUM CHLORIDE 10 MEQ/100ML IV SOLN
10.0000 meq | INTRAVENOUS | Status: DC
  Administered 2020-12-06: 10 meq via INTRAVENOUS
  Filled 2020-12-06: qty 100

## 2020-12-06 MED ORDER — MAGNESIUM OXIDE 400 (241.3 MG) MG PO TABS
400.0000 mg | ORAL_TABLET | Freq: Four times a day (QID) | ORAL | Status: AC
  Administered 2020-12-06 (×2): 400 mg via ORAL
  Filled 2020-12-06 (×2): qty 1

## 2020-12-06 MED ORDER — MAGNESIUM SULFATE 2 GM/50ML IV SOLN: 2.0000 g | Freq: Once | INTRAVENOUS | Status: DC

## 2020-12-06 MED ORDER — POTASSIUM CHLORIDE CRYS ER 20 MEQ PO TBCR
40.0000 meq | EXTENDED_RELEASE_TABLET | Freq: Once | ORAL | Status: AC
  Administered 2020-12-06: 40 meq via ORAL
  Filled 2020-12-06: qty 2

## 2020-12-06 MED ORDER — POTASSIUM CHLORIDE 10 MEQ/100ML IV SOLN: 10.0000 meq | INTRAVENOUS | Status: DC

## 2020-12-06 MED ORDER — POTASSIUM CHLORIDE 20 MEQ PO PACK
40.0000 meq | PACK | ORAL | Status: AC
  Administered 2020-12-06: 40 meq via ORAL
  Filled 2020-12-06: qty 2
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
## 2020-12-06 NOTE — Progress Notes (Signed)
Provider called this nurse and stated pt needed three runs of Ka by 1400 but to run them as slow as necessary for pt. This nurse started the first run running at 50 and pt was unable to tolerate this. So this nurse titrated it down until it was running at 30. Pt stated this was fine. Later during the run of this same bag of Ka pt stated she could no longer tolerated it to please turn it down. At this time this nurse explained that three runs were to be administered by 1400 and that this was already not possible. Pt requested ice pack which this nurse provided and pt reported this was effective. Oral Ka and mag were also administered and pt tolerated these with no nausea. This nurse waited to give pt Trintellix to avoid nausea. This was administered with 1216 Ka. Pt developed nausea after this and vomited a small amount of clear liquid approximately over an hour later. This nurse attempted to administer Ka, mag and meropenem in the afternoon but pt wanted to wait until after she ate her dinner meal to avoid any further nausea. Dinner came very late and this nurse was unable to administer. Night shift nurse administered Ka and mag but not meropenem d/t being to close to another administration.

## 2020-12-06 NOTE — Progress Notes (Signed)
Went to patient room to place PIV. Patient eating breakfast and would like IV team to come back. 
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
## 2020-12-06 NOTE — Progress Notes (Signed)
Pt complained of burning at IV site. Night shift nurse turned Ka down to 13ml/hr and we encouraged pt to drink po Ka. Pt stated that it was starting to make her sick. She requested pain medication at that time. Night shift nurse administered this per order. Wound care nurse reported to this nurse that IVs were beeping. Upon assessment just below IV site was cold. This nurse stopped and saline locked IV. Wound care nurse still at bedside. Mayford Knife RN

## 2020-12-06 NOTE — Consult Note (Signed)
WOC Nurse wound follow up Patient receiving care in [facility]. Wound type:healing abdominal surgical wound Measurement: 8.1 cm x 6.3 cm x 0.7 cm. Right tunnel measures 1.8 cm ; left tunnel measures 0.9 cm. Wound bed: 100% pink Drainage (amount, consistency, odor) serosanginous in cannister Periwound: intact Dressing procedure/placement/frequency: all pieces of white and black foam removed from tunnels and wound bed. White foam placed into each tunnel and over wound bed, topped with black foam. Drape applied, immediate seal obtained. Patient tolerated well.  Ostomy appliance from 2 days ago intact, no signs of impending leakage. MCS pocket piece of Aquacel removed and replaced.  Additional VAC black foams to be requested by unit Diplomatic Services operational officer. Helmut Muster, RN, MSN, CWOCN, CNS-BC, pager [phone]

## 2020-12-07 ENCOUNTER — Inpatient Hospital Stay (HOSPITAL_COMMUNITY): Payer: Medicaid Other | Admitting: Certified Registered Nurse Anesthetist

## 2020-12-07 ENCOUNTER — Encounter (HOSPITAL_COMMUNITY): Admission: EM | Disposition: A | Discharge status: Rehabilitation Facility | Payer: Self-pay | Source: Home / Self Care

## 2020-12-07 ENCOUNTER — Inpatient Hospital Stay (HOSPITAL_COMMUNITY): Payer: Medicaid Other

## 2020-12-07 ENCOUNTER — Encounter (HOSPITAL_COMMUNITY): Payer: Self-pay

## 2020-12-07 DIAGNOSIS — K659 Peritonitis, unspecified: Secondary | ICD-10-CM | POA: Diagnosis not present

## 2020-12-07 DIAGNOSIS — S2243XA Multiple fractures of ribs, bilateral, initial encounter for closed fracture: Secondary | ICD-10-CM | POA: Diagnosis not present

## 2020-12-07 DIAGNOSIS — Z20822 Contact with and (suspected) exposure to covid-19: Secondary | ICD-10-CM | POA: Diagnosis not present

## 2020-12-07 DIAGNOSIS — Z23 Encounter for immunization: Secondary | ICD-10-CM | POA: Diagnosis not present

## 2020-12-07 DIAGNOSIS — F32A Depression, unspecified: Secondary | ICD-10-CM | POA: Diagnosis not present

## 2020-12-07 DIAGNOSIS — J869 Pyothorax without fistula: Secondary | ICD-10-CM | POA: Diagnosis not present

## 2020-12-07 DIAGNOSIS — S52501A Unspecified fracture of the lower end of right radius, initial encounter for closed fracture: Secondary | ICD-10-CM | POA: Diagnosis not present

## 2020-12-07 DIAGNOSIS — S72402K Unspecified fracture of lower end of left femur, subsequent encounter for closed fracture with nonunion: Secondary | ICD-10-CM | POA: Diagnosis not present

## 2020-12-07 DIAGNOSIS — S72462C Displaced supracondylar fracture with intracondylar extension of lower end of left femur, initial encounter for open fracture type IIIA, IIIB, or IIIC: Secondary | ICD-10-CM | POA: Diagnosis not present

## 2020-12-07 DIAGNOSIS — E079 Disorder of thyroid, unspecified: Secondary | ICD-10-CM | POA: Diagnosis not present

## 2020-12-07 DIAGNOSIS — M24562 Contracture, left knee: Secondary | ICD-10-CM | POA: Diagnosis not present

## 2020-12-07 DIAGNOSIS — M86152 Other acute osteomyelitis, left femur: Secondary | ICD-10-CM | POA: Diagnosis not present

## 2020-12-07 DIAGNOSIS — S36892A Contusion of other intra-abdominal organs, initial encounter: Secondary | ICD-10-CM | POA: Diagnosis not present

## 2020-12-07 DIAGNOSIS — S72452N Displaced supracondylar fracture without intracondylar extension of lower end of left femur, subsequent encounter for open fracture type IIIA, IIIB, or IIIC with nonunion: Secondary | ICD-10-CM | POA: Diagnosis not present

## 2020-12-07 DIAGNOSIS — J80 Acute respiratory distress syndrome: Secondary | ICD-10-CM | POA: Diagnosis not present

## 2020-12-07 DIAGNOSIS — D689 Coagulation defect, unspecified: Secondary | ICD-10-CM | POA: Diagnosis not present

## 2020-12-07 DIAGNOSIS — S82142A Displaced bicondylar fracture of left tibia, initial encounter for closed fracture: Secondary | ICD-10-CM | POA: Diagnosis not present

## 2020-12-07 HISTORY — PX: ORIF FEMUR FRACTURE: SHX2119

## 2020-12-07 LAB — BASIC METABOLIC PANEL
Anion gap: 10 (ref 5–15)
BUN: <5 mg/dL — ABNORMAL LOW (ref 6–20)
CO2: 21 mmol/L — ABNORMAL LOW (ref 22–32)
Calcium: 9 mg/dL (ref 8.9–10.3)
Chloride: 107 mmol/L (ref 98–111)
Creatinine, Ser: 0.57 mg/dL (ref 0.44–1.00)
GFR, Estimated: >60 mL/min (ref >60)
Glucose, Bld: 84 mg/dL (ref 70–99)
Potassium: 3.5 mmol/L (ref 3.5–5.1)
Sodium: 138 mmol/L (ref 135–145)

## 2020-12-07 LAB — MAGNESIUM: Magnesium: 1.4 mg/dL — ABNORMAL LOW (ref 1.7–2.4)

## 2020-12-07 SURGERY — OPEN REDUCTION INTERNAL FIXATION (ORIF) DISTAL FEMUR FRACTURE
Anesthesia: General | Site: Leg Upper | Laterality: Left

## 2020-12-07 MED ORDER — SUGAMMADEX SODIUM 200 MG/2ML IV SOLN
INTRAVENOUS | Status: DC | PRN
  Administered 2020-12-07: 200 mg via INTRAVENOUS

## 2020-12-07 MED ORDER — ACETAMINOPHEN 160 MG/5ML PO SOLN: 325.0000 mg | Freq: Once | ORAL | Status: DC | PRN

## 2020-12-07 MED ORDER — CHLORHEXIDINE GLUCONATE 0.12 % MT SOLN
OROMUCOSAL | Status: AC
  Administered 2020-12-07: 15 mL via OROMUCOSAL
  Filled 2020-12-07: qty 15

## 2020-12-07 MED ORDER — LIDOCAINE HCL (PF) 2 % IJ SOLN
INTRAMUSCULAR | Status: AC
  Filled 2020-12-07: qty 5

## 2020-12-07 MED ORDER — PHENYLEPHRINE HCL-NACL 10-0.9 MG/250ML-% IV SOLN
INTRAVENOUS | Status: DC | PRN
  Administered 2020-12-07: 40 ug/min via INTRAVENOUS

## 2020-12-07 MED ORDER — AMISULPRIDE (ANTIEMETIC) 5 MG/2ML IV SOLN: 10.0000 mg | Freq: Once | INTRAVENOUS | Status: DC | PRN

## 2020-12-07 MED ORDER — ONDANSETRON HCL 4 MG/2ML IJ SOLN
INTRAMUSCULAR | Status: DC | PRN
  Administered 2020-12-07: 4 mg via INTRAVENOUS

## 2020-12-07 MED ORDER — POTASSIUM CHLORIDE CRYS ER 20 MEQ PO TBCR
40.0000 meq | EXTENDED_RELEASE_TABLET | Freq: Once | ORAL | Status: AC
  Administered 2020-12-07: 40 meq via ORAL
  Filled 2020-12-07: qty 2

## 2020-12-07 MED ORDER — ACETAMINOPHEN 10 MG/ML IV SOLN
INTRAVENOUS | Status: AC
  Filled 2020-12-07: qty 100

## 2020-12-07 MED ORDER — ONDANSETRON HCL 4 MG/2ML IJ SOLN
INTRAMUSCULAR | Status: AC
  Filled 2020-12-07: qty 2

## 2020-12-07 MED ORDER — SCOPOLAMINE 1 MG/3DAYS TD PT72
MEDICATED_PATCH | TRANSDERMAL | Status: DC | PRN
  Administered 2020-12-07: 1 via TRANSDERMAL

## 2020-12-07 MED ORDER — FENTANYL CITRATE (PF) 250 MCG/5ML IJ SOLN
INTRAMUSCULAR | Status: DC | PRN
  Administered 2020-12-07 (×5): 50 ug via INTRAVENOUS

## 2020-12-07 MED ORDER — PROPOFOL 10 MG/ML IV BOLUS
INTRAVENOUS | Status: DC | PRN
  Administered 2020-12-07: 140 mg via INTRAVENOUS

## 2020-12-07 MED ORDER — LACTATED RINGERS IV SOLN: INTRAVENOUS | Status: DC

## 2020-12-07 MED ORDER — PHENYLEPHRINE 40 MCG/ML (10ML) SYRINGE FOR IV PUSH (FOR BLOOD PRESSURE SUPPORT)
PREFILLED_SYRINGE | INTRAVENOUS | Status: DC | PRN
  Administered 2020-12-07: 40 ug via INTRAVENOUS

## 2020-12-07 MED ORDER — MEPERIDINE HCL 25 MG/ML IJ SOLN: 6.2500 mg | INTRAMUSCULAR | Status: DC | PRN

## 2020-12-07 MED ORDER — DEXAMETHASONE SODIUM PHOSPHATE 10 MG/ML IJ SOLN
INTRAMUSCULAR | Status: DC | PRN
  Administered 2020-12-07: 5 mg via INTRAVENOUS

## 2020-12-07 MED ORDER — ROCURONIUM BROMIDE 10 MG/ML (PF) SYRINGE
PREFILLED_SYRINGE | INTRAVENOUS | Status: DC | PRN
  Administered 2020-12-07: 40 mg via INTRAVENOUS

## 2020-12-07 MED ORDER — HYDROMORPHONE HCL 1 MG/ML IJ SOLN
0.2500 mg | INTRAMUSCULAR | Status: DC | PRN
  Administered 2020-12-07 (×2): 0.5 mg via INTRAVENOUS

## 2020-12-07 MED ORDER — CHLORHEXIDINE GLUCONATE 0.12 % MT SOLN
15.0000 mL | OROMUCOSAL | Status: AC
  Filled 2020-12-07: qty 15

## 2020-12-07 MED ORDER — ACETAMINOPHEN 325 MG PO TABS: 325.0000 mg | ORAL_TABLET | Freq: Once | ORAL | Status: DC | PRN

## 2020-12-07 MED ORDER — FENTANYL CITRATE (PF) 250 MCG/5ML IJ SOLN
INTRAMUSCULAR | Status: AC
  Filled 2020-12-07: qty 5

## 2020-12-07 MED ORDER — SUCCINYLCHOLINE CHLORIDE 200 MG/10ML IV SOSY
PREFILLED_SYRINGE | INTRAVENOUS | Status: AC
  Filled 2020-12-07: qty 10

## 2020-12-07 MED ORDER — CEFAZOLIN SODIUM-DEXTROSE 2-3 GM-%(50ML) IV SOLR
INTRAVENOUS | Status: DC | PRN
  Administered 2020-12-07: 2 g via INTRAVENOUS

## 2020-12-07 MED ORDER — EPINEPHRINE PF 1 MG/ML IJ SOLN
INTRAMUSCULAR | Status: AC
  Filled 2020-12-07: qty 1

## 2020-12-07 MED ORDER — ALBUMIN HUMAN 5 % IV SOLN: INTRAVENOUS | Status: DC | PRN

## 2020-12-07 MED ORDER — BUPIVACAINE HCL (PF) 0.25 % IJ SOLN
INTRAMUSCULAR | Status: AC
  Filled 2020-12-07: qty 30

## 2020-12-07 MED ORDER — DEXAMETHASONE SODIUM PHOSPHATE 10 MG/ML IJ SOLN
INTRAMUSCULAR | Status: AC
  Filled 2020-12-07: qty 1

## 2020-12-07 MED ORDER — ACETAMINOPHEN 10 MG/ML IV SOLN
1000.0000 mg | Freq: Once | INTRAVENOUS | Status: DC | PRN
  Administered 2020-12-07: 1000 mg via INTRAVENOUS

## 2020-12-07 MED ORDER — MIDAZOLAM HCL 2 MG/2ML IJ SOLN
INTRAMUSCULAR | Status: DC | PRN
  Administered 2020-12-07: 2 mg via INTRAVENOUS

## 2020-12-07 MED ORDER — MAGNESIUM SULFATE 4 GM/100ML IV SOLN
4.0000 g | Freq: Once | INTRAVENOUS | Status: AC
  Administered 2020-12-07: 4 g via INTRAVENOUS
  Filled 2020-12-07: qty 100

## 2020-12-07 MED ORDER — 0.9 % SODIUM CHLORIDE (POUR BTL) OPTIME
TOPICAL | Status: DC | PRN
  Administered 2020-12-07: 1000 mL

## 2020-12-07 MED ORDER — HYDROMORPHONE HCL 1 MG/ML IJ SOLN
INTRAMUSCULAR | Status: AC
  Filled 2020-12-07: qty 1

## 2020-12-07 MED ORDER — SCOPOLAMINE 1 MG/3DAYS TD PT72
MEDICATED_PATCH | TRANSDERMAL | Status: AC
  Filled 2020-12-07: qty 1

## 2020-12-07 MED ORDER — MIDAZOLAM HCL 2 MG/2ML IJ SOLN
INTRAMUSCULAR | Status: AC
  Filled 2020-12-07: qty 2

## 2020-12-07 MED ORDER — LIDOCAINE 2% (20 MG/ML) 5 ML SYRINGE
INTRAMUSCULAR | Status: DC | PRN
  Administered 2020-12-07: 40 mg via INTRAVENOUS

## 2020-12-07 MED ORDER — ROCURONIUM BROMIDE 10 MG/ML (PF) SYRINGE
PREFILLED_SYRINGE | INTRAVENOUS | Status: AC
  Filled 2020-12-07: qty 10

## 2020-12-07 MED ORDER — SUCCINYLCHOLINE CHLORIDE 200 MG/10ML IV SOSY
PREFILLED_SYRINGE | INTRAVENOUS | Status: DC | PRN
  Administered 2020-12-07: 120 mg via INTRAVENOUS

## 2020-12-07 MED ORDER — PROPOFOL 10 MG/ML IV BOLUS
INTRAVENOUS | Status: AC
  Filled 2020-12-07: qty 40

## 2020-12-07 SURGICAL SUPPLY — 63 items
BLADE CLIPPER SURG (BLADE) IMPLANT
BNDG ELASTIC 4X5.8 VLCR STR LF (GAUZE/BANDAGES/DRESSINGS) ×2 IMPLANT
BNDG ELASTIC 6X5.8 VLCR STR LF (GAUZE/BANDAGES/DRESSINGS) ×2 IMPLANT
BNDG GAUZE ELAST 4 BULKY (GAUZE/BANDAGES/DRESSINGS) ×2 IMPLANT
BONE CANC CHIPS 40CC CAN1/2 (Bone Implant) ×4 IMPLANT
BRUSH SCRUB EZ PLAIN DRY (MISCELLANEOUS) ×4 IMPLANT
CANISTER SUCT 3000ML PPV (MISCELLANEOUS) ×2 IMPLANT
CHIPS CANC BONE 40CC CAN1/2 (Bone Implant) ×2 IMPLANT
COVER SURGICAL LIGHT HANDLE (MISCELLANEOUS) ×2 IMPLANT
COVER WAND RF STERILE (DRAPES) ×2 IMPLANT
DRAPE C-ARM 42X72 X-RAY (DRAPES) ×2 IMPLANT
DRAPE C-ARMOR (DRAPES) ×2 IMPLANT
DRAPE IMP U-DRAPE 54X76 (DRAPES) ×2 IMPLANT
DRAPE ORTHO SPLIT 77X108 STRL (DRAPES) ×6
DRAPE SURG ORHT 6 SPLT 77X108 (DRAPES) ×3 IMPLANT
DRAPE U-SHAPE 47X51 STRL (DRAPES) ×2 IMPLANT
DRSG ADAPTIC 3X8 NADH LF (GAUZE/BANDAGES/DRESSINGS) ×2 IMPLANT
DRSG MEPILEX BORDER 4X12 (GAUZE/BANDAGES/DRESSINGS) ×2 IMPLANT
DRSG PAD ABDOMINAL 8X10 ST (GAUZE/BANDAGES/DRESSINGS) ×8 IMPLANT
ELECT REM PT RETURN 9FT ADLT (ELECTROSURGICAL) ×2
ELECTRODE REM PT RTRN 9FT ADLT (ELECTROSURGICAL) ×1 IMPLANT
EVACUATOR 1/8 PVC DRAIN (DRAIN) IMPLANT
GAUZE SPONGE 4X4 12PLY STRL (GAUZE/BANDAGES/DRESSINGS) ×2 IMPLANT
GLOVE BIO SURGEON STRL SZ7.5 (GLOVE) ×2 IMPLANT
GLOVE BIO SURGEON STRL SZ8 (GLOVE) ×2 IMPLANT
GLOVE BIOGEL PI IND STRL 7.5 (GLOVE) ×1 IMPLANT
GLOVE BIOGEL PI IND STRL 8 (GLOVE) ×1 IMPLANT
GLOVE BIOGEL PI INDICATOR 7.5 (GLOVE) ×1
GLOVE BIOGEL PI INDICATOR 8 (GLOVE) ×1
GOWN STRL REUS W/ TWL LRG LVL3 (GOWN DISPOSABLE) ×2 IMPLANT
GOWN STRL REUS W/ TWL XL LVL3 (GOWN DISPOSABLE) ×1 IMPLANT
GOWN STRL REUS W/TWL LRG LVL3 (GOWN DISPOSABLE) ×4
GOWN STRL REUS W/TWL XL LVL3 (GOWN DISPOSABLE) ×2
KIT BASIN OR (CUSTOM PROCEDURE TRAY) ×2 IMPLANT
KIT INFUSE LRG II (Orthopedic Implant) ×2 IMPLANT
KIT TURNOVER KIT B (KITS) ×2 IMPLANT
NEEDLE 22X1 1/2 (OR ONLY) (NEEDLE) IMPLANT
NS IRRIG 1000ML POUR BTL (IV SOLUTION) ×2 IMPLANT
PACK TOTAL JOINT (CUSTOM PROCEDURE TRAY) ×2 IMPLANT
PACK UNIVERSAL I (CUSTOM PROCEDURE TRAY) ×2 IMPLANT
PAD ARMBOARD 7.5X6 YLW CONV (MISCELLANEOUS) ×4 IMPLANT
PAD CAST 4YDX4 CTTN HI CHSV (CAST SUPPLIES) ×1 IMPLANT
PADDING CAST COTTON 4X4 STRL (CAST SUPPLIES) ×2
PADDING CAST COTTON 6X4 STRL (CAST SUPPLIES) ×2 IMPLANT
SPONGE LAP 18X18 RF (DISPOSABLE) ×2 IMPLANT
STAPLER VISISTAT 35W (STAPLE) ×2 IMPLANT
SUCTION FRAZIER HANDLE 10FR (MISCELLANEOUS) ×1
SUCTION TUBE FRAZIER 10FR DISP (MISCELLANEOUS) ×1 IMPLANT
SUT ETHILON 2 0 FS 18 (SUTURE) ×4 IMPLANT
SUT PDS AB 0 CT1 27 (SUTURE) ×4 IMPLANT
SUT PDS AB 2-0 CT1 27 (SUTURE) ×2 IMPLANT
SUT PROLENE 0 CT 2 (SUTURE) IMPLANT
SUT VIC AB 0 CT1 27 (SUTURE) ×4
SUT VIC AB 0 CT1 27XBRD ANBCTR (SUTURE) ×2 IMPLANT
SUT VIC AB 1 CT1 27 (SUTURE) ×4
SUT VIC AB 1 CT1 27XBRD ANBCTR (SUTURE) ×2 IMPLANT
SUT VIC AB 2-0 CT1 27 (SUTURE)
SUT VIC AB 2-0 CT1 TAPERPNT 27 (SUTURE) IMPLANT
SYR 20ML ECCENTRIC (SYRINGE) IMPLANT
TOWEL GREEN STERILE (TOWEL DISPOSABLE) ×4 IMPLANT
TOWEL GREEN STERILE FF (TOWEL DISPOSABLE) ×2 IMPLANT
TRAY FOLEY MTR SLVR 16FR STAT (SET/KITS/TRAYS/PACK) IMPLANT
WATER STERILE IRR 1000ML POUR (IV SOLUTION) ×4 IMPLANT

## 2020-12-07 NOTE — Anesthesia Procedure Notes (Signed)
Procedure Name: Intubation Date/Time: 12/07/2020 8:39 AM Performed by: Reece Agar, CRNA Pre-anesthesia Checklist: Patient identified, Emergency Drugs available, Suction available and Patient being monitored Patient Re-evaluated:Patient Re-evaluated prior to induction Oxygen Delivery Method: Circle System Utilized Preoxygenation: Pre-oxygenation with 100% oxygen Induction Type: IV induction Ventilation: Mask ventilation without difficulty Laryngoscope Size: Mac and 3 Grade View: Grade I Tube type: Oral Tube size: 7.0 mm Number of attempts: 1 Airway Equipment and Method: Stylet Placement Confirmation: ETT inserted through vocal cords under direct vision,  positive ETCO2 and breath sounds checked- equal and bilateral Secured at: 21 cm Tube secured with: Tape Dental Injury: Teeth and Oropharynx as per pre-operative assessment
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
## 2020-12-07 NOTE — Transfer of Care (Signed)
Immediate Anesthesia Transfer of Care Note  Patient: Sara [name]  Procedure(s) Performed: OPEN REDUCTION INTERNAL FIXATION (ORIF) DISTAL FEMUR FRACTURE : REPAIR OF LEFT DISTAL FEMUR NONUNION (Left Leg Upper)  Patient Location: PACU  Anesthesia Type:General  Level of Consciousness: awake and alert   Airway & Oxygen Therapy: Patient Spontanous Breathing and Patient connected to face mask oxygen  Post-op Assessment: Report given to RN and Post -op Vital signs reviewed and stable  Post vital signs: Reviewed and stable  Last Vitals:  Vitals Value Taken Time  BP 122/77 12/07/20 0959  Temp    Pulse 87 12/07/20 1002  Resp 18 12/07/20 1002  SpO2 96 % 12/07/20 1002  Vitals shown include unvalidated device data.  Last Pain:  Vitals:   12/07/20 0434  TempSrc: Axillary  PainSc:       Patients Stated Pain Goal: 4 (12/06/20 2000)  Complications: No complications documented.

## 2020-12-07 NOTE — Anesthesia Postprocedure Evaluation (Signed)
Anesthesia Post Note  Patient: [name]  Procedure(s) Performed: OPEN REDUCTION INTERNAL FIXATION (ORIF) DISTAL FEMUR FRACTURE : REPAIR OF LEFT DISTAL FEMUR NONUNION (Left Leg Upper)     Patient location during evaluation: PACU Anesthesia Type: General Level of consciousness: awake and alert Pain management: pain level controlled Vital Signs Assessment: post-procedure vital signs reviewed and stable Respiratory status: spontaneous breathing, nonlabored ventilation, respiratory function stable and patient connected to nasal cannula oxygen Cardiovascular status: blood pressure returned to baseline and stable Postop Assessment: no apparent nausea or vomiting Anesthetic complications: no   No complications documented.  Last Vitals:  Vitals:   12/07/20 1130 12/07/20 1608  BP: 109/61 102/61  Pulse: 83 82  Resp: 10 11  Temp: 36.5 C 36.7 C  SpO2: 96% 98%    Last Pain:  Vitals:   12/07/20 1608  TempSrc: Oral  PainSc:                  Shelton Silvas

## 2020-12-07 NOTE — Progress Notes (Signed)
Inpatient Rehabilitation Admissions Coordinator  I contacted pt's daughter, [name], by phone to clarify caregiver support. Family will provide 24/7 physical care of patient after any rehab venue stay. I await therapy assessments postoperatively and then will begin insurance approval with [place] medicaid [employer] for a possible rehab admit next week pending insurance approval. Son- in -law has tested positive COVID 12/9 therefore [name] did not come visit today.  Ottie Glazier, RN, MSN Rehab Admissions Coordinator [phone] 12/07/2020 1:00 PM

## 2020-12-07 NOTE — Progress Notes (Signed)
I discussed with the patient the risks and benefits of surgery for left femur nonunion repair, including the possibility of infection, nerve injury, vessel injury, wound breakdown, arthritis, symptomatic hardware, DVT/ PE, loss of motion, malunion, nonunion, and need for further surgery among others.  She acknowledged these risks and wished to proceed.  Myrene Galas, MD [facility], [facility] [phone]
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
## 2020-12-08 ENCOUNTER — Encounter (HOSPITAL_COMMUNITY): Payer: Self-pay | Admitting: Orthopedic Surgery

## 2020-12-08 DIAGNOSIS — J90 Pleural effusion, not elsewhere classified: Secondary | ICD-10-CM | POA: Diagnosis not present

## 2020-12-08 DIAGNOSIS — E876 Hypokalemia: Secondary | ICD-10-CM | POA: Diagnosis not present

## 2020-12-08 MED ORDER — MAGNESIUM OXIDE 400 (241.3 MG) MG PO TABS
400.0000 mg | ORAL_TABLET | Freq: Every day | ORAL | Status: DC
  Administered 2020-12-09 – 2020-12-11 (×2): 400 mg via ORAL
  Filled 2020-12-08 (×2): qty 1

## 2020-12-08 MED ORDER — PROMETHAZINE HCL 25 MG PO TABS: 12.5000 mg | ORAL_TABLET | Freq: Four times a day (QID) | ORAL | Status: DC | PRN

## 2020-12-08 MED ORDER — ONDANSETRON HCL 4 MG PO TABS
4.0000 mg | ORAL_TABLET | Freq: Four times a day (QID) | ORAL | Status: DC | PRN
  Administered 2020-12-08 – 2020-12-12 (×5): 4 mg via ORAL
  Filled 2020-12-08 (×5): qty 1
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
## 2020-12-08 NOTE — Progress Notes (Signed)
PT Cancellation Note  Patient Details Name: Sara [name] MRN: [id number] DOB: 07-18-1966   Cancelled Treatment:    Reason Eval/Treat Not Completed: Patient declined, no reason specified.  Pt politely refused with multiple times to encourage up, stating just too painful today.  Will see as able 12/11. 12/08/2020  Jacinto Halim., PT Acute Rehabilitation Services [phone]  (pager) [phone]  (office)    Sara [name] 12/08/2020, 2:43 PM
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
## 2020-12-08 NOTE — Progress Notes (Signed)
Inpatient Rehabilitation Admissions Coordinator  I await postoperative therapy assessments to begin insurance authorization for a possible [facility] admit next week.  Ottie Glazier, RN, MSN Rehab Admissions Coordinator [phone] 12/08/2020 1:38 PM

## 2020-12-08 NOTE — Consult Note (Signed)
WOC Nurse wound follow up Patient receiving care in [facility]. Wound type: healing abdominal surgical wound Measurement: deferred Wound bed: 100% pink Drainage (amount, consistency, odor) serosanginous in cannister Periwound: intact Dressing procedure/placement/frequency: all pieces of white foam removed from tunnels and from wound bed, as well as overlying black foam. Tunnels and wound bed filled with white foam. One piece of black foam placed over white foam, drape applied, immediate seal obtained.  WOC Nurse ostomy follow up Stoma type/location: LUQ colostomy Stomal assessment/size: unchaged Peristomal assessment: pocket of MCS remains,   3.5 cm depth today. Aquacel removed from pocket. New strip of Aquacel placed into the pocket. Barrier ring applied, then skin barrier. Treatment options for stomal/peristomal skin: barrier ring Output: thin  Ostomy pouching: 2pc. 2 and 3/4 inches Education provided: none Enrolled patient in [employer] Discharge program: No Additional VAC and ostomy supplies in room. Helmut Muster, RN, MSN, CWOCN, CNS-BC, pager [phone]

## 2020-12-08 NOTE — Plan of Care (Signed)
Problem: Education: Goal: Knowledge of General Education information will improve Description: Including pain rating scale, medication(s)/side effects and non-pharmacologic comfort measures Outcome: Progressing   Problem: Health Behavior/Discharge Planning: Goal: Ability to manage health-related needs will improve Outcome: Progressing   Problem: Clinical Measurements: Goal: Ability to maintain clinical measurements within normal limits will improve Outcome: Progressing Goal: Will remain free from infection Outcome: Progressing Goal: Diagnostic test results will improve Outcome: Progressing Goal: Respiratory complications will improve Outcome: Progressing Goal: Cardiovascular complication will be avoided Outcome: Progressing   Problem: Activity: Goal: Risk for activity intolerance will decrease Outcome: Progressing   Problem: Nutrition: Goal: Adequate nutrition will be maintained Outcome: Progressing   Problem: Coping: Goal: Level of anxiety will decrease Outcome: Progressing   Problem: Elimination: Goal: Will not experience complications related to bowel motility Outcome: Progressing Goal: Will not experience complications related to urinary retention Outcome: Progressing   Problem: Pain Managment: Goal: General experience of comfort will improve Outcome: Progressing   Problem: Safety: Goal: Ability to remain free from injury will improve Outcome: Progressing   Problem: Skin Integrity: Goal: Risk for impaired skin integrity will decrease Outcome: Progressing   Problem: Activity: Goal: Ability to tolerate increased activity will improve Outcome: Progressing   Problem: Respiratory: Goal: Ability to maintain a clear airway and adequate ventilation will improve Outcome: Progressing   Problem: Role Relationship: Goal: Method of communication will improve Outcome: Progressing   Problem: Activity: Goal: Ability to tolerate increased activity will  improve Outcome: Progressing   Problem: Respiratory: Goal: Ability to maintain a clear airway and adequate ventilation will improve Outcome: Progressing   Problem: Role Relationship: Goal: Method of communication will improve Outcome: Progressing   Problem: Education: Goal: Verbalization of understanding the information provided (i.e., activity precautions, restrictions, etc) will improve Outcome: Progressing Goal: Individualized Educational Video(s) Outcome: Progressing   Problem: Activity: Goal: Ability to ambulate and perform ADLs will improve Outcome: Progressing   Problem: Clinical Measurements: Goal: Postoperative complications will be avoided or minimized Outcome: Progressing   Problem: [place]-Concept: Goal: Ability to maintain and perform role responsibilities to the fullest extent possible will improve Outcome: Progressing   Problem: Pain Management: Goal: Pain level will decrease Outcome: Progressing   Problem: Activity: Goal: Ability to perform activities at highest level will improve Outcome: Progressing Goal: Ability to avoid complications of mobility impairment will improve Outcome: Progressing Goal: Ability to tolerate increased activity will improve Outcome: Progressing Goal: Will remain free from falls Outcome: Progressing   Problem: Respiratory: Goal: Ability to maintain adequate ventilation will improve Outcome: Progressing   Problem: Tissue Perfusion: Goal: Hemodynamically stable with effective tissue perfusion will improve Outcome: Progressing Goal: Postoperative complications will be avoided or minimized Outcome: Progressing   Problem: Skin Integrity: Goal: Ability to maintain adequate tissue integrity will improve Outcome: Progressing   Problem: Infection: Goal: Risk for infection will decrease (spleen) Outcome: Progressing   Problem: Bowel/Gastric: Goal: Gastrointestinal status for postoperative course will improve Outcome:  Progressing Goal: GI tract motility and GI tissue perfusion will improve Outcome: Progressing Goal: Ability to demonstrate the techniques of an individualized bowel program will improve Outcome: Progressing   Problem: Urinary Elimination: Goal: Ability to achieve a regular elimination pattern will improve Outcome: Progressing   Problem: Fluid Volume: Goal: Return to normovolemic state will improve Outcome: Progressing   Problem: Metabolic: Goal: Ability to maintain a metabolic balance will improve Outcome: Progressing   Problem: Education: Goal: Knowledge of the prescribed therapeutic regimen will improve Outcome: Progressing Goal: Knowledge of injury and care (of  blunt abdominal trauma) will improve Outcome: Progressing   Problem: Coping: Goal: Exhibits appropriate coping mechanism and reduced anxiety resulting from physical and emotional stress Outcome: Progressing   Problem: Manso-Concept: Goal: Ability to acknowledge body changes will improve Outcome: Progressing

## 2020-12-08 NOTE — Progress Notes (Signed)
OT Cancellation Note  Patient Details Name: Sara [name] MRN: [id number] DOB: [birth year]   Cancelled Treatment:    Reason Eval/Treat Not Completed: Pain limiting ability to participate.  Will reattempt.  Eber Jones., OTR/L Acute Rehabilitation Services Pager [phone] Office [phone]   Jeani Hawking M 12/08/2020, 2:50 PM

## 2020-12-09 DIAGNOSIS — E876 Hypokalemia: Secondary | ICD-10-CM | POA: Diagnosis not present

## 2020-12-09 DIAGNOSIS — J90 Pleural effusion, not elsewhere classified: Secondary | ICD-10-CM | POA: Diagnosis not present

## 2020-12-09 NOTE — Plan of Care (Signed)
Problem: Education: Goal: Knowledge of General Education information will improve Description: Including pain rating scale, medication(s)/side effects and non-pharmacologic comfort measures Outcome: Progressing   Problem: Health Behavior/Discharge Planning: Goal: Ability to manage health-related needs will improve Outcome: Progressing   Problem: Clinical Measurements: Goal: Ability to maintain clinical measurements within normal limits will improve Outcome: Progressing Goal: Will remain free from infection Outcome: Progressing Goal: Diagnostic test results will improve Outcome: Progressing Goal: Respiratory complications will improve Outcome: Progressing Goal: Cardiovascular complication will be avoided Outcome: Progressing   Problem: Activity: Goal: Risk for activity intolerance will decrease Outcome: Progressing   Problem: Nutrition: Goal: Adequate nutrition will be maintained Outcome: Progressing   Problem: Coping: Goal: Level of anxiety will decrease Outcome: Progressing   Problem: Elimination: Goal: Will not experience complications related to bowel motility Outcome: Progressing Goal: Will not experience complications related to urinary retention Outcome: Progressing   Problem: Pain Managment: Goal: General experience of comfort will improve Outcome: Progressing   Problem: Safety: Goal: Ability to remain free from injury will improve Outcome: Progressing   Problem: Skin Integrity: Goal: Risk for impaired skin integrity will decrease Outcome: Progressing   Problem: Activity: Goal: Ability to tolerate increased activity will improve Outcome: Progressing   Problem: Respiratory: Goal: Ability to maintain a clear airway and adequate ventilation will improve Outcome: Progressing   Problem: Role Relationship: Goal: Method of communication will improve Outcome: Progressing   Problem: Activity: Goal: Ability to tolerate increased activity will  improve Outcome: Progressing   Problem: Respiratory: Goal: Ability to maintain a clear airway and adequate ventilation will improve Outcome: Progressing   Problem: Role Relationship: Goal: Method of communication will improve Outcome: Progressing   Problem: Education: Goal: Verbalization of understanding the information provided (i.e., activity precautions, restrictions, etc) will improve Outcome: Progressing Goal: Individualized Educational Video(s) Outcome: Progressing   Problem: Activity: Goal: Ability to ambulate and perform ADLs will improve Outcome: Progressing   Problem: Clinical Measurements: Goal: Postoperative complications will be avoided or minimized Outcome: Progressing   Problem: [place]l-Concept: Goal: Ability to maintain and perform role responsibilities to the fullest extent possible will improve Outcome: Progressing   Problem: Pain Management: Goal: Pain level will decrease Outcome: Progressing   Problem: Activity: Goal: Ability to perform activities at highest level will improve Outcome: Progressing Goal: Ability to avoid complications of mobility impairment will improve Outcome: Progressing Goal: Ability to tolerate increased activity will improve Outcome: Progressing Goal: Will remain free from falls Outcome: Progressing   Problem: Respiratory: Goal: Ability to maintain adequate ventilation will improve Outcome: Progressing   Problem: Tissue Perfusion: Goal: Hemodynamically stable with effective tissue perfusion will improve Outcome: Progressing Goal: Postoperative complications will be avoided or minimized Outcome: Progressing   Problem: Skin Integrity: Goal: Ability to maintain adequate tissue integrity will improve Outcome: Progressing   Problem: Infection: Goal: Risk for infection will decrease (spleen) Outcome: Progressing   Problem: Bowel/Gastric: Goal: Gastrointestinal status for postoperative course will improve Outcome:  Progressing Goal: GI tract motility and GI tissue perfusion will improve Outcome: Progressing Goal: Ability to demonstrate the techniques of an individualized bowel program will improve Outcome: Progressing   Problem: Urinary Elimination: Goal: Ability to achieve a regular elimination pattern will improve Outcome: Progressing   Problem: Fluid Volume: Goal: Return to normovolemic state will improve Outcome: Progressing   Problem: Metabolic: Goal: Ability to maintain a metabolic balance will improve Outcome: Progressing   Problem: Education: Goal: Knowledge of the prescribed therapeutic regimen will improve Outcome: Progressing Goal: Knowledge of injury and care (of   blunt abdominal trauma) will improve Outcome: Progressing   Problem: Coping: Goal: Exhibits appropriate coping mechanism and reduced anxiety resulting from physical and emotional stress Outcome: Progressing   Problem: Manso-Concept: Goal: Ability to acknowledge body changes will improve Outcome: Progressing

## 2020-12-09 NOTE — Plan of Care (Signed)
Problem: Education: Goal: Knowledge of General Education information will improve Description: Including pain rating scale, medication(s)/side effects and non-pharmacologic comfort measures Outcome: Progressing   Problem: Health Behavior/Discharge Planning: Goal: Ability to manage health-related needs will improve Outcome: Progressing   Problem: Clinical Measurements: Goal: Ability to maintain clinical measurements within normal limits will improve Outcome: Progressing Goal: Will remain free from infection Outcome: Progressing Goal: Diagnostic test results will improve Outcome: Progressing Goal: Respiratory complications will improve Outcome: Progressing Goal: Cardiovascular complication will be avoided Outcome: Progressing   Problem: Activity: Goal: Risk for activity intolerance will decrease Outcome: Progressing   Problem: Nutrition: Goal: Adequate nutrition will be maintained Outcome: Progressing   Problem: Coping: Goal: Level of anxiety will decrease Outcome: Progressing   Problem: Elimination: Goal: Will not experience complications related to bowel motility Outcome: Progressing Goal: Will not experience complications related to urinary retention Outcome: Progressing   Problem: Pain Managment: Goal: General experience of comfort will improve Outcome: Progressing   Problem: Safety: Goal: Ability to remain free from injury will improve Outcome: Progressing   Problem: Skin Integrity: Goal: Risk for impaired skin integrity will decrease Outcome: Progressing   Problem: Activity: Goal: Ability to tolerate increased activity will improve Outcome: Progressing   Problem: Respiratory: Goal: Ability to maintain a clear airway and adequate ventilation will improve Outcome: Progressing   Problem: Role Relationship: Goal: Method of communication will improve Outcome: Progressing   Problem: Activity: Goal: Ability to tolerate increased activity will  improve Outcome: Progressing   Problem: Respiratory: Goal: Ability to maintain a clear airway and adequate ventilation will improve Outcome: Progressing   Problem: Role Relationship: Goal: Method of communication will improve Outcome: Progressing   Problem: Education: Goal: Verbalization of understanding the information provided (i.e., activity precautions, restrictions, etc) will improve Outcome: Progressing Goal: Individualized Educational Video(s) Outcome: Progressing   Problem: Activity: Goal: Ability to ambulate and perform ADLs will improve Outcome: Progressing   Problem: Clinical Measurements: Goal: Postoperative complications will be avoided or minimized Outcome: Progressing   Problem: [place]-Concept: Goal: Ability to maintain and perform role responsibilities to the fullest extent possible will improve Outcome: Progressing   Problem: Pain Management: Goal: Pain level will decrease Outcome: Progressing   Problem: Activity: Goal: Ability to perform activities at highest level will improve Outcome: Progressing Goal: Ability to avoid complications of mobility impairment will improve Outcome: Progressing Goal: Ability to tolerate increased activity will improve Outcome: Progressing Goal: Will remain free from falls Outcome: Progressing   Problem: Respiratory: Goal: Ability to maintain adequate ventilation will improve Outcome: Progressing   Problem: Tissue Perfusion: Goal: Hemodynamically stable with effective tissue perfusion will improve Outcome: Progressing Goal: Postoperative complications will be avoided or minimized Outcome: Progressing   Problem: Skin Integrity: Goal: Ability to maintain adequate tissue integrity will improve Outcome: Progressing   Problem: Infection: Goal: Risk for infection will decrease (spleen) Outcome: Progressing   Problem: Bowel/Gastric: Goal: Gastrointestinal status for postoperative course will improve Outcome:  Progressing Goal: GI tract motility and GI tissue perfusion will improve Outcome: Progressing Goal: Ability to demonstrate the techniques of an individualized bowel program will improve Outcome: Progressing   Problem: Urinary Elimination: Goal: Ability to achieve a regular elimination pattern will improve Outcome: Progressing   Problem: Fluid Volume: Goal: Return to normovolemic state will improve Outcome: Progressing   Problem: Metabolic: Goal: Ability to maintain a metabolic balance will improve Outcome: Progressing   Problem: Education: Goal: Knowledge of the prescribed therapeutic regimen will improve Outcome: Progressing Goal: Knowledge of injury and care (of   blunt abdominal trauma) will improve Outcome: Progressing   Problem: Coping: Goal: Exhibits appropriate coping mechanism and reduced anxiety resulting from physical and emotional stress Outcome: Progressing   Problem: Manso-Concept: Goal: Ability to acknowledge body changes will improve Outcome: Progressing
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
## 2020-12-10 DIAGNOSIS — E876 Hypokalemia: Secondary | ICD-10-CM | POA: Diagnosis not present

## 2020-12-10 DIAGNOSIS — J90 Pleural effusion, not elsewhere classified: Secondary | ICD-10-CM | POA: Diagnosis not present
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
## 2020-12-10 NOTE — Progress Notes (Signed)
Patient is refusing vitals and care today. Patient allowed "some" medications at 1115am but has otherwise stated she wasn't ready and just wanted to sleep. No acute distress noted.

## 2020-12-11 DIAGNOSIS — R531 Weakness: Secondary | ICD-10-CM | POA: Diagnosis not present

## 2020-12-11 DIAGNOSIS — Z515 Encounter for palliative care: Secondary | ICD-10-CM | POA: Diagnosis not present

## 2020-12-11 DIAGNOSIS — E876 Hypokalemia: Secondary | ICD-10-CM | POA: Diagnosis not present

## 2020-12-11 DIAGNOSIS — J90 Pleural effusion, not elsewhere classified: Secondary | ICD-10-CM | POA: Diagnosis not present

## 2020-12-11 DIAGNOSIS — T07XXXA Unspecified multiple injuries, initial encounter: Secondary | ICD-10-CM | POA: Diagnosis not present
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
## 2020-12-11 NOTE — Consult Note (Signed)
WOC Nurse wound follow up Patient receiving care in [facility] 4N[facility]. Wound type: healing surgical wound to abdomen Measurement: deferred Wound bed: 100% pink Drainage (amount, consistency, odor) serosanginous in cannister Periwound: intact Dressing procedure/placement/frequency: all pieces of white and black foam removed from tunnels and wound bed. Tunnels filled with pieces of white foam, as was wound bed, topped with black foam. Drape applied, immediate seal obtained.   For the ostomy, the piece of Aquacel placed into the MCS pocket was removed and a new one place, then patched with half of barrier ring.  Supplies for ostomy in room. I have provided the [place] with [employer] numbers for additional white and black foam, and barrier rings. Patient slept through much of the care provided. Helmut Muster, RN, MSN, CWOCN, CNS-BC, pager [phone]
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
## 2020-12-11 NOTE — Progress Notes (Addendum)
Inpatient Rehabilitation Admissions Coordinator  I await patient's ability to mobilize up out of bed with therapy before pursuing Insurance approval for a possible Cir admit. Noted issues with her bed over the weekend. Discussed with nursing the need to assist with getting patient up out of bed.  Ottie Glazier, RN, MSN Rehab Admissions Coordinator [phone] 12/11/2020 11:29 AM

## 2020-12-11 NOTE — Progress Notes (Signed)
Patient ID: Sara [name], female   DOB: 1966/09/24, 54 y.o.   MRN: [id number]  This NP visited patient at the bedside as a follow up to for palliative medicine needs and emotional support.   She is in bed. She appears weak but tells she is "doing ok since her surgery"   Ongoing education regarding the importance of  personal responsibility in treatment plan.  Discussed importance of Corita shifting her weight and rolling side to side to relieve pressure on sensitive areas and getting OOB.   Education regarding Active ROM exercises.  Will ask nursing to ensure that patient gets OOB to chair daily   Education offered regarding the importance of patient participation and buy in to overall treatment plan, and the choices patients face regarding acceptance of medical interventions and the consequences of these choices.  Education offered regarding positive [place] talk and motivation.    Questions and concerns addressed   Discussed with bedside RN   PMT will continue to support holistically  Total time spent on the unit was 15 minutes  Greater than 50% of the time was spent in counseling and coordination of care  [name] NP  Palliative Medicine Team Team Phone # [phone] Pager [phone]

## 2020-12-12 DIAGNOSIS — J90 Pleural effusion, not elsewhere classified: Secondary | ICD-10-CM | POA: Diagnosis not present

## 2020-12-12 DIAGNOSIS — E876 Hypokalemia: Secondary | ICD-10-CM | POA: Diagnosis not present

## 2020-12-12 LAB — AEROBIC/ANAEROBIC CULTURE W GRAM STAIN (SURGICAL/DEEP WOUND)
Culture: NO GROWTH
Gram Stain: NONE SEEN

## 2020-12-12 NOTE — Progress Notes (Signed)
Inpatient Rehabilitation Admissions Coordinator   I will begin insurance authorization with [place] [employer] today for possible Cir admit pending their approval.  Ottie Glazier, RN, MSN Rehab Admissions Coordinator [phone] 12/12/2020 8:44 AM
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
## 2020-12-13 DIAGNOSIS — E876 Hypokalemia: Secondary | ICD-10-CM | POA: Diagnosis not present

## 2020-12-13 DIAGNOSIS — J90 Pleural effusion, not elsewhere classified: Secondary | ICD-10-CM | POA: Diagnosis not present

## 2020-12-13 LAB — BASIC METABOLIC PANEL
Anion gap: 9 (ref 5–15)
BUN: <5 mg/dL — ABNORMAL LOW (ref 6–20)
CO2: 23 mmol/L (ref 22–32)
Calcium: 8.7 mg/dL — ABNORMAL LOW (ref 8.9–10.3)
Chloride: 105 mmol/L (ref 98–111)
Creatinine, Ser: 0.44 mg/dL (ref 0.44–1.00)
GFR, Estimated: >60 mL/min (ref >60)
Glucose, Bld: 95 mg/dL (ref 70–99)
Potassium: 2.8 mmol/L — ABNORMAL LOW (ref 3.5–5.1)
Sodium: 137 mmol/L (ref 135–145)

## 2020-12-13 LAB — CBC
HCT: 31.6 % — ABNORMAL LOW (ref 36.0–46.0)
Hemoglobin: 10 g/dL — ABNORMAL LOW (ref 12.0–15.0)
MCH: 30.1 pg (ref 26.0–34.0)
MCHC: 31.6 g/dL (ref 30.0–36.0)
MCV: 95.2 fL (ref 80.0–100.0)
Platelets: 431 10*3/uL — ABNORMAL HIGH (ref 150–400)
RBC: 3.32 MIL/uL — ABNORMAL LOW (ref 3.87–5.11)
RDW: 14.9 % (ref 11.5–15.5)
WBC: 6.8 10*3/uL (ref 4.0–10.5)
nRBC: 0 % (ref 0.0–0.2)

## 2020-12-13 LAB — MAGNESIUM: Magnesium: 1.5 mg/dL — ABNORMAL LOW (ref 1.7–2.4)

## 2020-12-13 MED ORDER — POTASSIUM CHLORIDE CRYS ER 20 MEQ PO TBCR
40.0000 meq | EXTENDED_RELEASE_TABLET | Freq: Four times a day (QID) | ORAL | Status: AC
  Administered 2020-12-13 (×2): 40 meq via ORAL
  Filled 2020-12-13 (×2): qty 2

## 2020-12-13 MED ORDER — POTASSIUM CHLORIDE CRYS ER 20 MEQ PO TBCR
40.0000 meq | EXTENDED_RELEASE_TABLET | ORAL | Status: DC
  Filled 2020-12-13: qty 2
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
## 2020-12-13 NOTE — Progress Notes (Signed)
Inpatient Rehabilitation Admissions Coordinator  I await insurance determination for a  possible Cir admit.  Ottie Glazier, RN, MSN Rehab Admissions Coordinator [phone] 12/13/2020 11:54 AM
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
## 2020-12-13 NOTE — Consult Note (Signed)
WOC Nurse wound follow up Patient receiving care in [facility] 4N1. Patient rested with eyes closed through care provided. Wound type: healing abdominal surgical wound Measurement: 7.8 cm x 5.5 cm x 0.5 cm. Right tunnel measures 0.8 cm; left tunnel measures 0.5 cm. Wound bed: 100% pink, clean Drainage (amount, consistency, odor) serous in cannister Periwound: intact Dressing procedure/placement/frequency: all pieces of white and black foam removed from tunnels and wound bed. One piece of white foam placed into each tunnel, wound bed covered with white foam. Black foam placed on top; drape applied; immediate seal obtained.  WOC Nurse ostomy follow up Stoma type/location: LUQ colostomy Stomal assessment/size: unchanged in size, moist, productive MCS pocket now measures 3.6 cm deep extending from 3 - 4 o'clock. Piece of Aquacel packing removed from pocket. New piece placed, then barrier ring and skin barrier, new pouch as well. Peristomal assessment:  Intact except for MCS pocket. Treatment options for stomal/peristomal skin: barrier ring Output: thin, yellowish  Ostomy pouching: 2pc. Flat 2 3/4 inches, Lawson #s [phone], [phone] and [phone] for barrier rings Education provided: none Enrolled patient in [place] [employer] Discharge program: No Helmut Muster, RN, MSN, [employer], CNS-BC, pager [phone]
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
## 2020-12-13 NOTE — Progress Notes (Signed)
Pt refused 1400 potassium d/t nausea unable to admin Zofran until 1600. Pt educated cont to refuse. Leary Roca, PA-C notified. Potassium admin time adjusted to be given with Zofran.

## 2020-12-14 ENCOUNTER — Inpatient Hospital Stay (HOSPITAL_COMMUNITY)
Admission: RE | Admit: 2020-12-14 | Discharge: 2021-01-17 | DRG: 560 | Disposition: A | Discharge status: Home Health | Payer: Medicaid Other | Source: Intra-hospital | Attending: Physical Medicine and Rehabilitation | Admitting: Physical Medicine and Rehabilitation

## 2020-12-14 ENCOUNTER — Inpatient Hospital Stay (HOSPITAL_COMMUNITY)
Admission: RE | Admit: 2020-12-14 | Payer: No Typology Code available for payment source | Source: Intra-hospital | Admitting: Physical Medicine and Rehabilitation

## 2020-12-14 ENCOUNTER — Encounter: Payer: Self-pay | Admitting: Podiatry

## 2020-12-14 ENCOUNTER — Encounter (HOSPITAL_COMMUNITY): Payer: Self-pay | Admitting: Physical Medicine and Rehabilitation

## 2020-12-14 ENCOUNTER — Other Ambulatory Visit: Payer: Self-pay

## 2020-12-14 DIAGNOSIS — T148XXA Other injury of unspecified body region, initial encounter: Secondary | ICD-10-CM

## 2020-12-14 DIAGNOSIS — S52501D Unspecified fracture of the lower end of right radius, subsequent encounter for closed fracture with routine healing: Secondary | ICD-10-CM

## 2020-12-14 DIAGNOSIS — Z6834 Body mass index (BMI) 34.0-34.9, adult: Secondary | ICD-10-CM

## 2020-12-14 DIAGNOSIS — Z933 Colostomy status: Secondary | ICD-10-CM | POA: Diagnosis not present

## 2020-12-14 DIAGNOSIS — I9589 Other hypotension: Secondary | ICD-10-CM | POA: Diagnosis not present

## 2020-12-14 DIAGNOSIS — S82832D Other fracture of upper and lower end of left fibula, subsequent encounter for closed fracture with routine healing: Secondary | ICD-10-CM | POA: Diagnosis not present

## 2020-12-14 DIAGNOSIS — S72491D Other fracture of lower end of right femur, subsequent encounter for closed fracture with routine healing: Secondary | ICD-10-CM | POA: Diagnosis not present

## 2020-12-14 DIAGNOSIS — F909 Attention-deficit hyperactivity disorder, unspecified type: Secondary | ICD-10-CM | POA: Diagnosis present

## 2020-12-14 DIAGNOSIS — S7292XE Unspecified fracture of left femur, subsequent encounter for open fracture type I or II with routine healing: Principal | ICD-10-CM

## 2020-12-14 DIAGNOSIS — T07XXXA Unspecified multiple injuries, initial encounter: Secondary | ICD-10-CM | POA: Diagnosis not present

## 2020-12-14 DIAGNOSIS — E440 Moderate protein-calorie malnutrition: Secondary | ICD-10-CM | POA: Diagnosis not present

## 2020-12-14 DIAGNOSIS — R112 Nausea with vomiting, unspecified: Secondary | ICD-10-CM | POA: Diagnosis not present

## 2020-12-14 DIAGNOSIS — S82141S Displaced bicondylar fracture of right tibia, sequela: Secondary | ICD-10-CM | POA: Diagnosis not present

## 2020-12-14 DIAGNOSIS — Z20822 Contact with and (suspected) exposure to covid-19: Secondary | ICD-10-CM | POA: Diagnosis not present

## 2020-12-14 DIAGNOSIS — B961 Klebsiella pneumoniae [K. pneumoniae] as the cause of diseases classified elsewhere: Secondary | ICD-10-CM | POA: Diagnosis present

## 2020-12-14 DIAGNOSIS — Z881 Allergy status to other antibiotic agents status: Secondary | ICD-10-CM | POA: Diagnosis not present

## 2020-12-14 DIAGNOSIS — S82141D Displaced bicondylar fracture of right tibia, subsequent encounter for closed fracture with routine healing: Secondary | ICD-10-CM

## 2020-12-14 DIAGNOSIS — S82143D Displaced bicondylar fracture of unspecified tibia, subsequent encounter for closed fracture with routine healing: Secondary | ICD-10-CM

## 2020-12-14 DIAGNOSIS — Z419 Encounter for procedure for purposes other than remedying health state, unspecified: Secondary | ICD-10-CM | POA: Diagnosis not present

## 2020-12-14 DIAGNOSIS — R5381 Other malaise: Secondary | ICD-10-CM | POA: Diagnosis not present

## 2020-12-14 DIAGNOSIS — Z880 Allergy status to penicillin: Secondary | ICD-10-CM

## 2020-12-14 DIAGNOSIS — S92001D Unspecified fracture of right calcaneus, subsequent encounter for fracture with routine healing: Secondary | ICD-10-CM | POA: Diagnosis not present

## 2020-12-14 DIAGNOSIS — G8918 Other acute postprocedural pain: Secondary | ICD-10-CM

## 2020-12-14 DIAGNOSIS — Z87891 Personal history of nicotine dependence: Secondary | ICD-10-CM

## 2020-12-14 DIAGNOSIS — R531 Weakness: Secondary | ICD-10-CM | POA: Diagnosis not present

## 2020-12-14 DIAGNOSIS — G629 Polyneuropathy, unspecified: Secondary | ICD-10-CM | POA: Diagnosis present

## 2020-12-14 DIAGNOSIS — R5383 Other fatigue: Secondary | ICD-10-CM | POA: Diagnosis present

## 2020-12-14 DIAGNOSIS — R339 Retention of urine, unspecified: Secondary | ICD-10-CM | POA: Diagnosis not present

## 2020-12-14 DIAGNOSIS — E876 Hypokalemia: Secondary | ICD-10-CM | POA: Diagnosis not present

## 2020-12-14 DIAGNOSIS — S72302S Unspecified fracture of shaft of left femur, sequela: Secondary | ICD-10-CM | POA: Diagnosis not present

## 2020-12-14 DIAGNOSIS — S82092D Other fracture of left patella, subsequent encounter for closed fracture with routine healing: Secondary | ICD-10-CM

## 2020-12-14 DIAGNOSIS — G7281 Critical illness myopathy: Secondary | ICD-10-CM | POA: Diagnosis present

## 2020-12-14 DIAGNOSIS — H6121 Impacted cerumen, right ear: Secondary | ICD-10-CM | POA: Diagnosis present

## 2020-12-14 DIAGNOSIS — J869 Pyothorax without fistula: Secondary | ICD-10-CM | POA: Diagnosis not present

## 2020-12-14 DIAGNOSIS — J90 Pleural effusion, not elsewhere classified: Secondary | ICD-10-CM | POA: Diagnosis not present

## 2020-12-14 DIAGNOSIS — Z7189 Other specified counseling: Secondary | ICD-10-CM | POA: Diagnosis not present

## 2020-12-14 DIAGNOSIS — N390 Urinary tract infection, site not specified: Secondary | ICD-10-CM | POA: Diagnosis not present

## 2020-12-14 DIAGNOSIS — Z6831 Body mass index (BMI) 31.0-31.9, adult: Secondary | ICD-10-CM

## 2020-12-14 DIAGNOSIS — Z515 Encounter for palliative care: Secondary | ICD-10-CM | POA: Diagnosis not present

## 2020-12-14 DIAGNOSIS — F4320 Adjustment disorder, unspecified: Secondary | ICD-10-CM | POA: Diagnosis present

## 2020-12-14 LAB — BASIC METABOLIC PANEL
Anion gap: 10 (ref 5–15)
BUN: <5 mg/dL — ABNORMAL LOW (ref 6–20)
CO2: 24 mmol/L (ref 22–32)
Calcium: 8.8 mg/dL — ABNORMAL LOW (ref 8.9–10.3)
Chloride: 106 mmol/L (ref 98–111)
Creatinine, Ser: 0.49 mg/dL (ref 0.44–1.00)
GFR, Estimated: >60 mL/min (ref >60)
Glucose, Bld: 90 mg/dL (ref 70–99)
Potassium: 3.7 mmol/L (ref 3.5–5.1)
Sodium: 140 mmol/L (ref 135–145)

## 2020-12-14 LAB — MAGNESIUM: Magnesium: 1.5 mg/dL — ABNORMAL LOW (ref 1.7–2.4)

## 2020-12-14 MED ORDER — MEGESTROL ACETATE 40 MG PO TABS
40.0000 mg | ORAL_TABLET | Freq: Two times a day (BID) | ORAL | Status: DC
  Administered 2020-12-17 – 2021-01-06 (×24): 40 mg via ORAL
  Filled 2020-12-14 (×51): qty 1

## 2020-12-14 MED ORDER — OXYCODONE HCL 5 MG PO TABS
2.5000 mg | ORAL_TABLET | ORAL | Status: DC | PRN
  Filled 2020-12-14: qty 1

## 2020-12-14 MED ORDER — SODIUM CHLORIDE 0.9 % IV SOLN
1.0000 g | Freq: Three times a day (TID) | INTRAVENOUS | Status: DC
  Filled 2020-12-14 (×2): qty 1

## 2020-12-14 MED ORDER — PROMETHAZINE HCL 12.5 MG PO TABS
12.5000 mg | ORAL_TABLET | Freq: Four times a day (QID) | ORAL | Status: DC | PRN
  Administered 2020-12-21: 12.5 mg via ORAL
  Filled 2020-12-14 (×2): qty 1

## 2020-12-14 MED ORDER — ASCORBIC ACID 500 MG PO TABS
500.0000 mg | ORAL_TABLET | Freq: Two times a day (BID) | ORAL | Status: DC
  Administered 2020-12-17 – 2020-12-30 (×9): 500 mg via ORAL
  Filled 2020-12-14 (×70): qty 1

## 2020-12-14 MED ORDER — GUAIFENESIN 100 MG/5ML PO SOLN: 15.0000 mL | Freq: Four times a day (QID) | ORAL | Status: DC | PRN

## 2020-12-14 MED ORDER — VORTIOXETINE HBR 20 MG PO TABS
20.0000 mg | ORAL_TABLET | Freq: Every day | ORAL | Status: DC
  Administered 2020-12-15 – 2021-01-17 (×34): 20 mg via ORAL
  Filled 2020-12-14 (×34): qty 1

## 2020-12-14 MED ORDER — GUAIFENESIN-DM 100-10 MG/5ML PO SYRP: 5.0000 mL | ORAL_SOLUTION | Freq: Four times a day (QID) | ORAL | Status: DC | PRN

## 2020-12-14 MED ORDER — OXYCODONE HCL 5 MG PO TABS
5.0000 mg | ORAL_TABLET | ORAL | Status: DC | PRN
  Administered 2020-12-15 – 2020-12-20 (×7): 10 mg via ORAL
  Administered 2020-12-23 – 2020-12-24 (×2): 5 mg via ORAL
  Administered 2020-12-24 – 2020-12-31 (×23): 10 mg via ORAL
  Administered 2020-12-31: 5 mg via ORAL
  Administered 2021-01-01 – 2021-01-13 (×30): 10 mg via ORAL
  Filled 2020-12-14 (×13): qty 2
  Filled 2020-12-14: qty 1
  Filled 2020-12-14 (×41): qty 2
  Filled 2020-12-14: qty 1
  Filled 2020-12-14 (×9): qty 2

## 2020-12-14 MED ORDER — ZINC OXIDE 12.8 % EX OINT
TOPICAL_OINTMENT | Freq: Three times a day (TID) | CUTANEOUS | Status: DC
  Administered 2021-01-05 – 2021-01-13 (×2): 1 via TOPICAL
  Filled 2020-12-14: qty 56.7

## 2020-12-14 MED ORDER — VITAMIN D 25 MCG (1000 UNIT) PO TABS
4000.0000 [IU] | ORAL_TABLET | Freq: Every day | ORAL | Status: DC
  Administered 2020-12-15 – 2020-12-22 (×2): 4000 [IU] via ORAL
  Filled 2020-12-14 (×25): qty 4

## 2020-12-14 MED ORDER — ZINC SULFATE 220 (50 ZN) MG PO CAPS
220.0000 mg | ORAL_CAPSULE | Freq: Every day | ORAL | Status: DC
  Administered 2020-12-21 – 2020-12-30 (×9): 220 mg via ORAL
  Filled 2020-12-14 (×21): qty 1

## 2020-12-14 MED ORDER — SODIUM CHLORIDE 0.9% FLUSH: 10.0000 mL | INTRAVENOUS | Status: DC | PRN

## 2020-12-14 MED ORDER — ACETAMINOPHEN 325 MG PO TABS
325.0000 mg | ORAL_TABLET | ORAL | Status: DC | PRN
  Administered 2020-12-22 – 2021-01-12 (×9): 650 mg via ORAL
  Filled 2020-12-14 (×9): qty 2

## 2020-12-14 MED ORDER — BISACODYL 10 MG RE SUPP
10.0000 mg | Freq: Every day | RECTAL | Status: DC | PRN
Start: 2020-12-14 — End: 2021-01-17
  Filled 2020-12-14: qty 1

## 2020-12-14 MED ORDER — PROSOURCE PLUS PO LIQD
30.0000 mL | Freq: Two times a day (BID) | ORAL | Status: DC
  Administered 2020-12-15 – 2020-12-22 (×7): 30 mL via ORAL
  Filled 2020-12-14 (×17): qty 30

## 2020-12-14 MED ORDER — POLYETHYLENE GLYCOL 3350 17 G PO PACK: 17.0000 g | PACK | Freq: Every day | ORAL | Status: DC | PRN

## 2020-12-14 MED ORDER — MAGNESIUM OXIDE 400 (241.3 MG) MG PO TABS
400.0000 mg | ORAL_TABLET | Freq: Two times a day (BID) | ORAL | Status: DC
  Administered 2020-12-14 – 2021-01-07 (×39): 400 mg via ORAL
  Filled 2020-12-14 (×53): qty 1

## 2020-12-14 MED ORDER — ENOXAPARIN SODIUM 40 MG/0.4ML ~~LOC~~ SOLN
40.0000 mg | Freq: Two times a day (BID) | SUBCUTANEOUS | Status: DC
  Administered 2020-12-14 – 2021-01-17 (×64): 40 mg via SUBCUTANEOUS
  Filled 2020-12-14 (×67): qty 0.4

## 2020-12-14 MED ORDER — MAGNESIUM SULFATE 2 GM/50ML IV SOLN
2.0000 g | Freq: Once | INTRAVENOUS | Status: AC
  Administered 2020-12-14: 2 g via INTRAVENOUS
  Filled 2020-12-14: qty 50

## 2020-12-14 MED ORDER — PROCHLORPERAZINE 25 MG RE SUPP: 12.5000 mg | Freq: Four times a day (QID) | RECTAL | Status: DC | PRN

## 2020-12-14 MED ORDER — POTASSIUM CHLORIDE CRYS ER 20 MEQ PO TBCR
20.0000 meq | EXTENDED_RELEASE_TABLET | Freq: Every day | ORAL | Status: DC
  Administered 2020-12-17: 20 meq via ORAL
  Filled 2020-12-14 (×4): qty 1

## 2020-12-14 MED ORDER — TRAZODONE HCL 50 MG PO TABS: 25.0000 mg | ORAL_TABLET | Freq: Every evening | ORAL | Status: DC | PRN

## 2020-12-14 MED ORDER — PROCHLORPERAZINE MALEATE 5 MG PO TABS: 5.0000 mg | ORAL_TABLET | Freq: Four times a day (QID) | ORAL | Status: DC | PRN

## 2020-12-14 MED ORDER — ALUM & MAG HYDROXIDE-SIMETH 200-200-20 MG/5ML PO SUSP: 30.0000 mL | ORAL | Status: DC | PRN

## 2020-12-14 MED ORDER — SODIUM CHLORIDE 0.9 % IV SOLN
1.0000 g | Freq: Three times a day (TID) | INTRAVENOUS | Status: AC
  Administered 2020-12-14 – 2020-12-18 (×12): 1 g via INTRAVENOUS
  Filled 2020-12-14 (×13): qty 1

## 2020-12-14 MED ORDER — ONDANSETRON HCL 4 MG PO TABS
4.0000 mg | ORAL_TABLET | Freq: Four times a day (QID) | ORAL | Status: DC | PRN
  Administered 2020-12-15 – 2020-12-21 (×4): 4 mg via ORAL
  Filled 2020-12-14 (×5): qty 1

## 2020-12-14 MED ORDER — DIPHENHYDRAMINE HCL 12.5 MG/5ML PO ELIX
12.5000 mg | ORAL_SOLUTION | Freq: Four times a day (QID) | ORAL | Status: DC | PRN
  Administered 2020-12-21: 25 mg via ORAL
  Filled 2020-12-14: qty 10

## 2020-12-14 MED ORDER — FLEET ENEMA 7-19 GM/118ML RE ENEM: 1.0000 | ENEMA | Freq: Once | RECTAL | Status: DC | PRN

## 2020-12-14 MED ORDER — METHOCARBAMOL 500 MG PO TABS
500.0000 mg | ORAL_TABLET | Freq: Three times a day (TID) | ORAL | Status: DC | PRN
  Administered 2021-01-05 – 2021-01-12 (×8): 500 mg via ORAL
  Filled 2020-12-14 (×9): qty 1

## 2020-12-14 MED ORDER — LACTATED RINGERS IV SOLN: INTRAVENOUS | Status: DC

## 2020-12-14 MED ORDER — ENSURE ENLIVE PO LIQD: 237.0000 mL | Freq: Two times a day (BID) | ORAL | Status: DC

## 2020-12-14 MED ORDER — BOOST / RESOURCE BREEZE PO LIQD CUSTOM: 1.0000 | Freq: Four times a day (QID) | ORAL | Status: DC

## 2020-12-14 MED ORDER — DICLOFENAC SODIUM 1 % EX GEL
2.0000 g | Freq: Two times a day (BID) | CUTANEOUS | Status: DC
  Administered 2020-12-14 – 2021-01-14 (×30): 2 g via TOPICAL
  Filled 2020-12-14: qty 100

## 2020-12-14 MED ORDER — IPRATROPIUM-ALBUTEROL 0.5-2.5 (3) MG/3ML IN SOLN: 3.0000 mL | Freq: Four times a day (QID) | RESPIRATORY_TRACT | Status: DC | PRN

## 2020-12-14 MED ORDER — ENOXAPARIN SODIUM 30 MG/0.3ML ~~LOC~~ SOLN: 30.0000 mg | Freq: Two times a day (BID) | SUBCUTANEOUS | Status: DC

## 2020-12-14 MED ORDER — CETAPHIL MOISTURIZING EX LOTN
TOPICAL_LOTION | CUTANEOUS | Status: DC | PRN
  Filled 2020-12-14: qty 473

## 2020-12-14 MED ORDER — PANTOPRAZOLE SODIUM 40 MG PO TBEC
40.0000 mg | DELAYED_RELEASE_TABLET | Freq: Every day | ORAL | Status: DC
  Administered 2020-12-17 – 2021-01-17 (×30): 40 mg via ORAL
  Filled 2020-12-14 (×35): qty 1

## 2020-12-14 MED ORDER — ACETAMINOPHEN 325 MG PO TABS: 650.0000 mg | ORAL_TABLET | Freq: Four times a day (QID) | ORAL | Status: DC | PRN

## 2020-12-14 MED ORDER — PROCHLORPERAZINE EDISYLATE 10 MG/2ML IJ SOLN
5.0000 mg | Freq: Four times a day (QID) | INTRAMUSCULAR | Status: DC | PRN
  Administered 2020-12-21: 10 mg via INTRAMUSCULAR
  Filled 2020-12-14: qty 2

## 2020-12-14 NOTE — Progress Notes (Signed)
Inpatient Rehabilitation Admissions Coordinator                                         I have insurance approval and Cir bed to admit patient to rehab today. I met with patient at bedside, called her daughter, notified acute team and TOC. I will make the arrangements to admit today.  Danne Baxter, RN, MSN Rehab Admissions Coordinator [phone] 12/14/2020 1:09 PM
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
## 2020-12-14 NOTE — Progress Notes (Signed)
This chaplain responded to PMT consult for spiritual care.  The Pt. Is sitting up in bed, eating Rice Krispies. The Pt. identifies the hope the cereal will help with the nausea she is experiencing.  The Pt. is open to communicating her goal of completing CIR and returning home with the chaplain. The chaplain listened as the Pt. walked the chaplain through her story on the wall of pictures and cards.    The Pt. accepted F/U spiritual care as the holiday season approaches.  The Pt. is exploring the changes in her holiday traditions.
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
## 2020-12-14 NOTE — TOC Transition Note (Signed)
Transition of Care [facility]) - CM/SW Discharge Note   Patient Details  Name: Sara [name] MRN: [id number] Date of Birth: [birth year]  [facility]) CM/SW Contact:  Glennon Mac, RN Phone Number: 12/14/2020, 3:19 PM   Clinical Narrative:   Pt medically stable for discharge; insurance authorization received today for admission to [facility] IP Rehab, and bed available.      Final next level of care: IP Rehab Facility Barriers to Discharge: Barriers Resolved   Patient Goals and CMS Choice Patient states their goals for this hospitalization and ongoing recovery are:: to get back home   Choice offered to / list presented to : Patient    Discharge Planning Services: CM Consult Post Acute Care Choice: IP Rehab (Pt chooses [facility])                               Social Determinants of Health (SDOH) Interventions     Readmission Risk Interventions No flowsheet data found.  Quintella Baton, RN, BSN  Trauma/Neuro ICU Case Manager [phone]
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
## 2020-12-14 NOTE — Progress Notes (Signed)
Inpatient Rehabilitation Medication Review by a Pharmacist  A complete drug regimen review was completed for this patient to identify any potential clinically significant medication issues.  Clinically significant medication issues were identified:  yes   Type of Medication Issue Identified Description of Issue Urgent (address now) Non-Urgent (address on AM team rounds) Plan Plan Accepted by Provider? (Yes / No / Pending AM Rounds)        Incorrect Dose  Megace 40 mg tablets PO BID begun 12/6 for appetite, but usual dose is 400-800 mg daily of suspension. Non-urgent Per P. Love, PA-C, to continue same Megace dose for now, to try to avoid worsening abdominal issues.   Additional Drug Therapy Needed       Other  DC summary mentions continuing Adderall, but discontinued on 12/6 due to patient refusing.  FYI      For non-urgent medication issues to be resolved on team rounds tomorrow morning a [facility] Secure Chat Handoff was sent to: Delle Reining, PA-C  Pharmacist comments:    PA aware that patient is not taking many scheduled meds, including vitamins and supplements.  Has been takingTrintellix and Lovenox, plus prn Oxycodone and Zofran.   Time spent performing this drug regimen review (minutes):  20   Sara [name], [place] 12/14/2020 8:35 PM
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
## 2020-12-14 NOTE — Progress Notes (Signed)
Pt discharged to CIR. PIVs left intact, all personal belongings packed, and pt escorted to 4W23 in bed by RN and 2 NTs.   Robina Ade, RN

## 2020-12-15 ENCOUNTER — Inpatient Hospital Stay (HOSPITAL_COMMUNITY): Payer: Medicaid Other | Admitting: Physical Therapy

## 2020-12-15 ENCOUNTER — Inpatient Hospital Stay (HOSPITAL_COMMUNITY): Payer: Medicaid Other

## 2020-12-15 ENCOUNTER — Inpatient Hospital Stay (HOSPITAL_COMMUNITY): Payer: Self-pay | Admitting: Occupational Therapy

## 2020-12-15 DIAGNOSIS — J869 Pyothorax without fistula: Secondary | ICD-10-CM | POA: Diagnosis not present

## 2020-12-15 DIAGNOSIS — S72302S Unspecified fracture of shaft of left femur, sequela: Secondary | ICD-10-CM | POA: Diagnosis not present

## 2020-12-15 DIAGNOSIS — S82141S Displaced bicondylar fracture of right tibia, sequela: Secondary | ICD-10-CM

## 2020-12-15 DIAGNOSIS — E440 Moderate protein-calorie malnutrition: Secondary | ICD-10-CM | POA: Insufficient documentation

## 2020-12-15 DIAGNOSIS — G7281 Critical illness myopathy: Secondary | ICD-10-CM | POA: Diagnosis not present

## 2020-12-15 LAB — COMPREHENSIVE METABOLIC PANEL
ALT: 25 U/L (ref 0–44)
AST: 40 U/L (ref 15–41)
Albumin: 2 g/dL — ABNORMAL LOW (ref 3.5–5.0)
Alkaline Phosphatase: 97 U/L (ref 38–126)
Anion gap: 13 (ref 5–15)
BUN: <5 mg/dL — ABNORMAL LOW (ref 6–20)
CO2: 23 mmol/L (ref 22–32)
Calcium: 9 mg/dL (ref 8.9–10.3)
Chloride: 103 mmol/L (ref 98–111)
Creatinine, Ser: 0.47 mg/dL (ref 0.44–1.00)
GFR, Estimated: >60 mL/min (ref >60)
Glucose, Bld: 78 mg/dL (ref 70–99)
Potassium: 4 mmol/L (ref 3.5–5.1)
Sodium: 139 mmol/L (ref 135–145)
Total Bilirubin: 0.6 mg/dL (ref 0.3–1.2)
Total Protein: 6 g/dL — ABNORMAL LOW (ref 6.5–8.1)

## 2020-12-15 LAB — CBC WITH DIFFERENTIAL/PLATELET
Abs Immature Granulocytes: 0.04 10*3/uL (ref 0.00–0.07)
Basophils Absolute: 0.1 10*3/uL (ref 0.0–0.1)
Basophils Relative: 1 %
Eosinophils Absolute: 0.2 10*3/uL (ref 0.0–0.5)
Eosinophils Relative: 4 %
HCT: 33.6 % — ABNORMAL LOW (ref 36.0–46.0)
Hemoglobin: 10.4 g/dL — ABNORMAL LOW (ref 12.0–15.0)
Immature Granulocytes: 1 %
Lymphocytes Relative: 40 %
Lymphs Abs: 2.5 10*3/uL (ref 0.7–4.0)
MCH: 30.2 pg (ref 26.0–34.0)
MCHC: 31 g/dL (ref 30.0–36.0)
MCV: 97.7 fL (ref 80.0–100.0)
Monocytes Absolute: 0.7 10*3/uL (ref 0.1–1.0)
Monocytes Relative: 11 %
Neutro Abs: 2.8 10*3/uL (ref 1.7–7.7)
Neutrophils Relative %: 43 %
Platelets: 463 10*3/uL — ABNORMAL HIGH (ref 150–400)
RBC: 3.44 MIL/uL — ABNORMAL LOW (ref 3.87–5.11)
RDW: 15 % (ref 11.5–15.5)
WBC: 6.3 10*3/uL (ref 4.0–10.5)
nRBC: 0 % (ref 0.0–0.2)

## 2020-12-15 LAB — MAGNESIUM: Magnesium: 1.9 mg/dL (ref 1.7–2.4)

## 2020-12-15 MED ORDER — ENSURE ENLIVE PO LIQD: 237.0000 mL | Freq: Three times a day (TID) | ORAL | Status: DC | PRN

## 2020-12-15 MED ORDER — BOOST / RESOURCE BREEZE PO LIQD CUSTOM: 1.0000 | Freq: Three times a day (TID) | ORAL | Status: DC | PRN

## 2020-12-15 NOTE — Progress Notes (Addendum)
[facility]patient [facility] Individual Statement of Services  Patient Name:  Sara [name]  Date:  12/15/2020  Welcome to the Inpatient Rehabilitation [facility].  Our goal is to provide you with an individualized program based on your diagnosis and situation, designed to meet your specific needs.  With this comprehensive rehabilitation program, you will be expected to participate in at least 3 hours of rehabilitation therapies Monday-Friday, with modified therapy programming on the weekends.  Your rehabilitation program will include the following services:  Physical Therapy (PT), Occupational Therapy (OT), Speech Therapy (ST), 24 hour per day rehabilitation nursing, Therapeutic Recreaction (TR), Neuropsychology, Care Coordinator, Rehabilitation Medicine, Nutrition Services, Pharmacy Services and Other  Weekly team conferences will be held on Tuesday to discuss your progress.  Your Inpatient Rehabilitation Care Coordinator will talk with you frequently to get your input and to update you on team discussions.  Team conferences with you and your family in attendance may also be held.  Expected length of stay: 20-22 Days  Overall anticipated outcome: Supervision to Min A  Depending on your progress and recovery, your program may change. Your Inpatient Rehabilitation Care Coordinator will coordinate services and will keep you informed of any changes. Your Inpatient Rehabilitation Care Coordinator's name and contact numbers are listed  below.  The following services may also be recommended but are not provided by the Inpatient Rehabilitation Center:    Home Health Rehabiltiation Services  Outpatient Rehabilitation Services    Arrangements will be made to provide these services after discharge if needed.  Arrangements include referral to agencies that provide these services.  Your insurance has been verified to be:  [place] Medicaid- Medicaid [facility]  Your primary doctor is:  Orson Eva,  NP  Pertinent information will be shared with your doctor and your insurance company.  Inpatient Rehabilitation Care Coordinator:  Lavera Guise, [place] [phone] or [phone]  Information discussed with and copy given to patient by: Andria Rhein, 12/15/2020, 9:58 AM

## 2020-12-15 NOTE — Progress Notes (Signed)
Physical Therapy Session Note  Patient Details  Name: Sara [name] MRN: [id number] Date of Birth: 16-Jul-1966  Today's Date: 12/15/2020 PT Individual Time: 1133-1205 PT Individual Time Calculation (min): 32 min   Short Term Goals: Week 1:     Skilled Therapeutic Interventions/Progress Updates:    Patient up in tilt in space w/c.  Reports feeling nauseated, chest heaviness and needs to return to bed.  BP taken and WNL.  Tilt in space w/c positioned to transfer to R via slide board.  Patient vomited after chair turned for transfer.  RN aware.  Positioned board with max A.  Patient slide board to bed initially mod A with A of second person for L LE support; then increased assist to max and pt with LOB posterior onto bed needing total A to return to upright.  Patient needing +2 A for completing transfer with RN holding L leg up.  Patient sit to supine with +2 A.  Positioned up in bed with +2 A.  Patient left in tilt bed with needs in reach.  RN aware pt with nausea and vomiting needing medication.  Therapy Documentation Precautions:  Precautions Precautions: Fall Precaution Comments: Unrestricted ROM B knees, L ankle, B hips; PROM/AROM R hand, wrist, knee, ankle, L knee and ankle Required Braces or Orthoses: Splint/Cast Splint/Cast: RUE wrist splint on when mobilizing Restrictions Weight Bearing Restrictions: Yes RUE Weight Bearing: Weight bearing as tolerated LUE Weight Bearing: Weight bearing as tolerated RLE Weight Bearing: Weight bearing as tolerated LLE Weight Bearing: Partial weight bearing LLE Partial Weight Bearing Percentage or Pounds: 50 Pain: Pain Assessment Pain Scale: 0-10 Pain Score: 7  Pain Type: Acute pain;Surgical pain Pain Location: Knee Pain Orientation: Left Pain Descriptors / Indicators: Throbbing Pain Intervention(s): Repositioned       Therapy/Group: Individual Therapy  [name]  [name], PT 12/15/2020, 4:34 PM

## 2020-12-15 NOTE — Progress Notes (Signed)
Physical Therapy Session Note  Patient Details  Name: Sara [name] MRN: [id number] Date of Birth: 1966-04-08  Today's Date: 12/15/2020 PT Individual Time: 8756-4332 PT Individual Time Calculation (min): 38 min   Short Term Goals: Week 1:  PT Short Term Goal 1 (Week 1): Pt will perform supine<>sit with max assist of 1 PT Short Term Goal 2 (Week 1): Pt will perform bed<>chair transfers using LRAD with max assist of 1 and +2 min assist PT Short Term Goal 3 (Week 1): Pt will perform sit<>stands using equipment as needed with +2 max assist PT Short Term Goal 4 (Week 1): Pt will tolerate sitting upright, OOB in TIS w/c for at least 3 total hours a day  Skilled Therapeutic Interventions/Progress Updates:    Pt received supine in bed and agreeable to bed level therapy only stating she is still not feeling well and declines progression to EOB despite encouragement. Therapy session focused on B LE ROM and pt education regarding CPM machine to promote increased L LE knee flexion ROM initially and then potentially using it bilaterally if tolerated well and showing improvement. Therapist also reinforced education on importance of wearing B LE PRAFOs with pt stating she wore them for a few hours earlier today but states she cannot sleep in them - educated her that she will need to increase time spent wearing them during the day if she is supine in the bed since not tolerating them at night - pt verbalizes understanding but will require reinforced education.  Performed the following B LE movements: - ankle DF stretch with knee extended 2x1 minute each - knee extension/flexion AAROM/PROM with slight overpressure into flexion to pt tolerance 2x10 reps each - hip flexion/extension AAROM/PROM with knees extended due to lack of knee flexion ROM 2x10reps each - hip abduction/adduction AAROM 2x10 reps each Pt has most significant pain in L knee during terminal extension and during overpressure into flexion.  Continues to have swelling/edema in L thigh/knee with pt stating sometimes it feels "so tight, it could burst open." Educated pt on balance between sitting up in chair to promote increased upright tolerance with improved cardiopulmonary endurance and prolonged passive B LE knee flexor and ankle DF stretch versus having L LE positioned in dependent position with gravity causing increased edema. Educated pt on importance of moving L LE throughout day to help with edema management. Pt left supine in bed with B LE PRAFOs donned and needs in reach.  Therapy Documentation Precautions:  Precautions Precautions: Fall Precaution Comments: Unrestricted ROM B knees, L ankle, B hips; PROM/AROM R hand, wrist, knee, ankle, L knee and ankle Required Braces or Orthoses: Splint/Cast Splint/Cast: RUE wrist splint on when mobilizing Restrictions Weight Bearing Restrictions: Yes RUE Weight Bearing: Weight bearing as tolerated LUE Weight Bearing: Weight bearing as tolerated RLE Weight Bearing: Weight bearing as tolerated LLE Weight Bearing: Partial weight bearing LLE Partial Weight Bearing Percentage or Pounds: 50  Pain: Reports most significant pain in L knee during flexion/extension ROM - limited repetitions and moved slow for pain management.   Therapy/Group: Individual Therapy  Ginny Forth , PT, DPT, CSRS  12/15/2020, 5:47 PM
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
## 2020-12-15 NOTE — Progress Notes (Signed)
This chaplain is present bedside for Pt. F/U spiritual care. The Pt. is nibbling on a sandwich and shares she is still struggling with the nausea.  The Pt. remains hopeful the rehab will build strength in her legs and lead to her returning home.  The chaplain explored the Pt. holiday traditions and thoughts of an activity that may keep her connected to family. The chaplain listened to  a creative side of the Pt. as the Pt. described her plans for the holiday season. The chaplain understands the Pt. is waiting on her sister to re-position the cards and pictures in the Pt. Room.    PT enters the room and the Pt. remains open to F/U spiritual care.
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
## 2020-12-15 NOTE — Consult Note (Signed)
WOC Nurse wound follow up Patient now in 4W23. Wound type: healing abdominal surgical wound Measurement:7 cm x 7 cm x 0.5 cm. Right lower wound tunnel now measures 1 cm in length; left lower wound tunnel now measures 0.4 cm Wound bed: 100% pink granulation tissue, no visible sutures Drainage (amount, consistency, odor) serous in VAC cannister Periwound: intact Dressing procedure/placement/frequency: Performed and ordered today: Place Xeroform gauze Hart [place] [phone]) into the abdominal wound bed. Cut a strip of Xeroform and tuck into the tunnel on the bottom Right and Left sides of the wound. Cover with a foam dressing. Change daily.  Patient reports the ostomy appliance I placed 12/15 had to be replaced several times over the intervening hours.  I am not sure what could have caused the leakage. Generally, her pouching system lasts 5 - 7 days.  Today, she has on a newly placed convex one piece system, Hart [place] [phone].  She has a few ostomy supplies in the room, and I have asked her primary RN to request additional supplies.  A member of the WOC team will see her next week on Tuesday.  Monitor the wound area(s) for worsening of condition such as: Signs/symptoms of infection,  Increase in size,  Development of or worsening of odor, Development of pain, or increased pain at the affected locations.  Notify the medical team if any of these develop. Discussed plan of care with the patient and bedside nurse.   Helmut Muster, RN, MSN, CWOCN, CNS-BC, pager [phone]

## 2020-12-15 NOTE — Progress Notes (Signed)
Patient ID: Sara [name], female   DOB: [birth year], 54 y.o.   MRN: [id number]   SW received phone call from pt sister Alvino Chapel). Daughter Hilda Lias) currently has COVID and is sick. Provided sister with same information (refer to admission note)
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
## 2020-12-15 NOTE — Progress Notes (Signed)
Patient information reviewed and entered into [facility] System by Becky Freddy Kinne, PPS coordinator. Information including medical coding, function ability, and quality indicators will be reviewed and updated through discharge.   
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
## 2020-12-16 ENCOUNTER — Inpatient Hospital Stay (HOSPITAL_COMMUNITY): Payer: Medicaid Other

## 2020-12-16 ENCOUNTER — Inpatient Hospital Stay (HOSPITAL_COMMUNITY): Payer: Medicaid Other | Admitting: Occupational Therapy

## 2020-12-16 DIAGNOSIS — G7281 Critical illness myopathy: Secondary | ICD-10-CM | POA: Diagnosis not present

## 2020-12-16 MED ORDER — PROMETHAZINE HCL 12.5 MG PO TABS
12.5000 mg | ORAL_TABLET | Freq: Three times a day (TID) | ORAL | Status: DC
  Administered 2020-12-16 – 2021-01-01 (×63): 12.5 mg via ORAL
  Filled 2020-12-16 (×76): qty 1
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
## 2020-12-16 NOTE — Plan of Care (Signed)
  Problem: Consults Goal: RH GENERAL PATIENT EDUCATION Description: See Patient Education module for education specifics. Outcome: Progressing Goal: Skin Care Protocol Initiated - if Braden Score 18 or less Description: If consults are not indicated, leave blank or document N/A Outcome: Progressing Goal: Nutrition Consult-if indicated Outcome: Progressing   Problem: RH BOWEL ELIMINATION Goal: RH STG MANAGE BOWEL WITH ASSISTANCE Description: STG Manage Bowel with min Assistance. Outcome: Progressing   Problem: RH SKIN INTEGRITY Goal: RH STG SKIN FREE OF INFECTION/BREAKDOWN Description: Skin to remain free from breakdown and infection with min assist while on rehab. Outcome: Progressing Goal: RH STG MAINTAIN SKIN INTEGRITY WITH ASSISTANCE Description: STG Maintain Skin Integrity With min Assistance. Outcome: Progressing Goal: RH STG ABLE TO PERFORM INCISION/WOUND CARE W/ASSISTANCE Description: STG Able To Perform Incision/Wound Care With min Assistance. Outcome: Progressing   Problem: RH SAFETY Goal: RH STG ADHERE TO SAFETY PRECAUTIONS W/ASSISTANCE/DEVICE Description: STG Adhere to Safety Precautions With min Assistance/Device. Outcome: Progressing Goal: RH STG DECREASED RISK OF FALL WITH ASSISTANCE Description: STG Decreased Risk of Fall With min Assistance. Outcome: Progressing   Problem: RH PAIN MANAGEMENT Goal: RH STG PAIN MANAGED AT OR BELOW PT'S PAIN GOAL Description: <4 on a 0-10 pain scale Outcome: Progressing   Problem: RH KNOWLEDGE DEFICIT GENERAL Goal: RH STG INCREASE KNOWLEDGE OF [place] CARE AFTER HOSPITALIZATION Description: Patient will demonstrate knowledge of medication management, nutritional supplement, ostomy management, weight bearing precautions with educational materials and handouts with min assist from staff. Outcome: Progressing

## 2020-12-16 NOTE — Progress Notes (Signed)
Physical Therapy Session Note  Patient Details  Name: Sara [name] MRN: [id number] Date of Birth: 1966-01-03  Today's Date: 12/16/2020 PT Individual Time:0800-0850; 1300-1400 PT Individual Time Calculation (min): 50 min, 60 min   Short Term Goals: Week 1:  PT Short Term Goal 1 (Week 1): Pt will perform supine<>sit with max assist of 1 PT Short Term Goal 2 (Week 1): Pt will perform bed<>chair transfers using LRAD with max assist of 1 and +2 min assist PT Short Term Goal 3 (Week 1): Pt will perform sit<>stands using equipment as needed with +2 max assist PT Short Term Goal 4 (Week 1): Pt will tolerate sitting upright, OOB in TIS w/c for at least 3 total hours a day  Skilled Therapeutic Interventions/Progress Updates:  tx 1:  Pt dozing in bed.  She reported being tired, and rated pain 7/10 LLE. Pt stated that she had not been using her incentive inspirometer; PT educated her on risk of pnuemonia due to time in bed.  Pt voiced understanding, agreed to use her R hand to hold inspirometer,  x 10 reps.  Supine Therapeutic exercises performed with LEs to increase strength for functional mobility: 10 x 1 R straight leg raises, R hip abduction/adduction, R knee flexion (off of EOB)/extension, ankle pumps.  PROM R knee and ankle x 1 minute each. Active assistive 10 x 1 L straight leg raises, AA L knee flexion/extension with slight stretch by PT into flexion, ankle pumps with slight stretch into ankle DF by PT.  Educated pt about PRAFOs and avoidance of contractures.  Pt agreed to having PRAFOS donned.  At end of session, pt in bed with alarm set and needs at hand.  tx 2:  Pt sitting up in tilt in space w/c, fatigued and in pain (7/10 L knee).  Slide board transfer +2 to return to bed, with pt assisting with R hand on bed rail.  Use of footstool for safety and to support bil LEs.  In sitting EOB with footstool under bil feet: 20 x 1 bil heel raises, toe raises, bil scapular adduction;  10 x 1 bil  hip adduction squeezes to activate core; 15 x 1 R long arc quad knee extension, active assistive L long arc quad knee extension.    Sit> supine in flat bed with mod/max assist.  Pillow placed under LLL for pain relief; heel without contact on bed or pillow.  Using red therapy ball under bil LLs, R/L knee/hip ROM in supine with assistance from PT.  At end of session, pt resting in bed with needs at hand and bed alarm set. Pt declined having PRAFOs donned.  PT advised her to perform L ankle pumps often, and she agreed.     Therapy Documentation Precautions:  Precautions Precautions: Fall,Other (comment) Precaution Comments: Unrestricted ROM B knees, L ankle, B hips; PROM/AROM R wrist; colostomy; abdominal wound Required Braces or Orthoses: Splint/Cast Splint/Cast: RUE wrist splint on when mobilizing Restrictions Weight Bearing Restrictions: Yes RUE Weight Bearing: Weight bearing as tolerated LUE Weight Bearing: Weight bearing as tolerated RLE Weight Bearing: Weight bearing as tolerated LLE Weight Bearing: Partial weight bearing LLE Partial Weight Bearing Percentage or Pounds: 50%         Therapy/Group: Individual Therapy  Zainah Steven 12/16/2020, 4:17 PM

## 2020-12-16 NOTE — Progress Notes (Signed)
Orthopedic Tech Progress Note Patient Details:  Sara [name] 06/15/1966 [id number]  CPM Left Knee CPM Left Knee: On Left Knee Flexion (Degrees): 0 Left Knee Extension (Degrees): 15  Post Interventions Patient Tolerated: Well Instructions Provided: Adjustment of device,Care of device  [name] 12/16/2020, 8:21 PM

## 2020-12-16 NOTE — Progress Notes (Signed)
Occupational Therapy Session Note  Patient Details  Name: Sara [name] MRN: [id number] Date of Birth: [birth year]  Today's Date: 12/16/2020  Session 1 OT Individual Time: 7035-0093 OT Individual Time Calculation (min): 54 min   Session 2 OT Missed Time: 34 Minutes Missed Time Reason: Patient fatigue;Pain   Short Term Goals: Week 1:  OT Short Term Goal 1 (Week 1): Pt will complete UB dressing with min assist OT Short Term Goal 2 (Week 1): pt will complete bathing with mod assist of one caregiver OT Short Term Goal 3 (Week 1): Pt will complete LB dressing mod assist of one caregiver OT Short Term Goal 4 (Week 1): Pt will complete toilet transfer with mod assist of one caregiver  Skilled Therapeutic Interventions/Progress Updates:    Session 1 Pt greeted semi-reclined in bed and agreeable to OT treatment session. Pt reported L leg pain at 7/10, but stated she could still participate. Pt declined any bathing/dressing today, but agreeable to sit EOB. Max A to advance LEs off of bed, then mod A to elevate trunk. Bed deflated and LEs supported on step stool. Pt tolerated sitting at EOB for 25 minutes while OT braided hair, and performed LE there-ex. Pt then agreeable to get to wc. Nurse tech entered to provide +2 assistance. Max/total +2 downhill slideboard transfer to TIS wc. Pt needed rest break after transfer. LEs elevated with ELRs. OT propelled pt to therapy gym to show her where future therapies will take place. Pt returned to room and agreeable to stay up in TIS wc with alarm belt on, call bell in reach, and needs met.   Session 2 Pt greeted semi-reclined in bed with blanket covering her head. Pt reports she just returned to bed with PT. Pt declined bed level there-ex 2/2 fatigue and nausea. Will follow up per plan of care.  Therapy Documentation Precautions:  Precautions Precautions: Fall,Other (comment) Precaution Comments: Unrestricted ROM B knees, L ankle, B hips; PROM/AROM R  wrist; colostomy; abdominal wound Required Braces or Orthoses: Splint/Cast Splint/Cast: RUE wrist splint on when mobilizing Restrictions Weight Bearing Restrictions: Yes RUE Weight Bearing: Weight bearing as tolerated LUE Weight Bearing: Weight bearing as tolerated RLE Weight Bearing: Weight bearing as tolerated LLE Weight Bearing: Partial weight bearing LLE Partial Weight Bearing Percentage or Pounds: 50% General: General OT Amount of Missed Time: 34 Minutes Vital Signs: Pain:  7/10 pain in L LE, rest and repositioned for comfort  Therapy/Group: Individual Therapy  Valma Cava 12/16/2020, 2:15 PM

## 2020-12-17 DIAGNOSIS — G7281 Critical illness myopathy: Secondary | ICD-10-CM | POA: Diagnosis not present

## 2020-12-17 NOTE — Plan of Care (Signed)
  Problem: Consults Goal: RH GENERAL PATIENT EDUCATION Description: See Patient Education module for education specifics. 12/17/2020 1643 by Mauro Kaufmann L, LPN Outcome: Progressing 12/17/2020 0829 by Cletis Media, LPN Outcome: Progressing Goal: Skin Care Protocol Initiated - if Braden Score 18 or less Description: If consults are not indicated, leave blank or document N/A 12/17/2020 1643 by Mauro Kaufmann L, LPN Outcome: Progressing 12/17/2020 0829 by Mauro Kaufmann L, LPN Outcome: Progressing Goal: Nutrition Consult-if indicated 12/17/2020 1643 by Mauro Kaufmann L, LPN Outcome: Progressing 12/17/2020 0829 by Mauro Kaufmann L, LPN Outcome: Progressing   Problem: RH BOWEL ELIMINATION Goal: RH STG MANAGE BOWEL WITH ASSISTANCE Description: STG Manage Bowel with min Assistance. 12/17/2020 1643 by Mauro Kaufmann L, LPN Outcome: Progressing 12/17/2020 0829 by Cletis Media, LPN Outcome: Progressing   Problem: RH SKIN INTEGRITY Goal: RH STG SKIN FREE OF INFECTION/BREAKDOWN Description: Skin to remain free from breakdown and infection with min assist while on rehab. 12/17/2020 1643 by Mauro Kaufmann L, LPN Outcome: Progressing 12/17/2020 0829 by Cletis Media, LPN Outcome: Progressing Goal: RH STG MAINTAIN SKIN INTEGRITY WITH ASSISTANCE Description: STG Maintain Skin Integrity With min Assistance. 12/17/2020 1643 by Mauro Kaufmann L, LPN Outcome: Progressing 12/17/2020 0829 by Cletis Media, LPN Outcome: Progressing Goal: RH STG ABLE TO PERFORM INCISION/WOUND CARE W/ASSISTANCE Description: STG Able To Perform Incision/Wound Care With min Assistance. 12/17/2020 1643 by Mauro Kaufmann L, LPN Outcome: Progressing 12/17/2020 0829 by Cletis Media, LPN Outcome: Progressing   Problem: RH SAFETY Goal: RH STG ADHERE TO SAFETY PRECAUTIONS W/ASSISTANCE/DEVICE Description: STG Adhere to Safety Precautions With min Assistance/Device. 12/17/2020 1643 by Mauro Kaufmann L, LPN Outcome:  Progressing 12/17/2020 0829 by Cletis Media, LPN Outcome: Progressing Goal: RH STG DECREASED RISK OF FALL WITH ASSISTANCE Description: STG Decreased Risk of Fall With min Assistance. 12/17/2020 1643 by Mauro Kaufmann L, LPN Outcome: Progressing 12/17/2020 0829 by Mauro Kaufmann L, LPN Outcome: Progressing   Problem: RH PAIN MANAGEMENT Goal: RH STG PAIN MANAGED AT OR BELOW PT'S PAIN GOAL Description: <4 on a 0-10 pain scale 12/17/2020 1643 by Mauro Kaufmann L, LPN Outcome: Progressing 12/17/2020 0829 by Cletis Media, LPN Outcome: Progressing   Problem: RH KNOWLEDGE DEFICIT GENERAL Goal: RH STG INCREASE KNOWLEDGE OF [place] CARE AFTER HOSPITALIZATION Description: Patient will demonstrate knowledge of medication management, nutritional supplement, ostomy management, weight bearing precautions with educational materials and handouts with min assist from staff. 12/17/2020 1643 by Cletis Media, LPN Outcome: Progressing 12/17/2020 0829 by Cletis Media, LPN Outcome: Progressing
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
## 2020-12-17 NOTE — Plan of Care (Signed)
°  Problem: Consults Goal: RH GENERAL PATIENT EDUCATION Description: See Patient Education module for education specifics. 12/17/2020 0829 by Cletis Media, LPN Outcome: Progressing 12/16/2020 1835 by Cletis Media, LPN Outcome: Progressing Goal: Skin Care Protocol Initiated - if Braden Score 18 or less Description: If consults are not indicated, leave blank or document N/A 12/17/2020 0829 by Mauro Kaufmann L, LPN Outcome: Progressing 12/16/2020 1835 by Mauro Kaufmann L, LPN Outcome: Progressing Goal: Nutrition Consult-if indicated 12/17/2020 0829 by Mauro Kaufmann L, LPN Outcome: Progressing 12/16/2020 1835 by Mauro Kaufmann L, LPN Outcome: Progressing   Problem: RH BOWEL ELIMINATION Goal: RH STG MANAGE BOWEL WITH ASSISTANCE Description: STG Manage Bowel with min Assistance. 12/17/2020 0829 by Cletis Media, LPN Outcome: Progressing 12/16/2020 1835 by Cletis Media, LPN Outcome: Progressing   Problem: RH SKIN INTEGRITY Goal: RH STG SKIN FREE OF INFECTION/BREAKDOWN Description: Skin to remain free from breakdown and infection with min assist while on rehab. 12/17/2020 0829 by Mauro Kaufmann L, LPN Outcome: Progressing 12/16/2020 1835 by Cletis Media, LPN Outcome: Progressing Goal: RH STG MAINTAIN SKIN INTEGRITY WITH ASSISTANCE Description: STG Maintain Skin Integrity With min Assistance. 12/17/2020 0829 by Cletis Media, LPN Outcome: Progressing 12/16/2020 1835 by Cletis Media, LPN Outcome: Progressing Goal: RH STG ABLE TO PERFORM INCISION/WOUND CARE W/ASSISTANCE Description: STG Able To Perform Incision/Wound Care With min Assistance. 12/17/2020 0829 by Cletis Media, LPN Outcome: Progressing 12/16/2020 1835 by Cletis Media, LPN Outcome: Progressing   Problem: RH SAFETY Goal: RH STG ADHERE TO SAFETY PRECAUTIONS W/ASSISTANCE/DEVICE Description: STG Adhere to Safety Precautions With min Assistance/Device. 12/17/2020 0829 by Cletis Media, LPN Outcome:  Progressing 12/16/2020 1835 by Cletis Media, LPN Outcome: Progressing Goal: RH STG DECREASED RISK OF FALL WITH ASSISTANCE Description: STG Decreased Risk of Fall With min Assistance. 12/17/2020 0829 by Cletis Media, LPN Outcome: Progressing 12/16/2020 1835 by Cletis Media, LPN Outcome: Progressing   Problem: RH PAIN MANAGEMENT Goal: RH STG PAIN MANAGED AT OR BELOW PT'S PAIN GOAL Description: <4 on a 0-10 pain scale 12/17/2020 0829 by Mauro Kaufmann L, LPN Outcome: Progressing 12/16/2020 1835 by Cletis Media, LPN Outcome: Progressing   Problem: RH KNOWLEDGE DEFICIT GENERAL Goal: RH STG INCREASE KNOWLEDGE OF [place] CARE AFTER HOSPITALIZATION Description: Patient will demonstrate knowledge of medication management, nutritional supplement, ostomy management, weight bearing precautions with educational materials and handouts with min assist from staff. 12/17/2020 0829 by Cletis Media, LPN Outcome: Progressing 12/16/2020 1835 by Cletis Media, LPN Outcome: Progressing
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
## 2020-12-18 ENCOUNTER — Inpatient Hospital Stay (HOSPITAL_COMMUNITY): Payer: Medicaid Other

## 2020-12-18 DIAGNOSIS — E440 Moderate protein-calorie malnutrition: Secondary | ICD-10-CM

## 2020-12-18 DIAGNOSIS — R531 Weakness: Secondary | ICD-10-CM

## 2020-12-18 DIAGNOSIS — R5381 Other malaise: Secondary | ICD-10-CM

## 2020-12-18 DIAGNOSIS — S72302S Unspecified fracture of shaft of left femur, sequela: Secondary | ICD-10-CM | POA: Diagnosis not present

## 2020-12-18 DIAGNOSIS — S82141S Displaced bicondylar fracture of right tibia, sequela: Secondary | ICD-10-CM | POA: Diagnosis not present

## 2020-12-18 DIAGNOSIS — G7281 Critical illness myopathy: Secondary | ICD-10-CM | POA: Diagnosis not present

## 2020-12-18 DIAGNOSIS — J869 Pyothorax without fistula: Secondary | ICD-10-CM | POA: Diagnosis not present

## 2020-12-18 LAB — BASIC METABOLIC PANEL
Anion gap: 11 (ref 5–15)
BUN: <5 mg/dL — ABNORMAL LOW (ref 6–20)
CO2: 23 mmol/L (ref 22–32)
Calcium: 8.7 mg/dL — ABNORMAL LOW (ref 8.9–10.3)
Chloride: 106 mmol/L (ref 98–111)
Creatinine, Ser: 0.4 mg/dL — ABNORMAL LOW (ref 0.44–1.00)
GFR, Estimated: >60 mL/min (ref >60)
Glucose, Bld: 88 mg/dL (ref 70–99)
Potassium: 3 mmol/L — ABNORMAL LOW (ref 3.5–5.1)
Sodium: 140 mmol/L (ref 135–145)

## 2020-12-18 MED ORDER — POTASSIUM CHLORIDE CRYS ER 20 MEQ PO TBCR
20.0000 meq | EXTENDED_RELEASE_TABLET | Freq: Two times a day (BID) | ORAL | Status: DC
  Administered 2020-12-18 – 2021-01-17 (×56): 20 meq via ORAL
  Filled 2020-12-18 (×65): qty 1

## 2020-12-18 MED ORDER — SUCRALFATE 1 GM/10ML PO SUSP
1.0000 g | Freq: Three times a day (TID) | ORAL | Status: DC
  Administered 2020-12-18 – 2021-01-17 (×114): 1 g via ORAL
  Filled 2020-12-18 (×123): qty 10
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
## 2020-12-18 NOTE — Progress Notes (Signed)
RN changed ostomy bag twice. It was changed last night due to leaking. It was changed this morning because it had leaked throughout the night. RN will re-assess ostomy bag during shift change with day shift nurse. Pt in no acute distress. RN will continue to monitor.

## 2020-12-18 NOTE — Progress Notes (Signed)
Patient ID: Sara [name], female   DOB: [birth year], 54 y.o.   MRN: [id number]  Patient has been transitioned to inpatient CIR rehabilitation unit.  This NP visited patient at the bedside as a follow up to for palliative medicine needs and emotional support.  She is in bed, sleeping covered with blankets and stuffed animals.  Today is day 3 of her CIR stay  Education offered regarding the opportunity that CIR can offer her.  Ongoing education regarding the importance of  personal responsibility in treatment plan.    Created space and opportunity for patient to explore her thoughts and feelings regarding current medical situation.  I acknowledged how difficult of a  time both physically and psychologically this has been for her.    Sara [name] is not engaged today, emotional support offered.   Questions and concerns addressed   Discussed with bedside RN and LCSW          PMT will continue to support holistically  Total time spent on the unit was 15 minutes  Greater than 50% of the time was spent in counseling and coordination of care  [name] NP  Palliative Medicine Team Team Phone # [phone] Pager [phone]

## 2020-12-18 NOTE — Progress Notes (Signed)
Occupational Therapy Session Note  Patient Details  Name: Sara [name] MRN: [id number] Date of Birth: [birth year]  Today's Date: 12/18/2020 OT Missed Time: 60 Minutes Missed Time Reason: Pain;Patient ill (comment) (n/v)   Attempted to see pt. Pt requires increased time to arouse and reports nausea and vomiting throughout the night. Pt declining services at this time d/t feeling ill. Edu re participating in other sessions since pt is resting this morining. Pt verbalized understanding   Shon Hale 12/18/2020, 7:58 AM

## 2020-12-18 NOTE — Progress Notes (Signed)
Physical Therapy Session Note  Patient Details  Name: Sara [name] MRN: [id number] Date of Birth: July 05, 1966  Today's Date: 12/18/2020 PT Individual Time: 1100-1200 PT Individual Time Calculation (min): 60 min   Short Term Goals: Week 1:  PT Short Term Goal 1 (Week 1): Pt will perform supine<>sit with max assist of 1 PT Short Term Goal 2 (Week 1): Pt will perform bed<>chair transfers using LRAD with max assist of 1 and +2 min assist PT Short Term Goal 3 (Week 1): Pt will perform sit<>stands using equipment as needed with +2 max assist PT Short Term Goal 4 (Week 1): Pt will tolerate sitting upright, OOB in TIS w/c for at least 3 total hours a day Week 2:    Week 3:     Skilled Therapeutic Interventions/Progress Updates:    PAIN pt c/o pain w/LE stretching, performed to tolerance and rest breaks provided as needed.  Addressed positioning in wc.  Pt initially supine, reports N&V this am but agreeable to attempting oob to tolerance.  Pt supine to sit w/verbal cues/encouragment to maximize independence w/task, and mod assist of 1.   Static sit on edge of bed, feet unsupported, cga using single UE on bedrail. Pt wt shifts w/min assist for total assist w/sliding board placment.   wc to/from TIS wc w/max assist of 2, cues for sequencing, cues for ant wt shift and head hips relationship w/task.  Feet supported on tall stepstool.  The following adjustements made to seating/posiitoning to promote improved ROM: Llegrest lengthenien and eggcrate added to footplate.  Pt foot then able to rest on footplate primarily w/wbing thru forefoot but noted improves over time w/positioning.  legrest gradually progressed to lowest position to promote knee flexion rom. Eggcrate to R footplate to improve tolerance for footflat on footplate w/legrest in lowest position to promote knee flexion.   Wheelchair tilted and legrests removed to allow for dangling/prolonged stretch into knee flexion. Tolerated approx 5  min x 2   wc in upright added 2lb wt to ankle and pt performs AROM knee flex/extens w/10sec hold at end range flexion.  Repeated bilat x 8-10 reps. Pt tolerated OOB in wc, began to c/o nausea/need to return to bed.    Pt left supine w/rails up x 4, bed in lowest position, and needs in reach.  RN in room.  Therapy Documentation Precautions:  Precautions Precautions: Fall,Other (comment) Precaution Comments: Unrestricted ROM B knees, L ankle, B hips; PROM/AROM R wrist; colostomy; abdominal wound Required Braces or Orthoses: Splint/Cast Splint/Cast: RUE wrist splint on when mobilizing Restrictions Weight Bearing Restrictions: Yes RUE Weight Bearing: Weight bearing as tolerated LUE Weight Bearing: Weight bearing as tolerated RLE Weight Bearing: Weight bearing as tolerated LLE Weight Bearing: Partial weight bearing LLE Partial Weight Bearing Percentage or Pounds: 50  Therapy/Group: Individual Therapy  [name], PT   [name] 12/18/2020, 12:19 PM

## 2020-12-18 NOTE — Progress Notes (Signed)
Physical Therapy Session Note  Patient Details  Name: Sara [name] MRN: [id number] Date of Birth: 03/15/1966  Today's Date: 12/18/2020 PT Individual Time: 6599-3570 PT Individual Time Calculation (min): 40 min   Short Term Goals: Week 1:  PT Short Term Goal 1 (Week 1): Pt will perform supine<>sit with max assist of 1 PT Short Term Goal 2 (Week 1): Pt will perform bed<>chair transfers using LRAD with max assist of 1 and +2 min assist PT Short Term Goal 3 (Week 1): Pt will perform sit<>stands using equipment as needed with +2 max assist PT Short Term Goal 4 (Week 1): Pt will tolerate sitting upright, OOB in TIS w/c for at least 3 total hours a day  Skilled Therapeutic Interventions/Progress Updates:    Patient in supine reports not feeling up to mobility due to pain and nausea.  Encouraged and participated in bed level activity.  Supine therex AAROM L knee flexion x 10, ankle pumps x 20, L ankle AAROM with end range stretch 20 sec hold x 5, adductor squeezes x 10, SAQ x 10, measured AROM L knee 39 degrees.  Supine hip abduction x 10.  Patient agreed to attempt tilting in bed.  Applied straps and pt tilted to 35 degrees maintained x 2 minutes, then up to 40 degrees x 2 minutes, then 45 degrees and pt with nausea and vomiting.  Slowly lowered to 0 and assisted to scoot up in bed.  Left with HOB elevated and RN in the room.  Pt missed 20 minutes skilled PT due to nausea.   Therapy Documentation Precautions:  Precautions Precautions: Fall,Other (comment) Precaution Comments: Unrestricted ROM B knees, L ankle, B hips; PROM/AROM R wrist; colostomy; abdominal wound Required Braces or Orthoses: Splint/Cast Splint/Cast: RUE wrist splint on when mobilizing Restrictions Weight Bearing Restrictions: Yes RUE Weight Bearing: Weight bearing as tolerated LUE Weight Bearing: Weight bearing as tolerated RLE Weight Bearing: Weight bearing as tolerated LLE Weight Bearing: Partial weight bearing LLE  Partial Weight Bearing Percentage or Pounds: 50 General: PT Amount of Missed Time (min): 20 Minutes PT Missed Treatment Reason: Patient ill (Comment) (N&V) Pain: Pain Assessment Pain Score: 7  Pain Type: Acute pain Pain Location: Abdomen Pain Orientation: Lower Pain Descriptors / Indicators: Aching Pain Onset: On-going Pain Intervention(s): Repositioned    Therapy/Group: Individual Therapy  [name]  [place], [place] 12/18/2020, 6:11 PM

## 2020-12-19 ENCOUNTER — Inpatient Hospital Stay (HOSPITAL_COMMUNITY): Payer: Medicaid Other

## 2020-12-19 ENCOUNTER — Inpatient Hospital Stay (HOSPITAL_COMMUNITY): Payer: Medicaid Other | Admitting: Occupational Therapy

## 2020-12-19 DIAGNOSIS — G7281 Critical illness myopathy: Secondary | ICD-10-CM | POA: Diagnosis not present

## 2020-12-19 LAB — COMPREHENSIVE METABOLIC PANEL
ALT: 18 U/L (ref 0–44)
AST: 24 U/L (ref 15–41)
Albumin: 2 g/dL — ABNORMAL LOW (ref 3.5–5.0)
Alkaline Phosphatase: 98 U/L (ref 38–126)
Anion gap: 11 (ref 5–15)
BUN: 5 mg/dL — ABNORMAL LOW (ref 6–20)
CO2: 23 mmol/L (ref 22–32)
Calcium: 8.9 mg/dL (ref 8.9–10.3)
Chloride: 104 mmol/L (ref 98–111)
Creatinine, Ser: 0.4 mg/dL — ABNORMAL LOW (ref 0.44–1.00)
GFR, Estimated: >60 mL/min (ref >60)
Glucose, Bld: 91 mg/dL (ref 70–99)
Potassium: 3 mmol/L — ABNORMAL LOW (ref 3.5–5.1)
Sodium: 138 mmol/L (ref 135–145)
Total Bilirubin: 0.6 mg/dL (ref 0.3–1.2)
Total Protein: 5.7 g/dL — ABNORMAL LOW (ref 6.5–8.1)

## 2020-12-19 MED ORDER — POTASSIUM CHLORIDE CRYS ER 20 MEQ PO TBCR
40.0000 meq | EXTENDED_RELEASE_TABLET | Freq: Two times a day (BID) | ORAL | Status: AC
  Administered 2020-12-19 (×2): 40 meq via ORAL
  Filled 2020-12-19 (×2): qty 2

## 2020-12-19 MED ORDER — TAMSULOSIN HCL 0.4 MG PO CAPS
0.4000 mg | ORAL_CAPSULE | Freq: Every day | ORAL | Status: DC
  Administered 2020-12-19 – 2020-12-24 (×6): 0.4 mg via ORAL
  Filled 2020-12-19 (×6): qty 1
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
## 2020-12-19 NOTE — Progress Notes (Signed)
Physical Therapy Session Note  Patient Details  Name: Sara [name] MRN: [id number] Date of Birth: 08/20/1966  Today's Date: 12/19/2020 PT Individual Time: 1025-1100 PT Individual Time Calculation (min): 35 min   Short Term Goals: Week 1:  PT Short Term Goal 1 (Week 1): Pt will perform supine<>sit with max assist of 1 PT Short Term Goal 2 (Week 1): Pt will perform bed<>chair transfers using LRAD with max assist of 1 and +2 min assist PT Short Term Goal 3 (Week 1): Pt will perform sit<>stands using equipment as needed with +2 max assist PT Short Term Goal 4 (Week 1): Pt will tolerate sitting upright, OOB in TIS w/c for at least 3 total hours a day  Skilled Therapeutic Interventions/Progress Updates:    pt received in bed and agreeable to therapy. However reported she was waiting on nursing staff to cath her before getting up. Nursing brought to bedside for this and completed however pt missed 10 mins scheduled PT time for this. Pt reported 5/10 pain at L leg and R knee at start of therapy. Pt directed in supine>sit mod A with slow technique to complete, need for BLE management to assist in placement at EOB, and trunk support into sitting. Once at EOB, pt benefits from step placed at feet for support then pt able to adjust Michl at EOB for improved balance at min A. Pt required total A for slide board placement with VC for trunk lean to complete, mod A to return to midline. Pt directed in slide board transfer to TIS, VC for hips/head relationship however pt ultimately required max A x2 to fully transfer into WC seat. With B armrests in place, pt able to adjust [place] in sitting. Pt taken total A to gym as unable to wheel Gatz in TIS [facility] and agreeable to leave room. Pt directed in prolonged stretching of L knee for increased knee flexion with pt able to [place] mobilize to initiate however benefited from slight overpressure to pt's tolerance for static stretch, 5x1 min each. Pt then returned to room, total A  and left in WC, slightly reclined to comfort, alarm set, All needs in reach and in good condition. Call light in hand.    Therapy Documentation Precautions:  Precautions Precautions: Fall,Other (comment) Precaution Comments: Unrestricted ROM B knees, L ankle, B hips; PROM/AROM R wrist; colostomy; abdominal wound Required Braces or Orthoses: Splint/Cast Splint/Cast: RUE wrist splint on when mobilizing Restrictions Weight Bearing Restrictions: Yes RUE Weight Bearing: Weight bearing as tolerated LUE Weight Bearing: Weight bearing as tolerated RLE Weight Bearing: Weight bearing as tolerated LLE Weight Bearing: Partial weight bearing LLE Partial Weight Bearing Percentage or Pounds: 50 General: PT Amount of Missed Time (min): 10 Minutes PT Missed Treatment Reason: Nursing care Vital Signs:   Pain: Pain Assessment Pain Scale: 0-10 Pain Score: 4  Pain Type: Acute pain Pain Location: Knee Pain Orientation: Right;Left Pain Frequency: Constant Pain Onset: On-going Patients Stated Pain Goal: 1 Pain Intervention(s): Medication (See eMAR) Mobility:   Locomotion :    Trunk/Postural Assessment :    Balance:   Exercises:   Other Treatments:      Therapy/Group: Individual Therapy  Barbaraann Faster 12/19/2020, 12:19 PM

## 2020-12-19 NOTE — Progress Notes (Addendum)
RN bladder scanned pt and got . RN asked pt if she could urinate. Pt declined and said she will urinate in a little while. RN told pt that she would have to in and out cath and educated pt on the risk of holding urine in your bladder. Pt refused for RN to in and out cath. Pt stated," I will pee around 5. I am alsleep and do not want to be bothered." Pt alert and oriented x4. RN notified charge nurse.RN will continue to monitor and reassess at 5am.

## 2020-12-19 NOTE — Consult Note (Signed)
WOC Nurse ostomy follow up Patient receiving care in [facility] 4W 23. Primary RN, Morrie Sheldon, at bedside for ostomy care teaching. Stoma type/location: LUQ colostomy Stomal assessment/size: 1 1/4 inches, round, moist MCS pocket to left of stoma is now 2.8 cm deep Peristomal assessment: Wound below the stoma and under the ostomy barrier has opened up.  It measures 1 cm x 4 cm and is 100% pink Treatment options for stomal/peristomal skin: Aquacel into MCS pocket, barrier ring around the stoma and over the wound. Output: thin yellow  Ostomy pouching: 2pc. See ordered entered today Education provided: how to clean around the stoma, how to pack the MCS pocket, how to treat the wound, how to use barrier rings, how to measure the stoma. Enrolled patient in [place] Secure Start Discharge program: No  Abdominal wound now measures 8 cm x 8 cm and has no measureable depth. Margins are re-epithelializing. The left tunnel has completely closed,  The right tunnel measures 0.7 cm. Helmut Muster, RN, MSN, CWOCN, CNS-BC, pager [phone]
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
## 2020-12-19 NOTE — Progress Notes (Signed)
Occupational Therapy Session Note  Patient Details  Name: Sara [name] MRN: [id number] Date of Birth: May 22, 1966  Today's Date: 12/19/2020 OT Individual Time: 1415-1530 OT Individual Time Calculation (min): 75 min    Short Term Goals: Week 1:  OT Short Term Goal 1 (Week 1): Pt will complete UB dressing with min assist OT Short Term Goal 2 (Week 1): pt will complete bathing with mod assist of one caregiver OT Short Term Goal 3 (Week 1): Pt will complete LB dressing mod assist of one caregiver OT Short Term Goal 4 (Week 1): Pt will complete toilet transfer with mod assist of one caregiver  Skilled Therapeutic Interventions/Progress Updates:    Pt supine in bed, reports stiffness in BLE intermittently throughout session.  Agreeable to bathing/dressing EOB, but does not want to get in w/c due to feeling tired from previous therapies today. Pt reports she does not wish to wear hospital scrub pants and prefers hospital gown.  Encouraged pt to request available family to bring her personal preferred clothing to facilitate increased ADL participation and pt agreeable to this.  Pt completed supine to sit with max assist and step by step VCs for body mechanics. Mattress deflated and step stool placed under BLE for increased support and balance. Pt doffed hospital gown with setup to untie back.  UB bathing completed with min assist to reach back due to limited ROM and strength BUE.  Pt bathed anterior upper BLE and lower BLE to ankles with CGA.  Pt applied lotion as well with CGA.  Pt brushed hair with supervision sitting EOB.  Pt refusing use of toothpaste for oral care due to not liking mint flavor, however she did brush teeth using water only with supervision.    Pt participated in BUE AROM to increase strength and postural control due to noted forward rounded shoulders and kyphotic resting posture sitting EOB.  Pt completed scapular retractions and shoulder scaption seated EOB, then supine horizontal  abd/adduction (sit to supine completed with max assist and +2 total assist to reposition toward HOB) ; 2 x 10 reps of each with min RBs in between needed due to fatigue.  Pt required intemittent VCs for improved body mechanics. Pt exhibits significant weakness in bilateral shoulders and periscapular musculature resulting in poor seated posture and limited reach during Shakespeare care; able to achieve approximately 90 degrees of shoulder scaption only, however noted PROM WNL, only slight tightness noted at end range. Call bell in reach, bed lowered at end of session.  Therapy Documentation Precautions:  Precautions Precautions: Fall,Other (comment) Precaution Comments: Unrestricted ROM B knees, L ankle, B hips; PROM/AROM R wrist; colostomy; abdominal wound Required Braces or Orthoses: Splint/Cast Splint/Cast: RUE wrist splint on when mobilizing Restrictions Weight Bearing Restrictions: Yes RUE Weight Bearing: Weight bearing as tolerated LUE Weight Bearing: Weight bearing as tolerated RLE Weight Bearing: Weight bearing as tolerated LLE Weight Bearing: Partial weight bearing LLE Partial Weight Bearing Percentage or Pounds: 50   Therapy/Group: Individual Therapy  Amie Critchley 12/19/2020, 5:05 PM

## 2020-12-19 NOTE — Progress Notes (Signed)
This chaplain is present for F/U spiritual care. The Pt. is in the bedside chair during the visit. The Pt. is looking forward to the nausea improving with the end of the antibiotics.  The Pt. remains hopeful therapy will allow her to return home. The chaplain understands the family will postpone holiday celebrations until the Pt. is able to join the family. The chaplain is observing a change in the Pt. communication. The Pt. is spending more time adding details to her story.    The chaplain will continue with spiritual care.

## 2020-12-19 NOTE — Progress Notes (Signed)
Patient cathed for 725cc at 1030

## 2020-12-19 NOTE — Progress Notes (Signed)
Potassium 3.0 this morning. RN will notify am nurse to continue potassium replacement. RN will continue to monitor. Pt in no acute distress.

## 2020-12-19 NOTE — Progress Notes (Signed)
jPhysical Therapy Session Note  Patient Details  Name: Sara [name] MRN: [id number] Date of Birth: 1966/01/29  Today's Date: 12/19/2020 PT Individual Time: 1300-1330 PT Individual Time Calculation (min): 30 min   Short Term Goals: Week 1:  PT Short Term Goal 1 (Week 1): Pt will perform supine<>sit with max assist of 1 PT Short Term Goal 2 (Week 1): Pt will perform bed<>chair transfers using LRAD with max assist of 1 and +2 min assist PT Short Term Goal 3 (Week 1): Pt will perform sit<>stands using equipment as needed with +2 max assist PT Short Term Goal 4 (Week 1): Pt will tolerate sitting upright, OOB in TIS w/c for at least 3 total hours a day Week 2:    Week 3:     Skilled Therapeutic Interventions/Progress Updates:    AM SESSION PAIN 7/10 knee pain w/wbing, rest breaks as needed.   Utilized tilt feature of KREG bed TO ADDRESS TOLERANCE FOR UPRIGHT AND WBING TO IMPROVE ROM Pt rolls w/max assist for placement of sheet under pt.to facilitate repositioning w/feet on footboard/decrease shear w/this. tilti of bed and vitals as below:   BP 114/71  hr 92 at 12*      122/70     Hr 94     At 21*        115/68                  At 30*  Returned flat due to knee/ankle pain after 5 min at 30* Repeated gradual progression, vitals remain stable/disc monitoring as pain was primary limiting factor to advancemet. 109/62       HR 90   At 17 Pt progressively increased to 32* w/soft tissue mobs of plantar fascia and gentle subtalar joint mobs performed intermittently to facilitate wbing tolerance/ROM at foot/ankle Returned flat Repeated incline w/repositioning performed at knees/ankles as needed  Pt requested pain meds/nausea meds, nursing notified and pt repositioned w/HOB elevated for med admin.   Pt left supine w/rails up x 3, alarm set, bed in lowest position, and needs in reach.  Nurse at bedside.  PM SESSION PAIN 6/10 pain knees w/therex, limited to pt tolerance. Pt initially oob in  wc and requesting assist back to bed.  In wc allowed LEs to dangle x 6 min for knee flexion stretch, intermittent sets of 3 LE extension/flexion return to dangle.  wc to bed sliding board transfer w/feet on stool w/max assist of 2 but improved effort to boost by pt and improved sequencing/hand placement, wt shifting.  Once on edge of bed, pt did slowly assist w/scooting in sitting.  Sit to side to supine w/max assist for lower body management.   In supine, pt able to scoot w/cues for hand placement, sequencing, mod assist.  Supine TKEs x 10 each LE. Pt repositioned for comfort.  Pt left supine w/rails up x 3, alarm set, bed in lowest position, and needs in reach.   Therapy Documentation Precautions:  Precautions Precautions: Fall,Other (comment) Precaution Comments: Unrestricted ROM B knees, L ankle, B hips; PROM/AROM R wrist; colostomy; abdominal wound Required Braces or Orthoses: Splint/Cast Splint/Cast: RUE wrist splint on when mobilizing Restrictions Weight Bearing Restrictions: Yes RUE Weight Bearing: Weight bearing as tolerated LUE Weight Bearing: Weight bearing as tolerated RLE Weight Bearing: Weight bearing as tolerated LLE Weight Bearing: Partial weight bearing LLE Partial Weight Bearing Percentage or Pounds: 50   Therapy/Group: Individual Therapy  Sara [name], PT   Sara [name] 12/19/2020, 2:48 PM

## 2020-12-19 NOTE — Progress Notes (Signed)
RN bladder scanned pt and got . RN tried getting the pt up, changing position, bladder stimulation, and asking the pt if RN could in and out cath. Pt refused all interventions. Pt stated, " I feel sensation, I just don't want to try right now and I do not want to be in and out cath." RN notified Jesusita Oka, [place] and charge nurse. RN will continue to monitor.

## 2020-12-19 NOTE — Progress Notes (Signed)
Pt refused for RN to change any wound dressings. RN educated pt on risks of not changing dressing. Pt still refused. RN did change ostomy bag dressing. No leaking at this time. RN will continue to monitor.

## 2020-12-19 NOTE — Progress Notes (Signed)
Patient bladder scanned for 129cc. Patient reports not drinking as much. Education done again regarding need to cath at 8 hour mark and correlation with urinary infections. Patient unable to void and refused cathing at this time. Encouraged fluids.

## 2020-12-19 NOTE — Progress Notes (Signed)
MD Lovorn requesting gatorade for this patient. Request made to nutrition stock room.

## 2020-12-20 ENCOUNTER — Inpatient Hospital Stay (HOSPITAL_COMMUNITY): Payer: Medicaid Other | Admitting: Occupational Therapy

## 2020-12-20 ENCOUNTER — Inpatient Hospital Stay (HOSPITAL_COMMUNITY): Payer: Medicaid Other

## 2020-12-20 DIAGNOSIS — G7281 Critical illness myopathy: Secondary | ICD-10-CM | POA: Diagnosis not present
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
## 2020-12-20 NOTE — Progress Notes (Addendum)
Physical Therapy Session Note  Patient Details  Name: Sara [name] MRN: [id number] Date of Birth: 17-Dec-1966  Today's Date: 12/20/2020 PT Individual Time: 1105-1200; 1255-1305 PT Individual Time Calculation (min): 55 min , 10 min  Short Term Goals: Week 1:  PT Short Term Goal 1 (Week 1): Pt will perform supine<>sit with max assist of 1 PT Short Term Goal 2 (Week 1): Pt will perform bed<>chair transfers using LRAD with max assist of 1 and +2 min assist PT Short Term Goal 3 (Week 1): Pt will perform sit<>stands using equipment as needed with +2 max assist PT Short Term Goal 4 (Week 1): Pt will tolerate sitting upright, OOB in TIS w/c for at least 3 total hours a day  Skilled Therapeutic Interventions/Progress Updates:  tx 1:  Pt asleep in bed easily awakened.  She rated pain R knee 8/10.    Therapeutic exercise performed with LEs to increase strength for functional mobility: 10 x 1 bil ankle pumps; 2 x 1 quad sets bil.  Pt began vomiting, raising HOB herself while PT placed emesis basin under her.  She vomited clear yellow liquid/dry heaved several times.  PT requested pain meds and nausea meds.  After resting for a few mmiutes, pt able to continue.  With HOB raised, rolling R with min assist> sitting up with mod assist for LLE management and to fully elevate trunk.  Pt c/o wooziness, which passed in a couple of minutes.    Slide board transfer with max +1>+2 with mod cues for technique, slightly downhill, using disposable bed pad under her to cover skin. Pain L knee rated 5/10 after getting OOB.   Pt tolerated sitting upright when in tilt in space chair. Pt able to state her WB precautions. PT discussed pt's STGs with her to build rapport and confidence in therapy; pt receptive.  Use of bari Stedy for partial supported standing/high sitting, +2 assist, using bed pad.  Max cues for technique.  Pt tolerated partial supported standing x 10 minutes .  Duration limited by pt's  tolerance/"feeling hot".  At end of session, pt sitting upright in chair, with needs at hand and seat belt alarm set.  Pt agreed to attempt sitting up until 1:00.  tx 2:  Pt sitting upright in w/c, but stated that she was very tired and wanted to get back to bed.  Slide board transfer as above to return to bed; +2 assist needed throughout as return to bed is slightly  [place].  Sit> supine with mod assist.  At end of session, pt resting in supine with pillows under bil LLs, and needs at hand.     Therapy Documentation Precautions:  Precautions Precautions: Fall,Other (comment) Precaution Comments: Unrestricted ROM B knees, L ankle, B hips; PROM/AROM R wrist; colostomy; abdominal wound Required Braces or Orthoses: Splint/Cast Splint/Cast: RUE wrist splint on when mobilizing Restrictions Weight Bearing Restrictions: Yes RUE Weight Bearing: Weight bearing as tolerated LUE Weight Bearing: Weight bearing as tolerated RLE Weight Bearing: Weight bearing as tolerated LLE Weight Bearing: Partial weight bearing LLE Partial Weight Bearing Percentage or Pounds: 50  Pain Intervention(s): Repositioned       Therapy/Group: Individual Therapy  Sara [name] 12/20/2020, 12:11 PM

## 2020-12-20 NOTE — Progress Notes (Signed)
RN cath pt and got 250 ml. Pt went 6 hours with no void. RN will continue to monitor and bladder scan at [facility]

## 2020-12-20 NOTE — Progress Notes (Signed)
Pt complained of left heel pain. RN elevated heel with 3 pillows to keep pressure off the heel. RN will continue to monitor and notify day shift of heel pain in case of further interventions

## 2020-12-20 NOTE — Progress Notes (Signed)
Occupational Therapy Session Note  Patient Details  Name: Sara [name] MRN: [id number] Date of Birth: 1966-01-05  Today's Date: 12/20/2020 OT Individual Time: 4098-1191 and 4782-9562 OT Individual Time Calculation (min): 56 min and 50 min Missed 25 minute d/t fatigue/pain/nausea in PM session   Short Term Goals: Week 1:  OT Short Term Goal 1 (Week 1): Pt will complete UB dressing with min assist OT Short Term Goal 2 (Week 1): pt will complete bathing with mod assist of one caregiver OT Short Term Goal 3 (Week 1): Pt will complete LB dressing mod assist of one caregiver OT Short Term Goal 4 (Week 1): Pt will complete toilet transfer with mod assist of one caregiver  Skilled Therapeutic Interventions/Progress Updates:    Session 1: Pt greeted at time of session supine in bed resting, requiring extended time to wake up and once awake, educated on role and purpose of OT. Pt had not yet eaten breakfast, declined sitting EOB in or chair to eat but did raise HOB to eat, educated on positioning for Sorlie feeding for max benefit. Declined dressing today stating she wanted to remain in gown. Reviewed precautions and WB status prior to activity and pt verbalized understanding. Preparatory activity for BLE ROM focus on knee flexion/extension to warm up prior to sitting up, guarding limiting and difficulty correcting. Focus of session on bed mobility for brief change x2 dependently with pt rolling Mod/max a with use of bed rails. Supine to sit Mod A, seated activity EOB for approx 15 minutes with CGA while pt performed grooming and lotion application for comfort. Attempting scooting toward [facility] with block under feet, max/total for scooting HOB with minimal progress noted. Sit to supine Max A for comfort at L knee, scooted up in bed total A x2 helpers. Call bell in reach all needs met. No pain reported during session but some discomfort in L knee, no # given. Rest breaks PRN.  Session 2: Pt greeted at time of  session supine in bed resting with face covered, lights out, and stating she felt nauseous and did not want to participate in OT session. Therapist provided pt time to rest before returning approximately 20 minutes later and pt willing to participate. Focus of session on core strength, sitting balance, relaxation techniques, and BLE knee ROM. Supine <> Sit Mod A with extended time to complete, scooting to EOB Mod A as well. Dynamic seated activities EOB in reps of 10-15 with reaching in various planes/targets to challenge core/sitting balance. Pt fatigues quickly and frequent rest breaks required throughout. Trialed overpressure and AROM for L knee, but pt stating this was too painful and terminated activity. Shoulder rolls and scapular retractions at EOB for 2x15 with multimodal cues to decrease tension/tightness and improve mobility. Declined all OOB activity as well. Scooting toward [facility] with Max of 2, sit to supine Mod and scooted toward HOB in supine 2 helpers. Positioned for comfort with pillows and all needs met, call bell in reach.   Therapy Documentation Precautions:  Precautions Precautions: Fall,Other (comment) Precaution Comments: Unrestricted ROM B knees, L ankle, B hips; PROM/AROM R wrist; colostomy; abdominal wound Required Braces or Orthoses: Splint/Cast Splint/Cast: RUE wrist splint on when mobilizing Restrictions Weight Bearing Restrictions: Yes RUE Weight Bearing: Weight bearing as tolerated LUE Weight Bearing: Weight bearing as tolerated RLE Weight Bearing: Weight bearing as tolerated LLE Weight Bearing: Partial weight bearing LLE Partial Weight Bearing Percentage or Pounds: 50     Therapy/Group: Individual Therapy  Viona Gilmore  12/20/2020, 7:47 AM

## 2020-12-21 ENCOUNTER — Inpatient Hospital Stay (HOSPITAL_COMMUNITY): Payer: Medicaid Other | Admitting: Occupational Therapy

## 2020-12-21 ENCOUNTER — Inpatient Hospital Stay (HOSPITAL_COMMUNITY): Payer: Medicaid Other

## 2020-12-21 DIAGNOSIS — G7281 Critical illness myopathy: Secondary | ICD-10-CM | POA: Diagnosis not present

## 2020-12-21 LAB — BASIC METABOLIC PANEL WITH GFR
Anion gap: 10 (ref 5–15)
BUN: 6 mg/dL (ref 6–20)
CO2: 22 mmol/L (ref 22–32)
Calcium: 9.2 mg/dL (ref 8.9–10.3)
Chloride: 106 mmol/L (ref 98–111)
Creatinine, Ser: 0.48 mg/dL (ref 0.44–1.00)
GFR, Estimated: >60 mL/min
Glucose, Bld: 89 mg/dL (ref 70–99)
Potassium: 4.9 mmol/L (ref 3.5–5.1)
Sodium: 138 mmol/L (ref 135–145)
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
## 2020-12-21 NOTE — Progress Notes (Signed)
Physical Therapy Session Note  Patient Details  Name: Sara [name] MRN: [id number] Date of Birth: Sep 14, 1966  Today's Date: 12/21/2020 PT Individual Time: 1501-1531 PT Individual Time Calculation (min): 30 min   Short Term Goals: Week 1:  PT Short Term Goal 1 (Week 1): Pt will perform supine<>sit with max assist of 1 PT Short Term Goal 2 (Week 1): Pt will perform bed<>chair transfers using LRAD with max assist of 1 and +2 min assist PT Short Term Goal 3 (Week 1): Pt will perform sit<>stands using equipment as needed with +2 max assist PT Short Term Goal 4 (Week 1): Pt will tolerate sitting upright, OOB in TIS w/c for at least 3 total hours a day  Skilled Therapeutic Interventions/Progress Updates:     Pt received supine in bed. Attempts to refuse therapy due to feeling achy and nauseous, but is agreeable with gentle encouragement. Pt primarily complains of pain in L leg. Number not provided. PT provides mobility, repositioning, and rest breaks to manage pain. Supine to sit with modA and cues on logrolling technique and body mechanics. Pt performs 1x10 bilateral LAQs with cues for eccentric control and activation of hamstrings for knee flexion. Pt then uses exercise ball, holding it with both hands and rotating at trunk for core engagement and balance challenge, 1x10. Pt performs lateral sidebending in either direction using ball to assist with sidebody extension, 1x10. Pt performs forward trunk flexion and hip flexion with ball on thighs to increase pt confidence with anterior weight shifting.   Sit to supine with modA. PT places CPM on L lower extremity with flexion set to 30 degrees and extension at 0 degrees. CPM occasionally shutting off without notice. RN aware and plans to call [employer]. All needs within reach.  Therapy Documentation Precautions:  Precautions Precautions: Fall,Other (comment) Precaution Comments: Unrestricted ROM B knees, L ankle, B hips; PROM/AROM R wrist;  colostomy; abdominal wound Required Braces or Orthoses: Splint/Cast Splint/Cast: RUE wrist splint on when mobilizing Restrictions Weight Bearing Restrictions: No RUE Weight Bearing: Weight bearing as tolerated LUE Weight Bearing: Partial weight bearing LUE Partial Weight Bearing Percentage or Pounds: 50 RLE Weight Bearing: Weight bearing as tolerated LLE Weight Bearing: Partial weight bearing LLE Partial Weight Bearing Percentage or Pounds: 50    Therapy/Group: Individual Therapy  Beau Fanny, PT, DPT 12/21/2020, 3:44 PM

## 2020-12-21 NOTE — Progress Notes (Signed)
Physical Therapy Session Note  Patient Details  Name: Sara [name] MRN: [id number] Date of Birth: December 20, 1966  Today's Date: 12/21/2020 PT Individual Time: 0903-1000 PT Individual Time Calculation (min): 57 min   Short Term Goals: Week 1:  PT Short Term Goal 1 (Week 1): Pt will perform supine<>sit with max assist of 1 PT Short Term Goal 2 (Week 1): Pt will perform bed<>chair transfers using LRAD with max assist of 1 and +2 min assist PT Short Term Goal 3 (Week 1): Pt will perform sit<>stands using equipment as needed with +2 max assist PT Short Term Goal 4 (Week 1): Pt will tolerate sitting upright, OOB in TIS w/c for at least 3 total hours a day  Skilled Therapeutic Interventions/Progress Updates:    Session focused on progressing upright mobility to progress standing tolerance and increase functional transfers. Pt requires mod assist to roll and to come to EOB with use of bedrails for support. Demonstrates dynamic sitting balance with supervision. Assisted with donning of pants EOB (encourged pt to work on getting dressed (pants intiially) to promote improved mental state to feel less like in hospital). Using [place] and bed slightly elevated with several attempts with +2 assist and cues for anterior weightshift and activation of BLE musculature. Pt able to achieve standing to get flaps of stedy under her. Then transferred to w/c and performed partial sit > stand x 2 to get pants pulled up and then again fully to return down to the w/c. Mod to max +2 for sit <> stand attempts depending on height and use of modified perch sitting.   Discussed and educated at length importance of use of CPM for ROM (orders to be adjusted by MD) and may require changing out of Kreg bed if this bed does not allow for fit. Plan to return later this PM to assist with set up and trial of CPM on bed. Educated on option for progression of standing with Stedy, tilt table, parallel bars, etc. Education provided on overall  goals and realistic need to increase upright mobility to progress functionally.  Seated LLE ROM/stretching into knee flexion in seated position during education discussions.   Pt declines wearing wrist brace during mobility this session.   Therapy Documentation Precautions:  Precautions Precautions: Fall,Other (comment) Precaution Comments: Unrestricted ROM B knees, L ankle, B hips; PROM/AROM R wrist; colostomy; abdominal wound Required Braces or Orthoses: Splint/Cast Splint/Cast: RUE wrist splint on when mobilizing Restrictions Weight Bearing Restrictions: No RUE Weight Bearing: Weight bearing as tolerated LUE Weight Bearing: Partial weight bearing LUE Partial Weight Bearing Percentage or Pounds: 50 RLE Weight Bearing: Weight bearing as tolerated LLE Weight Bearing: Partial weight bearing LLE Partial Weight Bearing Percentage or Pounds: 50 Pain:  Reports mainly nausea as complaint (just received medication from RN). Does report some pain in bilateral knees during standing but does not rate.    Therapy/Group: Individual Therapy  Karolee Stamps Darrol Poke, PT, DPT, CBIS  12/21/2020, 10:51 AM

## 2020-12-21 NOTE — Progress Notes (Signed)
Patient ID: Sara [name], female   DOB: 04/23/1966, 54 y.o.   MRN: [id number]  Patient has been transitioned to inpatient CIR rehabilitation unit.  This NP visited patient at the bedside as a follow up to for palliative medicine needs and emotional support.  She is in bed, sleeping covered with blankets.  Her voice is weak, she is lethargic.  Created space and opportunity for patient to explore her thoughts and feelings regarding current medical situation.  I acknowledged how difficult of a  time both physically and psychologically this has been for her.    Today I introduced concepts specific to patient's autonomy as it relates to decisions regarding life prolonging measures. Education offered on the difference between aggressive medical intervention path and a palliative comfort path for this patient at this time in this situation.  Sara [name] verbalizes her continued hope for improvement and desire for ongoing current treatment measures.  Ongoing education regarding the importance of  personal responsibility in treatment plan.  Again introduced mind, body, spirit and positive talk/ affirmations.   Questions and concerns addressed   Discussed with OT        PMT will continue to support holistically  Total time spent on the unit was 25 minutes  Greater than 50% of the time was spent in counseling and coordination of care  [name] NP  Palliative Medicine Team Team Phone # [phone] Pager [phone]
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
## 2020-12-21 NOTE — Progress Notes (Signed)
Occupational Therapy Session Note  Patient Details  Name: Sara [name] MRN: [id number] Date of Birth: 28-Mar-1966  Today's Date: 12/21/2020 OT Missed Time: 60 Minutes Missed Time Reason: Patient fatigue;Patient ill (comment)   Short Term Goals: Week 1:  OT Short Term Goal 1 (Week 1): Pt will complete UB dressing with min assist OT Short Term Goal 2 (Week 1): pt will complete bathing with mod assist of one caregiver OT Short Term Goal 3 (Week 1): Pt will complete LB dressing mod assist of one caregiver OT Short Term Goal 4 (Week 1): Pt will complete toilet transfer with mod assist of one caregiver   Skilled Therapeutic Interventions/Progress Updates:    Pt greeted at time of session supine in bed resting with lights off, woken with moderate verbal stimuli. Pt stating she feels "terrible" and when asked to elaborate, pt stating she feels "achy" all over and has a headache. Returned 20 minutes later with offer to continue therapy session, pt minimally opening her eyes and decreased verbal communication, continuing to decline OT session. Missed 60 minutes of OT.   Therapy Documentation Precautions:  Precautions Precautions: Fall,Other (comment) Precaution Comments: Unrestricted ROM B knees, L ankle, B hips; PROM/AROM R wrist; colostomy; abdominal wound Required Braces or Orthoses: Splint/Cast Splint/Cast: RUE wrist splint on when mobilizing Restrictions Weight Bearing Restrictions: No RUE Weight Bearing: Weight bearing as tolerated LUE Weight Bearing: Partial weight bearing LUE Partial Weight Bearing Percentage or Pounds: 50 RLE Weight Bearing: Weight bearing as tolerated LLE Weight Bearing: Partial weight bearing LLE Partial Weight Bearing Percentage or Pounds: 50     Therapy/Group: Individual Therapy  Erasmo Score 12/21/2020, 1:38 PM

## 2020-12-21 NOTE — Progress Notes (Signed)
Physical Therapy Contact Note  Patient Details  Name: Sara [name] MRN: [id number] Date of Birth: 1966-06-29   Attempted to see patient for 30 minutes of make-up time. Arrived to pt room to find her supine in bed with lights off and blankets over her head. Pt states "I still feel rotten" describing her symptoms as feeling "nauseated" with "body aches." Pt requesting to rest at this time. Left supine in bed with RN notified of pt's symptoms.   Ginny Forth , PT, DPT, CSRS  12/21/2020, 5:46 PM

## 2020-12-21 NOTE — Progress Notes (Signed)
Occupational Therapy Session Note  Patient Details  Name: Sara [name] MRN: [id number] Date of Birth: 10-08-1966  Today's Date: 12/21/2020 OT Missed Time: 60 Minutes Missed Time Reason: Patient unwilling/refused to participate without medical reason   Short Term Goals: Week 1:  OT Short Term Goal 1 (Week 1): Pt will complete UB dressing with min assist OT Short Term Goal 2 (Week 1): pt will complete bathing with mod assist of one caregiver OT Short Term Goal 3 (Week 1): Pt will complete LB dressing mod assist of one caregiver OT Short Term Goal 4 (Week 1): Pt will complete toilet transfer with mod assist of one caregiver  Skilled Therapeutic Interventions/Progress Updates:    Attempted to see pt at scheduled 7am time. Pt refusing d/t nausea. Pt educated on participation in tx to improve functional independence but pt continues to decline. Will attempt to follow up per POC.  Came back in afternoon at 1530 pt on CPM and unable to participate in tx at this time.   Therapy Documentation Precautions:  Precautions Precautions: Fall,Other (comment) Precaution Comments: Unrestricted ROM B knees, L ankle, B hips; PROM/AROM R wrist; colostomy; abdominal wound Required Braces or Orthoses: Splint/Cast Splint/Cast: RUE wrist splint on when mobilizing Restrictions Weight Bearing Restrictions: No RUE Weight Bearing: Weight bearing as tolerated LUE Weight Bearing: Partial weight bearing LUE Partial Weight Bearing Percentage or Pounds: 50 RLE Weight Bearing: Weight bearing as tolerated LLE Weight Bearing: Partial weight bearing LLE Partial Weight Bearing Percentage or Pounds: [address]  Shon Hale 12/21/2020, 6:47 AM

## 2020-12-22 ENCOUNTER — Inpatient Hospital Stay (HOSPITAL_COMMUNITY): Payer: Medicaid Other | Admitting: Physical Therapy

## 2020-12-22 ENCOUNTER — Inpatient Hospital Stay (HOSPITAL_COMMUNITY): Payer: Medicaid Other

## 2020-12-22 ENCOUNTER — Inpatient Hospital Stay (HOSPITAL_COMMUNITY): Payer: Medicaid Other | Admitting: Occupational Therapy

## 2020-12-22 DIAGNOSIS — G7281 Critical illness myopathy: Secondary | ICD-10-CM | POA: Diagnosis not present

## 2020-12-22 MED ORDER — PROMETHAZINE HCL 12.5 MG PO TABS
12.5000 mg | ORAL_TABLET | Freq: Four times a day (QID) | ORAL | Status: DC | PRN
  Filled 2020-12-22: qty 1

## 2020-12-22 MED ORDER — ONDANSETRON HCL 4 MG PO TABS
4.0000 mg | ORAL_TABLET | Freq: Three times a day (TID) | ORAL | Status: DC
  Administered 2020-12-22 – 2021-01-17 (×75): 4 mg via ORAL
  Filled 2020-12-22 (×76): qty 1

## 2020-12-22 MED ORDER — LORAZEPAM 0.5 MG PO TABS: 0.5000 mg | ORAL_TABLET | Freq: Four times a day (QID) | ORAL | Status: DC | PRN

## 2020-12-22 NOTE — Progress Notes (Signed)
Physical Therapy Note  Patient Details  Name: Sara [name] MRN: [id number] Date of Birth: 1966/04/01 Today's Date: 12/22/2020    Attempted to see patient for scheduled therapy session. Pt declines participation at this time due to fatigue. Pt missed 30 min of scheduled therapy session due to fatigue. Will follow up per POC.    Peter Congo, PT, DPT  12/22/2020, 4:59 PM

## 2020-12-22 NOTE — Progress Notes (Signed)
Occupational Therapy Weekly Progress Note  Patient Details  Name: Sara [name] MRN: [id number] Date of Birth: Oct 22, 1966  Beginning of progress report period: December 15, 2020 End of progress report period: December 22, 2020  Today's Date: 12/22/2020 OT Individual Time: 8242-3536 and 1102-1200 OT Individual Time Calculation (min): 69 min and 58 min   Patient has met 2 of 4 short term goals.  Pt has been limited d/t nausea/feeling ill limiting her participation in OT sessions. However, when pt is feeling well she has performed bathing tasks at EOB with Mod A overall, performing bed mobility with Mod A overall in prep for ADL, and slide board transfers with 2 helpers to increase time OOB.   Patient continues to demonstrate the following deficits: muscle weakness, decreased cardiorespiratoy endurance, impaired timing and sequencing and decreased motor planning and decreased sitting balance, decreased standing balance, decreased postural control and decreased balance strategies and therefore will continue to benefit from skilled OT intervention to enhance overall performance with BADL and Reduce care partner burden.  Patient progressing toward long term goals..  Continue plan of care.  OT Short Term Goals Week 1:  OT Short Term Goal 1 (Week 1): Pt will complete UB dressing with min assist OT Short Term Goal 1 - Progress (Week 1): Met OT Short Term Goal 2 (Week 1): pt will complete bathing with mod assist of one caregiver OT Short Term Goal 2 - Progress (Week 1): Met OT Short Term Goal 3 (Week 1): Pt will complete LB dressing mod assist of one caregiver OT Short Term Goal 3 - Progress (Week 1): Progressing toward goal OT Short Term Goal 4 (Week 1): Pt will complete toilet transfer with mod assist of one caregiver OT Short Term Goal 4 - Progress (Week 1): Progressing toward goal Week 2:  OT Short Term Goal 1 (Week 2): Pt will perform toilet transfer with Max A of 1 OT Short Term Goal 2  (Week 2): Pt will perform sit to stand with no more than Max A of 1 OT Short Term Goal 3 (Week 2): Pt will perform LB dress with Mod A OT Short Term Goal 4 (Week 2): Pt will perform bathing with Min A and use of AE PRN  Skilled Therapeutic Interventions/Progress Updates:    Session 1: Pt greeted at time of session supine in bed resting sleeping but woken with verbal stimuli, not feeling well but agreeable to OT session with encouragement. After returning with supplies, supine to sit EOB Mod A and declined getting OOB to perform bathing at sink level but agreeable to EOB. UB bathing Supervision excluding washing her back, LB bathing for anterior aspect of BLEs but declined buttocks/periarea and assist for washing BLEs past knee level. Donned gown Supervision and declined LB dress for scrub pants because they are "itchy." 2x15 knee flexion/extension sitting EOB with mild overpressure to her tolerance both PROM and AROM. Shoulder rolls 1x20 to promote open shoulders and good posture with verbal cues for form. Able to sit EOB approx 45 minutes during session for various activities. Extensive discussion regarding DC planning as well for home doorway measurements, assist at home, etc. Sit to supine Mod A and scoot up in bed with boost feature. Call bell in reach all needs met.   Session 2: Pt greeted at time of session supine in bed resting agreeable to OT session, finishing up dressing changes with nursing staff. Supine to sit Mod A, slide board transfer after placement with 2 helpers bed >  w/c and transported to day room for therapeutic activity for christmas singing to uplift mood, increase time OOB and upright, and perform dynamic seated activity of making an ornament. Transported back to room in same manner and slide board transfer w/c > bed total A with cues for forward weight shifting and hand/foot placement. Supine in bed resting all needs met.   Therapy Documentation Precautions:   Precautions Precautions: Fall,Other (comment) Precaution Comments: Unrestricted ROM B knees, L ankle, B hips; PROM/AROM R wrist; colostomy; abdominal wound Required Braces or Orthoses: Splint/Cast Splint/Cast: RUE wrist splint on when mobilizing Restrictions Weight Bearing Restrictions: No RUE Weight Bearing: Weight bearing as tolerated LUE Weight Bearing: Partial weight bearing LUE Partial Weight Bearing Percentage or Pounds: 50 RLE Weight Bearing: Weight bearing as tolerated LLE Weight Bearing: Partial weight bearing LLE Partial Weight Bearing Percentage or Pounds: 50    Therapy/Group: Individual Therapy  Viona Gilmore 12/22/2020, 8:40 AM

## 2020-12-22 NOTE — Progress Notes (Signed)
Per order, sutures removed from LLE at surgical site. Total of 6 sutures removed. Pt tolerated well. Site is warm and dry and without drainage or redness. Dry dressing placed.
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
## 2020-12-22 NOTE — Progress Notes (Signed)
Physical Therapy Weekly Progress Note  Patient Details  Name: Sara [name] MRN: [id number] Date of Birth: 26-Apr-1966  Beginning of progress report period: December 15, 2020 End of progress report period: December 22, 2020  Today's Date: 12/22/2020 PT Individual Time: 1430-1500 PT Individual Time Calculation (min): 30 min   Patient has met 3 of 4 short term goals.  Patient progressing this week improved with bed mobility, supine to sit and bed to chair transfers even initiating standing transfers using lift equipment.  Still limited to 2 hours tolerated for OOB time, but progressing.  Progress limited due to nausea and even vomiting during therapy, dizziness with likely positional vestibular component and pain. She will continue to benefit from skilled PT to progress toward new goals as noted below.   Patient continues to demonstrate the following deficits muscle weakness and muscle joint tightness and decreased coordination and dizziness and therefore will continue to benefit from skilled PT intervention to increase functional independence with mobility.  Patient progressing toward long term goals..  Continue plan of care.  PT Short Term Goals Week 1:  PT Short Term Goal 1 (Week 1): Pt will perform supine<>sit with max assist of 1 PT Short Term Goal 1 - Progress (Week 1): Met PT Short Term Goal 2 (Week 1): Pt will perform bed<>chair transfers using LRAD with max assist of 1 and +2 min assist PT Short Term Goal 2 - Progress (Week 1): Met PT Short Term Goal 3 (Week 1): Pt will perform sit<>stands using equipment as needed with +2 max assist PT Short Term Goal 3 - Progress (Week 1): Met PT Short Term Goal 4 (Week 1): Pt will tolerate sitting upright, OOB in TIS w/c for at least 3 total hours a day PT Short Term Goal 4 - Progress (Week 1): Progressing toward goal Week 2:  PT Short Term Goal 1 (Week 2): Patient will perform bed <>chair transfers with mod A of 1 PT Short Term Goal 2 (Week  2): Patient will initiate gait with LRAD as tolerated and +2 A for safety PT Short Term Goal 3 (Week 2): Patient to transition to w/c able to propel with min A x 100' PT Short Term Goal 4 (Week 2): Paient sit to stand max A of 1 to appropriate AD.  Skilled Therapeutic Interventions/Progress Updates:  Ambulation/gait training;Balance/vestibular training;DME/adaptive equipment instruction;Functional electrical stimulation;Functional mobility training;Neuromuscular re-education;Pain management;Patient/family education;Psychosocial support;Skin care/wound management;Splinting/orthotics;Stair training;Therapeutic Activities;Therapeutic Exercise;UE/LE Strength taining/ROM;Wheelchair propulsion/positioning   Patient in supine reports up earlier with OT using slide board with best transfer yet.  States still with nausea but improved.  Noted ostomy bag full so emptied and NT informed to record output.  Patient supine to sit mod A and cues for using rail and moving legs toward EOB.  Patient seated EOB initially with CGA due to reported dizziness then with S.  Placed board for transfer with +2 A for safety with lateral leaning.  PAtient performed slide board transfer with mod A (+2 for safety) to w/c with increased time and mod cues for technique, encouragement.  PAtient able to scoot back in chair with increased time and using R LE on legrest.  Slide board back to w/c with mod A (+2 to scoot back on bed once over on board, and for safety).  Patient sit to supine mod A for LE's and to remove elbow once supine.  Discussed progress and plan for next week.  Patient left in supine with needs in reach.   Therapy Documentation Precautions:  Precautions Precautions: Fall,Other (comment) Precaution Comments: Unrestricted ROM B knees, L ankle, B hips; PROM/AROM R wrist; colostomy; abdominal wound Required Braces or Orthoses: Splint/Cast Splint/Cast: RUE wrist splint on when mobilizing Restrictions Weight Bearing  Restrictions: Yes RUE Weight Bearing: Weight bearing as tolerated LUE Weight Bearing: Partial weight bearing LUE Partial Weight Bearing Percentage or Pounds: 50 RLE Weight Bearing: Weight bearing as tolerated LLE Weight Bearing: Partial weight bearing LLE Partial Weight Bearing Percentage or Pounds: 50 Pain: Pain Assessment Pain Score: 7  Pain Type: Acute pain Pain Location: Leg Pain Orientation: Left Pain Descriptors / Indicators: Aching Pain Onset: On-going Pain Intervention(s): Repositioned   Therapy/Group: Individual Therapy  Reginia Naas  [place], [place] 12/22/2020, 3:21 PM

## 2020-12-22 NOTE — Consult Note (Signed)
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  Packed with Aquacel Ag.   Stomal assessment/size: 1 1/4"  Os points downward. Peristomal breakdown at distal edge, located within a surgical scar.  Covered with aquacel Ag and barrier ring to promote seal.  Cut pouch off center  Peristomal assessment: see above.  MC separation and peristomal skin breakdown in old surgical site.  Treatment options for stomal/peristomal skin: barrier ring and aquacel Ag Output soft brown stool Ostomy pouching: 2 pc. 2 3/4" pouch with aquacel ag and barrier ring.   Education provided: none today.  Enrolled patient in [facility] Secure Start Discharge program: Yes Will follow.  Maple Hudson MSN, RN, FNP-BC CWON Wound, Ostomy, Continence Nurse Pager [phone]

## 2020-12-23 ENCOUNTER — Inpatient Hospital Stay (HOSPITAL_COMMUNITY): Payer: Medicaid Other | Admitting: Occupational Therapy

## 2020-12-23 DIAGNOSIS — G7281 Critical illness myopathy: Secondary | ICD-10-CM | POA: Diagnosis not present
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
## 2020-12-23 NOTE — Progress Notes (Signed)
Patient refused bladder scan this AM, no s/s distress or distention or reports of discomfort.

## 2020-12-23 NOTE — Plan of Care (Signed)
°  Problem: Consults Goal: RH GENERAL PATIENT EDUCATION Description: See Patient Education module for education specifics. Outcome: Progressing Goal: Skin Care Protocol Initiated - if Braden Score 18 or less Description: If consults are not indicated, leave blank or document N/A Outcome: Progressing Goal: Nutrition Consult-if indicated Outcome: Progressing   Problem: RH BOWEL ELIMINATION Goal: RH STG MANAGE BOWEL WITH ASSISTANCE Description: STG Manage Bowel with min Assistance. Outcome: Progressing   Problem: RH SKIN INTEGRITY Goal: RH STG SKIN FREE OF INFECTION/BREAKDOWN Description: Skin to remain free from breakdown and infection with min assist while on rehab. Outcome: Progressing Goal: RH STG MAINTAIN SKIN INTEGRITY WITH ASSISTANCE Description: STG Maintain Skin Integrity With min Assistance. Outcome: Progressing Goal: RH STG ABLE TO PERFORM INCISION/WOUND CARE W/ASSISTANCE Description: STG Able To Perform Incision/Wound Care With min Assistance. Outcome: Progressing   Problem: RH SAFETY Goal: RH STG ADHERE TO SAFETY PRECAUTIONS W/ASSISTANCE/DEVICE Description: STG Adhere to Safety Precautions With min Assistance/Device. Outcome: Progressing Goal: RH STG DECREASED RISK OF FALL WITH ASSISTANCE Description: STG Decreased Risk of Fall With min Assistance. Outcome: Progressing   Problem: RH PAIN MANAGEMENT Goal: RH STG PAIN MANAGED AT OR BELOW PT'S PAIN GOAL Description: <4 on a 0-10 pain scale Outcome: Progressing   Problem: RH KNOWLEDGE DEFICIT GENERAL Goal: RH STG INCREASE KNOWLEDGE OF [facility] CARE AFTER HOSPITALIZATION Description: Patient will demonstrate knowledge of medication management, nutritional supplement, ostomy management, weight bearing precautions with educational materials and handouts with min assist from staff. Outcome: Progressing   

## 2020-12-24 ENCOUNTER — Inpatient Hospital Stay (HOSPITAL_COMMUNITY): Payer: Medicaid Other | Admitting: Occupational Therapy

## 2020-12-24 ENCOUNTER — Inpatient Hospital Stay (HOSPITAL_COMMUNITY): Payer: Medicaid Other

## 2020-12-24 DIAGNOSIS — G7281 Critical illness myopathy: Secondary | ICD-10-CM | POA: Diagnosis not present

## 2020-12-24 NOTE — Progress Notes (Signed)
Patient refused AM med pass, wound care, in/out cath

## 2020-12-24 NOTE — Progress Notes (Signed)
Physical Therapy Session Note  Patient Details  Name: Sara [name] MRN: [id number] Date of Birth: 02-07-1966  Today's Date: 12/24/2020 PT Individual Time: 0907-1005 PT Individual Time Calculation (min): 58 min   Short Term Goals: Week 2:  PT Short Term Goal 1 (Week 2): Patient will perform bed <>chair transfers with mod A of 1 PT Short Term Goal 2 (Week 2): Patient will initiate gait with LRAD as tolerated and +2 A for safety PT Short Term Goal 3 (Week 2): Patient to transition to w/c able to propel with min A x 100' PT Short Term Goal 4 (Week 2): Paient sit to stand max A of 1 to appropriate AD.  Skilled Therapeutic Interventions/Progress Updates:     Patient in bed upon PT arrival. Patient alert and agreeable to PT session. Patient reported 7-8/10 B lower extremity pain L>R during session, RN made aware and provided pain medication during session. PT provided repositioning, rest breaks, and distraction as pain interventions throughout session.   Colostomy bag with 200 mL, emptied with total A, demonstration proper technique and educated patient throughout. Patient reports that the bag is in a awkward position for her to empty it. Problem solved ways for patient to handle the bag with it sealed to promote increased independence with colostomy care.   Offered to progress with standing OOB using Stedy. Patient reported increased pain today without pain medication on board piror to session. Agreeable to work on standing tolerance using the Kregg bed. Applied straps at blow the patient's knees, over her hips, and over her chest. Elevated to 20 deg and assisted patient to slide down to the foot board. Elevated to 50 deg and patient reported increased pain, but tolerable. At 50 deg, patient tolerated >7 min, performed quad and glut sets 2x10, attempted mini squats, but patient unable to tolerate at this time, worked on diaphragmatic breathing for pain management, and educated patient on her  injuries, as she stated she did not know what injuries she had, only where the pain was located.   Once returned to lying, performed AAROM knee flexion/extension in supine, patient very limited bilaterally. L ~10-15 deg knee flexion, limited by pain and muscle guarding, R ~30-40 deg knee flexion, improved with increased hip flexion.   Patient in bed at end of session with breaks locked and all needs within reach.    Therapy Documentation Precautions:  Precautions Precautions: Fall,Other (comment) Precaution Comments: Unrestricted ROM B knees, L ankle, B hips; PROM/AROM R wrist; colostomy; abdominal wound Required Braces or Orthoses: Splint/Cast Splint/Cast: RUE wrist splint on when mobilizing Restrictions Weight Bearing Restrictions: Yes RUE Weight Bearing: Weight bearing as tolerated LUE Weight Bearing: Partial weight bearing LUE Partial Weight Bearing Percentage or Pounds: 50 RLE Weight Bearing: Weight bearing as tolerated LLE Weight Bearing: Partial weight bearing LLE Partial Weight Bearing Percentage or Pounds: 50   Therapy/Group: Individual Therapy  Ericha Whittingham L Foy Mungia PT, DPT  12/24/2020, 12:19 PM

## 2020-12-24 NOTE — Plan of Care (Signed)
°  Problem: Consults Goal: RH GENERAL PATIENT EDUCATION Description: See Patient Education module for education specifics. Outcome: Progressing Goal: Skin Care Protocol Initiated - if Braden Score 18 or less Description: If consults are not indicated, leave blank or document N/A Outcome: Progressing Goal: Nutrition Consult-if indicated Outcome: Progressing   Problem: RH SKIN INTEGRITY Goal: RH STG SKIN FREE OF INFECTION/BREAKDOWN Description: Skin to remain free from breakdown and infection with min assist while on rehab. Outcome: Progressing Goal: RH STG MAINTAIN SKIN INTEGRITY WITH ASSISTANCE Description: STG Maintain Skin Integrity With min Assistance. Outcome: Progressing Goal: RH STG ABLE TO PERFORM INCISION/WOUND CARE W/ASSISTANCE Description: STG Able To Perform Incision/Wound Care With min Assistance. Outcome: Progressing   Problem: RH SAFETY Goal: RH STG ADHERE TO SAFETY PRECAUTIONS W/ASSISTANCE/DEVICE Description: STG Adhere to Safety Precautions With min Assistance/Device. Outcome: Progressing Goal: RH STG DECREASED RISK OF FALL WITH ASSISTANCE Description: STG Decreased Risk of Fall With min Assistance. Outcome: Progressing   Problem: RH PAIN MANAGEMENT Goal: RH STG PAIN MANAGED AT OR BELOW PT'S PAIN GOAL Description: <4 on a 0-10 pain scale Outcome: Progressing   Problem: RH KNOWLEDGE DEFICIT GENERAL Goal: RH STG INCREASE KNOWLEDGE OF [place] CARE AFTER HOSPITALIZATION Description: Patient will demonstrate knowledge of medication management, nutritional supplement, ostomy management, weight bearing precautions with educational materials and handouts with min assist from staff. Outcome: Progressing   Problem: RH BOWEL ELIMINATION Goal: RH STG MANAGE BOWEL WITH ASSISTANCE Description: STG Manage Bowel with min Assistance. Outcome: Not Progressing
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
## 2020-12-24 NOTE — Progress Notes (Signed)
Occupational Therapy Session Note  Patient Details  Name: Sara [name] MRN: [id number] Date of Birth: 03/16/1966  Today's Date: 12/24/2020 OT Individual Time: 1445-1545 OT Individual Time Calculation (min): 60 min    Short Term Goals: Week 1:  OT Short Term Goal 1 (Week 1): Pt will complete UB dressing with min assist OT Short Term Goal 1 - Progress (Week 1): Met OT Short Term Goal 2 (Week 1): pt will complete bathing with mod assist of one caregiver OT Short Term Goal 2 - Progress (Week 1): Met OT Short Term Goal 3 (Week 1): Pt will complete LB dressing mod assist of one caregiver OT Short Term Goal 3 - Progress (Week 1): Progressing toward goal OT Short Term Goal 4 (Week 1): Pt will complete toilet transfer with mod assist of one caregiver OT Short Term Goal 4 - Progress (Week 1): Progressing toward goal Week 2:  OT Short Term Goal 1 (Week 2): Pt will perform toilet transfer with Max A of 1 OT Short Term Goal 2 (Week 2): Pt will perform sit to stand with no more than Max A of 1 OT Short Term Goal 3 (Week 2): Pt will perform LB dress with Mod A OT Short Term Goal 4 (Week 2): Pt will perform bathing with Min A and use of AE PRN    Skilled Therapeutic Interventions/Progress Updates:    Pt received in bed, therapy delayed as pt needed to be cathed by nursing, but was able to get full treatment session in. Worked on rolling in bed to change brief as ostomy back leaked on brief. +2 A to safely roll pt to avoid inducing pain in B knees.  Pt set up in kraig bed to work on standing. It took awhile to get bed functions started.  The finally started working, pt set up at 45 degree angle and tolerated for 5-6 minutes before her knees began to hurt. Pt rated a 7/10 pain.   With bed elevated and with bed in regular position with HOB elevated , pt worked on UE AROM exercises.   LE AROM with gentle in/out ext/int rotation, knee flex and ext, and heel flex/plantar flex. Pt tolerated  well.  Resting in bed with all needs met.   Therapy Documentation Precautions:  Precautions Precautions: Fall,Other (comment) Precaution Comments: Unrestricted ROM B knees, L ankle, B hips; PROM/AROM R wrist; colostomy; abdominal wound Required Braces or Orthoses: Splint/Cast Splint/Cast: RUE wrist splint on when mobilizing Restrictions Weight Bearing Restrictions: Yes RUE Weight Bearing: Weight bearing as tolerated LUE Weight Bearing: Partial weight bearing LUE Partial Weight Bearing Percentage or Pounds: 50 RLE Weight Bearing: Weight bearing as tolerated LLE Weight Bearing: Partial weight bearing LLE Partial Weight Bearing Percentage or Pounds: 50    Vital Signs: Therapy Vitals Temp: 98.5 F (36.9 C) Temp Source: Oral Pulse Rate: 98 Resp: 19 BP: 113/66 Patient Position (if appropriate): Lying Oxygen Therapy SpO2: 98 % O2 Device: Room Air       Therapy/Group: Individual Therapy  Carman Essick 12/24/2020, 3:57 PM

## 2020-12-25 ENCOUNTER — Inpatient Hospital Stay (HOSPITAL_COMMUNITY): Payer: Medicaid Other

## 2020-12-25 ENCOUNTER — Encounter (HOSPITAL_COMMUNITY): Payer: Medicaid Other | Admitting: Psychology

## 2020-12-25 ENCOUNTER — Inpatient Hospital Stay (HOSPITAL_COMMUNITY): Payer: Medicaid Other | Admitting: Occupational Therapy

## 2020-12-25 DIAGNOSIS — R339 Retention of urine, unspecified: Secondary | ICD-10-CM | POA: Diagnosis not present

## 2020-12-25 DIAGNOSIS — G8918 Other acute postprocedural pain: Secondary | ICD-10-CM

## 2020-12-25 DIAGNOSIS — G7281 Critical illness myopathy: Secondary | ICD-10-CM | POA: Diagnosis not present

## 2020-12-25 DIAGNOSIS — E876 Hypokalemia: Secondary | ICD-10-CM

## 2020-12-25 DIAGNOSIS — T07XXXA Unspecified multiple injuries, initial encounter: Secondary | ICD-10-CM | POA: Diagnosis not present

## 2020-12-25 LAB — CBC
HCT: 32.2 % — ABNORMAL LOW (ref 36.0–46.0)
Hemoglobin: 10 g/dL — ABNORMAL LOW (ref 12.0–15.0)
MCH: 29.9 pg (ref 26.0–34.0)
MCHC: 31.1 g/dL (ref 30.0–36.0)
MCV: 96.4 fL (ref 80.0–100.0)
Platelets: 363 10*3/uL (ref 150–400)
RBC: 3.34 MIL/uL — ABNORMAL LOW (ref 3.87–5.11)
RDW: 14.3 % (ref 11.5–15.5)
WBC: 7 10*3/uL (ref 4.0–10.5)
nRBC: 0 % (ref 0.0–0.2)

## 2020-12-25 LAB — BASIC METABOLIC PANEL
Anion gap: 10 (ref 5–15)
BUN: 13 mg/dL (ref 6–20)
CO2: 23 mmol/L (ref 22–32)
Calcium: 9.5 mg/dL (ref 8.9–10.3)
Chloride: 106 mmol/L (ref 98–111)
Creatinine, Ser: 0.66 mg/dL (ref 0.44–1.00)
GFR, Estimated: >60 mL/min (ref >60)
Glucose, Bld: 88 mg/dL (ref 70–99)
Potassium: 4.5 mmol/L (ref 3.5–5.1)
Sodium: 139 mmol/L (ref 135–145)

## 2020-12-25 MED ORDER — TAMSULOSIN HCL 0.4 MG PO CAPS
0.8000 mg | ORAL_CAPSULE | Freq: Every day | ORAL | Status: DC
  Administered 2020-12-25 – 2021-01-16 (×23): 0.8 mg via ORAL
  Filled 2020-12-25 (×24): qty 2

## 2020-12-25 NOTE — Progress Notes (Signed)
Physical Therapy Session Note  Patient Details  Name: Sara [name] MRN: [id number] Date of Birth: 12-Nov-1966  Today's Date: 12/25/2020 PT Individual Time: 1300-1400 PT Individual Time Calculation (min): 60 min   Short Term Goals: Week 2:  PT Short Term Goal 1 (Week 2): Patient will perform bed <>chair transfers with mod A of 1 PT Short Term Goal 2 (Week 2): Patient will initiate gait with LRAD as tolerated and +2 A for safety PT Short Term Goal 3 (Week 2): Patient to transition to w/c able to propel with min A x 100' PT Short Term Goal 4 (Week 2): Paient sit to stand max A of 1 to appropriate AD.  Skilled Therapeutic Interventions/Progress Updates:    Patient in supine, reports feeling more pain in both legs today as well as achy all over.  Reports had medication.  Performed supine therex AAROM for hip flexion, knee flexion ankle DF/PF with passive stretch into DF on L as well as for knee flexion bilateral.  Emptied ostomy bag as full of air and stool.  Patient refused standing or Stedy use. Not really agreeable to slide board transfers, but encouraged and participated anyway.  Supine to sit with mod A for L LE and some assist to lift trunk.  Patient using step under feet off Kreg tilt bed for support for LE's and increased time once upright due to c/o dizziness.  Performed slide board transfer to w/c with +2 mod/max A board placement, cues for head/hips relationship and assist to scoot over to w/c.  Patient in tilt in space w/c pushed to dayroom.  Performed 2 x 2 minutes on Kinetron at 80 cm/sec for LE strength and ROM.  Measured knee AAROM L 63 and R 73.  Patient assisted to room.  Performed 10 reps trunk flexion/abdominal activation while in w/c.  Patient performed slide board transfer to bed with +2 mod/max A.  Sit to supine mod A for LE's onto bed, pt assisted to scoot up in bed with bed in trendelenberg, R knee flexion and pulling on rails.  Patient positioned for comfort and left with all  needs in reach.  NT aware of output from ostomy.    Therapy Documentation Precautions:  Precautions Precautions: Fall,Other (comment) Precaution Comments: Unrestricted ROM B knees, L ankle, B hips; PROM/AROM R wrist; colostomy; abdominal wound Required Braces or Orthoses: Splint/Cast Splint/Cast: RUE wrist splint on when mobilizing Restrictions Weight Bearing Restrictions: Yes RUE Weight Bearing: Weight bearing as tolerated LUE Weight Bearing: Weight bearing as tolerated LUE Partial Weight Bearing Percentage or Pounds: 50 RLE Weight Bearing: Weight bearing as tolerated LLE Weight Bearing: Partial weight bearing LLE Partial Weight Bearing Percentage or Pounds: 50 Pain: Pain Assessment Pain Scale: 0-10 Pain Score: 8  Pain Type: Acute pain Pain Location: Leg Pain Orientation: Right;Left Pain Descriptors / Indicators: Aching;Throbbing Pain Onset: On-going Patients Stated Pain Goal: 4 Pain Intervention(s): Rest;Distraction;Repositioned    Therapy/Group: Individual Therapy  [name]  [place], [place] 12/25/2020, 4:02 PM
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
## 2020-12-25 NOTE — Progress Notes (Signed)
Patient refused last med pass
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
## 2020-12-25 NOTE — Progress Notes (Signed)
Physical Therapy Session Note  Patient Details  Name: Sara [name] MRN: [id number] Date of Birth: 10-24-1966  Today's Date: 12/25/2020 PT Individual Time: 1500-1525 PT Individual Time Calculation (min): 25 min   Short Term Goals: Week 1:  PT Short Term Goal 1 (Week 1): Pt will perform supine<>sit with max assist of 1 PT Short Term Goal 1 - Progress (Week 1): Met PT Short Term Goal 2 (Week 1): Pt will perform bed<>chair transfers using LRAD with max assist of 1 and +2 min assist PT Short Term Goal 2 - Progress (Week 1): Met PT Short Term Goal 3 (Week 1): Pt will perform sit<>stands using equipment as needed with +2 max assist PT Short Term Goal 3 - Progress (Week 1): Met PT Short Term Goal 4 (Week 1): Pt will tolerate sitting upright, OOB in TIS w/c for at least 3 total hours a day PT Short Term Goal 4 - Progress (Week 1): Progressing toward goal Week 2:  PT Short Term Goal 1 (Week 2): Patient will perform bed <>chair transfers with mod A of 1 PT Short Term Goal 2 (Week 2): Patient will initiate gait with LRAD as tolerated and +2 A for safety PT Short Term Goal 3 (Week 2): Patient to transition to w/c able to propel with min A x 100' PT Short Term Goal 4 (Week 2): Paient sit to stand max A of 1 to appropriate AD.  Skilled Therapeutic Interventions/Progress Updates:   Received pt supine in bed with blanket pulled up over her face and reported pain 8/10 in BLEs and throughout entire body. RN made aware and present to administer medication during session. Pt politely declined OOB mobility due to pain but was agreeable to bed level exercisers. Pt performed the following exercises supine in bed with supervision and verbal cues for technique: -ankle circles x15 bilaterally with heels elevated  -heel slides x10 bilaterally (decreased ROM on LLE > RLE) -hip abduction x10 bilaterally (AAROM on LLE) -SLR x10 bilaterally (AAROM) Concluded session with pt supine in bed, needs within reach, and bed  alarm on. Therapist positioned pillows under bilateral LEs for pressure relief.   Therapy Documentation Precautions:  Precautions Precautions: Fall,Other (comment) Precaution Comments: Unrestricted ROM B knees, L ankle, B hips; PROM/AROM R wrist; colostomy; abdominal wound Required Braces or Orthoses: Splint/Cast Splint/Cast: RUE wrist splint on when mobilizing Restrictions Weight Bearing Restrictions: Yes RUE Weight Bearing: Weight bearing as tolerated LUE Weight Bearing: Partial weight bearing LUE Partial Weight Bearing Percentage or Pounds: 50 RLE Weight Bearing: Weight bearing as tolerated LLE Weight Bearing: Partial weight bearing LLE Partial Weight Bearing Percentage or Pounds: 50   Therapy/Group: Individual Therapy Alfonse Alpers PT, DPT   12/25/2020, 7:32 AM

## 2020-12-25 NOTE — Progress Notes (Signed)
Occupational Therapy Session Note  Patient Details  Name: Sara [name] MRN: [id number] Date of Birth: 03/26/1966  Today's Date: 12/25/2020  OT Time Calculation: 1130-1158 (2nd session)  OT total time: 28 min OT Missed Time: 60 Minutes  Missed Time Reason: Patient fatigue;Patient ill (comment) in first session.    Short Term Goals: Week 2:  OT Short Term Goal 1 (Week 2): Pt will perform toilet transfer with Max A of 1 OT Short Term Goal 2 (Week 2): Pt will perform sit to stand with no more than Max A of 1 OT Short Term Goal 3 (Week 2): Pt will perform LB dress with Mod A OT Short Term Goal 4 (Week 2): Pt will perform bathing with Min A and use of AE PRN  Skilled Therapeutic Interventions/Progress Updates:    Pt greeted at time of session supine in bed resting with covers over her head and sleeping but woken with verbal stimuli. Pt stating she felt nauseous and body aches all over and that she felt too bad to participate in OT session this am. Note RN note says pt declined med pass this am. Demonstrated plan to wash hair in sink for comfort with board and pt declined saying she would try later today, also declined bed level ADL, changing gown, etc. OT returned approx 20-30 minutes later to attempt again, pt continued to decline OT session. Missed 60 minutes of OT.  Session 2: Pt greeted at time of session supine in bed resting but agreeable to OT session, feeling slightly better now than in am session. Preparatory activity for knee flexion/extension at bed level with minimal movement noted, guarding present and limited ROM despite having the pt perform her own AROM. Supine <> sit Mod A with trying to maneuver LLE in a manner to prevent "twisting" as the pt puts it and feels uncomfortable. Pt performed grooming tasks sitting EOB for approx 15-20 minutes sitting unsupported to get out knots. Assisted with braiding d/t difficulty reaching back. With feet on built up block, performed 1x15 forward  flexion with trying to promote weight bearing and precursor skills for lifting buttocks for transfers. Sit to supine Mod, call bell in reach all needs met.   Therapy Documentation Precautions:  Precautions Precautions: Fall,Other (comment) Precaution Comments: Unrestricted ROM B knees, L ankle, B hips; PROM/AROM R wrist; colostomy; abdominal wound Required Braces or Orthoses: Splint/Cast Splint/Cast: RUE wrist splint on when mobilizing Restrictions Weight Bearing Restrictions: Yes RUE Weight Bearing: Weight bearing as tolerated LUE Weight Bearing: Partial weight bearing LUE Partial Weight Bearing Percentage or Pounds: 50 RLE Weight Bearing: Weight bearing as tolerated LLE Weight Bearing: Partial weight bearing LLE Partial Weight Bearing Percentage or Pounds: 50      Therapy/Group: Individual Therapy  Hannah C Spach 12/25/2020, 7:49 AM  

## 2020-12-26 ENCOUNTER — Inpatient Hospital Stay (HOSPITAL_COMMUNITY): Payer: Medicaid Other | Admitting: Occupational Therapy

## 2020-12-26 ENCOUNTER — Inpatient Hospital Stay (HOSPITAL_COMMUNITY): Payer: Medicaid Other | Admitting: Physical Therapy

## 2020-12-26 ENCOUNTER — Inpatient Hospital Stay (HOSPITAL_COMMUNITY): Payer: Medicaid Other

## 2020-12-26 DIAGNOSIS — G7281 Critical illness myopathy: Secondary | ICD-10-CM | POA: Diagnosis not present

## 2020-12-26 MED ORDER — GABAPENTIN 300 MG PO CAPS
300.0000 mg | ORAL_CAPSULE | Freq: Every day | ORAL | Status: DC
  Administered 2020-12-26 – 2020-12-29 (×4): 300 mg via ORAL
  Filled 2020-12-26 (×4): qty 1
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
## 2020-12-26 NOTE — Progress Notes (Signed)
Occupational Therapy Session Note  Patient Details  Name: Sara [name] MRN: [id number] Date of Birth: 1966-11-29  Today's Date: 12/26/2020 OT Individual Time: 4037-5436 OT Individual Time Calculation (min): 70 min    Short Term Goals: Week 2:  OT Short Term Goal 1 (Week 2): Pt will perform toilet transfer with Max A of 1 OT Short Term Goal 2 (Week 2): Pt will perform sit to stand with no more than Max A of 1 OT Short Term Goal 3 (Week 2): Pt will perform LB dress with Mod A OT Short Term Goal 4 (Week 2): Pt will perform bathing with Min A and use of AE PRN  Skilled Therapeutic Interventions/Progress Updates:    Pt greeted at time of session supine in bed resting, c/o body aches and fatigue but agreeable to OT session with encouragement. Bed level brief change with rolling L/R with Mod A overall and dependent brief change. Supine to sit Mod A and lateral leans L/R to place chuck pad under buttocks for slide board with total A for placement with pt performing lateral leans onto elbow. Total A board placement, Mod A x2 for slide board <> w/c. Set up at sink level and performed UB/LB bathing declining washing periarea and buttocks with Min A but pt would need increased assist for buttocks at bed level. Don/doff new gown with supervision. Assisted with hair washing at wheelchair level in reclined position, pt assisting with drying and grooming tasks after hair washed and therapist assist with braid. Slide board back to bed same as above and sit to supine Mod A. Scoot up in bed using rails and RLE Mod/Max and positioned for comfort, call bell in reach all needs met.   Therapy Documentation Precautions:  Precautions Precautions: Fall,Other (comment) Precaution Comments: Unrestricted ROM B knees, L ankle, B hips; PROM/AROM R wrist; colostomy; abdominal wound Required Braces or Orthoses: Splint/Cast Splint/Cast: RUE wrist splint on when mobilizing Restrictions Weight Bearing Restrictions:  Yes RUE Weight Bearing: Weight bearing as tolerated LUE Weight Bearing: Weight bearing as tolerated LUE Partial Weight Bearing Percentage or Pounds: 50 RLE Weight Bearing: Weight bearing as tolerated LLE Weight Bearing: Partial weight bearing LLE Partial Weight Bearing Percentage or Pounds: 50     Therapy/Group: Individual Therapy  Viona Gilmore 12/26/2020, 11:29 AM

## 2020-12-26 NOTE — Consult Note (Signed)
WOC Nurse ostomy follow up Patient receiving care in [facility] [phone].  Patient alert and participated in ostomy care today. Stoma type/location: LUQ colostomy Stomal assessment/size: slightly oval shape, 1 inch - 1 1/4; moist, slightly budded  Peristomal assessment: MCS pocket on the patient's left side of the stoma from 4 - 5 o'clock is now 2/8 cm deep. Pocket Aquacel packing removed, new segment of Aquacel placed. Barrier ring applied over it and around the stoma. Treatment options for stomal/peristomal skin: barrier ring Output: slightly thin yellow stool  Ostomy pouching: 2pc. Flat, [place] [phone] for pouch, [place] #2 for skin barrier; [place] # [phone] for barrier ring; [place] [phone] for Aquacel There is a 1 cm x 4 cm superficial wound below the stoma that is 100% pink and clean. This was covered with a strip of Aquacel and a barrier ring.  The area is healing. There is no odor. Education provided:  Patient participated in opening/closing a pouch, removing the existing pouch, and observing all steps of packing the MCS pocket, placement of barrier rings, and application of new pouching system.  She will talk with her family to find out who is going to come to the hospital to learn ostomy care to assist when she goes home. Enrolled patient in [place] Secure Start Discharge program: Yes, Secure Start form completed and transmitted to [place] today.   As for the abdominal wound, both of the tunnels are healed.  The epithelial tissue continues to close over the wound bed. Continue current topical treatment for this wound. Helmut Muster, RN, MSN, CWOCN, CNS-BC, pager [phone]

## 2020-12-26 NOTE — Progress Notes (Signed)
This nurse and Sara [name],NT has offered to do in/out cath multiple times on the pt. Pts last bladder scan was , and pt still refused. Pt was educated on the importance of emptying the bladder routinely to prevent further infections. Pt continued to refuse in/out cath. Pt also refused to empty ostomy pouch her [place]. Pt educated on the importance of practicing this procedure while staff is at the bed side. Pt continue to refused, leaving staff to empty the ostomy.

## 2020-12-26 NOTE — Progress Notes (Signed)
Physical Therapy Session Note  Patient Details  Name: Sara [name] MRN: [id number] Date of Birth: 06-15-1966  Today's Date: 12/26/2020 PT Individual Time: 1348-1500 PT Individual Time Calculation (min): 72 min   Short Term Goals: Week 2:  PT Short Term Goal 1 (Week 2): Patient will perform bed <>chair transfers with mod A of 1 PT Short Term Goal 2 (Week 2): Patient will initiate gait with LRAD as tolerated and +2 A for safety PT Short Term Goal 3 (Week 2): Patient to transition to w/c able to propel with min A x 100' PT Short Term Goal 4 (Week 2): Paient sit to stand max A of 1 to appropriate AD.  Skilled Therapeutic Interventions/Progress Updates:    Patient in supine reports not wanting to be catheterized.  RN in the room and reports scanned for , discussed attempting BSC.   Obtained drop arm commode from bathroom.  Pt supine to sit mod A.  Slide board transfer to drop arm 3:1 using bed pad to scoot with +2 mod A.  Patient leaning and A for removing brief and bed pad.  Patient seated with increased time, but did void.  Encouraged to try sit to stand with Stedy to transfer from [facility], or at least to place bed pad to allow SBT, but pt unable despite 3 attempts.  Leaning forward on Stedy to tuck bed pad and brief under partway, then assisting to lift one leg then the other for pulling pad forward.  +2 max A for side board transfer from [facility] to bed.  Patient sit to supine mod A for LE's, rolling with min A using rail to fix brief.  Patient assisted to scoot up in bed using rails and bed in trendelenberg.  Patient performed supine therex: ankle pumps x 20, heel slides AAROM x 8 stretching into knee flexion, then AROM x 5 for strengthening, SAQ x 10 w/ 5 sec hold, hip flexion/SLR AAROM x 10, hip abduction AROM/AAROM x 10, hip adductor squeezes with glut set x 10 w / 5 sec hold.  Patient positioned for comfort and left in supine with needs in reach.  RN made aware pt had voided on BSC.   Therapy  Documentation Precautions:  Precautions Precautions: Fall,Other (comment) Precaution Comments: Unrestricted ROM B knees, L ankle, B hips; PROM/AROM R wrist; colostomy; abdominal wound Required Braces or Orthoses: Splint/Cast Splint/Cast: RUE wrist splint on when mobilizing Restrictions Weight Bearing Restrictions: Yes RUE Weight Bearing: Weight bearing as tolerated LUE Weight Bearing: Weight bearing as tolerated RLE Weight Bearing: Weight bearing as tolerated LLE Weight Bearing: Partial weight bearing LLE Partial Weight Bearing Percentage or Pounds: 50 Pain: Pain Assessment Pain Scale: Faces Pain Score: 7  Faces Pain Scale: Hurts whole lot Pain Type: Acute pain Pain Location: Leg Pain Orientation: Left Pain Descriptors / Indicators: Aching;Grimacing;Guarding Pain Frequency: Intermittent Pain Onset: With Activity Patients Stated Pain Goal: 2 Pain Intervention(s): Repositioned;Emotional support;Rest    Therapy/Group: Individual Therapy  [name]  [place], [place] 12/26/2020, 2:07 PM

## 2020-12-26 NOTE — Progress Notes (Signed)
Patient ID: Sara [name], female   DOB: 08-02-1966, 54 y.o.   MRN: [id number] Team Conference Report to Patient/Family  Team Conference discussion was reviewed with the patient and caregiver, including goals, any changes in plan of care and target discharge date.  Patient and caregiver express understanding and are in agreement.  The patient has a target discharge date of 01/17/21.  Daughter and patient informed sw that they will be unable to get ramp at home due to having a ramp torn down and new steps built for patients mother on the side of the house.   Andria Rhein 12/26/2020, 1:18 PM

## 2020-12-26 NOTE — Progress Notes (Signed)
Patient ID: Sara [name], female   DOB: [birth year], 54 y.o.   MRN: [id number]  Daughter will attend family education with WOC tomorrow at 9 AM.  Lavera Guise, [place] [phone]

## 2020-12-26 NOTE — Progress Notes (Signed)
Physical Therapy Session Note  Patient Details  Name: Sara [name] MRN: [id number] Date of Birth: April 18, 1966  Today's Date: 12/26/2020 PT Individual Time: 4098-1191 PT Individual Time Calculation (min): 60 min   Short Term Goals: Week 2:  PT Short Term Goal 1 (Week 2): Patient will perform bed <>chair transfers with mod A of 1 PT Short Term Goal 2 (Week 2): Patient will initiate gait with LRAD as tolerated and +2 A for safety PT Short Term Goal 3 (Week 2): Patient to transition to w/c able to propel with min A x 100' PT Short Term Goal 4 (Week 2): Paient sit to stand max A of 1 to appropriate AD.  Skilled Therapeutic Interventions/Progress Updates:    Pt received supine in bed with blankets over her head stating she is cold and having "body aches." With encouragement and therapist providing mobility options of standing in Kreg bed using tilt feature vs standing with stedy pt agreeable to standing in Kreg bed but declines OOB mobility stating she transferred to toilet earlier today. Therapist educated pt on goal of performing more than 1 transfer OOB each day and to continue progressing sitting OOB in w/c for improved upright activity tolerance if pt planning to ride in her family's car as she will need to be able to tolerate this position. Therapist provided extensive education and motivational questioning with pt reporting her goal is to ambulate upon discharge from CIR - therapist educated pt on importance of progressing to standing in every therapy session to work towards that goal along with sitting in w/c between therapy sessions. Discussed pt's limited B knee flexion ROM also limiting her mobility and importance of continuing to wear CPM machine as this is resulting in increased ROM as stated in PT note yesterday. Pt states "I've just been through so much" and "I just can't tolerate pain any more" - provided emotional support and educated pt on timing her medications with therapy sessions to  allow increased participation. Pt also reporting the pain mostly located in L LE stating the swelling makes her leg feel "really tight" and that it feels as though her femur is "twisting." RN notified and present for medication administration. Donned Kreg bed straps and used tilt feature. Progressed to 44degrees for 7 minutes then reclined to 34degrees for 5 minutes and progressed back up to 50degrees for 3 minutes. While in standing loosened bottom strap to allow knee flexion/extension AROM and had pt perform very slight range mini-squats with pt noted to have pain predominantly upon attempts at knee flexion with very limited ROM - x10 reps total with 5 second hold into extension - cuing for glute and quad activation. Returned to supine and doffed Kreg bed tilt straps. With significant encouragement, pt agreeable for therapist to don CPM machine on L LE - adjusted degrees to 15degrees of extension on machine (as this was full extension for patient based on fit of the machine) and up to 45degrees of flexion - pt agreeable to wear it for at least 30 minutes and RN notified. Pt left supine in bed with needs in reach.   Therapy Documentation Precautions:  Precautions Precautions: Fall,Other (comment) Precaution Comments: Unrestricted ROM B knees, L ankle, B hips; PROM/AROM R wrist; colostomy; abdominal wound Required Braces or Orthoses: Splint/Cast Splint/Cast: RUE wrist splint on when mobilizing Restrictions Weight Bearing Restrictions: Yes RUE Weight Bearing: Weight bearing as tolerated LUE Weight Bearing: Weight bearing as tolerated LUE Partial Weight Bearing Percentage or Pounds: 50 RLE Weight Bearing:  Weight bearing as tolerated LLE Weight Bearing: Partial weight bearing LLE Partial Weight Bearing Percentage or Pounds: 50  Pain: Reports 8/10 "throbbing" pain in L knee stating it feels as though her femur is "twisting." RN notified and present for medication administration.   Therapy/Group:  Individual Therapy  Ginny Forth , PT, DPT, CSRS  12/26/2020, 12:39 PM

## 2020-12-26 NOTE — Progress Notes (Addendum)
This chaplain present for F/U spiritual care. Pt. participating in bedside medical procedure. This chaplain will return at a better time for the Pt. and chaplain.  **1615 Attempted F/U spiritual care. Pt. sleeping. Checked in with Pt. RN-Karla.

## 2020-12-26 NOTE — Patient Care Conference (Signed)
Inpatient RehabilitationTeam Conference and Plan of Care Update Date: 12/26/2020   Time: 11:44 AM    Patient Name: Sara [name]      Medical Record Number: [id number]  Date of Birth: [birth year] Sex: Female         Room/Bed: 4W23C/4W23C-01 Payor Info: Payor: [place] MEDICAID PREPAID HEALTH PLAN / Plan: [place] MEDICAID [facility] / Product Type: *No Product type* /    Admit Date/Time:  12/14/2020  3:55 PM  Primary Diagnosis:  Intensive care (ICU) myopathy  Hospital Problems: Principal Problem:   Intensive care (ICU) myopathy Active Problems:   Debility   Malnutrition of moderate degree   Urinary retention   Hypokalemia   Hypomagnesemia   Multiple trauma   Postoperative pain    Expected Discharge Date: Expected Discharge Date: 01/17/21  Team Members Present: Physician leading conference: Dr. Sula Soda Care Coodinator Present: Kennyth Arnold, RN, BSN, CRRN Nurse Present: Other (comment) Lupita Dawn, RN) PT Present: [name], PT OT Present: Earleen Newport, OT PPS Coordinator present : Edson Snowball, [place]ed, SLP     Current Status/Progress Goal Weekly Team Focus  Bowel/Bladder   Pt incont of bladder. Pt has ostomy  Pt will regain continence of bladder. Pt will learn how to take care of ostomy.  Bladder regimen q2h, Continue ostomy care.   Swallow/Nutrition/ Hydration             ADL's   Mod bed level bathing, rolling max A for brief changes, bed mobility for ADL Mod A, declines LB dressing most days but total A at bed level, slide board transfers Max A  Min overall including LB ADLs and ADL transfers  core strength, slide board transfers, OOB tolerance, LB ADL, lateral leans, participation   Mobility   mod A bed mobility, +2 mod A slide board transfers, has stood with +2 in [place], but refusing to try when having LE pain and aches.  min A transfers except mod A car, mod A ambulation 50'  work to get w/c she can propel, needs to be on regular bed (air overlay?),  participation, upright tolerance, LE AROM   Communication             Safety/Cognition/ Behavioral Observations            Pain   Pt c/o bilateral LLE pain. PRN meds effective.  Pt will not have pain throughout stay.  Assess pain qshift/prn   Skin   pt has multiple surgery related incisions. wounds on back ,and wound near ostomy.  Pt wounds will continue to heal and remain free from infection  dressing changes as ordered.     Discharge Planning:  Discharging to mothers home home with family assistance from dauhgter, mother and sister (daughter arranging 27/7 schedule with family) NO DME, 2 Level home, will remain on 1st 6steps with railings   Team Discussion: Patient has been refusing I&O cathing, complains of 7/10 and only wants Tylenol. WOC present for ostomy teaching. OT reports patient did sink level bathing and dressing today! She is a mod assist for bathing but she was out of the bed and at the sink! She has min assist goals. She is Mcginn-limiting and can be mod to max assist +2. PT report patient is a mod assist for bed mobility, one day this week she did stand in the steady. She has min assist goals at the W/C level. She has 6 steps to enter at her mother's home which she will discharge to when she  leaves CIR. She has a lot of body aches which limits her progress. Patient on target to meet rehab goals: yes, but she requires lots of motivation and encouragement.  *See Care Plan and progress notes for long and short-term goals.   Revisions to Treatment Plan:  Increase endurance Decrease fatigue Ostomy care Wound care Bladder program Pain management  Teaching Needs: Family education Ostomy education I&O cathing education Wound care education  Current Barriers to Discharge: Decreased caregiver support, Medical stability, Home enviroment access/layout, Incontinence, Neurogenic bowel and bladder, Wound care, Lack of/limited family support, Weight, Weight bearing restrictions,  Medication compliance and Behavior  Possible Resolutions to Barriers: Continue current medications, wound care management, weight bearing precautions, provide emotional support to patient and family.     Medical Summary Current Status: intermittent urinary cath, colostomy functioning well.  + neuropathic pain at noc  Barriers to Discharge: Neurogenic Bowel & Bladder;Other (comments);Wound care;Medical stability  Barriers to Discharge Comments: colostomy care and teaching Possible Resolutions to Barriers/Weekly Focus: start and adjust meds for neuropathic pain, cont ICP, bladder training   Continued Need for Acute Rehabilitation Level of Care: The patient requires daily medical management by a physician with specialized training in physical medicine and rehabilitation for the following reasons: Direction of a multidisciplinary physical rehabilitation program to maximize functional independence : Yes Medical management of patient stability for increased activity during participation in an intensive rehabilitation regime.: Yes Analysis of laboratory values and/or radiology reports with any subsequent need for medication adjustment and/or medical intervention. : Yes   I attest that I was present, lead the team conference, and concur with the assessment and plan of the team.   Tennis Must 12/26/2020, 5:59 PM

## 2020-12-26 NOTE — Progress Notes (Signed)
Pt refused lovenox, Ascorbic Acid, and Cholecalciferol. Pt educated on the importance not missing these medications. Pt continued to refuse.

## 2020-12-27 ENCOUNTER — Inpatient Hospital Stay (HOSPITAL_COMMUNITY): Payer: Medicaid Other

## 2020-12-27 ENCOUNTER — Inpatient Hospital Stay (HOSPITAL_COMMUNITY): Payer: Medicaid Other | Admitting: Occupational Therapy

## 2020-12-27 ENCOUNTER — Inpatient Hospital Stay (HOSPITAL_COMMUNITY): Payer: Medicaid Other | Admitting: Physical Therapy

## 2020-12-27 DIAGNOSIS — G7281 Critical illness myopathy: Secondary | ICD-10-CM | POA: Diagnosis not present

## 2020-12-27 NOTE — Progress Notes (Signed)
This nurse and Sara [name], NT bladder scanned the pt Q6h. Each time offering to do in/out cath and/or assist to the toilet. Pt refused, states " I am to tired to get up right now, I want to wait." pt educated on the importance of getting up. Pt continued to refuse.
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
## 2020-12-27 NOTE — Progress Notes (Signed)
Physical Therapy Session Note  Patient Details  Name: Sara [name] MRN: [id number] Date of Birth: 1966/05/25  Today's Date: 12/27/2020 PT Individual Time: 1300-1415 PT Individual Time Calculation (min): 75 min   Short Term Goals: Week 2:  PT Short Term Goal 1 (Week 2): Patient will perform bed <>chair transfers with mod A of 1 PT Short Term Goal 2 (Week 2): Patient will initiate gait with LRAD as tolerated and +2 A for safety PT Short Term Goal 3 (Week 2): Patient to transition to w/c able to propel with min A x 100' PT Short Term Goal 4 (Week 2): Paient sit to stand max A of 1 to appropriate AD.  Skilled Therapeutic Interventions/Progress Updates:     Patient in bed upon PT arrival. Patient alert and agreeable to PT session. Patient reported 7/10 B lower extremity pain during session, RN made aware and provided Tylenol during session. PT provided repositioning, rest breaks, and distraction as pain interventions throughout session.   Patient reported a new skin tear on lower L abdomen and increased edema and discoloration of R dorsum of her foot. Noted very mild edema and indeterminate discoloration on observation. RN made aware and visualized both areas during session to monitor. Applied lotion to B feet due to dry cracked skin and applied B non-skid socks in preparation for mobility.   Therapeutic Activity:  Patient performed supine to/from sit with min-mod A with use of bed rail. Provided verbal cues for bringing her legs off the bed and use of bed rail vs therapist to push up to sitting. Patient sat EOB, initially agreeable to standing in the [place], then stated she stood during an earlier session and stated she would not tolerate it now due to elevated pain levels. Then was agreeable to slide board transfer to the w/c. Once set up for transfer with her feet on 8" step and preparing board placement, patient then declined performing a slide board transfer due to pain.   Agreeable  exercise EOB before transferring. Performed knee flexion/extension AAROM with bed elevated for increased knee flexion range x15 then placed patient in max tolerated flexion and lowered the bed to place her feet back on the step for a prolonged stretch >1 min. Provided scar massage with lotion to healed incision site over lateral L knee for improved mobility and elasticity of the skin to improve ROM. Educated patient on benefits of massage and how to perform herself, patient declined during this session. Patient decline slide board transfers again due to increased L knee pain, but agreeable to CPM placement.   Returned to lying, as above. Performed scooting up in the bed with mod A with bed in trendelenburg and patient pulling up on bed rails. Placed CPM with total A set at 15 deg-45 deg flexion. Machine cut off after a few minutes, found another plug and machine was working again. Positioned patient in the bed with pillow placed to float L heel.  Patient in bed with CPM running, patient agreeable to at least 1 hour in CPM, at end of session with breaks locked and all needs within reach.    Therapy Documentation Precautions:  Precautions Precautions: Fall,Other (comment) Precaution Comments: Unrestricted ROM B knees, L ankle, B hips; PROM/AROM R wrist; colostomy; abdominal wound Required Braces or Orthoses: Splint/Cast Splint/Cast: RUE wrist splint on when mobilizing Restrictions Weight Bearing Restrictions: Yes RUE Weight Bearing: Weight bearing as tolerated LUE Weight Bearing: Weight bearing as tolerated LUE Partial Weight Bearing Percentage or Pounds: 50 RLE  Weight Bearing: Weight bearing as tolerated LLE Weight Bearing: Partial weight bearing LLE Partial Weight Bearing Percentage or Pounds: 50   Therapy/Group: Individual Therapy  Ashlea Dusing L Latarsha Zani PT, DPT  12/27/2020, 9:00 PM

## 2020-12-27 NOTE — Progress Notes (Signed)
This chaplain present for F/U spiritual care. Pt. sleeping. This chaplain recognizes visits need to be earlier in the day.

## 2020-12-27 NOTE — Progress Notes (Signed)
Occupational Therapy Session Note  Patient Details  Name: Sara [name] MRN: [id number] Date of Birth: [birth year]  Today's Date: 12/27/2020 OT Individual Time: 4259-5638 and 7564-3329 OT Individual Time Calculation (min): 69 min and 61 min   Short Term Goals: Week 2:  OT Short Term Goal 1 (Week 2): Pt will perform toilet transfer with Max A of 1 OT Short Term Goal 2 (Week 2): Pt will perform sit to stand with no more than Max A of 1 OT Short Term Goal 3 (Week 2): Pt will perform LB dress with Mod A OT Short Term Goal 4 (Week 2): Pt will perform bathing with Min A and use of AE PRN   Skilled Therapeutic Interventions/Progress Updates:    Pt greeted at time of session supine in bed resting, not feeling well which is this patient's normal, but agreeable to OT session. No pain in resting but did have some discomfort in LLE with movement, no number given but rest breaks PRN. RN performing med pass initially at beginning of session. Bed mobility supine to sitting EOB with Mod A, pt agreeable to try Stedy transfer to [facility] with encouragement as she states using Stedy is uncomfortable but understands importance of trying to stand and less maneuvering associated with Stedy vs slide board. Sit to stand at [facility] A of 2, transferred to [facility] in same manner and pt with (+) void of urine. Sit to stand in same manner and pt in too much discomfort to perform hygiene at that time, Stedy transfer to EOB and sit to supine Mod A. Rolled L/R for dependently donning new brief at bed level. Positioned for comfort with pillows, call bell in reach all needs met.    Session 2: Pt greeted at time of sessoin supine in bed resting with CPM running, removed in order to allow for transfer OOB. Some discomfort noted in session with movement and bed mobility, no number given and rest breaks PRN. Supine to sit Mod A with cues to push up with R elbow from side lying. Once EOB, slide board bed > chair Mod A x2. Transported to  gym dependent in TIS, performed several core/UB strengthening activities including overhead large therapy ball toss to encourage bimanual integration, shoulder strengthening, and core work for 2x10. BUE strengthening and endurance building with 2# dowel hitting medium sized beach ball in various planes/directions for 2x20. Rest breaks in between sets and fatigue noted with pt heavy breathing in between sets. Also provided hand out of [facility] options as pt stated this am that standard drop arm BSC was uncomfortable, but made aware that  Insurance would not cover bariatric version even though it may be more comfortable. Slide board back to bed in same manner as above, call bell in reach all needs met.   Therapy Documentation Precautions:  Precautions Precautions: Fall,Other (comment) Precaution Comments: Unrestricted ROM B knees, L ankle, B hips; PROM/AROM R wrist; colostomy; abdominal wound Required Braces or Orthoses: Splint/Cast Splint/Cast: RUE wrist splint on when mobilizing Restrictions Weight Bearing Restrictions: Yes RUE Weight Bearing: Weight bearing as tolerated LUE Weight Bearing: Weight bearing as tolerated LUE Partial Weight Bearing Percentage or Pounds: 50 RLE Weight Bearing: Weight bearing as tolerated LLE Weight Bearing: Partial weight bearing LLE Partial Weight Bearing Percentage or Pounds: 50     Therapy/Group: Individual Therapy  Viona Gilmore 12/27/2020, 10:07 AM
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
## 2020-12-28 ENCOUNTER — Inpatient Hospital Stay (HOSPITAL_COMMUNITY): Payer: Medicaid Other | Admitting: Physical Therapy

## 2020-12-28 ENCOUNTER — Inpatient Hospital Stay (HOSPITAL_COMMUNITY): Payer: Medicaid Other | Admitting: Occupational Therapy

## 2020-12-28 ENCOUNTER — Inpatient Hospital Stay (HOSPITAL_COMMUNITY): Payer: Medicaid Other

## 2020-12-28 DIAGNOSIS — G7281 Critical illness myopathy: Secondary | ICD-10-CM | POA: Diagnosis not present

## 2020-12-28 LAB — URINALYSIS, ROUTINE W REFLEX MICROSCOPIC
Bilirubin Urine: NEGATIVE
Glucose, UA: NEGATIVE mg/dL
Hgb urine dipstick: NEGATIVE
Ketones, ur: NEGATIVE mg/dL
Nitrite: NEGATIVE
Protein, ur: 30 mg/dL — AB
Specific Gravity, Urine: 1.018 (ref 1.005–1.030)
WBC, UA: >50 WBC/hpf — ABNORMAL HIGH (ref 0–5)
pH: 5 (ref 5.0–8.0)

## 2020-12-28 NOTE — Progress Notes (Signed)
Physical Therapy Session Note  Patient Details  Name: Sara [name] MRN: [id number] Date of Birth: [birth year]  Today's Date: 12/28/2020 PT Individual Time: 1703-1800 PT Individual Time Calculation (min): 57 min   Short Term Goals: Week 2:  PT Short Term Goal 1 (Week 2): Patient will perform bed <>chair transfers with mod A of 1 PT Short Term Goal 2 (Week 2): Patient will initiate gait with LRAD as tolerated and +2 A for safety PT Short Term Goal 3 (Week 2): Patient to transition to w/c able to propel with min A x 100' PT Short Term Goal 4 (Week 2): Paient sit to stand max A of 1 to appropriate AD.  Skilled Therapeutic Interventions/Progress Updates:    Pt received supine in bed with lights off and blankets around her head. Pt reports having increased pain in B LEs after therapies today therefore declining OOB mobility via transfer to w/c or using stedy to stand, but is agreeable to using tilt feature of bed to continue progressing standing tolerance. Donned 3 Kreg Bed straps for safety. Progressed to standing >25degrees for a total of tolerating 57degrees for 7 minutes, 70degrees for 2 minutes, and 60 degrees for 5 minutes - requires tilt backwards between more upright stands due to increased B LE pain with prolonged weightbearing. While in more upright standing had pt performed very small ROM mini-squats working on B LE quad and glute activation as well as increased knee flexion ROM - performed at least 12 reps total with cuing for increased muscle activation. Therapist utilized distraction techniques for pain management and to improve upright tolerance with pt appearing to have improved spirits today. With minimal encouragement, pt agreeable to sit in "chair" position of bed for improved upright posture while eating dinner - had to adjust foot positioning for increased knee extension as pt unable to tolerate prolonged knee flexion. Pt left sitting in chair position with needs in reach,  meal tray set-up, bed alarm on, and RN aware of pt's position.  Therapy Documentation Precautions:  Precautions Precautions: Fall,Other (comment) Precaution Comments: Unrestricted ROM B knees, L ankle, B hips; PROM/AROM R wrist; colostomy; abdominal wound Required Braces or Orthoses: Splint/Cast Splint/Cast: RUE wrist splint on when mobilizing Restrictions Weight Bearing Restrictions: Yes RUE Weight Bearing: Weight bearing as tolerated LUE Weight Bearing: Weight bearing as tolerated LUE Partial Weight Bearing Percentage or Pounds: 50 RLE Weight Bearing: Weight bearing as tolerated LLE Weight Bearing: Partial weight bearing LLE Partial Weight Bearing Percentage or Pounds: 50  Pain: Reports B LE pain (L>R), unrated, but no medications due at this time - provided emotional support, distraction, and relaxation techniques for pain management.   Therapy/Group: Individual Therapy  Ginny Forth , PT, DPT, CSRS  12/28/2020, 12:26 PM

## 2020-12-28 NOTE — Progress Notes (Signed)
Pt refused her lab draw this morning, did not provide a reason why.   Pt was bladder scanned 2x (Q6h), but continued to refuse the in/out cath, bed pan and/or transferring to the toilet. Pt unable to provide reasoning for refusal, pt states " I prefer just to wait".  pts abd dressing changed, and tolerated well. Provider will be notified of refusal.

## 2020-12-28 NOTE — Progress Notes (Addendum)
Occupational Therapy Weekly Progress Note  Patient Details  Name: Sara [name] MRN: [id number] Date of Birth: Mar 26, 1966  Beginning of progress report period: December 22, 2020 End of progress report period: December 28, 2020  Today's Date: 12/28/2020 OT Individual Time: 2878-6767 OT Individual Time Calculation (min): 71 min    Patient has met 0 of 4 short term goals.  Pt has improved participation with OT services this week as sessions have not been first thing in the am, pt participating more with later am sessions. Bed mobility in prep for ADL transfers and EOB ADL with Mod A pending pain levels in BLEs. Pt has progressed to performing toileting on [facility] with use of slide board once with PT and twice with [facility] transfer, continues to require 2 helpers with Max-total A to come up to standing at [facility] and bed level brief change. Plan to improve standing tolerance in order to improve LB ADL from [place] level.   Patient continues to demonstrate the following deficits: muscle weakness, decreased cardiorespiratoy endurance, decreased motor planning and decreased sitting balance, decreased standing balance, decreased postural control and decreased balance strategies and therefore will continue to benefit from skilled OT intervention to enhance overall performance with BADL and Reduce care partner burden.  Patient slowly progressing toward LTGs.  Continue plan of care.  OT Short Term Goals Week 2:  OT Short Term Goal 1 (Week 2): Pt will perform toilet transfer with Max A of 1 OT Short Term Goal 1 - Progress (Week 2): Not met OT Short Term Goal 2 (Week 2): Pt will perform sit to stand with no more than Max A of 1 OT Short Term Goal 2 - Progress (Week 2): Progressing toward goal OT Short Term Goal 3 (Week 2): Pt will perform LB dress with Mod A OT Short Term Goal 3 - Progress (Week 2): Progressing toward goal OT Short Term Goal 4 (Week 2): Pt will perform bathing with Min A and use of AE PRN OT  Short Term Goal 4 - Progress (Week 2): Progressing toward goal Week 3:  OT Short Term Goal 1 (Week 3): Pt will perform bathing with Min A and use of AE PRN OT Short Term Goal 2 (Week 3): Pt will perform sit to stand at [facility] with assist of 1 helper OT Short Term Goal 3 (Week 3): Pt will perform an ADL activitiy for 10+ minutes without rest break  Skilled Therapeutic Interventions/Progress Updates:    Pt greeted at time of session supine in bed resting stating she felt sore from yesterday's exercises but agreeable to OT session with encouragement. Per NT, pt having full bladder from scan and needing to void but declining cath, agreeable to try toileting on [facility] with Stedy with again max encouragement. Extended time spent at beginning of session attempting to locate bariatric drop arm for comfort, unable to locate. Pt bed mobility supine to sit EOB Mod A and after Stedy placement with pillows at knees for comfort, sit to stand x2 helpers total A and transferred to [facility] via Stedy. Pt (+) void and able to obtain sample, Stedy back to bed in same manner 2 helpers with significant pain but able to complete stand total A of 2. Sit to supine Mod/Max d/t pain today, no number given. Scoot up in bed Max of 2. Pt performed bed level there ex for BUE strengthening with 2# dowel 2x10 for chest press, overhead raise, FWD and backward circles. All needs met, call bell in reach.  Therapy Documentation Precautions:  Precautions Precautions: Fall,Other (comment) Precaution Comments: Unrestricted ROM B knees, L ankle, B hips; PROM/AROM R wrist; colostomy; abdominal wound Required Braces or Orthoses: Splint/Cast Splint/Cast: RUE wrist splint on when mobilizing Restrictions Weight Bearing Restrictions: Yes RUE Weight Bearing: Weight bearing as tolerated LUE Weight Bearing: Weight bearing as tolerated LUE Partial Weight Bearing Percentage or Pounds: 50 RLE Weight Bearing: Weight bearing as tolerated LLE Weight  Bearing: Partial weight bearing LLE Partial Weight Bearing Percentage or Pounds: 50     Therapy/Group: Individual Therapy  Viona Gilmore 12/28/2020, 7:23 AM

## 2020-12-28 NOTE — Progress Notes (Signed)
Physical Therapy Session Note  Patient Details  Name: Sara [name] MRN: [id number] Date of Birth: Jan 12, 1966  Today's Date: 12/28/2020 PT Individual Time: 1400-1500 PT Individual Time Calculation (min): 60 min   Short Term Goals: Week 2:  PT Short Term Goal 1 (Week 2): Patient will perform bed <>chair transfers with mod A of 1 PT Short Term Goal 2 (Week 2): Patient will initiate gait with LRAD as tolerated and +2 A for safety PT Short Term Goal 3 (Week 2): Patient to transition to w/c able to propel with min A x 100' PT Short Term Goal 4 (Week 2): Paient sit to stand max A of 1 to appropriate AD.  Skilled Therapeutic Interventions/Progress Updates:    Patient in supine reports LE's super painful.  RN made aware and during session brought medication.  Patient supine for therex consisting of AP's, heel slides AAROM, SAQ, SLR (AAROM), hip abduction and adductor/glut sets all x 10.  Supine to sit with mod A increased time and cues for focusing straight ahead due to vertigo symptoms with position changes.  Patient performed slide board bed to regular w/c with mod A of 1 (downhill).  Performed w/c mobility x 70' with S.  Seated scooted to edge of w/c with L foot on 4" step and R foot on floor to toss bean bags to corn hole.  Patient assisted to scoot back in w/c and assisted to room.  Slide board transfer back to bed with +2 max A.  Sit to supine mod A for LE's and pt scooting up to [facility] with +2 min A using rails.  Patient left in supine with call bell and needs in reach.   Therapy Documentation Precautions:  Precautions Precautions: Fall,Other (comment) Precaution Comments: Unrestricted ROM B knees, L ankle, B hips; PROM/AROM R wrist; colostomy; abdominal wound Required Braces or Orthoses: Splint/Cast Splint/Cast: RUE wrist splint on when mobilizing Restrictions Weight Bearing Restrictions: Yes RUE Weight Bearing: Weight bearing as tolerated LUE Weight Bearing: Weight bearing as  tolerated LUE Partial Weight Bearing Percentage or Pounds: 50 RLE Weight Bearing: Weight bearing as tolerated LLE Weight Bearing: Partial weight bearing LLE Partial Weight Bearing Percentage or Pounds: 50 Pain: Pain Assessment Pain Scale: 0-10 Pain Score: 8  Faces Pain Scale: Hurts little more Pain Type: Acute pain Pain Location: Leg Pain Orientation: Right;Left Pain Descriptors / Indicators: Aching;Throbbing Pain Frequency: Intermittent Pain Onset: On-going Patients Stated Pain Goal: 3 Pain Intervention(s): RN made aware;Repositioned Multiple Pain Sites: No    Therapy/Group: Individual Therapy  [name]  Luis Lopez, [place] 12/28/2020, 4:12 PM
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
## 2020-12-28 NOTE — Progress Notes (Addendum)
This chaplain is present for F/U spiritual care. The Pt. is awake and eating slaw during the visit.  The chaplain observed more energy in the Pt. conversation accompanied by an occasional smile.  The chaplain listened to the Pt. share her connections with family over the holidays.  The chaplain checked in with the Pt. and the Pt. RN-Karla with the chaplain's plans for a snack date with the Pt. Today.  **1312 Chaplain returned for spiritual care visit and popcorn snack after Pt.'s lunch.

## 2020-12-29 ENCOUNTER — Inpatient Hospital Stay (HOSPITAL_COMMUNITY): Payer: Medicaid Other | Admitting: Occupational Therapy

## 2020-12-29 ENCOUNTER — Inpatient Hospital Stay (HOSPITAL_COMMUNITY): Payer: Medicaid Other | Admitting: Physical Therapy

## 2020-12-29 ENCOUNTER — Inpatient Hospital Stay (HOSPITAL_COMMUNITY): Payer: Medicaid Other

## 2020-12-29 DIAGNOSIS — G7281 Critical illness myopathy: Secondary | ICD-10-CM | POA: Diagnosis not present

## 2020-12-29 MED ORDER — NITROFURANTOIN MONOHYD MACRO 100 MG PO CAPS
100.0000 mg | ORAL_CAPSULE | Freq: Two times a day (BID) | ORAL | Status: DC
  Administered 2020-12-29 – 2020-12-30 (×3): 100 mg via ORAL
  Filled 2020-12-29 (×3): qty 1

## 2020-12-29 NOTE — Progress Notes (Signed)
Occupational Therapy Session Note  Patient Details  Name: Sara [name] MRN: [id number] Date of Birth: 04-12-1966  Today's Date: 12/29/2020 OT Individual Time: 1300-1359 OT Individual Time Calculation (min): 59 min   Short Term Goals: Week 2:  OT Short Term Goal 1 (Week 2): Pt will perform toilet transfer with Max A of 1 OT Short Term Goal 1 - Progress (Week 2): Not met OT Short Term Goal 2 (Week 2): Pt will perform sit to stand with no more than Max A of 1 OT Short Term Goal 2 - Progress (Week 2): Progressing toward goal OT Short Term Goal 3 (Week 2): Pt will perform LB dress with Mod A OT Short Term Goal 3 - Progress (Week 2): Progressing toward goal OT Short Term Goal 4 (Week 2): Pt will perform bathing with Min A and use of AE PRN OT Short Term Goal 4 - Progress (Week 2): Progressing toward goal  Skilled Therapeutic Interventions/Progress Updates:    Pt greeted in the w/c, finishing up lunch and premedicated for pain. Agreeable to engage in sinkside activity before transferring back to bed. Tx focus placed on OOB tolerance, activity tolerance, UB strengthening and transfers during session. OT first washed her hair using the hair washing tray to positively impact psychosocial health. Pt then completed face washing and oral care, also washed her hairbrush before using the weighted blow-dryer to dry hair. Increased time for washing hands and applying lotion due to dry skin after, changed her hospital gown. +2 for slideboard<bed with vcs using the small step in room. Once pt transitioned to supine she was set up with the CPM machine for the Lt LE. Pillows placed for edema mgt in the Rt LE. Pt remained comfortably in bed with all needs within reach at close of session.   Therapy Documentation Precautions:  Precautions Precautions: Fall,Other (comment) Precaution Comments: Unrestricted ROM B knees, L ankle, B hips; PROM/AROM R wrist; colostomy; abdominal wound Required Braces or Orthoses:  Splint/Cast Splint/Cast: RUE wrist splint on when mobilizing Restrictions Weight Bearing Restrictions: Yes RUE Weight Bearing: Weight bearing as tolerated LUE Weight Bearing: Weight bearing as tolerated LUE Partial Weight Bearing Percentage or Pounds: 50 RLE Weight Bearing: Weight bearing as tolerated LLE Weight Bearing: Partial weight bearing LLE Partial Weight Bearing Percentage or Pounds: 50 Vital Signs: Therapy Vitals Temp: 98.7 F (37.1 C) Temp Source: Oral Pulse Rate: 82 Resp: 18 BP: (!) 96/58 Patient Position (if appropriate): Lying Oxygen Therapy SpO2: 100 % O2 Device: Room Air ADL: ADL Grooming: Minimal assistance Where Assessed-Grooming: Edge of bed Upper Body Bathing: Contact guard Where Assessed-Upper Body Bathing: Edge of bed Lower Body Bathing: Maximal assistance (+2) Where Assessed-Lower Body Bathing: Bed level Upper Body Dressing: Minimal assistance Where Assessed-Upper Body Dressing: Edge of bed :     Therapy/Group: Individual Therapy  Dayleen Beske A Jaci Desanto 12/29/2020, 3:19 PM

## 2020-12-29 NOTE — Progress Notes (Signed)
Physical Therapy Weekly Progress Note  Patient Details  Name: Sara [name] MRN: [id number] Date of Birth: 1966/08/13  Beginning of progress report period: December 22, 2020 End of progress report period: December 29, 2020  Today's Date: 12/29/2020 PT Individual Time: 1100-1200 PT Individual Time Calculation (min): 60 min   Patient has met 2 of 4 short term goals.  Patient progressing this week able to achieve wheelchair propulsion in standard wheelchair with elevating legrests and custom back.  She has progressed with slide board transfers to be able to move from the bed to chair with +1 A (back to bed uphill with +2).  She still needs +2 A for sit to stand and has not yet initiated gait training.  She remains limited by pain and at times motivation due to pain, nausea and other multiple issues.  However, overall progressing and will keep working toward existing long term goals.   Patient continues to demonstrate the following deficits muscle weakness, muscle joint tightness and Pain and therefore will continue to benefit from skilled PT intervention to increase functional independence with mobility.  Patient progressing toward long term goals..  Continue plan of care.  PT Short Term Goals Week 2:  PT Short Term Goal 1 (Week 2): Patient will perform bed <>chair transfers with mod A of 1 PT Short Term Goal 1 - Progress (Week 2): Met PT Short Term Goal 2 (Week 2): Patient will initiate gait with LRAD as tolerated and +2 A for safety PT Short Term Goal 2 - Progress (Week 2): Progressing toward goal PT Short Term Goal 3 (Week 2): Patient to transition to w/c able to propel with min A x 100' PT Short Term Goal 3 - Progress (Week 2): Met PT Short Term Goal 4 (Week 2): Paient sit to stand max A of 1 to appropriate AD. PT Short Term Goal 4 - Progress (Week 2): Progressing toward goal Week 3:  PT Short Term Goal 1 (Week 3): Patient to perform supine to sit with min A. PT Short Term Goal 2 (Week  3): Patient to perform sit to stand max A of 1 to appropriate AD. PT Short Term Goal 3 (Week 3): Patient to direct w/c set up for transfer then for propulsion with S. PT Short Term Goal 4 (Week 3): Patient to initiate gait training. PT Short Term Goal 5 (Week 3): Patient to perform car transfers with mod A of 2.  Skilled Therapeutic Interventions/Progress Updates:  Ambulation/gait training;Balance/vestibular training;DME/adaptive equipment instruction;Functional electrical stimulation;Functional mobility training;Neuromuscular re-education;Pain management;Patient/family education;Psychosocial support;Skin care/wound management;Splinting/orthotics;Stair training;Therapeutic Activities;Therapeutic Exercise;UE/LE Strength taining/ROM;Wheelchair propulsion/positioning   Patient in supine with HOB elevated with daughter in the room upon entry.  Noted pt with [name] visit this am and missed PT this morning, pt reports she comes every Tuesday and Friday at [place].  Patient in supine for LE therex AROM ankle pumps x 10, AAROM knee flexion and SAQ x 10.  Performed myofacial release technique with L LE IT band stretch along lateral aspect of her leg and patellar mobilization during SAQ.  Patient supine to sit with mod A for LE initiating movement to EOB and for lifting trunk.  Patient EOB with feet on stool while PT obtained shorter slide board.  Patient performed bed to chair via slide board with mod A.  In w/c PT placed legrests and pt propelled x 100' with S.  Sit to stand x 3 at rails on stairs with feet on first 3" step with +2 max A  lifting hips using pad under hips.  Patient only able to stand about 3-5 seconds max with both UE support and min A for balance pt leaning to L unable to extend trunk upright.  Patient in w/c assisted to room for energy conservation in prep for transfer back to bed, but pt agreeable to sit up in chair for lunch and for 1pm OT session so left up in chair with call light and needs in  reach.  Declined seat belt alarm and RN made aware.   Therapy Documentation Precautions:  Precautions Precautions: Fall,Other (comment) Precaution Comments: Unrestricted ROM B knees, L ankle, B hips; PROM/AROM R wrist; colostomy; abdominal wound Required Braces or Orthoses: Splint/Cast Splint/Cast: RUE wrist splint on when mobilizing Restrictions Weight Bearing Restrictions: Yes RUE Weight Bearing: Weight bearing as tolerated LUE Weight Bearing: Weight bearing as tolerated LUE Partial Weight Bearing Percentage or Pounds: 50 RLE Weight Bearing: Weight bearing as tolerated LLE Weight Bearing: Partial weight bearing LLE Partial Weight Bearing Percentage or Pounds: 50 Pain: Pain Assessment Pain Scale: 0-10 Pain Score: 7  Pain Type: Acute pain Pain Location: Leg Pain Orientation: Right;Left Pain Descriptors / Indicators: Aching Pain Frequency: Intermittent Pain Onset: On-going Patients Stated Pain Goal: 3 Pain Intervention(s): Repositioned;Cutaneous stimulation   Therapy/Group: Individual Therapy  Sara [name]  Sara [name], PT 12/29/2020, 12:15 PM

## 2020-12-29 NOTE — Progress Notes (Signed)
Pt continue to refused in and out cath. Bladder scanned at 1017 for 204 mL. Pt stated she wants to try and see if she can void. No urine occurrence noted today. Bladder scanned again at 1425 for 293 mL. Pt educated on risks on not having bladder emptied timely and the risks of developing UTI. Pt acknowledges understanding but continues to refuse. Offered pt to cath multiple time today. No urine occurrence since 0354. Pam, PA alerted.   Marylu Lund, RN
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
## 2020-12-29 NOTE — Consult Note (Signed)
WOC Nurse ostomy follow up Patient receiving care in [facility] [phone].  Daughter [name] participated in all aspects of ostomy care during our teaching session today. [name] participated in emptying existing pouch, removal of existing pouch. Observed placement of Aquacel strip into the MCS pocket. Also, placement of Aquacel strip and barrier ring over wound below the stoma and under the skin barrier.  She sized and placed the barrier ring around the stoma, cut and placed the new skin barrier and snapped the pouch in place.  All questions were answered.  [name] observed care of the abdominal wound cleansing, covering with Xeroform and the foam dressing.  Additional ostomy supplies are in the room.  No further teaching is needed with [name].  She is comfortable with the care activities associated with the ostomy. Helmut Muster, RN, MSN, CWOCN, CNS-BC, pager [phone]

## 2020-12-29 NOTE — Progress Notes (Signed)
Physical Therapy Note  Patient Details  Name: Sara [name] MRN: [id number] Date of Birth: 1966/09/02 Today's Date: 12/29/2020    PT attempted to see pt for PT treatment session however wound care present at appointment time and pt missed 60 mins of PT session. Pt left as found in bed with family present and wound care nurse.    Barbaraann Faster 12/29/2020, 9:48 AM

## 2020-12-30 ENCOUNTER — Inpatient Hospital Stay (HOSPITAL_COMMUNITY): Payer: Medicaid Other | Admitting: Physical Therapy

## 2020-12-30 DIAGNOSIS — T07XXXA Unspecified multiple injuries, initial encounter: Secondary | ICD-10-CM | POA: Diagnosis not present

## 2020-12-30 DIAGNOSIS — N390 Urinary tract infection, site not specified: Secondary | ICD-10-CM

## 2020-12-30 DIAGNOSIS — G7281 Critical illness myopathy: Secondary | ICD-10-CM | POA: Diagnosis not present

## 2020-12-30 DIAGNOSIS — E876 Hypokalemia: Secondary | ICD-10-CM | POA: Diagnosis not present

## 2020-12-30 DIAGNOSIS — Z419 Encounter for procedure for purposes other than remedying health state, unspecified: Secondary | ICD-10-CM | POA: Diagnosis not present

## 2020-12-30 DIAGNOSIS — R339 Retention of urine, unspecified: Secondary | ICD-10-CM | POA: Diagnosis not present

## 2020-12-30 DIAGNOSIS — G8918 Other acute postprocedural pain: Secondary | ICD-10-CM | POA: Diagnosis not present

## 2020-12-30 LAB — URINE CULTURE: Culture: >=100000 — AB

## 2020-12-30 MED ORDER — CEPHALEXIN 250 MG PO CAPS
250.0000 mg | ORAL_CAPSULE | Freq: Two times a day (BID) | ORAL | Status: AC
  Administered 2020-12-30 – 2021-01-02 (×7): 250 mg via ORAL
  Filled 2020-12-30 (×7): qty 1

## 2020-12-30 MED ORDER — GABAPENTIN 300 MG PO CAPS
600.0000 mg | ORAL_CAPSULE | Freq: Every day | ORAL | Status: DC
  Administered 2020-12-30 – 2021-01-16 (×18): 600 mg via ORAL
  Filled 2020-12-30 (×18): qty 2

## 2020-12-30 NOTE — Progress Notes (Signed)
Physical Therapy Session Note  Patient Details  Name: Sara [name] MRN: [id number] Date of Birth: 04/16/1966  Today's Date: 12/30/2020 PT Individual Time: 0905-0930 PT Individual Time Calculation (min): 25 min   Short Term Goals: Week 3:  PT Short Term Goal 1 (Week 3): Patient to perform supine to sit with min A. PT Short Term Goal 2 (Week 3): Patient to perform sit to stand max A of 1 to appropriate AD. PT Short Term Goal 3 (Week 3): Patient to direct w/c set up for transfer then for propulsion with S. PT Short Term Goal 4 (Week 3): Patient to initiate gait training. PT Short Term Goal 5 (Week 3): Patient to perform car transfers with mod A of 2.  Skilled Therapeutic Interventions/Progress Updates:   Pt received semi reclined in bed and agreeable to PT. Pt requesting to finish breakfast. PT performed Bil heel cord stretch 2 x 1 min BLE with intermittent cues for relaxation on the LL with noted resistance into stretch. Hip abduction/ adduction with AAROM 2 x 10 BLE. Hip/knee flexion/extension within available range  x12 BLE. SAQ x 12 BLE with AAROM on the LLE and over pressure into flexion. Pt then left in bed with call bell in reach and all needs met.         Therapy Documentation Precautions:  Precautions Precautions: Fall,Other (comment) Precaution Comments: Unrestricted ROM B knees, L ankle, B hips; PROM/AROM R wrist; colostomy; abdominal wound Required Braces or Orthoses: Splint/Cast Splint/Cast: RUE wrist splint on when mobilizing Restrictions Weight Bearing Restrictions: Yes RUE Weight Bearing: Weight bearing as tolerated LUE Weight Bearing: Weight bearing as tolerated LUE Partial Weight Bearing Percentage or Pounds: 50 RLE Weight Bearing: Weight bearing as tolerated LLE Weight Bearing: Partial weight bearing LLE Partial Weight Bearing Percentage or Pounds: 50    Vital Signs: Therapy Vitals Temp: 98 F (36.7 C) Temp Source: Oral Pulse Rate: 86 Resp: 17 BP: (!)  98/55 Patient Position (if appropriate): Lying Oxygen Therapy SpO2: 98 % O2 Device: Room Air Pain:   8/10 L leg, See MAR. Pt repositioned    Therapy/Group: Individual Therapy  Lorie Phenix 12/30/2020, 4:53 PM
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
## 2020-12-31 DIAGNOSIS — N390 Urinary tract infection, site not specified: Secondary | ICD-10-CM | POA: Diagnosis not present

## 2020-12-31 DIAGNOSIS — G8918 Other acute postprocedural pain: Secondary | ICD-10-CM | POA: Diagnosis not present

## 2020-12-31 DIAGNOSIS — R339 Retention of urine, unspecified: Secondary | ICD-10-CM | POA: Diagnosis not present

## 2020-12-31 DIAGNOSIS — T07XXXA Unspecified multiple injuries, initial encounter: Secondary | ICD-10-CM | POA: Diagnosis not present

## 2020-12-31 DIAGNOSIS — G7281 Critical illness myopathy: Secondary | ICD-10-CM | POA: Diagnosis not present

## 2020-12-31 DIAGNOSIS — E876 Hypokalemia: Secondary | ICD-10-CM | POA: Diagnosis not present
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
## 2021-01-01 ENCOUNTER — Inpatient Hospital Stay (HOSPITAL_COMMUNITY): Payer: Medicaid Other | Admitting: Occupational Therapy

## 2021-01-01 ENCOUNTER — Inpatient Hospital Stay (HOSPITAL_COMMUNITY): Payer: Medicaid Other

## 2021-01-01 ENCOUNTER — Inpatient Hospital Stay (HOSPITAL_COMMUNITY): Payer: Medicaid Other | Admitting: Physical Therapy

## 2021-01-01 DIAGNOSIS — G7281 Critical illness myopathy: Secondary | ICD-10-CM | POA: Diagnosis not present

## 2021-01-01 LAB — CBC
HCT: 33.3 % — ABNORMAL LOW (ref 36.0–46.0)
Hemoglobin: 9.8 g/dL — ABNORMAL LOW (ref 12.0–15.0)
MCH: 28.7 pg (ref 26.0–34.0)
MCHC: 29.4 g/dL — ABNORMAL LOW (ref 30.0–36.0)
MCV: 97.4 fL (ref 80.0–100.0)
Platelets: 369 10*3/uL (ref 150–400)
RBC: 3.42 MIL/uL — ABNORMAL LOW (ref 3.87–5.11)
RDW: 13.4 % (ref 11.5–15.5)
WBC: 6 10*3/uL (ref 4.0–10.5)
nRBC: 0 % (ref 0.0–0.2)

## 2021-01-01 LAB — BASIC METABOLIC PANEL
Anion gap: 11 (ref 5–15)
BUN: 9 mg/dL (ref 6–20)
CO2: 23 mmol/L (ref 22–32)
Calcium: 10 mg/dL (ref 8.9–10.3)
Chloride: 106 mmol/L (ref 98–111)
Creatinine, Ser: 0.79 mg/dL (ref 0.44–1.00)
GFR, Estimated: >60 mL/min (ref >60)
Glucose, Bld: 86 mg/dL (ref 70–99)
Potassium: 4.2 mmol/L (ref 3.5–5.1)
Sodium: 140 mmol/L (ref 135–145)

## 2021-01-01 LAB — MAGNESIUM: Magnesium: 1.6 mg/dL — ABNORMAL LOW (ref 1.7–2.4)

## 2021-01-01 MED ORDER — FLUTICASONE PROPIONATE 50 MCG/ACT NA SUSP
2.0000 | Freq: Every day | NASAL | Status: DC
  Administered 2021-01-01 – 2021-01-16 (×14): 2 via NASAL
  Filled 2021-01-01: qty 16
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
## 2021-01-01 NOTE — Progress Notes (Signed)
Pt refused multiple medications, including the Lovenox. Pt was unable to provide a reason why. Pt was educated on the importance of not skipping any doses and continued to refuse. Pt then stated " my body is achy and hurts to much for anything right now." This nurse also offered to reposition the pt, pt denied. Pt resting in bed at this time, pillow over her eyes. Call light in reach

## 2021-01-01 NOTE — Progress Notes (Signed)
Physical Therapy Session Note  Patient Details  Name: Sara [name] MRN: [id number] Date of Birth: October 08, 1966  Today's Date: 01/01/2021 PT Individual Time: 1003-1100 PT Individual Time Calculation (min): 57 min   Short Term Goals: Week 3:  PT Short Term Goal 1 (Week 3): Patient to perform supine to sit with min A. PT Short Term Goal 2 (Week 3): Patient to perform sit to stand max A of 1 to appropriate AD. PT Short Term Goal 3 (Week 3): Patient to direct w/c set up for transfer then for propulsion with S. PT Short Term Goal 4 (Week 3): Patient to initiate gait training. PT Short Term Goal 5 (Week 3): Patient to perform car transfers with mod A of 2.  Skilled Therapeutic Interventions/Progress Updates:    Patient in supine with c/o headache, earache and sore throat.  Reports MD started her on decongestant.  Performed in bed therex AAROM ankle PF/DF with stretch to heel cords, L with contract relax.  AAROM heel slides with stretch in to flexion and manual leg press each leg with resistance to extension x 10.  Supine to sit with mod A for LE's guiding to EOB, scooting hips and assist to lift trunk.  Still reports some dizziness initially sitting and pt sliding toward EOB so max A to reposition feet on stool and scoot back on bed.  Patient leaning to R to place board to L to attempt slide board into w/c to L.  Unable to scoot safely with +1 A even on long board due to sliding forward so returned to supine.  Patient in trendelenburg to scoot up to [facility] so tilted more into trendelenburg to test for BPPV.  Noted pt felt she would be spinning with R head rotation, though no actual nystagmus seen.  Performed Eply repositioning for R posterior canal BPPV with mod to max A coming upright from L turn and pt reporting symptoms in last turn of maneuver.  Patient returned to supine and positioned for comfort, left with call bell and needs in reach.  Therapy Documentation Precautions:  Precautions Precautions:  Fall,Other (comment) Precaution Comments: Unrestricted ROM B knees, L ankle, B hips; PROM/AROM R wrist; colostomy; abdominal wound Required Braces or Orthoses: Splint/Cast Splint/Cast: RUE wrist splint on when mobilizing Restrictions Weight Bearing Restrictions: Yes RUE Weight Bearing: Weight bearing as tolerated LUE Weight Bearing: Weight bearing as tolerated LUE Partial Weight Bearing Percentage or Pounds: 50 RLE Weight Bearing: Weight bearing as tolerated LLE Weight Bearing: Partial weight bearing LLE Partial Weight Bearing Percentage or Pounds: 50 Pain: Pain Assessment Pain Scale: 0-10 Pain Score: 9  Pain Type: Acute pain Pain Location: Generalized Pain Orientation: Left;Right Pain Descriptors / Indicators: Aching Pain Frequency: Intermittent Pain Onset: On-going Pain Intervention(s): Medication (See eMAR) (tylenol)    Therapy/Group: Individual Therapy  [name]  [name], PT 01/01/2021, 9:55 AM

## 2021-01-01 NOTE — Progress Notes (Signed)
Occupational Therapy Session Note  Patient Details  Name: Sara [name] MRN: [id number] Date of Birth: 19-May-1966  Today's Date: 01/01/2021 OT Individual Time: 1300-1410 and 1550-1630 OT Individual Time Calculation (min): 70 min and 40 min Missed 20 mins in afternoon session d/t fatigue   Short Term Goals: Week 2:  OT Short Term Goal 1 (Week 2): Pt will perform toilet transfer with Max A of 1 OT Short Term Goal 1 - Progress (Week 2): Not met OT Short Term Goal 2 (Week 2): Pt will perform sit to stand with no more than Max A of 1 OT Short Term Goal 2 - Progress (Week 2): Progressing toward goal OT Short Term Goal 3 (Week 2): Pt will perform LB dress with Mod A OT Short Term Goal 3 - Progress (Week 2): Progressing toward goal OT Short Term Goal 4 (Week 2): Pt will perform bathing with Min A and use of AE PRN OT Short Term Goal 4 - Progress (Week 2): Progressing toward goal Week 3:  OT Short Term Goal 1 (Week 3): Pt will perform bathing with Min A and use of AE PRN OT Short Term Goal 2 (Week 3): Pt will perform sit to stand at [employer] with assist of 1 helper OT Short Term Goal 3 (Week 3): Pt will perform an ADL activitiy for 10+ minutes without rest break  Skilled Therapeutic Interventions/Progress Updates:    Pt greeted at time of session supine in bed resting agreeable to OT session but stating she was not feeling well today, with body aches, ear ache, and congestion, nursing staff aware. Pt also emptying her own colostomy bag after set up at bed level, contents set aside for nursing to record. Pt repeatedly declining getting OOB today but agreeable to sit EOB for [place] care activity. Supine to sit EOB Mod A from flat HOB to simulate home bed, with extended time pt able to assist coming up to sitting. Dizziness noted when first sitting EOB which subsided and did not have another episode. UB/LB bathing sitting EOB with Supervision for UB and Mod A for LB as she needed help with washing feet and  declined washing periarea and buttocks but talked through process of how to wash at bed level in future. Declined pants, but agreeable to new gown which donned with supervision. Pt tolerated sitting EOB for 45+ minutes for ADL including bathing, dressing, grooming, etc. Sit to supine Mod A for BLE management, scoot up with boost feature mod A, call bell in reach all needs met.   Session 2: Pt greeted at time of session supine in bed resting with face covered by pillows and blanket, stating she was not feeling well and initially declining OT session. Pt requested rest break at beginning of session, and OT returned 20 minutes later with pt then agreeable to bed level there ex. Missed 64mns d/t fatigue. Pt performed bed level BUE strengthening with 3# dowel for chest press, bicep curl, overhead raises with torso stretch, horizontal abd/add for 2x15-20 reps pending exercise, and with 2kg green weighted ball for 2x15 tosses with twist for core strengthening. Note therapist also demonstrated importance of UB strength for slide board transfer as well as body mechanics to improve her participation and decrease burden for family members. Pt in bed supine resting with call bell in reach all needs met.    Therapy Documentation Precautions:  Precautions Precautions: Fall,Other (comment) Precaution Comments: Unrestricted ROM B knees, L ankle, B hips; PROM/AROM R wrist; colostomy; abdominal wound  Required Braces or Orthoses: Splint/Cast Splint/Cast: RUE wrist splint on when mobilizing Restrictions Weight Bearing Restrictions: Yes RUE Weight Bearing: Weight bearing as tolerated LUE Weight Bearing: Weight bearing as tolerated LUE Partial Weight Bearing Percentage or Pounds: 50 RLE Weight Bearing: Weight bearing as tolerated LLE Weight Bearing: Partial weight bearing LLE Partial Weight Bearing Percentage or Pounds: 50      Therapy/Group: Individual Therapy  Viona Gilmore 01/01/2021, 2:13 PM

## 2021-01-02 ENCOUNTER — Inpatient Hospital Stay (HOSPITAL_COMMUNITY): Payer: Medicaid Other | Admitting: Occupational Therapy

## 2021-01-02 ENCOUNTER — Inpatient Hospital Stay (HOSPITAL_COMMUNITY): Payer: Medicaid Other

## 2021-01-02 DIAGNOSIS — G7281 Critical illness myopathy: Secondary | ICD-10-CM | POA: Diagnosis not present

## 2021-01-02 LAB — RESP PANEL BY RT-PCR (RSV, FLU A&B, COVID)  RVPGX2
Influenza A by PCR: NEGATIVE
Influenza B by PCR: NEGATIVE
Resp Syncytial Virus by PCR: NEGATIVE
SARS Coronavirus 2 by RT PCR: NEGATIVE

## 2021-01-02 NOTE — Progress Notes (Signed)
Patient refused megace and phenagran at this time. No further complications noted. Jay Schlichter, LPN

## 2021-01-02 NOTE — Progress Notes (Signed)
May discontinue airborne precautions due to negative covid, flu and RSV results in results review. No complications noted at this time. Patient denies any needs at this time. Jay Schlichter, LPN

## 2021-01-02 NOTE — Progress Notes (Signed)
Physical Therapy Session Note  Patient Details  Name: Sara [name] MRN: [id number] Date of Birth: [birth year]  Today's Date: 01/02/2021 PT Individual Time: 1515-1600 PT Individual Time Calculation (min): 45 min   Short Term Goals: Week 3:  PT Short Term Goal 1 (Week 3): Patient to perform supine to sit with min A. PT Short Term Goal 2 (Week 3): Patient to perform sit to stand max A of 1 to appropriate AD. PT Short Term Goal 3 (Week 3): Patient to direct w/c set up for transfer then for propulsion with S. PT Short Term Goal 4 (Week 3): Patient to initiate gait training. PT Short Term Goal 5 (Week 3): Patient to perform car transfers with mod A of 2.  Skilled Therapeutic Interventions/Progress Updates:    Patient in supine reports body aches and LE pain, though just got pain medication not too long ago.  States though achy and cold symptoms negative for flu/covid.  Patient assisted to empty colostomy bag at her request.  Performed tilt in Kreg bed to 20 degrees then assisted to slide for feet to foot board.  Then applied straps at thighs and low trunk.  Tilted to 40 degrees with encouragement, pt requesting quick return to supine due to L LE pain, but with cues and encouragement tolerated about 5 minutes.  Patient in semi-chair position for LE therex including LAQ, heel slides AAROM for knee flexion, hamstring sets w/ 5 sec hold,  hip abduction with R orange resistance band, L no resistance, all x 10 reps.  Left up in semi-chair position with legs elevated and needs in reach.   Therapy Documentation Precautions:  Precautions Precautions: Fall,Other (comment) Precaution Comments: Unrestricted ROM B knees, L ankle, B hips; PROM/AROM R wrist; colostomy; abdominal wound Required Braces or Orthoses: Splint/Cast Splint/Cast: RUE wrist splint on when mobilizing Restrictions Weight Bearing Restrictions: Yes RUE Weight Bearing: Weight bearing as tolerated LUE Weight Bearing: Weight bearing as  tolerated LUE Partial Weight Bearing Percentage or Pounds: 50 RLE Weight Bearing: Weight bearing as tolerated LLE Weight Bearing: Partial weight bearing LLE Partial Weight Bearing Percentage or Pounds: 50 Pain: Pain Assessment Pain Score: 8  Pain Type: Acute pain Pain Location: Leg Pain Orientation: Left Pain Descriptors / Indicators: Grimacing;Guarding Pain Onset: With Activity Pain Intervention(s): Emotional support;Repositioned    Therapy/Group: Individual Therapy  [name]  [name], PT 01/02/2021, 5:52 PM

## 2021-01-02 NOTE — Progress Notes (Signed)
Pt refused all morning meds except the oxy. Pt educated on importance of at least taking the nausea medication prior to therapy. Pt continued to refuse. Call light in reach

## 2021-01-02 NOTE — Progress Notes (Signed)
Occupational Therapy Session Note  Patient Details  Name: Sara [name] MRN: [id number] Date of Birth: 1966-05-08  Today's Date: 01/02/2021 OT Individual Time: 1301-1410 OT Individual Time Calculation (min): 69 min    Short Term Goals: Week 3:  OT Short Term Goal 1 (Week 3): Pt will perform bathing with Min A and use of AE PRN OT Short Term Goal 2 (Week 3): Pt will perform sit to stand at [facility] with assist of 1 helper OT Short Term Goal 3 (Week 3): Pt will perform an ADL activitiy for 10+ minutes without rest break  Skilled Therapeutic Interventions/Progress Updates:    Pt greeted at time of session supine in bed sleeping with pillows covering her face and stating she felt terrible, did not feel up to doing OT today. Pt agreeable with MAX encouragement on purpose and role of OT, importance of movement, etc. Pt however, repeatedly declining ADL, washing face, sitting EOB and/or getting OOB despite max encouragement to do so and education on importance. Discussion regarding why declining cath with nursing staff, attempting to work out a better position to urinate at bed level. Pt placed in chair position w/ Kreg bed features with instructions this may feel more natural for using bed pan with nursing staff to simulate toilet, also encouraged to sit in this position for eating. Pt remained in chair position throughout session within tolerance for knee flexion. Extensive discussion throughout for DC planning for son/family members to assist with ADL, CLOF and assistance needed at home, DME needs, etc. Pt performed bed level therex in chair position alternating between resistance training and dynamic core work with 3# dowel for bicep curl, chest press, overhead press and overhead stretch at end range in reps of 15-20. Hitting large beach ball with 3# dowel as well for 3x20. Pt returned chair mode > supine and scooted up in bed CGA with boost feature. Call bell in reach all needs met.   Therapy  Documentation Precautions:  Precautions Precautions: Fall,Other (comment) Precaution Comments: Unrestricted ROM B knees, L ankle, B hips; PROM/AROM R wrist; colostomy; abdominal wound Required Braces or Orthoses: Splint/Cast Splint/Cast: RUE wrist splint on when mobilizing Restrictions Weight Bearing Restrictions: Yes RUE Weight Bearing: Weight bearing as tolerated LUE Weight Bearing: Weight bearing as tolerated LUE Partial Weight Bearing Percentage or Pounds: 50 RLE Weight Bearing: Weight bearing as tolerated LLE Weight Bearing: Partial weight bearing LLE Partial Weight Bearing Percentage or Pounds: 50     Therapy/Group: Individual Therapy  Viona Gilmore 01/02/2021, 1:35 PM
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
## 2021-01-02 NOTE — Progress Notes (Signed)
WOC informed myself of ostomy bag in good condition and not needing to be changed at this time.   Patient refused zinc and promethazine medication. Patient educated on medication and importance. Patient refused PRAFO boots, stated it feels uncomfortable. Patient educated on prafo boots. Patient refused to be transferred to attempt to void on commode because of bladder scan 500. Informed patient of having to be cathed. Patient cath/d output 570. Patient attempted to refuse for ostomy to be emptied, but encouraged patient that it needed to be done. Ostomy emptied with 50cc output looks brown and have mushy consistency. Abdominal dressing changed. Scratch noticed to right buttocks, foam applied. Bilateral foam dressings to feet changed. No open areas to bilateral foot noticed. Patient put on airborne precautions to rule out flu, RSV and covid per doctor noted. Nasal swab done and walked down to lab. Patient has no s/s of distress noted at this time. Generalized non-pitting edema present. Lung sounds clear. No complications noted at this time. Patient lying in bed in lowest position with call bell in reach.  Jay Schlichter, LPN

## 2021-01-02 NOTE — Progress Notes (Signed)
Toya, NT reported pts bladder scan of 400+ ml in the bladder. Pt was asked about the in/out cath. Pt refused and requested to wait until she gets up. This nurse educated the pt on the importance of emptying the bladder, pt continued to refuse. Provider will be made aware.

## 2021-01-02 NOTE — Progress Notes (Signed)
Physical Therapy Session Note  Patient Details  Name: Sara [name] MRN: [id number] Date of Birth: 19-Sep-1966  Today's Date: 01/02/2021 PT Individual Time: 1000-1115 PT Individual Time Calculation (min): 75 min   Short Term Goals: Week 1:  PT Short Term Goal 1 (Week 1): Pt will perform supine<>sit with max assist of 1 PT Short Term Goal 1 - Progress (Week 1): Met PT Short Term Goal 2 (Week 1): Pt will perform bed<>chair transfers using LRAD with max assist of 1 and +2 min assist PT Short Term Goal 2 - Progress (Week 1): Met PT Short Term Goal 3 (Week 1): Pt will perform sit<>stands using equipment as needed with +2 max assist PT Short Term Goal 3 - Progress (Week 1): Met PT Short Term Goal 4 (Week 1): Pt will tolerate sitting upright, OOB in TIS w/c for at least 3 total hours a day PT Short Term Goal 4 - Progress (Week 1): Progressing toward goal Week 2:  PT Short Term Goal 1 (Week 2): Patient will perform bed <>chair transfers with mod A of 1 PT Short Term Goal 1 - Progress (Week 2): Met PT Short Term Goal 2 (Week 2): Patient will initiate gait with LRAD as tolerated and +2 A for safety PT Short Term Goal 2 - Progress (Week 2): Progressing toward goal PT Short Term Goal 3 (Week 2): Patient to transition to w/c able to propel with min A x 100' PT Short Term Goal 3 - Progress (Week 2): Met PT Short Term Goal 4 (Week 2): Paient sit to stand max A of 1 to appropriate AD. PT Short Term Goal 4 - Progress (Week 2): Progressing toward goal Week 3:  PT Short Term Goal 1 (Week 3): Patient to perform supine to sit with min A. PT Short Term Goal 2 (Week 3): Patient to perform sit to stand max A of 1 to appropriate AD. PT Short Term Goal 3 (Week 3): Patient to direct w/c set up for transfer then for propulsion with S. PT Short Term Goal 4 (Week 3): Patient to initiate gait training. PT Short Term Goal 5 (Week 3): Patient to perform car transfers with mod A of 2.  Skilled Therapeutic  Interventions/Progress Updates:    PAIN 8/10, requested pain meds, treatment to tolerance.  Pt now on airborne precautions awaiting testing for COVID.  Treatment contained to room.  Pt states she feels terrible and is agreeable to bed level therex only.  Therapist/patient provided w/surgical mask as only available option.  Supine therex: 2 x20 each Heel slides AAROM TKEs w/ankle DF Hip abd/ADD Hip IR/ER SLR AAROM Glut sets   Knee flexion stretch RLE tolerated 2 min x 2 to avail end range                                     LLE tolerated 20sec x 1, 15 sec x 1 to avail end range.   Pt declined sitting on edge of bed but did agree to semistand via tilt feature of KREG bed to mpromote ankle DF ROM, LE wbing, and improved standing tolerance. Safety belts attached and fastened by therapist. LEs repositioned for symmetrical wbing as needed. Pt gradually progressed from 15* to 36* and tolerated total time of 9 min in various degrees of elevation (2 in at 36*).  Returned gradually to flat.    Pt placed in trendelenberg and able to scoot Speedy  to head of bed w/cues only.  Returned to upright.  Pt left supine w/rails up x 3, alarm set, bed in lowest position, and needs in reach.  Therapy Documentation Precautions:  Precautions Precautions: Fall,Other (comment) Precaution Comments: Unrestricted ROM B knees, L ankle, B hips; PROM/AROM R wrist; colostomy; abdominal wound Required Braces or Orthoses: Splint/Cast Splint/Cast: RUE wrist splint on when mobilizing Restrictions Weight Bearing Restrictions: Yes RUE Weight Bearing: Weight bearing as tolerated LUE Weight Bearing: Weight bearing as tolerated LUE Partial Weight Bearing Percentage or Pounds: 50 RLE Weight Bearing: Weight bearing as tolerated LLE Weight Bearing: Partial weight bearing LLE Partial Weight Bearing Percentage or Pounds: 50 .  Therapy/Group: Individual Therapy  Callie Fielding, [place] 01/02/2021, 12:36  PM

## 2021-01-02 NOTE — Patient Care Conference (Signed)
Inpatient RehabilitationTeam Conference and Plan of Care Update Date: 01/02/2021   Time: 11:38 AM    Patient Name: Sara [name]      Medical Record Number: [id number]  Date of Birth: Dec 04, 1966 Sex: Female         Room/Bed: 4W23C/4W23C-01 Payor Info: Payor: [place] MEDICAID PREPAID HEALTH PLAN / Plan: [place] MEDICAID [facility] / Product Type: *No Product type* /    Admit Date/Time:  12/14/2020  3:55 PM  Primary Diagnosis:  Intensive care (ICU) myopathy  Hospital Problems: Principal Problem:   Intensive care (ICU) myopathy Active Problems:   Debility   Malnutrition of moderate degree   Urinary retention   Hypokalemia   Hypomagnesemia   Multiple trauma   Postoperative pain   Acute lower UTI    Expected Discharge Date: Expected Discharge Date: 01/17/21 (pending gains with therapy)  Team Members Present: Physician leading conference: Dr. Genice Rouge Care Coodinator Present: Chana Bode, RN, BSN, CRRN;Christina [place], BSW Nurse Present: Jesusita Oka, LPN PT Present: [name], PT OT Present: Earleen Newport, OT PPS Coordinator present : Fae Pippin, [place], PT     Current Status/Progress Goal Weekly Team Focus  Bowel/Bladder   Pt is cont/incont. Pt has ostomy LLQ, PVR Q6-8h. Pt has been refusing the in/out cath  Pt will regain continence of bladder. Pt will learn how to take care of ostomy.  bladder regimen q2h, continue ostomy care   Swallow/Nutrition/ Hydration             ADL's   Mod A bed level bathing but also performing at sink level now, slide board Mod A x2 for safety. Stedy toilet transfers Max/total of 2, limited by knee flexion  Min overall including LB ADLs and ADL transfers  lateral leans, slide board transfers, UB strength, OOB tolerance, LB ADLs with AE/compensatory techniques   Mobility   mod A bed mobility, mod A slide board transfers to w/c, +2 back to bed from w/c (Higher surface), sit to stand +2 A, standing <10 seconds due to pain, sitting  balance S, w/c mobility S.  min A transfers except mod A car, mod A ambulation 50'  working on LE strength, sit to stand as able, vertigo treatment, upright tolerance, LE AROM /strength   Communication             Safety/Cognition/ Behavioral Observations            Pain   pt c/o bilateral LE pain PRN oxy effective  Pt will be free of pain  Assess painqhift/PRN   Skin   pt has multiple surgery related incions. midline wound, small wound near ostomy stoma  Pt wounds will continue to heal and remain free from infection        Discharge Planning:  Discharging to mothers home home with family assistance from dauhgter, mother and sister (daughter arranging 27/7 schedule with family) NO DME, 2 Level home, will remain on 1st 6steps with railings   Team Discussion: Patient reports not feeling well; with aches and pains, sore throat, etc. MD testing for flu/COVID, etc. Patient is Glance limiting, refusing care, medications, and treatments. Patient declines Mccroskey management of ostomy and refuses to wear PRAFOs. Little progress noted although more participation noted this week than previously noted. Patient on target to meet rehab goals: Able to bathe with mod assist at the sink. Requires a stedy to [facility] with max-total assistance. Transfers mod assist with a slideboard and can be supervision with wheelchair mobility.  *See Care  Plan and progress notes for long and short-term goals.   Revisions to Treatment Plan:   Teaching Needs: Transfers, toileting, ostomy care, intermittent catheterization, medications, etc.   Current Barriers to Discharge: Decreased caregiver support, Home enviroment access/layout, Neurogenic bowel and bladder and Behavior  Possible Resolutions to Barriers: SNF recommended  Family education     Medical Summary Current Status: aching "all over"- refused anti-nausea meds, lovenox, etc; refuses I/o caths regularly. c/o URI Sx's- refused trying BSC;  Barriers to Discharge:  Behavior;Decreased family/caregiver support;Home enviroment access/layout;Neurogenic Bowel & Bladder;Weight bearing restrictions;Wound care;Other (comments);Incontinence;Medical stability;Medication compliance;Nutrition means  Barriers to Discharge Comments: colostomy- which is new- refused being changed- didn't take no for an answer-nursing;  refuses PRAFOs; NWB status. +2 to stand- won't stoand - only x1. Possible Resolutions to [employer]: doing flu/RSV/COVID testing- is negative- not making a lot of progress- poor participation- but slightly better than last week; baths mod A sink level; toileting biggest focus; max A of 2- steady to [facility]- pees better; can't void on bedpan;  1/19- if makes gains- earlier if not.   Continued Need for Acute Rehabilitation Level of Care: The patient requires daily medical management by a physician with specialized training in physical medicine and rehabilitation for the following reasons: Direction of a multidisciplinary physical rehabilitation program to maximize functional independence : Yes Medical management of patient stability for increased activity during participation in an intensive rehabilitation regime.: Yes Analysis of laboratory values and/or radiology reports with any subsequent need for medication adjustment and/or medical intervention. : Yes   I attest that I was present, lead the team conference, and concur with the assessment and plan of the team.   Chana Bode B 01/02/2021, 4:11 PM

## 2021-01-02 NOTE — Consult Note (Signed)
WOC Nurse wound follow up  Patient receiving care in [facility] 4W23. Ostomy pouch changed over the weekend.  Pouch does not need changing at this time.  I have requested additional ostomy supplies to be ordered by the [place]. Helmut Muster, RN, MSN, CWOCN, CNS-BC, pager [phone]

## 2021-01-03 ENCOUNTER — Inpatient Hospital Stay (HOSPITAL_COMMUNITY): Payer: Medicaid Other

## 2021-01-03 ENCOUNTER — Inpatient Hospital Stay (HOSPITAL_COMMUNITY): Payer: Medicaid Other | Admitting: Occupational Therapy

## 2021-01-03 DIAGNOSIS — Z7189 Other specified counseling: Secondary | ICD-10-CM

## 2021-01-03 DIAGNOSIS — G7281 Critical illness myopathy: Secondary | ICD-10-CM | POA: Diagnosis not present

## 2021-01-03 DIAGNOSIS — R531 Weakness: Secondary | ICD-10-CM | POA: Diagnosis not present

## 2021-01-03 DIAGNOSIS — R5381 Other malaise: Secondary | ICD-10-CM | POA: Diagnosis not present

## 2021-01-03 DIAGNOSIS — E440 Moderate protein-calorie malnutrition: Secondary | ICD-10-CM | POA: Diagnosis not present

## 2021-01-03 MED ORDER — CARBAMIDE PEROXIDE 6.5 % OT SOLN
5.0000 [drp] | Freq: Two times a day (BID) | OTIC | Status: AC
  Administered 2021-01-03 – 2021-01-05 (×6): 5 [drp] via OTIC
  Filled 2021-01-03: qty 15

## 2021-01-03 NOTE — Progress Notes (Signed)
Patient ID: Sara [name], female   DOB: 15-Jun-1966, 55 y.o.   MRN: [id number]   SW made attempt to call patient daughter yesterday after conference, left VM. Another attempt made today, no answer- left VM. Will continue to follow up   Lavera Guise, [place] [phone]
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
## 2021-01-03 NOTE — Progress Notes (Signed)
Physical Therapy Session Note  Patient Details  Name: Sara [name] MRN: [id number] Date of Birth: 01/25/1966  Today's Date: 01/03/2021 PT Individual Time: Session1: 4098-1191; Session2:1350-1445 PT Individual Time Calculation (min): 60 min & 55 min (missed 20 minutes)  Short Term Goals: Week 3:  PT Short Term Goal 1 (Week 3): Patient to perform supine to sit with min A. PT Short Term Goal 2 (Week 3): Patient to perform sit to stand max A of 1 to appropriate AD. PT Short Term Goal 3 (Week 3): Patient to direct w/c set up for transfer then for propulsion with S. PT Short Term Goal 4 (Week 3): Patient to initiate gait training. PT Short Term Goal 5 (Week 3): Patient to perform car transfers with mod A of 2.  Skilled Therapeutic Interventions/Progress Updates:    Session1: Patient in supine and reports still not feeling well.  Patient in supine for therex including ankle pumps, quad sets, AAROM knee flexion all x 10.  Patient performed supine to sit with mod I and increased time to move legs toward EOB and assist to scoot hips and lift trunk with HOB elevated.  Patient did not c/o vertigo initially upon sitting.  Patient seated with feet on 8" stool to brush her hair.  Performed slide board transfer to w/c with mod A of 2 for safety.  Patient in w/c assisted to apply w/c parts.  Propelled x 120' with S.  Performed Kinetron at 60-70 cm/sec seated x 2-3 minutes x 2 bouts.  Measured knee AROM seated with feet on Kinetron 63 degrees on L and 70 degrees on R.  Patient assisted back to room in w/c and performed slide board transfer back to bed with mod A of 2.  Patient sit to supine mod A.  Left in supine with call bell and needs in reach.  Session2:Patient in supine still eating lunch.  Missed 20 minutes skilled PT due to eating lunch.  Assisted to don shoes in supine.  Patient supine to sit with mod A and increased time.  Positioned with feet on stool in sitting patient performed slide board transfer to  w/c with mod A (+2 for safety).  From w/c sit to stand to RW with +2 max A lifting pad under her hips, and pt requesting to sit quickly.  Assisted to sink in w/c and pt sit to stand pulling up on sink with +2 mod A, but again not able to stay standing.  Patient in w/c propelled 150' with S.  Assisted rest of distance to ortho gym.  Performed UBE seated in w/c 1.5 min forward and 1.5 min reverse at level 1.  Seated stretches for chest and triceps with A.  Patient assisted in w/c to room.  Left seated in w/c with call bell and needs in reach.  Therapy Documentation Precautions:  Precautions Precautions: Fall,Other (comment) Precaution Comments: Unrestricted ROM B knees, L ankle, B hips; PROM/AROM R wrist; colostomy; abdominal wound Required Braces or Orthoses: Splint/Cast Splint/Cast: RUE wrist splint on when mobilizing Restrictions Weight Bearing Restrictions: Yes RUE Weight Bearing: Weight bearing as tolerated LUE Weight Bearing: Weight bearing as tolerated LUE Partial Weight Bearing Percentage or Pounds: 50 RLE Weight Bearing: Weight bearing as tolerated LLE Weight Bearing: Partial weight bearing LLE Partial Weight Bearing Percentage or Pounds: 50 General: PT Amount of Missed Time (min): 20 Minutes PT Missed Treatment Reason: Other (Comment) (eating lunch) Pain: Pain Assessment Pain Scale: 0-10 Pain Score: 8  Faces Pain Scale: Hurts little more  Pain Type: Acute pain Pain Location: Leg Pain Orientation: Right;Left Pain Descriptors / Indicators: Aching Pain Onset: On-going Pain Intervention(s): Rest;Repositioned    Therapy/Group: Individual Therapy  [name]  [name], PT 01/03/2021, 11:50 AM

## 2021-01-03 NOTE — Progress Notes (Signed)
Occupational Therapy Session Note  Patient Details  Name: Sara [name] MRN: [id number] Date of Birth: 1966/06/10  Today's Date: 01/03/2021 OT Individual Time: 7829-5621 OT Individual Time Calculation (min): 56 min    Short Term Goals: Week 3:  OT Short Term Goal 1 (Week 3): Pt will perform bathing with Min A and use of AE PRN OT Short Term Goal 2 (Week 3): Pt will perform sit to stand at [facility] with assist of 1 helper OT Short Term Goal 3 (Week 3): Pt will perform an ADL activitiy for 10+ minutes without rest break   Skilled Therapeutic Interventions/Progress Updates:    Pt greeted at time of session sitting up in wheelchair from previous PT session today, agreeable to OT session despite not feeling well. Transported to gym dependent for time management and set up at [facility] to perform sit to stands and standing activities for weight bearing, but when setting up Stedy pt stating that she "cannot do it because it hurts my knees too much" and refusing to stand in [place]. Offered to stand in parallel bars, pt declined and agreeable to stand at stairs with encouragement as she has done in other sessions. Set up at stairs and attempted sit to stand with 2 helpers and able to partially lift buttocks with one helper in front and one on her side, pt quickly stating "I cant do it" and returning [place] to sitting. Reports significant knee pain when trying to stand, no number given. Agreeable to slide board transfer to/from mat and performed with 2 helpers wheelchair <> mat with chuck pad underneath for sliding. When sitting EOM, therapist demonstrated slide board transfer for importance of head/hips relationship and verbalized understanding with some carryover noted in later transfer. Lateral leans L/R EOM to improve ability to lean for LB ADL and for board placement, able to perform Min A with max encouragement to do so. Slide board back to wheelchair in same manner as above, transported to room dependent and  slide board w/c > bed 2 helpers as well with Mod/Max A. Sit to supine Max A today, call bell in reach.   Therapy Documentation Precautions:  Precautions Precautions: Fall,Other (comment) Precaution Comments: Unrestricted ROM B knees, L ankle, B hips; PROM/AROM R wrist; colostomy; abdominal wound Required Braces or Orthoses: Splint/Cast Splint/Cast: RUE wrist splint on when mobilizing Restrictions Weight Bearing Restrictions: Yes RUE Weight Bearing: Weight bearing as tolerated LUE Weight Bearing: Weight bearing as tolerated LUE Partial Weight Bearing Percentage or Pounds: 50 RLE Weight Bearing: Weight bearing as tolerated LLE Weight Bearing: Partial weight bearing LLE Partial Weight Bearing Percentage or Pounds: 50     Therapy/Group: Individual Therapy  Erasmo Score 01/03/2021, 4:37 PM

## 2021-01-03 NOTE — Progress Notes (Signed)
Patient ID: Sara [name], female   DOB: [birth year], 55 y.o.   MRN: [id number]  Patient transitioned to inpatient CIR rehabilitation unit.  Today is day 63  Medical records reviewed, discussed with care team and then this  NP visited patient at the bedside as a follow up to for palliative medicine needs and emotional support.  She is in bed, appears to be sleeping.  She easily arouses to her name. [name] tells me that she is been "working with therapy" and feels as if "she is a little better".  I shared with [name] my concern with her motivation and progress.   I acknowledged how difficult this has been for her.   Created space and opportunity for patient to explore her thoughts and feelings regarding current medical situation.   She tells me that she is trying as hard as she can.    We discussed that likely it will be soon for the next transition of care.  [name] tells me that she will be going home and her son will be helping to care for her.  Education offered on the importance of conversation and documentation of healthcare power of attorney and advanced care planning documents.  MOST form introduced.  Recommended that family meet at the bedside for continued conversation regarding these important topics.  [name] agrees.  I left a message for daughter and await callback to schedule a family meeting.  Ongoing education regarding the importance of  personal responsibility in treatment plan.  Again introduced mind, body, spirit and positive talk/ affirmations.   Questions and concerns addressed     PMT will continue to support holistically    Recommend outpatient community-based palliative on discharge.  Total time spent on the unit was 35 minutes  Greater than 50% of the time was spent in counseling and coordination of care  [name] NP  Palliative Medicine Team Team Phone # [phone] Pager [phone]

## 2021-01-04 ENCOUNTER — Inpatient Hospital Stay (HOSPITAL_COMMUNITY): Payer: Medicaid Other

## 2021-01-04 ENCOUNTER — Inpatient Hospital Stay (HOSPITAL_COMMUNITY): Payer: Medicaid Other | Admitting: Physical Therapy

## 2021-01-04 ENCOUNTER — Inpatient Hospital Stay (HOSPITAL_COMMUNITY): Payer: Medicaid Other | Admitting: Occupational Therapy

## 2021-01-04 DIAGNOSIS — Z515 Encounter for palliative care: Secondary | ICD-10-CM | POA: Diagnosis not present

## 2021-01-04 DIAGNOSIS — R5381 Other malaise: Secondary | ICD-10-CM | POA: Diagnosis not present

## 2021-01-04 DIAGNOSIS — S52501A Unspecified fracture of the lower end of right radius, initial encounter for closed fracture: Secondary | ICD-10-CM | POA: Diagnosis not present

## 2021-01-04 DIAGNOSIS — G7281 Critical illness myopathy: Secondary | ICD-10-CM | POA: Diagnosis not present

## 2021-01-04 DIAGNOSIS — S92001A Unspecified fracture of right calcaneus, initial encounter for closed fracture: Secondary | ICD-10-CM | POA: Diagnosis not present

## 2021-01-04 DIAGNOSIS — S82142A Displaced bicondylar fracture of left tibia, initial encounter for closed fracture: Secondary | ICD-10-CM | POA: Diagnosis not present

## 2021-01-04 DIAGNOSIS — E440 Moderate protein-calorie malnutrition: Secondary | ICD-10-CM | POA: Diagnosis not present

## 2021-01-04 DIAGNOSIS — R531 Weakness: Secondary | ICD-10-CM | POA: Diagnosis not present

## 2021-01-04 DIAGNOSIS — S72301A Unspecified fracture of shaft of right femur, initial encounter for closed fracture: Secondary | ICD-10-CM | POA: Diagnosis not present

## 2021-01-04 DIAGNOSIS — S72302A Unspecified fracture of shaft of left femur, initial encounter for closed fracture: Secondary | ICD-10-CM | POA: Diagnosis not present

## 2021-01-04 DIAGNOSIS — Z7189 Other specified counseling: Secondary | ICD-10-CM | POA: Diagnosis not present

## 2021-01-04 LAB — BASIC METABOLIC PANEL
Anion gap: 10 (ref 5–15)
BUN: 12 mg/dL (ref 6–20)
CO2: 22 mmol/L (ref 22–32)
Calcium: 9.8 mg/dL (ref 8.9–10.3)
Chloride: 106 mmol/L (ref 98–111)
Creatinine, Ser: 0.69 mg/dL (ref 0.44–1.00)
GFR, Estimated: >60 mL/min (ref >60)
Glucose, Bld: 86 mg/dL (ref 70–99)
Potassium: 4 mmol/L (ref 3.5–5.1)
Sodium: 138 mmol/L (ref 135–145)

## 2021-01-04 NOTE — Progress Notes (Signed)
Occupational Therapy Weekly Progress Note  Patient Details  Name: Sara [name] MRN: [id number] Date of Birth: 11/29/1966  Beginning of progress report period: December 28, 2020 End of progress report period: January 04, 2021  Today's Date: 01/04/2021 OT Individual Time: 8250-0370 OT Individual Time Calculation (min): 62 min    Patient has met 1 of 3 short term goals.  Pt has been inconsistent with progress toward OT goals d/t not feeling well, limited participation, and overall aching. Pt declines LB dressing but will plan on progressing toward this in upcoming week. Performing slide board transfers with Mod A of 2 to participate in sink level bathing/ADL and to improve time OOB. Pt has performed Stedy transfer with x2 assist, but does not like to use, planning to progress to slide board to drop arm as well. Planning for family ed this upcoming week as well with plan for family to take pt home.   Patient continues to demonstrate the following deficits: muscle weakness, decreased cardiorespiratoy endurance, impaired timing and sequencing and decreased motor planning and decreased sitting balance, decreased standing balance, decreased postural control and decreased balance strategies and therefore will continue to benefit from skilled OT intervention to enhance overall performance with BADL and Reduce care partner burden.  Patient not progressing toward long term goals.  See goal revision..  Plan of care revisions: include downgraded goals to more Mod/Max level d/t lack of progress.  OT Short Term Goals Week 3:  OT Short Term Goal 1 (Week 3): Pt will perform bathing with Min A and use of AE PRN OT Short Term Goal 1 - Progress (Week 3): Progressing toward goal OT Short Term Goal 2 (Week 3): Pt will perform sit to stand at [facility] with assist of 1 helper OT Short Term Goal 2 - Progress (Week 3): Progressing toward goal OT Short Term Goal 3 (Week 3): Pt will perform an ADL activitiy for 10+ minutes  without rest break OT Short Term Goal 3 - Progress (Week 3): Met Week 4:  OT Short Term Goal 1 (Week 4): Pt will perform LB dress at bed level with Mod A OT Short Term Goal 2 (Week 4): Pt will perform BSC transfer w/ LRAD with Max A of 1 OT Short Term Goal 3 (Week 4): Pt will perform UB/LB bathing with AE PRN Min A to decrease caregiver burden  Skilled Therapeutic Interventions/Progress Updates:    Pt greeted at time of session reclined in bed resting agreeable to OT session as she says she likes these later sessions in the am. No pain resting, some discomfort later in BLEs with movement but no number given and rest breaks PRN. Pt had questions at beginning of session regarding purpose for family coming in tomorrow for meeting/training and explained that d/t slow progress and need for family education/training, they will be coming in. Brief change at bed level rolling L/R with Min A today only assist for LE management with improved initiation, dependent brief change and pt ed on technique. Also washed periarea with HOB elevated for first time today. Supine to sit mod A, lateral lean onto R elbow and 2nd helper for board placement, Mod of 2 to wheelchair. Assisted with hair washing, pt helping with drying off. Slide board back to bed same manner. Sit to supine Mod A as well. Call bell in reach all needs met.    Therapy Documentation Precautions:  Precautions Precautions: Fall,Other (comment) Precaution Comments: WBAT all 4 extremeties as of 1/6, Unrestricted ROM B knees,  L ankle, B hips; PROM/AROM R wrist; colostomy; abdominal wound Required Braces or Orthoses: Splint/Cast Splint/Cast: RUE wrist splint on when mobilizing Restrictions Weight Bearing Restrictions: Yes RUE Weight Bearing: Weight bearing as tolerated LUE Weight Bearing: Weight bearing as tolerated LUE Partial Weight Bearing Percentage or Pounds: 50 RLE Weight Bearing: Weight bearing as tolerated LLE Weight Bearing: Weight bearing as  tolerated LLE Partial Weight Bearing Percentage or Pounds: 50    Therapy/Group: Individual Therapy  Viona Gilmore 01/04/2021, 12:19 PM

## 2021-01-04 NOTE — Progress Notes (Signed)
This chaplain was present for Pt. spiritual care with Bella-pet therapy dog and Don-handler. Royden Purl joined the visit.  The Pt. responded well to the visit.  The chaplain understands when [facility] visits the hospital the Pt. will be on the list for a visit.  This chaplain is available for F/U spiritual care as needed.
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
## 2021-01-04 NOTE — Progress Notes (Addendum)
Patient ID: Sara [name], female   DOB: 05-27-1966, 55 y.o.   MRN: [id number]   Family meeting scheduled next Thursday, January 13th at 10 AM.  Lavera Guise, [place] [phone]

## 2021-01-04 NOTE — Progress Notes (Signed)
Patient ID: Sara [name], female   DOB: [birth year], 55 y.o.   MRN: [id number]   SW made attempt to schedule family meeting with patient daughter and family for next week with care team (SW, Palliative, care coordinator and therapy team). Daughter will call SW back with availability.   [place], [place] [phone]

## 2021-01-04 NOTE — Progress Notes (Signed)
Patient ID: Sara [name], female   DOB: February 17, 1966, 55 y.o.   MRN: [id number]   Family education scheduled Friday, 9-12 with daughter and patient sis Sara [name].  [place], [place] [phone]

## 2021-01-04 NOTE — Progress Notes (Signed)
Physical Therapy Session Note  Patient Details  Name: Sara [name] MRN: [id number] Date of Birth: 04/01/1966  Today's Date: 01/04/2021 PT Individual Time: 4128-7867 PT Individual Time Calculation (min): 70 min   Short Term Goals: Week 3:  PT Short Term Goal 1 (Week 3): Patient to perform supine to sit with min A. PT Short Term Goal 2 (Week 3): Patient to perform sit to stand max A of 1 to appropriate AD. PT Short Term Goal 3 (Week 3): Patient to direct w/c set up for transfer then for propulsion with S. PT Short Term Goal 4 (Week 3): Patient to initiate gait training. PT Short Term Goal 5 (Week 3): Patient to perform car transfers with mod A of 2.  Skilled Therapeutic Interventions/Progress Updates:    Patient in supine reporting some abdominal pain.  States went for x-rays and noted in chart fractures stable and pt now WBAT. Performed LE AROM/AAROM ankle pumps x 10 the stretch to L ankle with contract-relax technique, AAROM knee flexion bilaterally x 10.  Supine to sit with mod A pt moving LE's off bed and A to lift trunk and stabilize at EOB with feet on 8" step on Kreg bed.  Donned shoes sitting EOB, then performed slide board transfer to w/c with mod A (+2 for safety).  Patient in w/c set up with elevating legrests and pt propelled 150' with S.  Seated in w/c performed Kinetron x 2 x 2 minutes at 70 cm/sec.  Seated with feet on floor to toss bean bags to cornhole with S.  Assisted in w/c to room and demonstrated squat pivot to R from armchair.  Patient performed squat pivot transfer to bed with mod A +2 for safety, increased time and assist to scoot hips back to middle of bed and pt sit to supine with mod A for both LE's, then repositioned in bed for comfort.  Left with bed alarm active and all needs in reach, TR therapist in the room.   Therapy Documentation Precautions:  Precautions Precautions: Fall Precaution Comments: WBAT all 4 extremeties as of 1/6, Unrestricted ROM B knees, L  ankle, B hips; PROM/AROM R wrist; colostomy; abdominal wound Required Braces or Orthoses: Splint/Cast Splint/Cast: no brace Restrictions Weight Bearing Restrictions: Yes RUE Weight Bearing: Weight bearing as tolerated LUE Weight Bearing: Weight bearing as tolerated LUE Partial Weight Bearing Percentage or Pounds: 50 RLE Weight Bearing: Weight bearing as tolerated LLE Weight Bearing: Weight bearing as tolerated LLE Partial Weight Bearing Percentage or Pounds: WBAT Pain: Pain Assessment Pain Scale: Faces Pain Score: 7  Faces Pain Scale: Hurts even more Pain Type: Acute pain Pain Location: Rib cage Pain Orientation: Right;Lateral Pain Descriptors / Indicators: Sore Pain Onset: With Activity Patients Stated Pain Goal: 4 Pain Intervention(s): Repositioned;Rest    Therapy/Group: Individual Therapy  Sara [name]  Sara [name], PT 01/04/2021, 4:39 PM

## 2021-01-04 NOTE — Progress Notes (Signed)
[facility]   Imaging of R wrist, R femur, L femur and L proximal tibia reviewed All fractures are stable, increased consolidation noted at all sites  WBAT ALL Extremities  Unrestricted ROM B UEx and B LLEx Continue with therapies for strength and conditioning, proprioception, etc  Mearl Latin, PA-C [phone] (C) 01/04/2021, 10:17 AM  [facility] [address] [place] [address] [phone] Collier Bullock (F)

## 2021-01-04 NOTE — Plan of Care (Signed)
  Problem: Consults Goal: RH GENERAL PATIENT EDUCATION Description: See Patient Education module for education specifics. Outcome: Progressing Goal: Skin Care Protocol Initiated - if Braden Score 18 or less Description: If consults are not indicated, leave blank or document N/A Outcome: Progressing Goal: Nutrition Consult-if indicated Outcome: Progressing   Problem: RH BOWEL ELIMINATION Goal: RH STG MANAGE BOWEL WITH ASSISTANCE Description: STG Manage Bowel with min Assistance. Outcome: Progressing   Problem: RH SKIN INTEGRITY Goal: RH STG SKIN FREE OF INFECTION/BREAKDOWN Description: Skin to remain free from breakdown and infection with min assist while on rehab. Outcome: Progressing Goal: RH STG MAINTAIN SKIN INTEGRITY WITH ASSISTANCE Description: STG Maintain Skin Integrity With min Assistance. Outcome: Progressing Goal: RH STG ABLE TO PERFORM INCISION/WOUND CARE W/ASSISTANCE Description: STG Able To Perform Incision/Wound Care With min Assistance. Outcome: Progressing   Problem: RH SAFETY Goal: RH STG ADHERE TO SAFETY PRECAUTIONS W/ASSISTANCE/DEVICE Description: STG Adhere to Safety Precautions With min Assistance/Device. Outcome: Progressing Goal: RH STG DECREASED RISK OF FALL WITH ASSISTANCE Description: STG Decreased Risk of Fall With min Assistance. Outcome: Progressing   Problem: RH PAIN MANAGEMENT Goal: RH STG PAIN MANAGED AT OR BELOW PT'S PAIN GOAL Description: <4 on a 0-10 pain scale Outcome: Progressing   Problem: RH KNOWLEDGE DEFICIT GENERAL Goal: RH STG INCREASE KNOWLEDGE OF [facility] CARE AFTER HOSPITALIZATION Description: Patient will demonstrate knowledge of medication management, nutritional supplement, ostomy management, weight bearing precautions with educational materials and handouts with min assist from staff. Outcome: Progressing   
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
## 2021-01-04 NOTE — Progress Notes (Signed)
Patient ID: Sara [name], female   DOB: 04/22/1966, 55 y.o.   MRN: [id number]  Patient transitioned to inpatient CIR rehabilitation unit.    Medical records reviewed, discussed with care team and then this  NP visited patient at the bedside as a follow up to for palliative medicine needs and emotional support.  She is in bed, appears to be sleeping.  She easily arouses to her name. Sara [name] tells me that she is been "working with therapy" and feels as if "she is a little better".  I shared with Sara [name] my concern with her motivation and progress.    I acknowledged how difficult this has been for her.   Created space and opportunity for patient to explore her thoughts and feelings regarding current medical situation.   She tells me that she is trying as hard as she can.    We discussed that likely it will be soon for the next transition of care.  Sara [name] tells me that she will be going home and her son will be helping to care for her.  Education offered on the importance of conversation and documentation of healthcare power of attorney and advanced care planning documents.  MOST form introduced.  Recommended that family meet at the bedside for continued conversation regarding these important topics.  Sara [name] agrees.  I left a message for daughter and await callback to schedule a family meeting.  Ongoing education regarding the importance of  personal responsibility in treatment plan.  Again introduced mind, body, spirit and positive talk/ affirmations.   Questions and concerns addressed     Spoke to daughter Sara [name] by phone, plan is to meet next week with Rehab treatment team.    PMT will continue to support holistically    Recommend outpatient community-based palliative on discharge.  Total time spent on the unit was 35 minutes  Greater than 50% of the time was spent in counseling and coordination of care  [name] NP  Palliative Medicine Team Team Phone # [phone] Pager [phone]

## 2021-01-05 ENCOUNTER — Inpatient Hospital Stay (HOSPITAL_COMMUNITY): Payer: Medicaid Other

## 2021-01-05 ENCOUNTER — Encounter (HOSPITAL_COMMUNITY): Payer: Medicaid Other | Admitting: Occupational Therapy

## 2021-01-05 ENCOUNTER — Ambulatory Visit (HOSPITAL_COMMUNITY): Payer: Medicaid Other

## 2021-01-05 DIAGNOSIS — G7281 Critical illness myopathy: Secondary | ICD-10-CM | POA: Diagnosis not present

## 2021-01-05 NOTE — Consult Note (Signed)
......  WOC Nurse ostomy follow up Patient receiving care in [facility] [phone]. Complete teaching session with patient's sister Sara [name] performed. During this session Sara [name] fully participated in ostomy care both with the existing pouch, and placement of a totally new pouching system. Sara [name], emptied the existing pouch and cleaned the tail. She removed the existing pouch, cleaned around the stoma, and observed placement of a strip of Aquacel into the MCS pocket.  The MCS pocket is now 2.2 cm deep and is located at the 4 o'clock position.  She also observed placement of Aquacel over the former retention suture area wound, and topping with barrier ring.  This area is filling in and healing with epithelial tissue.  We discussed that by the time [name] is discharged these areas may be completely healed and these two steps may not be necessary. Sara [name], was shown how to use a pattern to cut the new skin barrier, and she cut the new one, placed the barrier ring around the stoma, placed the skin barrier and attached the pouch. She also knows how to open and close the pouch, and when to empty the pouch.  We then turned our attention to the superficial abdominal wound.  It continues to have re-epithelialization over a 100% pink wound. Some of the absorbable sutures had migrated out.  Others continue that migration out process.  Sara [name] and I discussed this also.  She was shown how to very gently dab the wound with water to clean it, then to place a Xeroform gauze over the wound bed and top with a foam dressing.  All of Sara [name]'s questions were answered to her expressed satisfaction.  The [employer] package arrived last week.  I encouraged Sara [name] to follow the instructions on the letter in the box to get [name] connected fully with the [employer]. She agreed to do so.     Helmut Muster, RN, MSN, CWOCN, CNS-BC, pager [phone]

## 2021-01-05 NOTE — Progress Notes (Signed)
Physical Therapy Session Note  Patient Details  Name: Sara [name] MRN: [id number] Date of Birth: August 15, 1966  Today's Date: 01/05/2021 PT Individual Time: 1415-1533 PT Individual Time Calculation (min): 78 min   Short Term Goals: Week 2:  PT Short Term Goal 1 (Week 2): Patient will perform bed <>chair transfers with mod A of 1 PT Short Term Goal 1 - Progress (Week 2): Met PT Short Term Goal 2 (Week 2): Patient will initiate gait with LRAD as tolerated and +2 A for safety PT Short Term Goal 2 - Progress (Week 2): Progressing toward goal PT Short Term Goal 3 (Week 2): Patient to transition to w/c able to propel with min A x 100' PT Short Term Goal 3 - Progress (Week 2): Met PT Short Term Goal 4 (Week 2): Paient sit to stand max A of 1 to appropriate AD. PT Short Term Goal 4 - Progress (Week 2): Progressing toward goal Week 3:  PT Short Term Goal 1 (Week 3): Patient to perform supine to sit with min A. PT Short Term Goal 2 (Week 3): Patient to perform sit to stand max A of 1 to appropriate AD. PT Short Term Goal 3 (Week 3): Patient to direct w/c set up for transfer then for propulsion with S. PT Short Term Goal 4 (Week 3): Patient to initiate gait training. PT Short Term Goal 5 (Week 3): Patient to perform car transfers with mod A of 2.  Skilled Therapeutic Interventions/Progress Updates:   Received pt supine in bed, pt agreeable to therapy, and reported pain 9/10 in LLE. RN made aware and present to administer pain medication during session. Repositioning, rest breaks, and distraction done to reduce pain levels. Session with emphasis on functional mobility/transfers, generalized strengthening, dynamic sitting balance/coordination, and improved activity tolerance. Pt transferred supine<>sitting EOB with mod A for LE management and for trunk control. Pt initially c/o dizziness but symptoms resolved within 2-3 minutes. Scooted to EOB with max A and emptied ostomy bag with set up assist. Pt able  to instruct therapist exactly how to clean it and what supplies she needed. Pt transferred bed<>WC via slideboard with mod A +2 and total A to place board with 8in step underneath LEs. Pt unable to flex knees due to pain making transfer more difficult. Pt transported to dayroom in [facility] total A and performed the following exercises seated in [facility] with supervision and verbal cues for technique: -overhead shoulder press with 3lb dowel 2x8 -horizontal chest press at 90 degrees with 3lb dowel 2x8 -single arm bicep curls with 4lb dumbbell 2x10 bilaterally  -pronation/supination with extended elbows "driving the car" with 3lb dowel 2x20 -trunk rotations with 4.4lb medicine ball 2x10 bilaterally  -WC pushups 2x10 Worked on dynamic sitting balance and trunk control batting ball with 3lb dowel 3x20. Pt transported back to room in [facility] total A and transferred WC<>bed via slideboard with mod A +2 using same technique mentioned above. Pt transferred sit<>supine with mod A and scooted to [facility] with +2 assist. Concluded session with pt supine in bed, needs within reach, and bed alarm on.   Therapy Documentation Precautions:  Precautions Precautions: Fall Precaution Comments: WBAT all 4 extremeties as of 1/6, Unrestricted ROM B knees, L ankle, B hips; PROM/AROM R wrist; colostomy; abdominal wound Required Braces or Orthoses: Splint/Cast Splint/Cast: no brace Restrictions Weight Bearing Restrictions: Yes RUE Weight Bearing: Weight bearing as tolerated LUE Weight Bearing: Weight bearing as tolerated LUE Partial Weight Bearing Percentage or Pounds: 50% RLE Weight Bearing:  Weight bearing as tolerated LLE Weight Bearing: Weight bearing as tolerated LLE Partial Weight Bearing Percentage or Pounds: 50%   Therapy/Group: Individual Therapy Alfonse Alpers PT, DPT   01/05/2021, 7:27 AM

## 2021-01-05 NOTE — Progress Notes (Signed)
Occupational Therapy Session Note  Patient Details  Name: Sara [name] MRN: [id number] Date of Birth: 12-Oct-1966  Today's Date: 01/05/2021 OT Individual Time: 1000-1100 OT Individual Time Calculation (min): 60 min    Short Term Goals: Week 4:  OT Short Term Goal 1 (Week 4): Pt will perform LB dress at bed level with Mod A OT Short Term Goal 2 (Week 4): Pt will perform BSC transfer w/ LRAD with Max A of 1 OT Short Term Goal 3 (Week 4): Pt will perform UB/LB bathing with AE PRN Min A to decrease caregiver burden  Skilled Therapeutic Interventions/Progress Updates:    Treatment session with focus on initiating family education.  Pt's sister, Alvino Chapel, present during session and asking appropriate questions.  Pt reports "excruciating" pain in LLE and initially declining any functional activity.  Pt ultimately agreeable to engaging in Mascio-care tasks at bed level.  Therapist provided pt with long handled sponge, pt able to demonstrate use to simulate LB bathing still requiring assistance when washing feed.  Therapist educated pt and sister on use of reacher for LB dressing with pt assisting when reaching with reacher towards feet but unable to thread due to pain in BLE and difficulty lifting legs to thread pant legs.  Pt engaged in rolling both directions to allow therapist to pull pants over hips.  Pt's sister provided assistance with rolling and verbalized understanding as therapist educated on body mechanics during rolling and use of hospital bed functions to increase safety and participation of pt and safety of caregiver.  Pt donned pullover bra and shirt with min assist to pull down over back.  Pt refused any sitting EOB or getting OOB this session, but agreeable to attempting when PT arrives.  Pt remained semi-reclined in bed with sister providing care to feet.   Therapy Documentation Precautions:  Precautions Precautions: Fall Precaution Comments: WBAT all 4 extremeties as of 1/6, Unrestricted  ROM B knees, L ankle, B hips; PROM/AROM R wrist; colostomy; abdominal wound Required Braces or Orthoses: Splint/Cast Splint/Cast: no brace Restrictions Weight Bearing Restrictions: Yes RUE Weight Bearing: Weight bearing as tolerated LUE Weight Bearing: Weight bearing as tolerated LUE Partial Weight Bearing Percentage or Pounds: 50% RLE Weight Bearing: Weight bearing as tolerated LLE Weight Bearing: Weight bearing as tolerated LLE Partial Weight Bearing Percentage or Pounds: 50% Pain:  Pt with c/o pain in LLE, not rated.   Therapy/Group: Individual Therapy  Rosalio Loud 01/05/2021, 12:35 PM

## 2021-01-05 NOTE — Progress Notes (Signed)
Physical Therapy Session Note  Patient Details  Name: Sara [name] MRN: [id number] Date of Birth: 03/26/1966  Today's Date: 01/04/2021 PT Individual Time: 1630-1720   50 min   Short Term Goals: Week 3:  PT Short Term Goal 1 (Week 3): Patient to perform supine to sit with min A. PT Short Term Goal 2 (Week 3): Patient to perform sit to stand max A of 1 to appropriate AD. PT Short Term Goal 3 (Week 3): Patient to direct w/c set up for transfer then for propulsion with S. PT Short Term Goal 4 (Week 3): Patient to initiate gait training. PT Short Term Goal 5 (Week 3): Patient to perform car transfers with mod A of 2.  Skilled Therapeutic Interventions/Progress Updates:   Pt received supine in bed and agreeable to PT with encouragement. Bed level AAROM hip/knee flexion/extension 2 x 10 BLE. Heel cord sustained stretch 2 x 1 min BLE. Supine>sit EOB with mod assist from PT for LE management and cues for use of bed rails. Once EOB pt declines transfer to WC. EOB sitting balance performed without assist and 8 inch step under feet. AAROM knee flexion/extension 3 x 10 with increasing ROM in flexion in BLE. Hip abduction 2x10 BLE. Hip flexion x8 BLE with AAROM. Pt requesting to return to supine due to rib and knee pain, reporting the pain meds are not due until after PT.  Sit>supine completed with mod assist for BLE management, and left supine in bed with call bell in reach and all needs met.         Therapy Documentation Precautions:  Precautions Precautions: Fall Precaution Comments: WBAT all 4 extremeties as of 1/6, Unrestricted ROM B knees, L ankle, B hips; PROM/AROM R wrist; colostomy; abdominal wound Required Braces or Orthoses: Splint/Cast Splint/Cast: no brace Restrictions Weight Bearing Restrictions: Yes RUE Weight Bearing: Weight bearing as tolerated LUE Weight Bearing: Weight bearing as tolerated LUE Partial Weight Bearing Percentage or Pounds: 50% RLE Weight Bearing: Weight  bearing as tolerated LLE Weight Bearing: Weight bearing as tolerated LLE Partial Weight Bearing Percentage or Pounds: 50%    Vital Signs: Therapy Vitals Temp: 99.7 F (37.6 C) Temp Source: Oral Pulse Rate: 73 Resp: 18 BP: (!) 101/59 Patient Position (if appropriate): Lying Oxygen Therapy SpO2: 97 % O2 Device: Room Air Pain: 8/10 ribs, BLE, torso. Pt repositioned.   Therapy/Group: Individual Therapy  Lorie Phenix 01/05/2021, 7:55 AM
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
## 2021-01-05 NOTE — Plan of Care (Signed)
  Problem: RH Balance Goal: LTG Patient will maintain dynamic standing balance (PT) Description: LTG:  Patient will maintain dynamic standing balance with assistance during mobility activities (PT) Outcome: Not Applicable Flowsheets (Taken 01/05/2021 1637) LTG: Pt will maintain dynamic standing balance during mobility activities with:: (goal discontinued due to not tolerating standing) --   Problem: Sit to Stand Goal: LTG:  Patient will perform sit to stand with assistance level (PT) Description: LTG:  Patient will perform sit to stand with assistance level (PT) Flowsheets (Taken 01/05/2021 1637) LTG: PT will perform sit to stand in preparation for functional mobility with assistance level: Maximal Assistance - Patient 25 - 49%   Problem: RH Bed Mobility Goal: LTG Patient will perform bed mobility with assist (PT) Description: LTG: Patient will perform bed mobility with assistance, with/without cues (PT). Flowsheets (Taken 01/05/2021 1637) LTG: Pt will perform bed mobility with assistance level of: Minimal Assistance - Patient > 75%   Problem: RH Bed to Chair Transfers Goal: LTG Patient will perform bed/chair transfers w/assist (PT) Description: LTG: Patient will perform bed to chair transfers with assistance (PT). Flowsheets (Taken 01/05/2021 1637) LTG: Pt will perform Bed to Chair Transfers with assistance level: Moderate Assistance - Patient 50 - 74%   Problem: RH Car Transfers Goal: LTG Patient will perform car transfers with assist (PT) Description: LTG: Patient will perform car transfers with assistance (PT). Flowsheets (Taken 01/05/2021 1637) LTG: Pt will perform car transfers with assist:: 2 Helpers   Problem: RH Ambulation Goal: LTG Patient will ambulate in controlled environment (PT) Description: LTG: Patient will ambulate in a controlled environment, # of feet with assistance (PT). Outcome: Not Applicable Flowsheets (Taken 01/05/2021 1637) LTG: Pt will ambulate in controlled  environ  assist needed:: (discontinued due to not tolerating standing) --   Problem: RH Ambulation Goal: LTG Patient will ambulate in home environment (PT) Description: LTG: Patient will ambulate in home environment, # of feet with assistance (PT). Outcome: Not Applicable Flowsheets (Taken 01/05/2021 1637) LTG: Pt will ambulate in home environ  assist needed:: (discontinued due to not tolerating standing) --  Goals downgraded due to limited progress due to pain in LE's treatment limited by frequent illness. [name], PT

## 2021-01-05 NOTE — Progress Notes (Signed)
Physical Therapy Weekly Progress Note  Patient Details  Name: Sara [name] MRN: [id number] Date of Birth: 06-28-1966  Beginning of progress report period: December 29, 2020 End of progress report period: January 05, 2021  Today's Date: 01/05/2021 PT Individual Time: 1100-1200 PT Individual Time Calculation (min): 60 min   Patient has met 0 of 5 short term goals.  Patient did not allow much standing due to LE pain which limited progress towards both transfer goal and for initiating gait.  She still needs assistance to manage LE's for bed mobility, though improving some, and did not attempt car transfers this week.  Her sister was present for therapy today and plans to get sons involved to be trained to assist pt at d/c so should be able to initiate transfers next week.  Goals downgraded this date to reflect limited progress in standing and transfers, though overall, pt participating better and more engaged.   Patient continues to demonstrate the following deficits muscle weakness, muscle joint tightness and pain and therefore will continue to benefit from skilled PT intervention to increase functional independence with mobility.  Patient not progressing toward long term goals.  See goal revision..  Continue plan of care.  PT Short Term Goals Week 3:  PT Short Term Goal 1 (Week 3): Patient to perform supine to sit with min A. PT Short Term Goal 1 - Progress (Week 3): Not met PT Short Term Goal 2 (Week 3): Patient to perform sit to stand max A of 1 to appropriate AD. PT Short Term Goal 2 - Progress (Week 3): Not met PT Short Term Goal 3 (Week 3): Patient to direct w/c set up for transfer then for propulsion with S. PT Short Term Goal 3 - Progress (Week 3): Progressing toward goal PT Short Term Goal 4 (Week 3): Patient to initiate gait training. PT Short Term Goal 4 - Progress (Week 3): Not met PT Short Term Goal 5 (Week 3): Patient to perform car transfers with mod A of 2. PT Short Term Goal  5 - Progress (Week 3): Not met Week 4:  PT Short Term Goal 1 (Week 4): Patient to perform squat pivot transfer with mod A. PT Short Term Goal 2 (Week 4): Patient to perform supine to sit with min A using rails and leg lifter PT Short Term Goal 3 (Week 4): Patient to initiate car transfer training PT Short Term Goal 4 (Week 4): Caregivers to demonstrate safe bed<>chair transfer with S.  Skilled Therapeutic Interventions/Progress Updates:  Ambulation/gait training;Balance/vestibular training;DME/adaptive equipment instruction;Functional electrical stimulation;Functional mobility training;Neuromuscular re-education;Pain management;Patient/family education;Psychosocial support;Skin care/wound management;Splinting/orthotics;Stair training;Therapeutic Activities;Therapeutic Exercise;UE/LE Strength taining/ROM;Wheelchair propulsion/positioning   Patient in supine with sister in the room cleaning and refreshing room and pt's feet.  Patient needing to I/O cathed per nursing so planned to assist up to [facility], but pt requesting BSC with more support on sides to prevent her from sinking and having pain from edge of toilet digging in.  Located Bariatric drop arm BSC and brought to room.  Sister emptied ostomy bag unaided.  Patient rolled with S for max A to doff brief and pants.  Patient supine to sit with mod A using rails and assist for scooting hips.  Patient initiated squat pivot to [facility], but unable to get up enough on her feet so used bed pad to slide her over.  Sister assisted to hold [facility] for safety.  Patient leaning and A to remove bed pad.  Toileted with feet supported on stool.  Patient performed hygiene while seated.  Leaning and assist to place new bed pad under pt to aide in transfer.  Patient scooted to edge of BSC for feet on the floor with mod A.  Lifting help for squat pivot to bed max A.  Patient assisted to unweight and scoot R hip back onto bed with mod A.  Sit to supine mod A for LE's.  S to roll to  place clean brief and total A to don pants in supine, pt rolling with S using rails.  Sister assisting as well to don pants.  Patient scooting up to [facility] with mod A using rails.  Patient left in bed with call bell and needs in reach, sister in the room and bed alarm active.    Therapy Documentation Precautions:  Precautions Precautions: Fall Precaution Comments: WBAT all 4 extremeties as of 1/6, Unrestricted ROM B knees, L ankle, B hips; PROM/AROM R wrist; colostomy; abdominal wound Required Braces or Orthoses: Splint/Cast Splint/Cast: no brace Restrictions Weight Bearing Restrictions: No RUE Weight Bearing: Weight bearing as tolerated LUE Weight Bearing: Weight bearing as tolerated LUE Partial Weight Bearing Percentage or Pounds: 50% RLE Weight Bearing: Weight bearing as tolerated LLE Weight Bearing: Weight bearing as tolerated LLE Partial Weight Bearing Percentage or Pounds: 50% Pain: Pain Assessment Pain Scale: 0-10 Pain Score: 7  Faces Pain Scale: Hurts even more Pain Type: Acute pain Pain Location: Leg Pain Orientation: Left Pain Descriptors / Indicators: Grimacing;Guarding Pain Onset: On-going Pain Intervention(s): Repositioned;Rest     Therapy/Group: Individual Therapy  Reginia Naas  Magda Kiel, PT 01/05/2021, 4:34 PM

## 2021-01-05 NOTE — Progress Notes (Signed)
This chaplain is present with the Pt., Pt.sister-Kwame Ryland and PT session closing for F/U spiritual care.  This is the chaplain's first visit with the Pt. sister at the bedside. The chaplain offers F/U spiritual care and a pastoral presence to the Pt. and the family.    The chaplain listens as the Pt. sister recognizes God's past and future presence in the Pt.  healthcare journey.  The chaplain accepted the request for prayer and recognized the significance of the Pt. pain today.

## 2021-01-06 DIAGNOSIS — G7281 Critical illness myopathy: Secondary | ICD-10-CM | POA: Diagnosis not present
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
## 2021-01-08 ENCOUNTER — Inpatient Hospital Stay (HOSPITAL_COMMUNITY): Payer: Medicaid Other

## 2021-01-08 ENCOUNTER — Inpatient Hospital Stay (HOSPITAL_COMMUNITY): Payer: Medicaid Other | Admitting: Physical Therapy

## 2021-01-08 ENCOUNTER — Inpatient Hospital Stay (HOSPITAL_COMMUNITY): Payer: Medicaid Other | Admitting: Occupational Therapy

## 2021-01-08 DIAGNOSIS — G7281 Critical illness myopathy: Secondary | ICD-10-CM | POA: Diagnosis not present

## 2021-01-08 LAB — CBC WITH DIFFERENTIAL/PLATELET
Abs Immature Granulocytes: 0.04 10*3/uL (ref 0.00–0.07)
Basophils Absolute: 0.1 10*3/uL (ref 0.0–0.1)
Basophils Relative: 1 %
Eosinophils Absolute: 0.4 10*3/uL (ref 0.0–0.5)
Eosinophils Relative: 6 %
HCT: 33.6 % — ABNORMAL LOW (ref 36.0–46.0)
Hemoglobin: 10.5 g/dL — ABNORMAL LOW (ref 12.0–15.0)
Immature Granulocytes: 1 %
Lymphocytes Relative: 29 %
Lymphs Abs: 1.8 10*3/uL (ref 0.7–4.0)
MCH: 30 pg (ref 26.0–34.0)
MCHC: 31.3 g/dL (ref 30.0–36.0)
MCV: 96 fL (ref 80.0–100.0)
Monocytes Absolute: 0.6 10*3/uL (ref 0.1–1.0)
Monocytes Relative: 9 %
Neutro Abs: 3.3 10*3/uL (ref 1.7–7.7)
Neutrophils Relative %: 54 %
Platelets: 355 10*3/uL (ref 150–400)
RBC: 3.5 MIL/uL — ABNORMAL LOW (ref 3.87–5.11)
RDW: 13.4 % (ref 11.5–15.5)
WBC: 6.2 10*3/uL (ref 4.0–10.5)
nRBC: 0 % (ref 0.0–0.2)

## 2021-01-08 LAB — BASIC METABOLIC PANEL
Anion gap: 11 (ref 5–15)
BUN: 11 mg/dL (ref 6–20)
CO2: 22 mmol/L (ref 22–32)
Calcium: 9.8 mg/dL (ref 8.9–10.3)
Chloride: 106 mmol/L (ref 98–111)
Creatinine, Ser: 0.75 mg/dL (ref 0.44–1.00)
GFR, Estimated: >60 mL/min (ref >60)
Glucose, Bld: 89 mg/dL (ref 70–99)
Potassium: 3.8 mmol/L (ref 3.5–5.1)
Sodium: 139 mmol/L (ref 135–145)

## 2021-01-08 LAB — MAGNESIUM: Magnesium: 1.6 mg/dL — ABNORMAL LOW (ref 1.7–2.4)

## 2021-01-08 MED ORDER — MAGNESIUM OXIDE 400 (241.3 MG) MG PO TABS
800.0000 mg | ORAL_TABLET | Freq: Two times a day (BID) | ORAL | Status: DC
  Administered 2021-01-08 – 2021-01-17 (×16): 800 mg via ORAL
  Filled 2021-01-08 (×18): qty 2

## 2021-01-08 NOTE — Progress Notes (Signed)
Occupational Therapy Session Note  Patient Details  Name: Sara [name] MRN: [id number] Date of Birth: 10-25-1966  Today's Date: 01/08/2021 OT Individual Time: 8588-5027 and 7412-8786 OT Individual Time Calculation (min): 55 min and 59 min   Short Term Goals: Week 4:  OT Short Term Goal 1 (Week 4): Pt will perform LB dress at bed level with Mod A OT Short Term Goal 2 (Week 4): Pt will perform BSC transfer w/ LRAD with Max A of 1 OT Short Term Goal 3 (Week 4): Pt will perform UB/LB bathing with AE PRN Min A to decrease caregiver burden   Skilled Therapeutic Interventions/Progress Updates:    Pt greeted at time of session supine in bed resting, in standard hospital bed now and no longer in [place]. Some knee discomfort with movement but no pain # provided. Pt educated on how this bed will be a better transition to hospital bed at home. Bed level LB dressing with pants using reacher, Max/total A to don with pt rolling L/R with Min A for therapist to bring pants over hips. Supine to sit Mod A and pt able to lower to L elbow for therapist to place slide board, transferred to her R side to wheelchair with Max/total A of 1 with support of block under feet. Set up at sink and performed UB bathing with Set up/supervision, declined washing LB today as she had pants on already. Donned gown with supervision excluding tying in back. Discussion/education throughout session with pt regarding recommendations for sequencing at home for ADLs to decrease caregiver burden. Discussed DME needs as well and recommendation to purchase bariatric drop arm for ease at home. Pt up in wheelchair with call bell in reach all needs met.   Session 2: Pt greeted at time of session supine in bed sleeping but easily woken and agreeable to OT session with min encouragement. Agreeable to trying to use bariatric drop arm BSC today. Supine to sit Mod A and once sitting EOB pt with some dizziness that subsided after approx 1 minute. Able  to lateral lean onto L elbow for therapist to place board, slide board with pants on to [facility] with Max A (2nd helper for safety) and once on commode, total A to doff brief and pants. Note pt was able to help more today with lifting buttocks slightly for doffing clothing. (+) urine in [facility], RN and NT aware. Placed chuck under pt with lateral lean L/R and slide board back to bed Max A with 2nd helper for safety. Sit to supine Mod, rolling L/R for brief change and encouraging pt to lace up brief with HOB elevated to improve her reach. Declined emptying colostomy bag at this time. Scoot up in bed Min A with boost feature. Call bell in reach alarm on.    Therapy Documentation Precautions:  Precautions Precautions: Fall Precaution Comments: WBAT all 4 extremeties as of 1/6, Unrestricted ROM B knees, L ankle, B hips; PROM/AROM R wrist; colostomy; abdominal wound Required Braces or Orthoses: Splint/Cast Splint/Cast: no brace Restrictions Weight Bearing Restrictions: Yes RUE Weight Bearing: Weight bearing as tolerated LUE Weight Bearing: Weight bearing as tolerated LUE Partial Weight Bearing Percentage or Pounds: 50% RLE Weight Bearing: Weight bearing as tolerated LLE Weight Bearing: Weight bearing as tolerated LLE Partial Weight Bearing Percentage or Pounds: 50%     Therapy/Group: Individual Therapy  Viona Gilmore 01/08/2021, 10:22 AM
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
## 2021-01-08 NOTE — Progress Notes (Signed)
Physical Therapy Session Note  Patient Details  Name: Sara [name] MRN: [id number] Date of Birth: [birth year]  Today's Date: 01/08/2021 PT Individual Time: 1130-1200; 1300-1345 PT Individual Time Calculation (min): 30 min & 45 min  Short Term Goals: Week 4:  PT Short Term Goal 1 (Week 4): Patient to perform squat pivot transfer with mod A. PT Short Term Goal 2 (Week 4): Patient to perform supine to sit with min A using rails and leg lifter PT Short Term Goal 3 (Week 4): Patient to initiate car transfer training PT Short Term Goal 4 (Week 4): Caregivers to demonstrate safe bed<>chair transfer with S.  Skilled Therapeutic Interventions/Progress Updates:    Session 1:  Patient in w/c reports up since OT at 9:30.  Propelled w/c x 200' with S.  Discussed options for car transfers, daughter has a Zenaida Niece, son in law has a [employer] farm manager.  Discussed back seat drivers side of sedan and front seat of van.  Patient performed slide board transfer to 27" height seat with +2 mod A and using step stool.  Patient assisted back to room in w/c and assisted to bed via slide board transfer with min to mod A.  Sit to supine mod A.  Left in supine with needs in reach and bed alarm active.   Session 2:Patient in supine. Performed supine therex for LE's AAROM knee flexion x 10 each leg, ankle pumps x 20, hip abduction x 10 each leg.  Supine to sit with mod A for scooting hips and supporting trunk.  Patient performed attempts at sit to stand x 3 to RW with max A.  Able to get up partially on third attempt, but not fully standing, and most weight over R LE.  Patient performed slide board transfer to w/c with min to mod A.  Pushed in w/c to therapy gym and performed arm bike level 1.5 2 min forward and 2 min back.  Seated in w/c no back support for core strength with weighted ball touching hip to shoulder, then in/out then up/down x 5 each.  Patient assisted to room and transferred to bed via slide board with mod A.  Sit to supine mod  A and positioned with call bell in reach, needs in reach and bed alarm active.   Therapy Documentation Precautions:  Precautions Precautions: Fall Precaution Comments: WBAT all 4 extremeties as of 1/6, Unrestricted ROM B knees, L ankle, B hips; PROM/AROM R wrist; colostomy; abdominal wound Required Braces or Orthoses: Splint/Cast Splint/Cast: no brace Restrictions Weight Bearing Restrictions: Yes RUE Weight Bearing: Weight bearing as tolerated LUE Weight Bearing: Weight bearing as tolerated LUE Partial Weight Bearing Percentage or Pounds: 50% RLE Weight Bearing: Weight bearing as tolerated LLE Weight Bearing: Weight bearing as tolerated LLE Partial Weight Bearing Percentage or Pounds: 50% Pain: Pain Assessment Pain Scale: 0-10 Pain Score: 8  Faces Pain Scale: Hurts even more Pain Type: Acute pain Pain Location: Leg Pain Orientation: Left Pain Descriptors / Indicators: Aching;Throbbing Pain Frequency: Constant Pain Onset: On-going Patients Stated Pain Goal: 4 Pain Intervention(s): Elevated extremity;Repositioned   Therapy/Group: Individual Therapy  [name]  [name], PT 01/08/2021, 12:35 PM

## 2021-01-09 ENCOUNTER — Inpatient Hospital Stay (HOSPITAL_COMMUNITY): Payer: Medicaid Other

## 2021-01-09 ENCOUNTER — Inpatient Hospital Stay (HOSPITAL_COMMUNITY): Payer: Medicaid Other | Admitting: Physical Therapy

## 2021-01-09 ENCOUNTER — Inpatient Hospital Stay (HOSPITAL_COMMUNITY): Payer: Medicaid Other | Admitting: Occupational Therapy

## 2021-01-09 DIAGNOSIS — G7281 Critical illness myopathy: Secondary | ICD-10-CM | POA: Diagnosis not present

## 2021-01-09 MED ORDER — LIDOCAINE 5 % EX PTCH
2.0000 | MEDICATED_PATCH | Freq: Every day | CUTANEOUS | Status: DC
  Administered 2021-01-09 – 2021-01-10 (×2): 2 via TRANSDERMAL
  Filled 2021-01-09 (×2): qty 2

## 2021-01-09 NOTE — Progress Notes (Signed)
Physical Therapy Session Note  Patient Details  Name: Sara [name] MRN: [id number] Date of Birth: 1966-08-21  Today's Date: 01/09/2021 PT Individual Time: 1884-1660 PT Individual Time Calculation (min): 30 min   Short Term Goals: Week 4:  PT Short Term Goal 1 (Week 4): Patient to perform squat pivot transfer with mod A. PT Short Term Goal 2 (Week 4): Patient to perform supine to sit with min A using rails and leg lifter PT Short Term Goal 3 (Week 4): Patient to initiate car transfer training PT Short Term Goal 4 (Week 4): Caregivers to demonstrate safe bed<>chair transfer with S.  Skilled Therapeutic Interventions/Progress Updates:    Patient in supine, had to have ostomy changed due to leaking per pt.  Performed therex in bed as follows: heel slides, hip abduction, SAQ, hip adductor squeezes w/ 5 sec hold, bridging with L LE on pillow and R knee bent with foot on the bed with assist and 5 sec hold, with orange t-band bicep curls, tricep extensions and high rows all x 10.  Modified sit ups with HOB up and with A arms crossed over chest x 10.  While sitting up pt brushed her hair.  Patient in supine with bed in trendelenberg to scoot up to [facility].  Placed heating pad on L LE per pt request and left with needs and call bell in reach.  Therapy Documentation Precautions:  Precautions Precautions: Fall Precaution Comments: WBAT all 4 extremeties as of 1/6, Unrestricted ROM B knees, L ankle, B hips; PROM/AROM R wrist; colostomy; abdominal wound Required Braces or Orthoses: Splint/Cast Splint/Cast: no brace Restrictions Weight Bearing Restrictions: Yes RUE Weight Bearing: Weight bearing as tolerated LUE Weight Bearing: Weight bearing as tolerated LUE Partial Weight Bearing Percentage or Pounds: 50% RLE Weight Bearing: Weight bearing as tolerated LLE Weight Bearing: Weight bearing as tolerated LLE Partial Weight Bearing Percentage or Pounds: 50% Pain: Pain Assessment Pain Scale:  Faces Faces Pain Scale: Hurts even more Pain Type: Acute pain Pain Location: Leg Pain Orientation: Left Pain Descriptors / Indicators: Aching Pain Onset: On-going Pain Intervention(s): Heat applied    Therapy/Group: Individual Therapy  [name]  [name], PT 01/09/2021, 4:40 PM

## 2021-01-09 NOTE — Progress Notes (Signed)
Physical Therapy Session Note  Patient Details  Name: Sara [name] MRN: [id number] Date of Birth: 02-18-1966  Today's Date: 01/09/2021 PT Individual Time: 1001-1100 PT Individual Time Calculation (min): 59 min   Short Term Goals: Week 1:  PT Short Term Goal 1 (Week 1): Pt will perform supine<>sit with max assist of 1 PT Short Term Goal 1 - Progress (Week 1): Met PT Short Term Goal 2 (Week 1): Pt will perform bed<>chair transfers using LRAD with max assist of 1 and +2 min assist PT Short Term Goal 2 - Progress (Week 1): Met PT Short Term Goal 3 (Week 1): Pt will perform sit<>stands using equipment as needed with +2 max assist PT Short Term Goal 3 - Progress (Week 1): Met PT Short Term Goal 4 (Week 1): Pt will tolerate sitting upright, OOB in TIS w/c for at least 3 total hours a day PT Short Term Goal 4 - Progress (Week 1): Progressing toward goal Week 2:  PT Short Term Goal 1 (Week 2): Patient will perform bed <>chair transfers with mod A of 1 PT Short Term Goal 1 - Progress (Week 2): Met PT Short Term Goal 2 (Week 2): Patient will initiate gait with LRAD as tolerated and +2 A for safety PT Short Term Goal 2 - Progress (Week 2): Progressing toward goal PT Short Term Goal 3 (Week 2): Patient to transition to w/c able to propel with min A x 100' PT Short Term Goal 3 - Progress (Week 2): Met PT Short Term Goal 4 (Week 2): Paient sit to stand max A of 1 to appropriate AD. PT Short Term Goal 4 - Progress (Week 2): Progressing toward goal Week 3:  PT Short Term Goal 1 (Week 3): Patient to perform supine to sit with min A. PT Short Term Goal 1 - Progress (Week 3): Not met PT Short Term Goal 2 (Week 3): Patient to perform sit to stand max A of 1 to appropriate AD. PT Short Term Goal 2 - Progress (Week 3): Not met PT Short Term Goal 3 (Week 3): Patient to direct w/c set up for transfer then for propulsion with S. PT Short Term Goal 3 - Progress (Week 3): Progressing toward goal PT Short Term  Goal 4 (Week 3): Patient to initiate gait training. PT Short Term Goal 4 - Progress (Week 3): Not met PT Short Term Goal 5 (Week 3): Patient to perform car transfers with mod A of 2. PT Short Term Goal 5 - Progress (Week 3): Not met Week 4:  PT Short Term Goal 1 (Week 4): Patient to perform squat pivot transfer with mod A. PT Short Term Goal 2 (Week 4): Patient to perform supine to sit with min A using rails and leg lifter PT Short Term Goal 3 (Week 4): Patient to initiate car transfer training PT Short Term Goal 4 (Week 4): Caregivers to demonstrate safe bed<>chair transfer with S.  Skilled Therapeutic Interventions/Progress Updates:    pt received in bed and agreeable to therapy. Pt denied pain at start of session and reported "some" pain at end of session in L knee however reported improved comfort once in bed and pillows placed at end of session. Pt directed in supine>sit EOB min A and setup for slide board transfer to [facility] to pt's Rt side at mod A grossly. Pt taken to gym in [facility] total A for time and energy, pt then directed in x4 Sit to stand to Rolling walker max A with VC for  setup, technique to complete, pt improved with reps from unable to clear chair to 50% upright on final rep with inability to maintain over 30s. Pt then reported greatly increased fatigue and required prolonged rest break post this. Pt then directed in [facility] mobility to return to room, completed 125' and PT completed rest of distance to return to room 2/2 fatigue. Pt directed in slide board transfer to bed mod A with total A for slide board transfer. Pt left in bed, NCT present, All needs in reach and in good condition. Call light in hand.    Therapy Documentation Precautions:  Precautions Precautions: Fall Precaution Comments: WBAT all 4 extremeties as of 1/6, Unrestricted ROM B knees, L ankle, B hips; PROM/AROM R wrist; colostomy; abdominal wound Required Braces or Orthoses: Splint/Cast Splint/Cast: no  brace Restrictions Weight Bearing Restrictions: Yes RUE Weight Bearing: Weight bearing as tolerated LUE Weight Bearing: Weight bearing as tolerated LUE Partial Weight Bearing Percentage or Pounds: 50% RLE Weight Bearing: Weight bearing as tolerated LLE Weight Bearing: Weight bearing as tolerated LLE Partial Weight Bearing Percentage or Pounds: 50% General:   Vital Signs:   Pain:   Mobility:   Locomotion :    Trunk/Postural Assessment :    Balance:   Exercises:   Other Treatments:      Therapy/Group: Individual Therapy  Junie Panning 01/09/2021, 12:56 PM

## 2021-01-09 NOTE — Progress Notes (Signed)
Physical Therapy Session Note  Patient Details  Name: Sara [name] MRN: [id number] Date of Birth: 21-Aug-1966  Today's Date: 01/09/2021 PT Individual Time: 5462-7035 PT Individual Time Calculation (min): 31 min   Short Term Goals: Week 4:  PT Short Term Goal 1 (Week 4): Patient to perform squat pivot transfer with mod A. PT Short Term Goal 2 (Week 4): Patient to perform supine to sit with min A using rails and leg lifter PT Short Term Goal 3 (Week 4): Patient to initiate car transfer training PT Short Term Goal 4 (Week 4): Caregivers to demonstrate safe bed<>chair transfer with S.  Skilled Therapeutic Interventions/Progress Updates:    Pt received supine in bed and agreeable to therapy session though initially requesting to remain bed level due to L knee pain, with minimal encouragement agreeable to progression EOB. Pt using personal weighted blanket massager on L knee for pain management upon therapist entry. Supine>sitting R EOB, HOB slightly elevated and relying on bedrails for trunk support, with min assist for L LE management and trunk upright - cuing and increased time provided for increased pt independence. While sitting EOB required 8" step support under LEs as unable to tolerate BLEs (primarily L LE) hanging off bed with passive stretch into knee flexion. Sitting EOB performed the following B UE and trunk control task in circuit style: - bicep curl using 4lb weight 2x12 reps - challenging to pt's core stability requiring UE support to maintain balance - scapular retractions against level 2 theraband resistance 2x15 reps - elbow extension against level 2 theraband resistance 2x12 reps  Sit>supine with mod assist for BLE management into the bed. Pt left supine in bed with HOB elevated, bed alarm on, and meal tray set-up.   Therapy Documentation Precautions:  Precautions Precautions: Fall Precaution Comments: WBAT all 4 extremeties as of 1/6, Unrestricted ROM B knees, L ankle, B  hips; PROM/AROM R wrist; colostomy; abdominal wound Required Braces or Orthoses: Splint/Cast Splint/Cast: no brace Restrictions Weight Bearing Restrictions: Yes RUE Weight Bearing: Weight bearing as tolerated LUE Weight Bearing: Weight bearing as tolerated LUE Partial Weight Bearing Percentage or Pounds: 50% RLE Weight Bearing: Weight bearing as tolerated LLE Weight Bearing: Weight bearing as tolerated LLE Partial Weight Bearing Percentage or Pounds: 50%  Pain:   Reports L LE pain, unrated, using personal L LE weighted blanket massager for pain management.    Therapy/Group: Individual Therapy  Ginny Forth , PT, DPT, CSRS  01/09/2021, 12:43 PM
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
## 2021-01-09 NOTE — Consult Note (Signed)
WOC Nurse ostomy follow up Patient receiving care in [facility] [phone]. Stoma type/location: LUQ colostomy Stomal assessment/size: pink, moist, productive Peristomal assessment: MCS pocket now 1.8 cm deep. Old retention suture wound much improved, very small now. Treatment options for stomal/peristomal skin: narrow piece of Aquacel into MCS pocket, and over wound. Then barrier ring over wound and around stoma; new pouching system applied. Output: thin yellow Ostomy pouching: 2pc. 2 and 3/4 inch skin barrier [place] #2) and pouch [place] [phone]) Education provided: patient continues to observe pouch change process.  Pouch placed on 1/7 remained intact without leakage. Enrolled patient in [place] Secure Start Discharge program: Yes and box arrived at home 2 days after enrollment form sent. Supplies in room.  I then viewed the abdominal wound.  Re-epithelialization continues.  Some additional clear suture material was able to be gently removed. The Xeroform dressing was changed.  The wound bed is 100% pink. I spoke with the primary nurse, Blare, about the pouch change and the xeroform change.  Helmut Muster, RN, MSN, CWOCN, CNS-BC, pager [phone]

## 2021-01-09 NOTE — Progress Notes (Signed)
Patient ID: Sara [name], female   DOB: [birth year], 55 y.o.   MRN: [id number]   Daughter unavailable for family education tomorrow  (Wednesday,1/12) due to working. Daughter following up with other family members.  [place], [place] [phone]

## 2021-01-09 NOTE — Patient Care Conference (Signed)
Inpatient RehabilitationTeam Conference and Plan of Care Update Date: 01/09/2021   Time: 11:41 AM    Patient Name: Sara [name]      Medical Record Number: [id number]  Date of Birth: [birth year] Sex: Female         Room/Bed: 4W23C/4W23C-01 Payor Info: Payor: [place] MEDICAID PREPAID HEALTH PLAN / Plan: [place] MEDICAID [facility] / Product Type: *No Product type* /    Admit Date/Time:  12/14/2020  3:55 PM  Primary Diagnosis:  Intensive care (ICU) myopathy  Hospital Problems: Principal Problem:   Intensive care (ICU) myopathy Active Problems:   Debility   Malnutrition of moderate degree   Urinary retention   Hypokalemia   Hypomagnesemia   Multiple trauma   Postoperative pain   Acute lower UTI    Expected Discharge Date: Expected Discharge Date: 01/17/21  Team Members Present: Physician leading conference: Dr. Genice Rouge Care Coodinator Present: Kennyth Arnold, RN, BSN, CRRN;Christina Culver, BSW Nurse Present: Margot Ables, LPN PT Present: [name], PT OT Present: Roney Mans, OT PPS Coordinator present : Fae Pippin, SLP     Current Status/Progress Goal Weekly Team Focus  Bowel/Bladder   in/out cath for no void q6hr; ostomy LLQ  time toilet while awake; increase Erb care with ostomy  assist with toileting need prn   Swallow/Nutrition/ Hydration             ADL's   Min/Mod bathing Supervision for UB at sink and assist for buttocks at bed level but can reach periarea with HOB elevated, slide board > BSC with Max A/2 helpers for safety, LB dress total at bed level but increased participation recently  Min overall including LB ADLs and ADL transfers  lateral leans, BSC transfers!!, OOB tolerance, LB ADLs, family ed   Mobility   mod A bed mobility, mod A slide board bed to chair, +2 car transfer via slide board, w/c mobility S  min to mod A transfers, w/c mobility S  LE strength/ROM, attempts at sit to stand, slide board transfers, w/c mobility; needs more family  ed.   Communication             Safety/Cognition/ Behavioral Observations            Pain   c/o pain to bil LE; prn oxy  pain level >4/10  assess pain level QS and prn   Skin   x surgical incisions OTA; midline wound Q drsg; small wound beside ostomy stoma  Pt wounds will continue to heal and remain free of new skin break down/infection  assess skin QS an prn     Discharge Planning:  Discharging to mothers home home with family assistance from dauhgter, mother and sister (daughter arranging 27/7 schedule with family) NO DME, 2 Level home, will remain on 1st 6steps with railings   Team Discussion: Patient reports LLE pain, given pain medication and muscle relaxer by nursing this morning. Patient still refusing some medications, and treatments. Patients declines [place]-management of ostomy, refusing PRAFO's. Patient can void when up to the [facility]. Daughter is requesting more education time. Patient on target to meet rehab goals: ADL's completed at bed level, mod assist for bed mobility.  Patient is +2 for car transfers due to higher elevation of vehicle. Sedan would be better for patient transfer. Recommend for patient to use BSC versus putting in a foley at discharge.  *See Care Plan and progress notes for long and short-term goals.   Revisions to Treatment Plan:  Family meeting planned for  Thursday.  Teaching Needs: Family education, Transfer training, ostomy training, wound care/dressing changes, medication management.  Current Barriers to Discharge: Inaccessible home environment, Decreased caregiver support, Home enviroment access/layout, Incontinence, Wound care, Lack of/limited family support, Weight bearing restrictions, Medication compliance and Behavior  Possible Resolutions to Barriers: Family education, provide emotional support to patient and family     Medical Summary Current Status: c/o LLE knee pain, rib tightness, and shoulder pain; ordered lidoderm/Robaxin; needs to be  i/o cathed-can void on BSC- colostomy  Barriers to Discharge: Behavior;Medication compliance;Medical stability;Weight bearing restrictions;Wound care;Other (comments);Decreased family/caregiver support;Home enviroment access/layout;Weight;Neurogenic Bowel & Bladder  Barriers to Discharge Comments: family conference Thursday at 10am; colostomy; family wants more family education this week. suggest before family conference. is voiding some on BSC; Possible Resolutions to [employer] Focus: max A to [facility] with sliding board; not making great gains in spite of WB status now WBAT/no ROM restrictions; ADLs bed level; now on regular bed; mod A per PT- car transfer +2 Sliding board; won't/can't bend knees; sit-stand- max A or more.  d/c 1/19   Continued Need for Acute Rehabilitation Level of Care: The patient requires daily medical management by a physician with specialized training in physical medicine and rehabilitation for the following reasons: Direction of a multidisciplinary physical rehabilitation program to maximize functional independence : Yes Medical management of patient stability for increased activity during participation in an intensive rehabilitation regime.: Yes Analysis of laboratory values and/or radiology reports with any subsequent need for medication adjustment and/or medical intervention. : Yes   I attest that I was present, lead the team conference, and concur with the assessment and plan of the team.   Tennis Must 01/09/2021, 2:46 PM

## 2021-01-09 NOTE — Progress Notes (Signed)
Patient ID: Sara [name], female   DOB: June 01, 1966, 55 y.o.   MRN: [id number] Team Conference Report to Patient/Family  Team Conference discussion was reviewed with the patient and caregiver, including goals, any changes in plan of care and target discharge date.  Patient and caregiver express understanding and are in agreement.  The patient has a target discharge date of 01/17/21.  Andria Rhein 01/09/2021, 12:41 PM
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
## 2021-01-10 ENCOUNTER — Inpatient Hospital Stay (HOSPITAL_COMMUNITY): Payer: Medicaid Other

## 2021-01-10 ENCOUNTER — Inpatient Hospital Stay (HOSPITAL_COMMUNITY): Payer: Medicaid Other | Admitting: Occupational Therapy

## 2021-01-10 DIAGNOSIS — G7281 Critical illness myopathy: Secondary | ICD-10-CM | POA: Diagnosis not present

## 2021-01-10 NOTE — Progress Notes (Signed)
Recreational Therapy Session Note  Patient Details  Name: Sara [name] MRN: [id number] Date of Birth: 03/04/1966 Today's Date: 01/10/2021  Pain: no c/o Skilled Therapeutic Interventions/Progress Updates: Session focused on relaxation and motivation strategies as well as discharge planning.  Pt in bed,covered from head to toe but easily aroused and engaged in conversation with LRT.  Pt excited about upcoming discharge and is anxious to see her dogs.  Discussed safety concerns and rewards of owning pets.  Pt discussing family conference scheduled for tomorrow and had a few questions about this.  Questions answered as able and later referred to PT coming in for next session.  Therapy/Group: Individual Therapy  Emilyrose Darrah 01/10/2021, 2:56 PM

## 2021-01-10 NOTE — Progress Notes (Signed)
This chaplain is present for F/U spiritual care. The chaplain appreciates the space given by PT for the chaplain to share a quick hello and I am thinking of you with the Pt.  This chaplain will F/U on Thursday.

## 2021-01-10 NOTE — Progress Notes (Signed)
Occupational Therapy Session Note  Patient Details  Name: Sara [name] MRN: [id number] Date of Birth: [birth year]  Today's Date: 01/10/2021 OT Individual Time: 4403-4742 OT Individual Time Calculation (min): 45 min    Short Term Goals: Week 4:  OT Short Term Goal 1 (Week 4): Pt will perform LB dress at bed level with Mod A OT Short Term Goal 2 (Week 4): Pt will perform BSC transfer w/ LRAD with Max A of 1 OT Short Term Goal 3 (Week 4): Pt will perform UB/LB bathing with AE PRN Min A to decrease caregiver burden  Skilled Therapeutic Interventions/Progress Updates:    Treatment session with focus on [name]-care retraining and transfers.  Pt received upright in w/c agreeable to therapy session.  Pt unlocked w/c brakes and propelled w/c to sink to engage in bathing and grooming tasks.  Pt completed oral care and bathing after setup for initial items, pt then able to reach remainder of items from sink.  Pt able to engage in LB bathing washing tops of legs, declining washing additionally.  Pt declined clothing this session, but donned clean hospital gown after bathing.  Completed slide board transfer to R back to bed with mod assist +2.  Therapist positioned step under feet for support and placed slide board total assist. Pt required increased assistance this session due to poor positioning of bed pad secondary to not wearing pants.  Pt required assistance to lift BLE to advance in to bed.  Pt left semi-reclined in bed with all needs in reach and pillows under BLE for improved positioning.    Therapy Documentation Precautions:  Precautions Precautions: Fall Precaution Comments: WBAT all 4 extremeties as of 1/6, Unrestricted ROM B knees, L ankle, B hips; PROM/AROM R wrist; colostomy; abdominal wound Required Braces or Orthoses: Splint/Cast Splint/Cast: no brace Restrictions Weight Bearing Restrictions: Yes RUE Weight Bearing: Weight bearing as tolerated LUE Weight Bearing: Weight bearing as  tolerated LUE Partial Weight Bearing Percentage or Pounds: 50 RLE Weight Bearing: Weight bearing as tolerated LLE Weight Bearing: Weight bearing as tolerated LLE Partial Weight Bearing Percentage or Pounds: 50% Pain:  Pt with c/o pain in LLE, unrated.  Pt with lidocaine patches on LLE.   Therapy/Group: Individual Therapy  Rosalio Loud 01/10/2021, 12:10 PM

## 2021-01-10 NOTE — Progress Notes (Signed)
During report, day shift nurse informed RN of pt last cath at 1700. RN will bladder scan at 2300. Pt stable at this time.

## 2021-01-10 NOTE — Progress Notes (Signed)
Physical Therapy Session Note  Patient Details  Name: Sara [name] MRN: [id number] Date of Birth: [birth year]  Today's Date: 01/10/2021 PT Individual Time: 0800-0900 PT Individual Time Calculation (min): 60 min   Short Term Goals: Week 4:  PT Short Term Goal 1 (Week 4): Patient to perform squat pivot transfer with mod A. PT Short Term Goal 2 (Week 4): Patient to perform supine to sit with min A using rails and leg lifter PT Short Term Goal 3 (Week 4): Patient to initiate car transfer training PT Short Term Goal 4 (Week 4): Caregivers to demonstrate safe bed<>chair transfer with S.  Skilled Therapeutic Interventions/Progress Updates:    Patient received supine in bed, asleep, but agreeable to PT. She reports 7/10 pain in L knee- PT providing rest breaks, repositioning and distractions to assist with pain management. RN alerted to patients pain level as well. PROM to B LE performed supine in bed. L knee limited to ~10* flexion. Unable to achieve neutral dorsiflexion in L ankle. Discussed use of CPM and PRAFOs with patient to emphasize importance of at least functional ROM for greater ease of transfers and gait progressions. Patient consistently refusing these measures. She was able to come sit edge of bed with ModA. Passive stretch into flexion of L knee x2 mins to patients tolerance. MaxA slideboard to wc. She was able to propel herself in wc to dayroom ModI. Additional passive stretch to L knee x5 mins with scar management and STM to L quad to further promote ROM into flexion. Patient attempting to complete sit<>stand x3 with RW and MaxA x2. Due to patients inability to flex B knees sufficiently, she was unable to come to standing from wc height surface, despite the MaxA x2 provided. Patient returning to room in wc. She refused seatbelt or chair pad alarm. RN aware. Call light within reach.    Therapy Documentation Precautions:  Precautions Precautions: Fall Precaution Comments: WBAT all 4  extremeties as of 1/6, Unrestricted ROM B knees, L ankle, B hips; PROM/AROM R wrist; colostomy; abdominal wound Required Braces or Orthoses: Splint/Cast Splint/Cast: no brace Restrictions Weight Bearing Restrictions: Yes RUE Weight Bearing: Weight bearing as tolerated LUE Weight Bearing: Weight bearing as tolerated LUE Partial Weight Bearing Percentage or Pounds: 50 RLE Weight Bearing: Weight bearing as tolerated LLE Weight Bearing: Weight bearing as tolerated LLE Partial Weight Bearing Percentage or Pounds: 50%    Therapy/Group: Individual Therapy  Elizebeth Koller, PT, DPT, CBIS  01/10/2021, 7:34 AM

## 2021-01-10 NOTE — Progress Notes (Signed)
Physical Therapy Session Note  Patient Details  Name: Sara [name] MRN: [id number] Date of Birth: March 29, 1966  Today's Date: 01/10/2021 PT Individual Time:Session1: 2119-4174; [place]: 0814-4818 PT Individual Time Calculation (min): 25 min & 65 min  Short Term Goals: Week 4:  PT Short Term Goal 1 (Week 4): Patient to perform squat pivot transfer with mod A. PT Short Term Goal 2 (Week 4): Patient to perform supine to sit with min A using rails and leg lifter PT Short Term Goal 3 (Week 4): Patient to initiate car transfer training PT Short Term Goal 4 (Week 4): Caregivers to demonstrate safe bed<>chair transfer with S.  Skilled Therapeutic Interventions/Progress Updates:    Session1:  Patient seen for bed level therex with plans for OOB in pm session.  Performed therex to include ankle pumps, used gait belt to stretch L ankle into DF 3 x 20 sec with cues, heel slides with strap for assist, SLR AROM R and AAROM with strap on L, SAQ x 2 sets with pillows under knees, then bridging pushing into pillows under knees x 10 each.  Performed scar massage to both leg scars except L across knee as still healing.  Patient rolled in bed to reposition bed pad and pulled herself up in bed using trendelenberg position and pulling on headboard.  Left with needs in reach and bed alarm active.  Session2:Patient in supine reports has to empty ostomy.  Patient rolled in bed with min A to don pants with mod to max A.  Patient supine to sit moving L LE with strap toward EOB till increased in pain then mod A to complete for trunk support as repositioning hands and for scooting hips.  Performed slide board transfer to L with mod to max A for board placement and scooting hips.  Patient in w/c assisted to dayroom.  Performed squat pivot on R LE to Nu Step with +2 mod A.  Patient on Nu Step x 5 min with rest break after 3 minutes for UE/LE's at level 1 cues and assist for L knee flexion (measured 34 degrees after exercise).   Patient squat pivot to w/c with +2 A.  Propelled w/c around cones in gym with S and cues x 60'.  Patient propelled to room with S.  Slide board transfer to bed with max A for board placement and scooting hips.  Sit to supine mod A for LE's and pt able to reposition up in bed using headboard.  Left with call bell and needs in reach, bed alarm active.   Therapy Documentation Precautions:  Precautions Precautions: Fall Precaution Comments: WBAT all 4 extremeties as of 1/6, Unrestricted ROM B knees, L ankle, B hips; PROM/AROM R wrist; colostomy; abdominal wound Required Braces or Orthoses: Splint/Cast Splint/Cast: no brace Restrictions Weight Bearing Restrictions: Yes RUE Weight Bearing: Weight bearing as tolerated LUE Weight Bearing: Weight bearing as tolerated LUE Partial Weight Bearing Percentage or Pounds: 50 RLE Weight Bearing: Weight bearing as tolerated LLE Weight Bearing: Weight bearing as tolerated LLE Partial Weight Bearing Percentage or Pounds: 50% Pain: Pain Assessment Faces Pain Scale: Hurts little more Pain Type: Acute pain Pain Location: Leg Pain Orientation: Left Pain Descriptors / Indicators: Aching Pain Onset: On-going Pain Intervention(s): Repositioned    Therapy/Group: Individual Therapy  [name]  [name], PT 01/10/2021, 12:45 PM
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
## 2021-01-11 ENCOUNTER — Inpatient Hospital Stay (HOSPITAL_COMMUNITY): Payer: Medicaid Other

## 2021-01-11 ENCOUNTER — Ambulatory Visit (HOSPITAL_COMMUNITY): Payer: Medicaid Other

## 2021-01-11 ENCOUNTER — Inpatient Hospital Stay (HOSPITAL_COMMUNITY): Payer: Medicaid Other | Admitting: Occupational Therapy

## 2021-01-11 DIAGNOSIS — R5381 Other malaise: Secondary | ICD-10-CM | POA: Diagnosis not present

## 2021-01-11 DIAGNOSIS — R531 Weakness: Secondary | ICD-10-CM | POA: Diagnosis not present

## 2021-01-11 DIAGNOSIS — Z515 Encounter for palliative care: Secondary | ICD-10-CM | POA: Diagnosis not present

## 2021-01-11 DIAGNOSIS — G7281 Critical illness myopathy: Secondary | ICD-10-CM | POA: Diagnosis not present

## 2021-01-11 DIAGNOSIS — Z7189 Other specified counseling: Secondary | ICD-10-CM | POA: Diagnosis not present

## 2021-01-11 LAB — BASIC METABOLIC PANEL
Anion gap: 12 (ref 5–15)
BUN: 10 mg/dL (ref 6–20)
CO2: 20 mmol/L — ABNORMAL LOW (ref 22–32)
Calcium: 10.1 mg/dL (ref 8.9–10.3)
Chloride: 108 mmol/L (ref 98–111)
Creatinine, Ser: 0.68 mg/dL (ref 0.44–1.00)
GFR, Estimated: >60 mL/min (ref >60)
Glucose, Bld: 84 mg/dL (ref 70–99)
Potassium: 4 mmol/L (ref 3.5–5.1)
Sodium: 140 mmol/L (ref 135–145)

## 2021-01-11 MED ORDER — TRAMADOL HCL 50 MG PO TABS
100.0000 mg | ORAL_TABLET | Freq: Three times a day (TID) | ORAL | Status: DC
  Administered 2021-01-11 – 2021-01-17 (×18): 100 mg via ORAL
  Filled 2021-01-11 (×18): qty 2

## 2021-01-11 MED ORDER — LIDOCAINE 5 % EX PTCH
3.0000 | MEDICATED_PATCH | Freq: Every day | CUTANEOUS | Status: DC
  Administered 2021-01-11 – 2021-01-15 (×5): 3 via TRANSDERMAL
  Administered 2021-01-16: 2 via TRANSDERMAL
  Administered 2021-01-17: 3 via TRANSDERMAL
  Filled 2021-01-11 (×7): qty 3
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
## 2021-01-11 NOTE — Progress Notes (Signed)
RN attempted to Cath pt at 2235. Pt refused. Pt stated, "I am sleeping and we can do it later." RN will attempt to cath pt in early morning. RN notified charge nurse. Pt in no acute distress.

## 2021-01-11 NOTE — Progress Notes (Signed)
Patient ID: Sara [name], female   DOB: 1966/07/23, 55 y.o.   MRN: [id number]  Patient transitioned to inpatient CIR rehabilitation unit 27 days ago.  Medical records reviewed today,  NP visited patient at the bedside along with rehab treatment team to meet with family for planning meeting.  [name] is OOB to wheelchair, alert and oriented. Her sister/[name] and daughter/[name] are present.  Dr Birdie Riddle updated medical situation. SW and therapies all made speciality contributions to meeting.   Created space and opportunity for patient and family to explore thoughts and feelings regarding current medical situation.     Plan is for [name] to go  home with her sister and multiple family members will be participating in her care.  This is a big under taking, patient has high level care needs  I shared with [name] my concern with her motivation and progress.  Ongoing education regarding the importance of  personal responsibility in treatment plan.  Again introduced mind, body, spirit and positive talk/ affirmations.   Emotional support offered.  Education offered on the importance of conversation and documentation of healthcare power of attorney and advanced care planning documents.  MOST form introduced and copy left for review.  AD Blue book left at bedside, [name] verbalizes she woud want her daughter [name] to be her HPOA.  Questions and concerns addressed      PMT will continue to support holistically      Recommend outpatient community-based palliative on discharge.  Total time spent on the unit was 35  minutes  Greater than 50% of the time was spent in counseling and coordination of care  [name] NP  Palliative Medicine Team Team Phone # [phone] Pager [phone]

## 2021-01-11 NOTE — Progress Notes (Signed)
Physical Therapy Session Note  Patient Details  Name: Sara [name] MRN: [id number] Date of Birth: December 24, 1966  Today's Date: 01/11/2021 PT Individual Time: 0800-0900 PT Individual Time Calculation (min): 60 min   Short Term Goals: Week 1:  PT Short Term Goal 1 (Week 1): Pt will perform supine<>sit with max assist of 1 PT Short Term Goal 1 - Progress (Week 1): Met PT Short Term Goal 2 (Week 1): Pt will perform bed<>chair transfers using LRAD with max assist of 1 and +2 min assist PT Short Term Goal 2 - Progress (Week 1): Met PT Short Term Goal 3 (Week 1): Pt will perform sit<>stands using equipment as needed with +2 max assist PT Short Term Goal 3 - Progress (Week 1): Met PT Short Term Goal 4 (Week 1): Pt will tolerate sitting upright, OOB in TIS w/c for at least 3 total hours a day PT Short Term Goal 4 - Progress (Week 1): Progressing toward goal Week 2:  PT Short Term Goal 1 (Week 2): Patient will perform bed <>chair transfers with mod A of 1 PT Short Term Goal 1 - Progress (Week 2): Met PT Short Term Goal 2 (Week 2): Patient will initiate gait with LRAD as tolerated and +2 A for safety PT Short Term Goal 2 - Progress (Week 2): Progressing toward goal PT Short Term Goal 3 (Week 2): Patient to transition to w/c able to propel with min A x 100' PT Short Term Goal 3 - Progress (Week 2): Met PT Short Term Goal 4 (Week 2): Paient sit to stand max A of 1 to appropriate AD. PT Short Term Goal 4 - Progress (Week 2): Progressing toward goal Week 3:  PT Short Term Goal 1 (Week 3): Patient to perform supine to sit with min A. PT Short Term Goal 1 - Progress (Week 3): Not met PT Short Term Goal 2 (Week 3): Patient to perform sit to stand max A of 1 to appropriate AD. PT Short Term Goal 2 - Progress (Week 3): Not met PT Short Term Goal 3 (Week 3): Patient to direct w/c set up for transfer then for propulsion with S. PT Short Term Goal 3 - Progress (Week 3): Progressing toward goal PT Short Term  Goal 4 (Week 3): Patient to initiate gait training. PT Short Term Goal 4 - Progress (Week 3): Not met PT Short Term Goal 5 (Week 3): Patient to perform car transfers with mod A of 2. PT Short Term Goal 5 - Progress (Week 3): Not met  Skilled Therapeutic Interventions/Progress Updates:    PAIN  7-8 leg/ribs.    Pt initially supine and agreeable to session.  Supine to sit on edge of bed w/significantly increased time and min assist. In sitting, pt scoots w/encourangement and supervision.  Pt encouraged to dangle LEs for stretching. Bed to wc w/downhill bias, discussed using bed features at home for ease  Of work w/transfers, min assist and use of step stool for LE support.  wc propulsion x 160f mod I on level surfaces.  Reviewed and practiced technique for tight turns w/wc.  Pt requeres repeated cerbal cues for technique.  Ramp - pt instructed w/mechanics for wc propulsion then ascended/descended ramp w/min assist and verbal cues for technique.  Able to perform 2x, then an additional time following seated rest.  Performed for functional moblity and UE strengthening.  wc propulsion thru cones placed at progressively shorter intervals to practice functional mobility/home environment.  Repeated course x 4 x w/good controlof  wc demonstrated w/task.     RLE LAQs w/2lb wt x 20then pt dangles w/wt for stretching, repeats x 20 LLE LAQS AROM x 20, dangles in flexion for stretching, repeats x 20  At end of session, pt propels 134f as above. Pt left oob in wc w/alarm belt set and needs in reach     Therapy Documentation Precautions:  Precautions Precautions: Fall Precaution Comments: WBAT all 4 extremeties as of 1/6, Unrestricted ROM B knees, L ankle, B hips; PROM/AROM R wrist; colostomy; abdominal wound Required Braces or Orthoses: Splint/Cast Splint/Cast: no brace Restrictions Weight Bearing Restrictions: Yes RUE Weight Bearing: Weight bearing as tolerated LUE Weight Bearing: Weight  bearing as tolerated LUE Partial Weight Bearing Percentage or Pounds: 50 RLE Weight Bearing: Weight bearing as tolerated LLE Weight Bearing: Weight bearing as tolerated LLE Partial Weight Bearing Percentage or Pounds: 50%   Therapy/Group: Individual Therapy  BCallie Fielding PSchuyler1/13/2022, 1:00 PM

## 2021-01-11 NOTE — Progress Notes (Signed)
Occupational Therapy Session Note  Patient Details  Name: Sara [name] MRN: [id number] Date of Birth: 1966-07-20  Today's Date: 01/11/2021 OT Individual Time: 1015-1030 OT Individual Time Calculation (min): 15 min     Skilled Therapeutic Interventions/Progress Updates:    Family conference with patient, sister and daughter alongside MD, palliative care NP, SW and PT. Discussed pt  Progress; realistic goals and outcomes at home; reviewed level of assistance with functional mobility and ADL (including bathing and dressing and toileting). Also discussed recommendations for equipment for home for ease of pt and their caregiver. Pt and family with more clear understanding of patients current functional status and plan to establish how to best meet her needs. Family does plan to have a ramp built. Home measurement will be provided to family today by way of the SW. As of right now pt will not receive HH services and will have to wait until appropriate to attend outpatient for therapies. Family education with hands on training continued to be scheduled.   Therapy Documentation Precautions:  Precautions Precautions: Fall Precaution Comments: WBAT all 4 extremeties as of 1/6, Unrestricted ROM B knees, L ankle, B hips; PROM/AROM R wrist; colostomy; abdominal wound Required Braces or Orthoses: Splint/Cast Splint/Cast: no brace Restrictions Weight Bearing Restrictions: Yes RUE Weight Bearing: Weight bearing as tolerated LUE Weight Bearing: Weight bearing as tolerated LUE Partial Weight Bearing Percentage or Pounds: 50 RLE Weight Bearing: Weight bearing as tolerated LLE Weight Bearing: Weight bearing as tolerated LLE Partial Weight Bearing Percentage or Pounds: 50% General:   Vital Signs:   Pain: No pain indicated in session   Therapy/Group: Co-Treatment  Adan Sis 01/11/2021, 10:51 AM

## 2021-01-11 NOTE — Progress Notes (Signed)
Physical Therapy Session Note  Patient Details  Name: Sara [name] MRN: [id number] Date of Birth: Dec 01, 1966  Today's Date: 01/11/2021 PT Individual Time:Session1: 8366-2947; [place]: [id number] PT Individual Time Calculation (min): 45 min & 75 min  Short Term Goals: Week 4:  PT Short Term Goal 1 (Week 4): Patient to perform squat pivot transfer with mod A. PT Short Term Goal 2 (Week 4): Patient to perform supine to sit with min A using rails and leg lifter PT Short Term Goal 3 (Week 4): Patient to initiate car transfer training PT Short Term Goal 4 (Week 4): Caregivers to demonstrate safe bed<>chair transfer with S.  Skilled Therapeutic Interventions/Progress Updates:    Session1: Patient up in w/c reports had early session working on pushing the w/c up a ramp and working on UE strength.  Propelled in w/c x 160' then pushed to ortho gym for arm bike x 5 min (2.5 forward and 2.5 back) at level 1.5.  Pushed in w/c to room for family meeting with MD, SW, palliative care, OT, PT, daughter and sister.  Discussion regarding progress, patient's participation, level for d/c, potential equipment needs, family participation for training and needing home measurements.  Patient left in chair with family and staff in the room.   Session2:Patient in w/c with daughter and sister in the room.  Seen with TR and performed caregiver education for transfers, w/c set up, board placement, discussed options for car transfers and sister and daughter both practiced assisting with slide board transfers w/c<>bed with close S mod cues and min A x 1 for sister on first attempt.  Educated in Production manager.  Demonstrated slide board transfers from w/c to bed first using bed pad with mod to max A as pt wearing hospital gown and brief.  Once supine pt sister assisted with donning pants and pt able to roll with min A using rail for pulling up pants.  Educated in pt's method for supine to sit and pt performed with min  to mod A.  Demonstrated back to w/c with mod A using board and easier transfer with pants on.  Patient in w/c assisted to therapy gym to demonstrate using hoyer lift for transfers as option if they feel they cannot handle transfers safely.  Patient transferred on and off mat first with daughter assist and S then with sister A with min A.  They assisted with w/c set up as well.  Patient assisted in w/c to room.  Set up for transfer and pt performed with sister A and S.  Sister also assist for pt to supine and doffing pants.  Encouraged further sessions with their participation and son's as well.  Discussed schedule for next day and they report plans to come Monday and Tuesday next week and daughter may come tomorrow as well.  Left patient with family in the room.   Therapy Documentation Precautions:  Precautions Precautions: Fall Precaution Comments: WBAT all 4 extremeties as of 1/6, Unrestricted ROM B knees, L ankle, B hips; PROM/AROM R wrist; colostomy; abdominal wound Required Braces or Orthoses: Splint/Cast Splint/Cast: no brace Restrictions Weight Bearing Restrictions: Yes RUE Weight Bearing: Weight bearing as tolerated LUE Weight Bearing: Weight bearing as tolerated LUE Partial Weight Bearing Percentage or Pounds: 50 RLE Weight Bearing: Weight bearing as tolerated LLE Weight Bearing: Weight bearing as tolerated LLE Partial Weight Bearing Percentage or Pounds: 50% Pain: Pain Assessment Pain Scale: 0-10 Pain Score: 7  Pain Type: Acute pain Pain Location: Leg Pain Orientation:  Left Pain Descriptors / Indicators: Aching Pain Frequency: Intermittent Pain Onset: On-going Patients Stated Pain Goal: 3 Pain Intervention(s): Repositioned   Therapy/Group: Individual Therapy  [name]  [name], PT 01/11/2021, 9:50 AM

## 2021-01-11 NOTE — Progress Notes (Signed)
Occupational Therapy Note  Patient Details  Name: Sara [name] MRN: [id number] Date of Birth: [birth year]  The following the equipment is recommended by occupational therapy to increase pt's ability to perform ADL and decr burden of care: WIDE BSC WITH DROP ARM. Pt needs this to be able to have enough room to assist with lateral leans for toileting and to ease of caregiver for slide board transfers safely.  Also recommended for a hospital bed for pt to help guide her care with bed rails and adjustment base and to assist caregiver when assisting with ADLs.    Roney Mans [facility] 01/11/2021, 10:46 AM

## 2021-01-11 NOTE — Progress Notes (Signed)
Patient ID: Sara [name], female   DOB: 1966-06-18, 55 y.o.   MRN: [id number]   Wheelchair, drop arm and HB ordered through [employer].  [place], [place] [phone]
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
## 2021-01-12 ENCOUNTER — Inpatient Hospital Stay (HOSPITAL_COMMUNITY): Payer: Medicaid Other | Admitting: Physical Therapy

## 2021-01-12 ENCOUNTER — Inpatient Hospital Stay (HOSPITAL_COMMUNITY): Payer: Medicaid Other | Admitting: Occupational Therapy

## 2021-01-12 ENCOUNTER — Inpatient Hospital Stay (HOSPITAL_COMMUNITY): Payer: Medicaid Other

## 2021-01-12 DIAGNOSIS — G7281 Critical illness myopathy: Secondary | ICD-10-CM | POA: Diagnosis not present

## 2021-01-12 NOTE — Progress Notes (Signed)
Chaplain attempted spiritual care F/U for notarizing Pt. AD.  The Pt. is with healthcare team at time of visit.  This chaplain will visit again today.

## 2021-01-12 NOTE — Progress Notes (Signed)
Pt refused to be cath after 6 hr of no void. Pt requested to be cath in the early morning. RN educated pt and  notified Press photographer. Pt in no acute distress. RN will continue to monitor.

## 2021-01-12 NOTE — Progress Notes (Signed)
Occupational Therapy Weekly Progress Note  Patient Details  Name: Sara [name] MRN: [id number] Date of Birth: [birth year]  Beginning of progress report period: January 04, 2021 End of progress report period: January 12, 2021  Today's Date: 01/12/2021 OT Individual Time: 3557-3220 OT Individual Time Calculation (min): 53 min    Patient has met 2 of 3 short term goals.  Pt is making slow, but improved progress towards goals.  Pt currently is able to complete bathing at min assist with LB at bed level and use of AE for LB bathing and then completing UB bathing seated EOB or at sink.  Pt is able to don shirt or gown with supervision but continues to require max-total assist for LB dressing due to difficulty bending and lifting legs, especially LLE.  Pt will benefit from continued education on use of AE for LB dressing.  Pt is able to complete slide board transfers with mod assist with improved weight shifting and tolerance of WB through BLE.  Pt has begun family education with sister and daughter, but will continue to benefit from additional hands on education prior to d/c home.  Family conference 1/13 with focus on current level, therapy recommendations, recommended equipment, and d/c planning.  Patient continues to demonstrate the following deficits: muscle weakness, decreased cardiorespiratoy endurance, impaired timing and sequencing and decreased motor planning and decreased sitting balance, decreased standing balance, decreased postural control and decreased balance strategies and therefore will continue to benefit from skilled OT intervention to enhance overall performance with BADL and Reduce care partner burden.  Patient progressing toward long term goals..  Continue plan of care.  OT Short Term Goals Week 4:  OT Short Term Goal 1 (Week 4): Pt will perform LB dress at bed level with Mod A OT Short Term Goal 1 - Progress (Week 4): Progressing toward goal OT Short Term Goal 2 (Week 4): Pt  will perform BSC transfer w/ LRAD with Max A of 1 OT Short Term Goal 2 - Progress (Week 4): Met OT Short Term Goal 3 (Week 4): Pt will perform UB/LB bathing with AE PRN Min A to decrease caregiver burden OT Short Term Goal 3 - Progress (Week 4): Met Week 5:  OT Short Term Goal 1 (Week 5): STG = LTGs due to remaining LOS  Skilled Therapeutic Interventions/Progress Updates:    Treatment session with focus on functional mobility and transfers.  Pt received semi-reclined in bed reporting "stiffness" in BLE, L > R.  Therapist discussed progress update and plan to problem solve toilet transfers this session. Discussed progression towards standing in parallel bars during PT and potential to stand for toilet transfer/clothing management with toileting.  Pt completed bed mobility with min assist to come to sitting at EOB.  Attempted sit > stand and squat pivot transfer with pt unable to tolerate enough WB through BLE to complete transfer at this time.  Therefore therapist suggested use of slide board.  Therapist placed board and then pt able to complete lateral scoot along slide board with min-mod assist (without block under feet).  Discussed clothing options to increase independence/decrease burden of care, with pt reports plan to wear gown.  Reiterated importance of fabric over buttocks and thighs to allow for improved ability to transfer along slide board with pt reporting understanding.  Pt transferred back to bed with min-mod assist along slide board.  Discussed use of AE for LB bathing and dressing with pt still reporting plan to wear gown and brief, therefore family  will most likely need to assist with incontinence brief at bed level or lateral leans.  Pt reports understanding.  Pt remained semi-reclined in bed with all needs in reach.  Therapy Documentation Precautions:  Precautions Precautions: Fall Precaution Comments: WBAT all 4 extremeties as of 1/6, Unrestricted ROM B knees, L ankle, B hips;  PROM/AROM R wrist; colostomy; abdominal wound Required Braces or Orthoses: Splint/Cast Splint/Cast: no brace Restrictions Weight Bearing Restrictions: Yes RUE Weight Bearing: Weight bearing as tolerated LUE Weight Bearing: Weight bearing as tolerated LUE Partial Weight Bearing Percentage or Pounds: 50 RLE Weight Bearing: Weight bearing as tolerated LLE Weight Bearing: Weight bearing as tolerated LLE Partial Weight Bearing Percentage or Pounds: 50 Pain: Pain Assessment Pain Scale: 0-10 Pain Score: 5  Pain Type: Acute pain Pain Location: Leg Pain Orientation: Left Pain Descriptors / Indicators: Aching Pain Frequency: Constant Pain Onset: On-going Patients Stated Pain Goal: 0 Pain Intervention(s): Medication (See eMAR)   Therapy/Group: Individual Therapy  Simonne Come 01/12/2021, 9:03 AM
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
## 2021-01-12 NOTE — Consult Note (Signed)
WOC Nurse ostomy follow up Patient receiving care in [facility] [phone]. Stoma type/location: LUQ colostomy Stomal assessment/size: approximately 1 inch Peristomal assessment: MCS pocket now 1.2 cm deep. Packed with a strip of Aquacel.  A strip of Aquacel and barrier ring placed over the old retention suture area, and a barrier ring placed around the stoma. Treatment options for stomal/peristomal skin: barrier rings. Output: thin yellow.  I emptied 225 ml of thin yellow stool from existing pouch.  Ostomy pouching: 2pc. 2 and 3/4 inches. Education provided: care of ostomy and wounds. Enrolled patient in [place] Secure Start Discharge program: Yes, previously. Additional pouches, skin barriers, and Aquacel in room.  I requested additional barrier rings.  I removed the existing abdominal wound dressing, cleaned it with saline, and placed a new Xeroform and foam dressing over the area.  I was not able to remove any of the exposed sutures today. Re-epithelialization of the wound bed continues. Helmut Muster, RN, MSN, CWOCN, CNS-BC, pager [phone]

## 2021-01-12 NOTE — Progress Notes (Addendum)
The chaplain understands the Pt. is ready to notarize HCPOA when Pt. finishes therapy. The chaplain will return later today.  **[id number] This chaplain is present with the Pt., notary, and witnesses for signing of Pt. Advance Directive HCPOA.  The Pt. named Sara [name] as her healthcare agent.  The chaplain gave the Pt. the original AD and one copy. A copy was scanned into the Pt. EMR.  This chaplain is available for F/U spiritual care as needed.

## 2021-01-12 NOTE — Progress Notes (Signed)
Physical Therapy Session Note  Patient Details  Name: MAUD RUBENDALL MRN: [id number] Date of Birth: 02/15/1966  Today's Date: 01/12/2021 PT Individual Time: 1545-1630 PT Individual Time Calculation (min): 45 min   Short Term Goals: Week 5:  PT Short Term Goal 1 (Week 5): STG=LTG due to ELOS  Skilled Therapeutic Interventions/Progress Updates:     Pt received supine in bed and agreeable to PT at bed level. PT instructed pt in bed level therex. SLR, SAQ, hip abduction, hip adduction, quad sets, glute sets, Ankle DF/PF, hip/knee flexion/extension. All exercises performed 2 x 12 with AAROM to improve ROM and gentle overpressure form PT at end range. PT performed knee flexion/extension PROM on BLE with 6 sec hold at end range x 8 BLE. Ankle DF PROM stretch 2 x 1 min BLE. Pt reports increasing pain in the LLE/knee 8/10 following treatment. Pain med provided by RN. Pt left supine in bed with call bell in reach with all needs met.   Therapy Documentation Precautions:  Precautions Precautions: Fall Precaution Comments: WBAT all 4 extremeties as of 1/6, Unrestricted ROM B knees, L ankle, B hips; PROM/AROM R wrist; colostomy; abdominal wound Required Braces or Orthoses: Splint/Cast Splint/Cast: no brace Restrictions Weight Bearing Restrictions: Yes RUE Weight Bearing: Weight bearing as tolerated LUE Weight Bearing: Weight bearing as tolerated LUE Partial Weight Bearing Percentage or Pounds: 50 RLE Weight Bearing: Weight bearing as tolerated LLE Weight Bearing: Weight bearing as tolerated LLE Partial Weight Bearing Percentage or Pounds: 50 General: PT Amount of Missed Time (min): 15 Minutes PT Missed Treatment Reason: Patient fatigue Vital Signs: Therapy Vitals Temp: 99.5 F (37.5 C) Temp Source: Oral Pulse Rate: 80 Resp: 16 BP: (!) 92/59 Oxygen Therapy SpO2: 95 % O2 Device: Room Air Pain:   see above     Therapy/Group: Individual Therapy  Lorie Phenix 01/12/2021, 5:29 PM

## 2021-01-12 NOTE — Progress Notes (Signed)
Physical Therapy Weekly Progress Note  Patient Details  Name: Sara [name] MRN: [id number] Date of Birth: 1966/11/03  Beginning of progress report period: January 05, 2021 End of progress report period: January 12, 2021  Today's Date: 01/12/2021 PT Individual Time: 1030-1130 PT Individual Time Calculation (min): 60 min   Patient has met 3 of 4 short term goals.  Patient progressing with transfers this week at times with certain situations such as wearing proper clothing and getting at least level with sliding board she can perform slide board transfers with mod A (for board placement and w/c set up.)  She progressed today with sit to stand in parallel bars with mod to max A as well and daughter demonstrating slide board transfers today x 2 with S.  Patient and family attended meeting this week for presenting issues and working together towards helping pt to be able to successfully transition home in the next week.  Patient should continue to progress toward LTG's and transition home with family support with continued caregiver education and equipment prescription.    Patient continues to demonstrate the following deficits muscle weakness, muscle joint tightness and pain and therefore will continue to benefit from skilled PT intervention to increase functional independence with mobility.  Patient progressing toward long term goals..  Continue plan of care.  PT Short Term Goals Week 4:  PT Short Term Goal 1 (Week 4): Patient to perform squat pivot transfer with mod A. PT Short Term Goal 1 - Progress (Week 4): Partly met PT Short Term Goal 2 (Week 4): Patient to perform supine to sit with min A using rails and leg lifter PT Short Term Goal 2 - Progress (Week 4): Met PT Short Term Goal 3 (Week 4): Patient to initiate car transfer training PT Short Term Goal 3 - Progress (Week 4): Met PT Short Term Goal 4 (Week 4): Caregivers to demonstrate safe bed<>chair transfer with S. PT Short Term Goal 4  - Progress (Week 4): Met Week 5:  PT Short Term Goal 1 (Week 5): STG=LTG due to ELOS  Skilled Therapeutic Interventions/Progress Updates:  Ambulation/gait training;Balance/vestibular training;DME/adaptive equipment instruction;Functional electrical stimulation;Functional mobility training;Neuromuscular re-education;Pain management;Patient/family education;Psychosocial support;Skin care/wound management;Splinting/orthotics;Stair training;Therapeutic Activities;Therapeutic Exercise;UE/LE Strength taining/ROM;Wheelchair propulsion/positioning  Patient seen with daughter present for continued caregiver education.  Daughter educated on technique for donning pants in supine and performed with pt assisting some and rolling in bed.  She assisted pt up to sit from supine with min A pt using rails and HOB elevation.  Patient scooting to EOB with CGA.  Daughter set up w/c with S and cues and assisted pt with slide board transfer with mod cues.  Patient in w/c daughter able to apply legrests and pt propelled w/c x 150' then pushed to ortho gym.  Demonstrated slide board car transfer to 27" height to simulate daughter's van.  +2 A needed for success and stool under pt's feet.  Discussed why continue to recommend non-emergent medical transport to get home safely, but that they can continue attempts at getting her in car for transport to medical appointments, etc. Patient back in w/c with min A going downhill back to chair.  Daughter applied legrests.  Assisted in w/c to general gym.  In parallel bars performed sit to stand x 2 with mod to max A (+2 for safety) and standing each time for 1 minute.  Patient assisted back to room in w/c.  Daughter performed slide board transfer with S back to bed and sit to  supine as well.  Pt scooting up in bed using bed in trendelenburg and pulling on head board.  Left in supine with daughter present, needs and call bell in reach and bed alarm active.   Therapy Documentation Precautions:   Precautions Precautions: Fall Precaution Comments: WBAT all 4 extremeties as of 1/6, Unrestricted ROM B knees, L ankle, B hips; PROM/AROM R wrist; colostomy; abdominal wound Required Braces or Orthoses: Splint/Cast Splint/Cast: no brace Restrictions Weight Bearing Restrictions: Yes RUE Weight Bearing: Weight bearing as tolerated LUE Weight Bearing: Weight bearing as tolerated LUE Partial Weight Bearing Percentage or Pounds: 50 RLE Weight Bearing: Weight bearing as tolerated LLE Weight Bearing: Weight bearing as tolerated LLE Partial Weight Bearing Percentage or Pounds: 50 Pain: Pain Assessment Faces Pain Scale: Hurts little more Pain Type: Acute pain Pain Location: Leg Pain Orientation: Right;Left Pain Descriptors / Indicators: Aching Pain Onset: With Activity Pain Intervention(s): Rest;Repositioned     Therapy/Group: Individual Therapy  Reginia Naas  Magda Kiel, PT 01/12/2021, 12:22 PM
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
## 2021-01-13 ENCOUNTER — Inpatient Hospital Stay (HOSPITAL_COMMUNITY): Payer: Medicaid Other

## 2021-01-13 DIAGNOSIS — G7281 Critical illness myopathy: Secondary | ICD-10-CM | POA: Diagnosis not present

## 2021-01-13 MED ORDER — OXYCODONE HCL 5 MG PO TABS
5.0000 mg | ORAL_TABLET | Freq: Four times a day (QID) | ORAL | Status: DC | PRN
  Administered 2021-01-14: 10 mg via ORAL
  Filled 2021-01-13: qty 2

## 2021-01-13 NOTE — Progress Notes (Addendum)
Physical Therapy Session Note  Patient Details  Name: Sara [name] MRN: [id number] Date of Birth: 26-Nov-1966  Today's Date: 01/13/2021 PT Individual Time: 1898-4210 PT Individual Time Calculation (min): 45 min   Short Term Goals:  Week 4:  PT Short Term Goal 1 (Week 4): Patient to perform squat pivot transfer with mod A. PT Short Term Goal 1 - Progress (Week 4): Partly met PT Short Term Goal 2 (Week 4): Patient to perform supine to sit with min A using rails and leg lifter PT Short Term Goal 2 - Progress (Week 4): Met PT Short Term Goal 3 (Week 4): Patient to initiate car transfer training PT Short Term Goal 3 - Progress (Week 4): Met PT Short Term Goal 4 (Week 4): Caregivers to demonstrate safe bed<>chair transfer with S. PT Short Term Goal 4 - Progress (Week 4): Met Week 5:  PT Short Term Goal 1 (Week 5): STG=LTG due to ELOS  Skilled Therapeutic Interventions/Progress Updates:  Pt resting in bed.  She rated LLE pain 7/10, premedicated.  PT threaded pants on pt's LEs in supine, and she pulled them up with min assist and rolling L><R with use of rails.  Supine> sit with min assist.  Sitting EOB, R/L lateral leans x 5 each for transfer and bed mobility training.  Pt placed SB by leaning L, with min assist.  Cues to place board under her thighs rather than buttocks.  Slide board transfer with mod assist overall, for stabilizing wc and min assist to move across board, using raised bed and foot stool.  Sit> stand in parallel bars, x 1 min, x 1.5 min with max assist to stand and min assist once standing.   wc propulsion using bil UEs x 200' on level tile, supervision.  Slide board transfer as above to return to bed.  Mod assist for bil LEs for sit> supine.  At end of session, pt resting in bed with needs at hand and alarm set and HOB raised.     Therapy Documentation Precautions:  Precautions Precautions: Fall Precaution Comments: WBAT all 4 extremeties as of 1/6, Unrestricted ROM B  knees, L ankle, B hips; PROM/AROM R wrist; colostomy; abdominal wound Required Braces or Orthoses: Splint/Cast Splint/Cast: no brace Restrictions Weight Bearing Restrictions: Yes RUE Weight Bearing: Weight bearing as tolerated LUE Weight Bearing: Weight bearing as tolerated LUE Partial Weight Bearing Percentage or Pounds: 50 RLE Weight Bearing: Weight bearing as tolerated LLE Weight Bearing: Weight bearing as tolerated LLE Partial Weight Bearing Percentage or Pounds: 50        Therapy/Group: Individual Therapy  Lakira Ogando 01/13/2021, 4:36 PM
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
## 2021-01-13 NOTE — Progress Notes (Signed)
Pt refused to be cath at 2230pm. Bladder scan . Pt stated, " I dont want to be cath if it is under ." RN educated pt on risk of not relieving bladder. RN will continue to monitor. Pt in no acute distress.

## 2021-01-13 NOTE — Progress Notes (Signed)
Pt.s colostomy bag was cleaned due to it being full. This Clinical research associate noticed that pt.s stool was liquid and yellow. This Clinical research associate asked pt if it has normally been this color, pt only responded with "I have been drinking a lot of lemonade." RN [employer] notified.

## 2021-01-14 DIAGNOSIS — G7281 Critical illness myopathy: Secondary | ICD-10-CM | POA: Diagnosis not present

## 2021-01-14 MED ORDER — OXYCODONE HCL 5 MG PO TABS: 5.0000 mg | ORAL_TABLET | Freq: Three times a day (TID) | ORAL | Status: DC | PRN
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
## 2021-01-14 NOTE — Progress Notes (Signed)
Patient bladder scanned for 321. Refused in/out cath, educated patient. Will let oncoming shift know. Call bell within reach.

## 2021-01-15 ENCOUNTER — Inpatient Hospital Stay (HOSPITAL_COMMUNITY): Payer: Medicaid Other | Admitting: Physical Therapy

## 2021-01-15 ENCOUNTER — Encounter (HOSPITAL_COMMUNITY): Payer: Medicaid Other | Admitting: Occupational Therapy

## 2021-01-15 ENCOUNTER — Inpatient Hospital Stay (HOSPITAL_COMMUNITY): Payer: Medicaid Other | Admitting: Occupational Therapy

## 2021-01-15 DIAGNOSIS — G7281 Critical illness myopathy: Secondary | ICD-10-CM | POA: Diagnosis not present

## 2021-01-15 LAB — CBC WITH DIFFERENTIAL/PLATELET
Abs Immature Granulocytes: 0.04 10*3/uL (ref 0.00–0.07)
Basophils Absolute: 0 10*3/uL (ref 0.0–0.1)
Basophils Relative: 1 %
Eosinophils Absolute: 0.4 10*3/uL (ref 0.0–0.5)
Eosinophils Relative: 5 %
HCT: 36.3 % (ref 36.0–46.0)
Hemoglobin: 10.8 g/dL — ABNORMAL LOW (ref 12.0–15.0)
Immature Granulocytes: 1 %
Lymphocytes Relative: 24 %
Lymphs Abs: 1.8 10*3/uL (ref 0.7–4.0)
MCH: 29.1 pg (ref 26.0–34.0)
MCHC: 29.8 g/dL — ABNORMAL LOW (ref 30.0–36.0)
MCV: 97.8 fL (ref 80.0–100.0)
Monocytes Absolute: 0.6 10*3/uL (ref 0.1–1.0)
Monocytes Relative: 8 %
Neutro Abs: 4.5 10*3/uL (ref 1.7–7.7)
Neutrophils Relative %: 61 %
Platelets: 343 10*3/uL (ref 150–400)
RBC: 3.71 MIL/uL — ABNORMAL LOW (ref 3.87–5.11)
RDW: 13.4 % (ref 11.5–15.5)
WBC: 7.2 10*3/uL (ref 4.0–10.5)
nRBC: 0 % (ref 0.0–0.2)

## 2021-01-15 LAB — BASIC METABOLIC PANEL
Anion gap: 11 (ref 5–15)
BUN: 13 mg/dL (ref 6–20)
CO2: 22 mmol/L (ref 22–32)
Calcium: 10.2 mg/dL (ref 8.9–10.3)
Chloride: 105 mmol/L (ref 98–111)
Creatinine, Ser: 0.77 mg/dL (ref 0.44–1.00)
GFR, Estimated: >60 mL/min (ref >60)
Glucose, Bld: 84 mg/dL (ref 70–99)
Potassium: 4.4 mmol/L (ref 3.5–5.1)
Sodium: 138 mmol/L (ref 135–145)

## 2021-01-15 LAB — MAGNESIUM: Magnesium: 1.6 mg/dL — ABNORMAL LOW (ref 1.7–2.4)

## 2021-01-15 NOTE — Progress Notes (Signed)
Physical Therapy Session Note  Patient Details  Name: Sara [name] MRN: [id number] Date of Birth: 23-Aug-1966  Today's Date: 01/15/2021 PT Individual Time: 8280-0349 PT Individual Time Calculation (min): 45 min   Short Term Goals: Week 5:  PT Short Term Goal 1 (Week 5): STG=LTG due to ELOS  Skilled Therapeutic Interventions/Progress Updates:    Patient received sitting up in wc, agreeable to PT. She reports "mild pain" in L knee. NT present to complete colostomy care. PT propelling patient in wc to therapy gym. Sit <> stand x2 in parallel bars with attempted minisquats in standing. She required MaxA + MinA to come to standing. Patient unable to complete minisquats on L LE due to limited L knee flexion and L ankle dorsiflexion. Patient compensating with increased L knee hyperextension and L hip posterior thrust in standing when unable to complete minisquat. Patient requesting to return to bed. MaxA for placement of slideboard, ModA for slideboard transfer. ModA to return supine and reposition in bed. Bed alarm on, call light within reach. Family was not present throughout for family ed.    Therapy Documentation Precautions:  Precautions Precautions: Fall Precaution Comments: WBAT all 4 extremeties as of 1/6, Unrestricted ROM B knees, L ankle, B hips; PROM/AROM R wrist; colostomy; abdominal wound Required Braces or Orthoses: Splint/Cast Splint/Cast: no brace Restrictions Weight Bearing Restrictions: Yes RUE Weight Bearing: Weight bearing as tolerated LUE Weight Bearing: Weight bearing as tolerated LUE Partial Weight Bearing Percentage or Pounds: 50 RLE Weight Bearing: Weight bearing as tolerated LLE Weight Bearing: Weight bearing as tolerated LLE Partial Weight Bearing Percentage or Pounds: 50    Therapy/Group: Individual Therapy  Elizebeth Koller, PT, DPT, CBIS  01/15/2021, 7:47 AM

## 2021-01-15 NOTE — Progress Notes (Signed)
Bladder scan 392. Attempted to cath x2 was unsuccessful

## 2021-01-15 NOTE — Progress Notes (Signed)
Occupational Therapy Session Note  Patient Details  Name: Sara [name] MRN: [id number] Date of Birth: Jul 09, 1966  Today's Date: 01/15/2021 OT Individual Time: 0929-1010 and 1300-1415 OT Individual Time Calculation (min): 41 min and 75 min   Short Term Goals: Week 5:  OT Short Term Goal 1 (Week 5): STG = LTGs due to remaining LOS  Skilled Therapeutic Interventions/Progress Updates:    Pt greeted at time of session supine in bed resting agreeable to OT session, awake today upon entering her room. Extensive discussion at beginning of session regarding DC planning, who will assist with BADL, caregiver training, DME needs, etc. Continue to reiterate to the pt that do not recommend showering on TTB in her home tub at this time for safety. Problem solved ways to wash hair at home, recommending hair washing board at home. Family unable to be present today for family ed/training d/t weather. Pt confirmed she is going to use drop arm BSC from hospital when going home and will decide later if wants to purchase bariatric, print outs have been provided and pt aware of how to purchase. Supine to sit Min A with bed features, slide board bed > w/c with Min/Mod A of 1 today, pt able to help place board after going down onto L elbow. Using chuck pad minimally, pt able to assist with slide board transfer. Set up at sink and performed oral hygiene and grooming. Up in chair for next PT session, all needs met call bell in reach.   Session 2: Pt greeted at time of session supine in bed resting and agreeable to OT session, no complaints throughout session. Educated on importance of ankle DF to prevent contractures for ease of BSC transfers and stand ADL, provided PROM 1x10 with brief hold at end range to improve ROM and as a preparatory activity. Nurse entered at this time and provided med pass. Pt declined washing periarea and buttocks today stating this had been cleaned with nursing performing caths but reviewed process  for [facility] up to reach periarea at home. Supine to sit Min A, encouraged pt to direct care this session with leaning onto L side and assisting with placement of slide board, Min/Mod of 1 for slide board to wheelchair, set up at sink and washed hair with assist of hair washing board, encouraged to purchase for home use to wash hair as not recommended for her to get in shower. UB/LB bathing at sink level with Supervision as pt declined washing feet and declined periarea/buttocks today. Donned new gown supervision and continued to discuss DME needs for home and recommendations. Sitting up for later OT session with call bell in reach all needs met.    Therapy Documentation Precautions:  Precautions Precautions: Fall Precaution Comments: WBAT all 4 extremeties as of 1/6, Unrestricted ROM B knees, L ankle, B hips; PROM/AROM R wrist; colostomy; abdominal wound Required Braces or Orthoses: Splint/Cast Splint/Cast: no brace Restrictions Weight Bearing Restrictions: Yes RUE Weight Bearing: Weight bearing as tolerated LUE Weight Bearing: Weight bearing as tolerated LUE Partial Weight Bearing Percentage or Pounds: 50 RLE Weight Bearing: Weight bearing as tolerated LLE Weight Bearing: Weight bearing as tolerated LLE Partial Weight Bearing Percentage or Pounds: 50     Therapy/Group: Individual Therapy  Sara [name] 01/15/2021, 12:27 PM
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
## 2021-01-15 NOTE — Progress Notes (Signed)
Occupational Therapy Session Note  Patient Details  Name: Sara [name] MRN: [id number] Date of Birth: 1966-09-16  Today's Date: 01/15/2021 OT Individual Time: 9767-3419 OT Individual Time Calculation (min): 32 min   Skilled Therapeutic Interventions/Progress Updates:    Pt greeted in the w/c, happy to report that prior OT had just washed her hair. Pt requesting at this time to wash her lower legs at the sink and then return to bed. Mod A for LB bathing to reach feet. Total A for gripper socks. Min A for slideboard<bed using the step and Mod A to transition to supine. Pt remained comfortably in bed at close of session, left with all needs within reach and bed alarm set.   Therapy Documentation Precautions:  Precautions Precautions: Fall Precaution Comments: WBAT all 4 extremeties as of 1/6, Unrestricted ROM B knees, L ankle, B hips; PROM/AROM R wrist; colostomy; abdominal wound Required Braces or Orthoses: Splint/Cast Splint/Cast: no brace Restrictions Weight Bearing Restrictions: Yes RUE Weight Bearing: Weight bearing as tolerated LUE Weight Bearing: Weight bearing as tolerated LUE Partial Weight Bearing Percentage or Pounds: 50 RLE Weight Bearing: Weight bearing as tolerated LLE Weight Bearing: Weight bearing as tolerated LLE Partial Weight Bearing Percentage or Pounds: 50 Pain: Lt LE pain, pt premedicated Pain Assessment Pain Scale: 0-10 Pain Score: 0-No pain ADL: ADL Grooming: Minimal assistance Where Assessed-Grooming: Edge of bed Upper Body Bathing: Contact guard Where Assessed-Upper Body Bathing: Edge of bed Lower Body Bathing: Maximal assistance (+2) Where Assessed-Lower Body Bathing: Bed level Upper Body Dressing: Minimal assistance Where Assessed-Upper Body Dressing: Edge of bed      Therapy/Group: Individual Therapy  Michaela A Hoffman 01/15/2021, 3:43 PM

## 2021-01-16 ENCOUNTER — Inpatient Hospital Stay (HOSPITAL_COMMUNITY): Payer: Medicaid Other | Admitting: Physical Therapy

## 2021-01-16 ENCOUNTER — Ambulatory Visit (HOSPITAL_COMMUNITY): Payer: Medicaid Other

## 2021-01-16 ENCOUNTER — Inpatient Hospital Stay (HOSPITAL_COMMUNITY): Payer: Medicaid Other | Admitting: Occupational Therapy

## 2021-01-16 ENCOUNTER — Encounter (HOSPITAL_COMMUNITY): Payer: Medicaid Other | Admitting: Occupational Therapy

## 2021-01-16 DIAGNOSIS — G7281 Critical illness myopathy: Secondary | ICD-10-CM | POA: Diagnosis not present
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
## 2021-01-16 NOTE — Progress Notes (Signed)
Physical Therapy Session Note  Patient Details  Name: MAKYAH LAVIGNE MRN: [id number] Date of Birth: 02-20-1966  Today's Date: 01/16/2021 PT Individual Time: 1345-1412 PT Individual Time Calculation (min): 27 min   Short Term Goals: Week 5:  PT Short Term Goal 1 (Week 5): STG=LTG due to ELOS  Skilled Therapeutic Interventions/Progress Updates:    Patient received supine in bed, agreeable to bed-level therapy due to fatigue. She reports no pain when "laying still." PT facilitating PROM to B knees into flexion to patient tolerance. She remains with ~10* flexion L knee and ~25* flexion R knee. Resisted B knee extension 2x12. PT performing prolonged B calf stretch to assist with improving ROM into dorsiflexion. Patient performing ankle alphabet 2x8 letters B. Educating patient on importance of maintaining mobility once home and instructing family members on how to assist with PROM to B knees/ankles. Patient remaining up in bed, bed alarm on, call light within reach.   Therapy Documentation Precautions:  Precautions Precautions: Fall Precaution Comments: WBAT all 4 extremeties as of 1/6, Unrestricted ROM B knees, L ankle, B hips; PROM/AROM R wrist; colostomy; abdominal wound Required Braces or Orthoses: Splint/Cast Splint/Cast: no brace Restrictions Weight Bearing Restrictions: Yes RUE Weight Bearing: Weight bearing as tolerated LUE Weight Bearing: Weight bearing as tolerated LUE Partial Weight Bearing Percentage or Pounds: 50 RLE Weight Bearing: Weight bearing as tolerated LLE Weight Bearing: Weight bearing as tolerated LLE Partial Weight Bearing Percentage or Pounds: 50    Therapy/Group: Individual Therapy  Elizebeth Koller, PT, DPT, CBIS  01/16/2021, 7:48 AM

## 2021-01-16 NOTE — Progress Notes (Signed)
Occupational Therapy Discharge Summary  Patient Details  Name: Sara [name] MRN: [id number] Date of Birth: 11-20-1966   Patient has met 5 of 7 long term goals due to improved activity tolerance, improved balance, postural control, ability to compensate for deficits and improved coordination.  Patient to discharge at overall Mod Assist level.  Patient's care partner is independent to provide the necessary physical assistance at discharge.  Pt is overall at Mod A level and has been limited in her participation during time at CIR d/t pain in BLEs and N/V but has recently made progress toward OT goals. Pt is Min A for bathing at bed level for LB/periarea and sink level for UB and remaining LB. LB dressing at bed level with rolling L/R with Max-total A with pt sometimes able to help don over hips in side lying. Slide board transfers to/from [facility] with Mod A consistently, requiring Max A for clothing assist/managing chuck but able to do own hygiene. Family has been present for several family ed sessions, last one being 1/18 with sister Dorian Pod.   Reasons goals not met: Pt still required 2 helpers at last trial for standing, ADLs have been adapted to seated or bed level position. Pt requires Max-total A for LB dressing at bed level with ability to help thread with reacher but needing significant assist to pull up over hips.   Recommendation:  Patient will benefit from ongoing skilled OT services in home health setting to continue to advance functional skills in the area of BADL and Reduce care partner burden.  Equipment: drop arm BSC  Reasons for discharge: treatment goals met and discharge from hospital  Patient/family agrees with progress made and goals achieved: Yes  OT Discharge Precautions/Restrictions  Precautions Precautions: Fall Precaution Comments: WBAT all 4 extremeties as of 1/6, Unrestricted ROM B knees, L ankle, B hips; PROM/AROM R wrist; colostomy; abdominal wound Splint/Cast: no  brace Restrictions Weight Bearing Restrictions: Yes RUE Weight Bearing: Weight bearing as tolerated LUE Weight Bearing: Weight bearing as tolerated RLE Weight Bearing: Weight bearing as tolerated LLE Weight Bearing: Weight bearing as tolerated Vital Signs Therapy Vitals Temp: 98.3 F (36.8 C) Temp Source: Oral Pulse Rate: 87 Resp: 16 BP: 109/63 Patient Position (if appropriate): Lying Oxygen Therapy SpO2: 99 % O2 Device: Room Air Pain Pain Assessment Pain Scale: 0-10 Pain Score: 0-No pain ADL ADL Equipment Provided: Reacher Eating: Independent Grooming: Setup Where Assessed-Grooming: Sitting at sink Upper Body Bathing: Setup Where Assessed-Upper Body Bathing: Edge of bed Lower Body Bathing: Minimal assistance Where Assessed-Lower Body Bathing: Bed level,Sitting at sink Upper Body Dressing: Supervision/safety Where Assessed-Upper Body Dressing: Edge of bed Lower Body Dressing: Maximal assistance Where Assessed-Lower Body Dressing: Bed level Toileting: Maximal assistance Where Assessed-Toileting: Bedside Commode Toilet Transfer: Moderate assistance Toilet Transfer Method: Theatre manager: Drop arm bedside commode Vision Baseline Vision/History: Wears glasses Patient Visual Report: No change from baseline Vision Assessment?: No apparent visual deficits Perception  Perception: Within Functional Limits Praxis Praxis: Intact Cognition Overall Cognitive Status: Within Functional Limits for tasks assessed Arousal/Alertness: Awake/alert Orientation Level: Oriented X4 Memory: Appears intact Awareness: Appears intact Problem Solving: Appears intact Safety/Judgment: Appears intact Sensation Sensation Light Touch: Appears Intact Hot/Cold: Not tested Proprioception: Not tested Coordination Gross Motor Movements are Fluid and Coordinated: No (limited in BLEs at knees L>R) Fine Motor Movements are Fluid and Coordinated: Yes Coordination and  Movement Description: impaired in LE's due to limited AROM Motor  Motor Motor: Other (comment) (decreased AROM in BLEs, some  atrophy from ulnar nerve damage in R hand as well) Motor - Discharge Observations: general weakness, decreased ROM in LE's Mobility  Bed Mobility Supine to Sit: Minimal Assistance - Patient > 75% Sit to Supine: Moderate Assistance - Patient 50-74%  Trunk/Postural Assessment  Cervical Assessment Cervical Assessment: Within Functional Limits Thoracic Assessment Thoracic Assessment: Exceptions to WFL Lumbar Assessment Lumbar Assessment: Within Functional Limits Postural Control Postural Control: Within Functional Limits  Balance Balance Balance Assessed: Yes Static Sitting Balance Static Sitting - Balance Support: Feet supported Static Sitting - Level of Assistance: 6: Modified independent (Device/Increase time) Dynamic Sitting Balance Dynamic Sitting - Balance Support: Feet supported;No upper extremity supported Dynamic Sitting - Level of Assistance: 5: Stand by assistance Dynamic Sitting - Balance Activities: Lateral lean/weight shifting;Forward lean/weight shifting;Reaching for objects Sitting balance - Comments: lateral leans for slide board, seated UB/LB ADL at sink level and EOB Extremity/Trunk Assessment RUE Assessment RUE Assessment: Within Functional Limits General Strength Comments: WFL but some atrophy at 4th/5th digit from ulnar nerve damage LUE Assessment LUE Assessment: Within Functional Limits   Hannah C Spach 01/16/2021, 4:41 PM  

## 2021-01-16 NOTE — Plan of Care (Signed)
  Problem: RH Balance Goal: LTG Patient will maintain dynamic sitting balance (PT) Description: LTG:  Patient will maintain dynamic sitting balance with assistance during mobility activities (PT) Outcome: Completed/Met   Problem: Sit to Stand Goal: LTG:  Patient will perform sit to stand with assistance level (PT) Description: LTG:  Patient will perform sit to stand with assistance level (PT) Outcome: Completed/Met   Problem: RH Bed Mobility Goal: LTG Patient will perform bed mobility with assist (PT) Description: LTG: Patient will perform bed mobility with assistance, with/without cues (PT). Outcome: Not Met (add Reason) Note: Still needs help with both legs into bed (mod A)   Problem: RH Bed to Chair Transfers Goal: LTG Patient will perform bed/chair transfers w/assist (PT) Description: LTG: Patient will perform bed to chair transfers with assistance (PT). Outcome: Completed/Met   Problem: RH Car Transfers Goal: LTG Patient will perform car transfers with assist (PT) Description: LTG: Patient will perform car transfers with assistance (PT). Outcome: Completed/Met   Problem: RH Wheelchair Mobility Goal: LTG Patient will propel w/c in controlled environment (PT) Description: LTG: Patient will propel wheelchair in controlled environment, # of feet with assist (PT) Outcome: Completed/Met Goal: LTG Patient will propel w/c in home environment (PT) Description: LTG: Patient will propel wheelchair in home environment, # of feet with assistance (PT). Outcome: Completed/Met  Magda Kiel, PT
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
## 2021-01-16 NOTE — Progress Notes (Signed)
Physical Therapy Discharge Summary  Patient Details  Name: Sara [name] MRN: [id number] Date of Birth: [birth year]  Today's Date: 01/16/2021 PT Individual Time: 1045-1200 PT Individual Time Calculation (min): 75 min   Skilled Therapeutic Interventions/Progress Updates:  Patient in supine with sister in the room, reports she just got settled after toileting with OT.  Patient and sister with questions regarding equipment.  Discussed later in session with SW that equipment to be delivered to the house per daughter. Patient in supine and educated sister on HEP for LE ROM and strength to include knee flexion AROM then stretching into flexion AAROM, SLR with assist, hip abduction, ankle stretch into DF with assist, ankle pumps. Handout issued to pt after session.  Also demonstrated scar massage and discussed performing during ADL's.  Patient supine to sit with min A using rail.  Sister able to perform slide board transfers with pt with min cues for w/c set up and confirming board placement.  Patient propelled in w/c x 200'.  Patient assisted in w/c rest of distance to ortho gym.  Demonstrated to sister and she helped with car transfer to simulated minivan height into front passenger seat using stool and +2 A for safety.  Sister reports having a vehicle with back bench seat and discussed sliding in and across seat from drivers side to allow L leg to rest on back seat.  Patient out of car via slide board with min A.  Patient assisted in w/c back to room.  Performed slide board transfer back to bed with sister assist (min A) and sister assisted legs into bed (mod A).  Patient left in supine with needs in reach and sister in the room.  Patient has met 6 of 7 long term goals due to improved activity tolerance, improved balance, increased strength, increased range of motion, decreased pain and ability to compensate for deficits.  Patient to discharge at a wheelchair level [place].   Patient's care partner is  independent to provide the necessary physical assistance at discharge.  Reasons goals not met: Patient still needing help for both legs into bed so mod A level (goal for min A).  Recommendation:  Patient will benefit from ongoing skilled PT services in  no follow up PT at this time, plans for outpatient PT when ready.  Family was educated on HEP and to work on standing activities with 2 person assist to continue to advance safe functional mobility, address ongoing impairments in strength, ROM, transfers, balance, activity tolerance, and minimize fall risk.  Equipment: wheelchair with ELR's, slide board  Reasons for discharge: treatment goals met and discharge from hospital  Patient/family agrees with progress made and goals achieved: Yes  PT Discharge Precautions/Restrictions Precautions Precautions: Fall Precaution Comments: WBAT all 4 extremeties as of 1/6, Unrestricted ROM B knees, L ankle, B hips; PROM/AROM R wrist; colostomy; abdominal wound Restrictions RUE Weight Bearing: Weight bearing as tolerated LUE Weight Bearing: Weight bearing as tolerated RLE Weight Bearing: Weight bearing as tolerated LLE Weight Bearing: Weight bearing as tolerated Pain Pain Assessment Faces Pain Scale: Hurts little more Pain Type: Acute pain Pain Location: Leg Pain Orientation: Left Pain Descriptors / Indicators: Aching Pain Onset: With Activity Vision/Perception     Cognition Overall Cognitive Status: Within Functional Limits for tasks assessed Arousal/Alertness: Awake/alert Attention: Selective Focused Attention: Appears intact Sustained Attention: Appears intact Memory: Appears intact Problem Solving: Appears intact Safety/Judgment: Appears intact Sensation Sensation Light Touch: Appears Intact Hot/Cold: Not tested Proprioception: Not tested Stereognosis: Not tested  Coordination Gross Motor Movements are Fluid and Coordinated: No (in LE's impaired due to limited AROM) Fine Motor  Movements are Fluid and Coordinated: Yes Coordination and Movement Description: impaired in LE's due to limited AROM Motor  Motor Motor: Other (comment) Motor - Discharge Observations: general weakness, decreased ROM in LE's  Mobility Bed Mobility Bed Mobility: Rolling Right;Rolling Left Rolling Right: Supervision/verbal cueing;Other (comment) (with rail) Rolling Left: Supervision/Verbal cueing;Other (comment) (with rail) Supine to Sit: Minimal Assistance - Patient > 75% Sit to Supine: Moderate Assistance - Patient 50-74% Transfers Transfers: Lateral/Scoot Transfers Lateral/Scoot Transfers: Minimal Assistance - Patient > 75% Transfer (Assistive device): Other (Comment) (slide board) Locomotion  Gait Ambulation: No Gait Gait: No Stairs / Additional Locomotion Stairs: No Wheelchair Mobility Wheelchair Mobility: Yes Wheelchair Assistance: Chartered loss adjuster: Both upper extremities Wheelchair Parts Management: Needs assistance Distance: 200'  Trunk/Postural Assessment  Cervical Assessment Cervical Assessment: Within Functional Limits Thoracic Assessment Thoracic Assessment: Exceptions to [facility] (rounded shoulders, kyphosis) Lumbar Assessment Lumbar Assessment: Within Functional Limits Postural Control Postural Control: Within Functional Limits  Balance Balance Balance Assessed: Yes Static Sitting Balance Static Sitting - Balance Support: Feet supported Static Sitting - Level of Assistance: 6: Modified independent (Device/Increase time) Dynamic Sitting Balance Dynamic Sitting - Balance Support: Left upper extremity supported;Feet supported Dynamic Sitting - Level of Assistance: 5: Stand by assistance Dynamic Sitting - Balance Activities: Lateral lean/weight shifting Sitting balance - Comments: leaning on one elbow for board placement Static Standing Balance Static Standing - Balance Support: Bilateral upper extremity supported Static Standing -  Level of Assistance: 3: Mod assist Static Standing - Comment/# of Minutes: up to 1 minute Extremity Assessment      RLE Assessment RLE Assessment: Exceptions to [facility] Passive Range of Motion (PROM) Comments: knee PROM 75 degrees flexion Active Range of Motion (AROM) Comments: AROM 66 degrees flexion General Strength Comments: hip flexion 3-/5, knee extension 4-/5 LLE Assessment LLE Assessment: Exceptions to [facility] Passive Range of Motion (PROM) Comments: knee flexion 54 degrees, ankle DF -15 degrees Active Range of Motion (AROM) Comments: knee flexion 44 degrees, ankle DF -25 degrees General Strength Comments: hip flexion 2+/5, knee extension 3/5, ankle DF 3/5    [address]  [place], PT 01/16/2021, 12:26 PM

## 2021-01-16 NOTE — Progress Notes (Signed)
Patient ID: Sara [name], female   DOB: [birth year], 55 y.o.   MRN: [id number]   All patient DME being delivered to the home form [employer].   [place], [place] [phone]

## 2021-01-16 NOTE — Progress Notes (Signed)
Occupational Therapy Session Note  Patient Details  Name: Sara [name] MRN: [id number] Date of Birth: July 04, 1966  Today's Date: 01/16/2021 OT Individual Time: 1000-1040 and 8850-2774 OT Individual Time Calculation (min): 40 min and 57 min   Short Term Goals: Week 4:  OT Short Term Goal 1 (Week 4): Pt will perform LB dress at bed level with Mod A OT Short Term Goal 1 - Progress (Week 4): Progressing toward goal OT Short Term Goal 2 (Week 4): Pt will perform BSC transfer w/ LRAD with Max A of 1 OT Short Term Goal 2 - Progress (Week 4): Met OT Short Term Goal 3 (Week 4): Pt will perform UB/LB bathing with AE PRN Min A to decrease caregiver burden OT Short Term Goal 3 - Progress (Week 4): Met Week 5:  OT Short Term Goal 1 (Week 5): STG = LTGs due to remaining LOS   Skilled Therapeutic Interventions/Progress Updates:    Pt greeted at time of session supine in bed resting with sister present for family ed. No pain reported. Discussed process for bathing/dressing at bed level, including how to wash periarea with HOB elevated, buttocks rolling L/R, and BLEs with LHS and UB at sink level. Pt and sister feel prepared for bathing and dressing tasks, both most concerned with toileting. Supine to sit EOB Min A for LE management only, note improved ability to maneuver LEs to EOB today. Once EOB, pt able to lean on L elbow for board placement under chuck pad, slide board to [facility] with Mod A, 2nd helper for support. Pt did have continent void of urine, able to perform hygiene set up. Placement of chuck pad under pt with leaning L/R, slide board back to bed same manner as above. Note that focus of toileting session on sequencing of chuck pad placement, lateral leans, using drop arm feature, and having sister perform transfer. Once back in bed, sit to supine Mod A. Discussed with sister and pt regarding having 2nd helper at home, home set up, timed toileting, etc. And both in agreement. Hand off to nursing for  med pass.   Session 2: Pt greeted at time of session supine in bed resting agreeable to OT session but not wanting to get up in wheelchair as she had done several transfers today. Declined ADL/changing gown but did want to remove pants. Rolling L/R with CGA with use of bed rail and encouraged to use UE to pull down pants from hips, able to assist with doffing pants but therapist assist past thigh level. Rolling L/R several times for chuck pad placement for comfort and adjusting brief. Extensive discussion as well regarding DME for home and pt was unsure about ramp for home, explained recommendations for ramp and that PT had already provided print out for family/pt to research and rent for home use. Emptied colostomy bag as well, encouraging pt to perform on her own after therapist holding container. Provided with BUE HEP for strengthening with both band and dumbbell exercises for home use, demonstrated as well and no questions. Supine in bed with alarm on call bell in reach.   Therapy Documentation Precautions:  Precautions Precautions: Fall Precaution Comments: WBAT all 4 extremeties as of 1/6, Unrestricted ROM B knees, L ankle, B hips; PROM/AROM R wrist; colostomy; abdominal wound Required Braces or Orthoses: Splint/Cast Splint/Cast: no brace Restrictions Weight Bearing Restrictions: Yes RUE Weight Bearing: Weight bearing as tolerated LUE Weight Bearing: Weight bearing as tolerated LUE Partial Weight Bearing Percentage or Pounds: 50 RLE Weight  Bearing: Weight bearing as tolerated LLE Weight Bearing: Weight bearing as tolerated LLE Partial Weight Bearing Percentage or Pounds: 50      Therapy/Group: Individual Therapy  Viona Gilmore 01/16/2021, 11:06 AM

## 2021-01-16 NOTE — Progress Notes (Signed)
[facility] not in network for [facility]. Attempted to set appointment with [facility] urological associates who recommended placing foley for d/c and to have outpatient PCP refer patient for follow up. Will not see her without  urologic evaluation.

## 2021-01-16 NOTE — Consult Note (Signed)
WOC Nurse ostomy follow up Patient receiving care in [facility] [phone].  Her sister, Alvino Chapel, present to perform ostomy care procedures Stoma type/location: LUQ colostomy Stomal assessment/size: approximately 1 inch, slightly budded, moist Peristomal assessment: MCS pocket now 0.3 cm depth, no peristomal breakdown Treatment options for stomal/peristomal skin: barrier ring. Former retention suture wound almost completely healed. Area to continue to be covered with a strip of Aquacel and barrier ring Output: 150 ml thin yellow  Ostomy pouching: 2pc. flat Education provided: Alvino Chapel did the entire pouch emptying, removal, cleansing, and placement of new pouch with just a couple of prompts. Enrolled patient in [place] Secure Start Discharge program: Yes   The abdominal wound is almost completely re-epithelialized.  More of the sutures flaked off. Alvino Chapel shown how to cut a Xeroform gauze and ABD pad in half and place over the wound.  Supplies are in room.  I have asked the CSW to talk to the patient and Alvino Chapel. Some vital pieces of equipment have not been delivered for d/c home.  Helmut Muster, RN, MSN, CWOCN, CNS-BC, pager [phone]

## 2021-01-16 NOTE — Progress Notes (Signed)
Patient ID: Sara [name], female   DOB: 11-07-1966, 55 y.o.   MRN: [id number] Team Conference Report to Patient/Family  Team Conference discussion was reviewed with the patient and caregiver, including goals, any changes in plan of care and target discharge date.  Patient and caregiver express understanding and are in agreement.  The patient has a target discharge date of 01/17/21.  Andria Rhein 01/16/2021, 2:58 PM

## 2021-01-17 DIAGNOSIS — R339 Retention of urine, unspecified: Secondary | ICD-10-CM | POA: Diagnosis not present

## 2021-01-17 DIAGNOSIS — Z515 Encounter for palliative care: Secondary | ICD-10-CM | POA: Diagnosis not present

## 2021-01-17 DIAGNOSIS — G7281 Critical illness myopathy: Secondary | ICD-10-CM | POA: Diagnosis not present

## 2021-01-17 DIAGNOSIS — R5381 Other malaise: Secondary | ICD-10-CM | POA: Diagnosis not present

## 2021-01-17 DIAGNOSIS — R531 Weakness: Secondary | ICD-10-CM | POA: Diagnosis not present

## 2021-01-17 MED ORDER — TRAMADOL HCL 50 MG PO TABS: 100.0000 mg | ORAL_TABLET | Freq: Four times a day (QID) | ORAL | 0 refills | Status: DC | PRN

## 2021-01-17 MED ORDER — MAGNESIUM OXIDE 400 (241.3 MG) MG PO TABS: 800.0000 mg | ORAL_TABLET | Freq: Two times a day (BID) | ORAL | 0 refills | Status: DC

## 2021-01-17 MED ORDER — ONDANSETRON HCL 4 MG PO TABS: 4.0000 mg | ORAL_TABLET | Freq: Three times a day (TID) | ORAL | 0 refills | Status: DC

## 2021-01-17 MED ORDER — CETAPHIL MOISTURIZING EX LOTN: TOPICAL_LOTION | CUTANEOUS | 0 refills | Status: DC | PRN

## 2021-01-17 MED ORDER — TAMSULOSIN HCL 0.4 MG PO CAPS: 0.8000 mg | ORAL_CAPSULE | Freq: Every day | ORAL | 0 refills | Status: DC

## 2021-01-17 MED ORDER — FLUTICASONE PROPIONATE 50 MCG/ACT NA SUSP: 2.0000 | Freq: Every day | NASAL | 2 refills | Status: DC

## 2021-01-17 MED ORDER — TRAZODONE HCL 50 MG PO TABS: 25.0000 mg | ORAL_TABLET | Freq: Every evening | ORAL | 0 refills | Status: DC | PRN

## 2021-01-17 MED ORDER — SUCRALFATE 1 GM/10ML PO SUSP: 1.0000 g | Freq: Three times a day (TID) | ORAL | 0 refills | Status: DC

## 2021-01-17 MED ORDER — PANTOPRAZOLE SODIUM 40 MG PO TBEC: 40.0000 mg | DELAYED_RELEASE_TABLET | Freq: Every day | ORAL | 0 refills | Status: DC

## 2021-01-17 MED ORDER — LIDOCAINE 5 % EX PTCH: 3.0000 | MEDICATED_PATCH | Freq: Every day | CUTANEOUS | 0 refills | Status: DC

## 2021-01-17 MED ORDER — VORTIOXETINE HBR 20 MG PO TABS: 20.0000 mg | ORAL_TABLET | Freq: Every day | ORAL | 0 refills | Status: DC

## 2021-01-17 MED ORDER — GABAPENTIN 300 MG PO CAPS: 600.0000 mg | ORAL_CAPSULE | Freq: Every day | ORAL | 0 refills | Status: DC

## 2021-01-17 MED ORDER — VITAMIN D3 25 MCG PO TABS: 4000.0000 [IU] | ORAL_TABLET | Freq: Every day | ORAL | 0 refills | Status: DC

## 2021-01-17 MED ORDER — ASCORBIC ACID 500 MG PO TABS: 500.0000 mg | ORAL_TABLET | Freq: Two times a day (BID) | ORAL | 0 refills | Status: DC

## 2021-01-17 MED ORDER — DICLOFENAC SODIUM 1 % EX GEL: 2.0000 g | Freq: Two times a day (BID) | CUTANEOUS | 0 refills | Status: DC

## 2021-01-17 MED ORDER — POTASSIUM CHLORIDE CRYS ER 20 MEQ PO TBCR: 20.0000 meq | EXTENDED_RELEASE_TABLET | Freq: Two times a day (BID) | ORAL | 0 refills | Status: DC

## 2021-01-17 MED ORDER — ZINC OXIDE 12.8 % EX OINT: TOPICAL_OINTMENT | Freq: Three times a day (TID) | CUTANEOUS | 0 refills | Status: DC

## 2021-01-17 NOTE — Progress Notes (Signed)
This chaplain is present for F/U spiritual care before Pt. d/c.    The Pt. is anticipating her daughters arrival to gather her belongings.  The chaplain listens as the Pt. shares her excitement about the reunion with family and pets.    The Pt. accepts prayer and words of encouragement for her journey.

## 2021-01-17 NOTE — Patient Care Conference (Signed)
Inpatient RehabilitationTeam Conference and Plan of Care Update Date: 01/16/2021   Time: 12:02 PM    Patient Name: Sara [name]      Medical Record Number: [id number]  Date of Birth: 01-20-1966 Sex: Female         Room/Bed: 4W23C/4W23C-01 Payor Info: Payor: [place] MEDICAID PREPAID HEALTH PLAN / Plan: [place] MEDICAID [facility] / Product Type: *No Product type* /    Admit Date/Time:  12/14/2020  3:55 PM  Primary Diagnosis:  Intensive care (ICU) myopathy  Hospital Problems: Principal Problem:   Intensive care (ICU) myopathy Active Problems:   Debility   Malnutrition of moderate degree   Urinary retention   Hypokalemia   Hypomagnesemia   Multiple trauma   Postoperative pain   Acute lower UTI    Expected Discharge Date: Expected Discharge Date: 01/17/21  Team Members Present: Physician leading conference: Dr. Genice Rouge Care Coodinator Present: Kennyth Arnold, RN, BSN, CRRN;Christina Bigfoot, BSW Nurse Present: Adam Phenix, LPN PT Present: Otelia Sergeant, PT OT Present: Earleen Newport, OT PPS Coordinator present : Edson Snowball, Park Breed, SLP     Current Status/Progress Goal Weekly Team Focus  Bowel/Bladder   Ostomy LLQ. I&O cath Q6 no void  time toilet while awake; increase [place] care with ostomy  Assess Q shift and PRN   Swallow/Nutrition/ Hydration             ADL's   Min A bathing with LB at bed level and UB sink level with adaptations and compensatory techniques, slide board Min/Mod with chuck pad, total A LB dress bed level, improved participation  Min overall including LB ADLs and ADL transfers  continue lateral leans/core strength, BSC transfers and sequencing of pad placement/clothing, family ed if able, endurance, ADL retraining   Mobility   min A slide board transfer, +2 STS in //, standing ~1 min, supervision bed mobility  min A slide board transfers, w/c mobility S, increasing standing time  LE ROM and strength, standing tolerance and STS, WC mob, and  family edu, slide board transfers   Communication             Safety/Cognition/ Behavioral Observations            Pain   C/O pain to LLE; Lidocaine patch, Scheduled tramadol, PRN oxy  pain level >4/10  Assess Q shift and   Skin   X Sugical incisions; Midline wound Qdrsg; small wound beside ostomy  Pt wounds will continue to heal and remain free of new skin break down/infection  Asses Q shift and PRN     Discharge Planning:  Discharging to mothers home home with family assistance from dauhgter, mother and sister (daughter arranging 27/7 schedule with family) NO DME, 2 Level home, will remain on 1st 6steps with railings   Team Discussion: Magnesium is causing loose stools, but the magnesium is needed. She can get up to the [facility] to void to family has received transfer training. Family will have to purchase certain materials like gloves and other such items on their own.  Patient on target to meet rehab goals: Family education with OT went well. Transfers require a lot of sequencing. She is a min assist slide board transfers. Supervision for W/C mobility.  *See Care Plan and progress notes for long and short-term goals.   Revisions to Treatment Plan:  Patient to have f/u with urology.  Teaching Needs: Family education, wound care education, ostomy education, transfer training, weight bearing precautions.  Current Barriers to Discharge: Inaccessible home  environment, Decreased caregiver support, Home enviroment access/layout, Wound care, Lack of/limited family support, Weight, Weight bearing restrictions, Behavior and Nutritional means  Possible Resolutions to Barriers: Continue current medications, offer nutritional support, weight bearing precautions, provide emotional support to patient and family.     Medical Summary Current Status: colostomy;  needs in/out sometimes if doesnt void; main concern materials for ostomy;  Barriers to Discharge: Wound care;Behavior;Decreased  family/caregiver support;Home enviroment access/layout;Other (comments)  Barriers to Discharge Comments: colostomy- trained/family ed done for colostomy/voiding, etc Possible Resolutions to [employer]: will arrange for f/u with urology since needs [facility] to void; making sure got equipment- ready for d/c tomorrow 1/19- still needs wound care for abd wound   Continued Need for Acute Rehabilitation Level of Care: The patient requires daily medical management by a physician with specialized training in physical medicine and rehabilitation for the following reasons: Direction of a multidisciplinary physical rehabilitation program to maximize functional independence : Yes Medical management of patient stability for increased activity during participation in an intensive rehabilitation regime.: Yes Analysis of laboratory values and/or radiology reports with any subsequent need for medication adjustment and/or medical intervention. : Yes   I attest that I was present, lead the team conference, and concur with the assessment and plan of the team.   Tennis Must 01/17/2021, 10:58 AM
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
## 2021-01-17 NOTE — Progress Notes (Signed)
Patient ID: Sara [name], female   DOB: 1966/09/22, 55 y.o.   MRN: [id number]  Patient transitioned to inpatient CIR rehabilitation unit 3 weeks ago for intensive rehablilitation.   Medical records reviewed today,  NP visited patient at the bedside with [facility].  She is sitting up in bed, alert and smiling  Again created space and opportunity for patient to explore thoughts and feelings regarding current medical situation.    Sara [name] is "happy" to be going home this week.  She will be at there sister's home with several family members helping with her care needs.   Ongoing education regarding the importance of  personal responsibility in treatment plan.  Again introduced mind, body, spirit and positive talk/ affirmations.   Motivational techniques offered  Ongoing education regarding the importance of movement and mobility.  Demonstrated active range of motion exercises specific to simple arm and leg lifts which Sara [name] can incorporate throughout the day. Education offered on the importance of emptying her bladder thoroughly.  Encouraged Sara [name] to get out of bed to the bedside commode or to the bathroom every time she needs to urinate.  Emotional support offered.  Questions and concerns addressed     PMT will continue to support holistically      Recommend outpatient community-based palliative on discharge.  Total time spent on the unit was 35  minutes  Greater than 50% of the time was spent in counseling and coordination of care  [name] NP  Palliative Medicine Team Team Phone # [phone] Pager [phone]

## 2021-01-17 NOTE — Progress Notes (Signed)
Patient ID: Sara [name], female   DOB: [birth year], 55 y.o.   MRN: [id number]   Patient transport scheduled for 12 PM. Confirmed address with patient daughter. Daughter will be here this morning to pick up patient belongings. All DME to be delivered to patient home, per daughter request. D/C packet left at nursing station. Sw followed up with daughter, no questions or concerns.    [place], [place] [phone]

## 2021-01-17 NOTE — Discharge Instructions (Signed)
Inpatient Rehab Discharge Instructions  Sara [name] Discharge date and time:  01/18/20  Activities/Precautions/ Functional Status: Activity: no lifting, driving, or strenuous exercise till cleared by MD Diet: regular diet Wound Care: Routine colostomy care                --cleanse abdominal incision with soap and water. Pat dry, cover with xeroform gauze and half of ABD pad. Change xeroform daily and pad every 1-2 days as soiled.     Functional status:  ___ No restrictions     ___ Walk up steps independently ___ 24/7 supervision/assistance   ___ Walk up steps with assistance _X__ Intermittent supervision/assistance  ___ Bathe/dress independently ___ Walk with walker     _X__ Bathe/dress with assistance ___ Walk Independently    ___ Shower independently ___ Walk with assistance    ___ Shower with assistance _X__ No alcohol     ___ Return to work/school ________   Special Instructions: 1. Needs to get to bedside commode every  4-6 hours to urinate. If you do not get up you will not be able to empty, have retention of urine which will cause infection and kidney problems in the long run.  2. Need to stay out of bed for couple of hours 3-4 times a day.  3. Continue home exercise 2-3 times a day.    My questions have been answered and I understand these instructions. I will adhere to these goals and the provided educational materials after my discharge from the hospital.  Patient/Caregiver Signature _______________________________ Date __________  Clinician Signature _______________________________________ Date __________  Please bring this form and your medication list with you to all your follow-up doctor's appointments.
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
## 2021-01-17 NOTE — Progress Notes (Signed)
Recreational Therapy Discharge Summary Patient Details  Name: SHACARRA CHOE MRN: [id number] Date of Birth: [birth year] Today's Date: 01/17/2021  Long term goals set: 1  Long term goals met: 1  Comments on progress toward goals: Pt is discharging home today with 24 hour supervision/assistance from family.  Pt met supervision level TR goal.with focus on leisure education, activity analysis/modifications, relaxation strategies and safety with pet care.    Reasons goals not met: n/a  Equipment acquired: n/a  Reasons for discharge: discharge from hospital  Patient/family agrees with progress made and goals achieved: Yes  Vlada Uriostegui 01/17/2021, 8:41 AM

## 2021-01-22 DIAGNOSIS — E876 Hypokalemia: Secondary | ICD-10-CM | POA: Diagnosis not present

## 2021-01-22 DIAGNOSIS — G8929 Other chronic pain: Secondary | ICD-10-CM | POA: Diagnosis not present

## 2021-01-22 DIAGNOSIS — I10 Essential (primary) hypertension: Secondary | ICD-10-CM | POA: Diagnosis not present

## 2021-01-22 DIAGNOSIS — E785 Hyperlipidemia, unspecified: Secondary | ICD-10-CM | POA: Diagnosis not present

## 2021-01-30 DIAGNOSIS — Z419 Encounter for procedure for purposes other than remedying health state, unspecified: Secondary | ICD-10-CM | POA: Diagnosis not present

## 2021-02-06 DIAGNOSIS — Z434 Encounter for attention to other artificial openings of digestive tract: Secondary | ICD-10-CM | POA: Diagnosis not present

## 2021-02-12 ENCOUNTER — Other Ambulatory Visit: Payer: Self-pay | Admitting: Physical Medicine and Rehabilitation

## 2021-02-17 ENCOUNTER — Other Ambulatory Visit: Payer: Self-pay | Admitting: Physical Medicine and Rehabilitation

## 2021-02-17 DIAGNOSIS — G7281 Critical illness myopathy: Secondary | ICD-10-CM | POA: Diagnosis not present

## 2021-02-23 ENCOUNTER — Encounter: Payer: Medicaid Other | Admitting: Physical Medicine and Rehabilitation

## 2021-02-27 DIAGNOSIS — Z419 Encounter for procedure for purposes other than remedying health state, unspecified: Secondary | ICD-10-CM | POA: Diagnosis not present

## 2021-03-02 DIAGNOSIS — E876 Hypokalemia: Secondary | ICD-10-CM | POA: Diagnosis not present

## 2021-03-02 DIAGNOSIS — I10 Essential (primary) hypertension: Secondary | ICD-10-CM | POA: Diagnosis not present

## 2021-03-02 DIAGNOSIS — N390 Urinary tract infection, site not specified: Secondary | ICD-10-CM | POA: Diagnosis not present

## 2021-03-02 DIAGNOSIS — J1189 Influenza due to unidentified influenza virus with other manifestations: Secondary | ICD-10-CM | POA: Diagnosis not present

## 2021-03-09 DIAGNOSIS — N390 Urinary tract infection, site not specified: Secondary | ICD-10-CM | POA: Diagnosis not present

## 2021-03-12 ENCOUNTER — Other Ambulatory Visit: Payer: Self-pay | Admitting: Physical Medicine and Rehabilitation

## 2021-03-16 ENCOUNTER — Other Ambulatory Visit: Payer: Self-pay | Admitting: Physical Medicine and Rehabilitation

## 2021-03-16 DIAGNOSIS — E876 Hypokalemia: Secondary | ICD-10-CM | POA: Diagnosis not present

## 2021-03-16 DIAGNOSIS — D519 Vitamin B12 deficiency anemia, unspecified: Secondary | ICD-10-CM | POA: Diagnosis not present

## 2021-03-16 DIAGNOSIS — E038 Other specified hypothyroidism: Secondary | ICD-10-CM | POA: Diagnosis not present

## 2021-03-16 DIAGNOSIS — I10 Essential (primary) hypertension: Secondary | ICD-10-CM | POA: Diagnosis not present

## 2021-03-16 DIAGNOSIS — R110 Nausea: Secondary | ICD-10-CM | POA: Diagnosis not present

## 2021-03-16 DIAGNOSIS — E039 Hypothyroidism, unspecified: Secondary | ICD-10-CM | POA: Diagnosis not present

## 2021-03-16 DIAGNOSIS — F32A Depression, unspecified: Secondary | ICD-10-CM | POA: Diagnosis not present

## 2021-03-16 DIAGNOSIS — E559 Vitamin D deficiency, unspecified: Secondary | ICD-10-CM | POA: Diagnosis not present

## 2021-03-16 DIAGNOSIS — K219 Gastro-esophageal reflux disease without esophagitis: Secondary | ICD-10-CM | POA: Diagnosis not present

## 2021-03-17 DIAGNOSIS — G7281 Critical illness myopathy: Secondary | ICD-10-CM | POA: Diagnosis not present

## 2021-03-19 ENCOUNTER — Other Ambulatory Visit: Payer: Self-pay | Admitting: Physical Medicine and Rehabilitation

## 2021-03-23 DIAGNOSIS — G7281 Critical illness myopathy: Secondary | ICD-10-CM | POA: Diagnosis not present

## 2021-03-27 DIAGNOSIS — E038 Other specified hypothyroidism: Secondary | ICD-10-CM | POA: Diagnosis not present

## 2021-03-27 DIAGNOSIS — M1990 Unspecified osteoarthritis, unspecified site: Secondary | ICD-10-CM | POA: Diagnosis not present

## 2021-03-27 DIAGNOSIS — E785 Hyperlipidemia, unspecified: Secondary | ICD-10-CM | POA: Diagnosis not present

## 2021-03-30 DIAGNOSIS — Z419 Encounter for procedure for purposes other than remedying health state, unspecified: Secondary | ICD-10-CM | POA: Diagnosis not present

## 2021-04-02 DIAGNOSIS — Z434 Encounter for attention to other artificial openings of digestive tract: Secondary | ICD-10-CM | POA: Diagnosis not present

## 2021-04-17 ENCOUNTER — Other Ambulatory Visit: Payer: Self-pay | Admitting: Physical Medicine and Rehabilitation

## 2021-04-17 DIAGNOSIS — G7281 Critical illness myopathy: Secondary | ICD-10-CM | POA: Diagnosis not present

## 2021-04-23 DIAGNOSIS — G7281 Critical illness myopathy: Secondary | ICD-10-CM | POA: Diagnosis not present

## 2021-04-25 DIAGNOSIS — S52561D Barton's fracture of right radius, subsequent encounter for closed fracture with routine healing: Secondary | ICD-10-CM | POA: Diagnosis not present

## 2021-04-25 DIAGNOSIS — S72461D Displaced supracondylar fracture with intracondylar extension of lower end of right femur, subsequent encounter for closed fracture with routine healing: Secondary | ICD-10-CM | POA: Diagnosis not present

## 2021-04-25 DIAGNOSIS — S92061D Displaced intraarticular fracture of right calcaneus, subsequent encounter for fracture with routine healing: Secondary | ICD-10-CM | POA: Diagnosis not present

## 2021-04-25 DIAGNOSIS — M24562 Contracture, left knee: Secondary | ICD-10-CM | POA: Diagnosis not present

## 2021-04-25 DIAGNOSIS — S72452N Displaced supracondylar fracture without intracondylar extension of lower end of left femur, subsequent encounter for open fracture type IIIA, IIIB, or IIIC with nonunion: Secondary | ICD-10-CM | POA: Diagnosis not present

## 2021-04-27 ENCOUNTER — Other Ambulatory Visit: Payer: Self-pay

## 2021-04-27 ENCOUNTER — Encounter (HOSPITAL_COMMUNITY): Payer: Self-pay

## 2021-04-27 ENCOUNTER — Ambulatory Visit (HOSPITAL_COMMUNITY)
Admission: RE | Admit: 2021-04-27 | Discharge: 2021-04-27 | Disposition: A | Discharge status: Home / Self Care | Payer: Medicaid Other | Source: Ambulatory Visit | Attending: Nurse Practitioner | Admitting: Nurse Practitioner

## 2021-04-27 DIAGNOSIS — K9400 Colostomy complication, unspecified: Secondary | ICD-10-CM

## 2021-04-27 DIAGNOSIS — Z433 Encounter for attention to colostomy: Secondary | ICD-10-CM | POA: Diagnosis not present

## 2021-04-27 NOTE — Progress Notes (Signed)
[facility]   Reason for visit:  LMQ colostomy  Patient admits to weight gain and subsequent issues with pouching.  Upon assessment, her stoma now appears recessed and in a moderate crease at 3 and 9 o'clock.  HPI:  Ileocecetomy ROS  Review of Systems  Constitutional: Positive for unexpected weight change.  Skin: Positive for wound.       Peristomal skin breakdown  All other systems reviewed and are negative.  Vital signs:  BP (!) 95/52   Pulse (!) 59   Temp 98.1 F (36.7 C)   Resp 20   LMP  (LMP Unknown) Comment: spotting only  SpO2 100%  Exam:  Physical Exam  Stoma type/location:  LMQ colostomy Stomal assessment/size:  Oval  2 cm x 3 cm pink and moist, recessed.  Separation from 3 to 5 o'clock, improved.  I cannot insert a cotton tip applicator into this any longer.  I have instructed patient and daughter to stop packing this area with Aquacel.  Watch for any drainage, purulence or further separation and call if this is noted. Peristomal assessment:  Circumferential erythema to peristomal skin.  Will implement stoma powder and skin prep.  Treatment options for stomal/peristomal skin: Stoma powder and skin prep.  Patient has brought in [place] supplies and this includes a 1 piece convex. THis will serve her recessed stoma better.  I apply this today, demonstrating to daughter.  Patient refuses to take a role in ostomy care, deferring to her daughter.  I send daughter home with the pattern to duplicate at next pouch change.  I will contact [employer] rep to get her a belt mailed to her.   Output: soft brown stool Ostomy pouching: 1pc.convex pouch  Skin prep and stoma powder.  Needs belt (on order)  Education provided:  See above.       Impression/dx  Recessed stoma and peristomal breakdown.  Discussion  Need to implement convexity and ostomy belt Plan  Pouched in 1 piece bag today.  Needs ostomy belt.  Call office next week to update on how this new plan is  working. .     Visit time: 55 minutes.   Maple Hudson FNP-BC

## 2021-04-27 NOTE — Discharge Instructions (Signed)
Begin using 1 piece convex pouch.  Protect skin with skin prep and barrier ring/paste  Implement ostomy belt when it arrives.  Call office in one week to discuss how this is working.

## 2021-04-27 NOTE — Consult Note (Incomplete)
[facility]   Reason for visit:  *** HPI:  *** ROS  Review of Systems Vital signs:  LMP  (LMP Unknown) Comment: spotting only Exam:  Physical Exam  Stoma type/location:  *** Stomal assessment/size:  *** Peristomal assessment:  *** Treatment options for stomal/peristomal skin: *** Output: *** Ostomy pouching: 1pc./2pc.  Education provided:  ***    Impression/dx  *** Discussion  *** Plan  ***    Visit time: *** minutes.   Maple Hudson FNP-BC

## 2021-04-29 DIAGNOSIS — G7281 Critical illness myopathy: Secondary | ICD-10-CM | POA: Diagnosis not present

## 2021-04-29 DIAGNOSIS — Z419 Encounter for procedure for purposes other than remedying health state, unspecified: Secondary | ICD-10-CM | POA: Diagnosis not present

## 2021-05-18 ENCOUNTER — Other Ambulatory Visit: Payer: Self-pay | Admitting: Physical Medicine and Rehabilitation

## 2021-05-23 DIAGNOSIS — G7281 Critical illness myopathy: Secondary | ICD-10-CM | POA: Diagnosis not present

## 2021-05-25 NOTE — Addendum Note (Signed)
Encounter addended by: Alphonzo Dublin, FNP on: 05/25/2021 3:29 PM  Actions taken: Delete clinical note

## 2021-05-30 DIAGNOSIS — Z419 Encounter for procedure for purposes other than remedying health state, unspecified: Secondary | ICD-10-CM | POA: Diagnosis not present

## 2021-06-01 DIAGNOSIS — Z434 Encounter for attention to other artificial openings of digestive tract: Secondary | ICD-10-CM | POA: Diagnosis not present

## 2021-06-05 DIAGNOSIS — Z433 Encounter for attention to colostomy: Secondary | ICD-10-CM | POA: Diagnosis not present

## 2021-06-06 DIAGNOSIS — Z434 Encounter for attention to other artificial openings of digestive tract: Secondary | ICD-10-CM | POA: Diagnosis not present

## 2021-06-08 ENCOUNTER — Encounter: Payer: Self-pay | Admitting: Physical Medicine and Rehabilitation

## 2021-06-08 ENCOUNTER — Encounter
Payer: Medicaid Other | Attending: Physical Medicine and Rehabilitation | Admitting: Physical Medicine and Rehabilitation

## 2021-06-08 ENCOUNTER — Other Ambulatory Visit: Payer: Self-pay

## 2021-06-08 VITALS — BP 100/64 | HR 61 | Temp 98.7°F | Ht 65.0 in | Wt 196.0 lb

## 2021-06-08 DIAGNOSIS — R531 Weakness: Secondary | ICD-10-CM | POA: Insufficient documentation

## 2021-06-08 DIAGNOSIS — T07XXXA Unspecified multiple injuries, initial encounter: Secondary | ICD-10-CM | POA: Diagnosis not present

## 2021-06-08 DIAGNOSIS — G7281 Critical illness myopathy: Secondary | ICD-10-CM | POA: Insufficient documentation

## 2021-06-08 DIAGNOSIS — R5381 Other malaise: Secondary | ICD-10-CM | POA: Insufficient documentation
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
## 2021-06-15 ENCOUNTER — Telehealth: Payer: Self-pay

## 2021-06-15 MED ORDER — PANTOPRAZOLE SODIUM 40 MG PO TBEC: 1.0000 | DELAYED_RELEASE_TABLET | Freq: Every day | ORAL | 0 refills | Status: DC

## 2021-06-15 NOTE — Telephone Encounter (Signed)
Rx sent. Patient aware.  

## 2021-06-17 DIAGNOSIS — G7281 Critical illness myopathy: Secondary | ICD-10-CM | POA: Diagnosis not present

## 2021-06-23 DIAGNOSIS — G7281 Critical illness myopathy: Secondary | ICD-10-CM | POA: Diagnosis not present

## 2021-06-29 DIAGNOSIS — Z419 Encounter for procedure for purposes other than remedying health state, unspecified: Secondary | ICD-10-CM | POA: Diagnosis not present

## 2021-07-14 ENCOUNTER — Other Ambulatory Visit: Payer: Self-pay | Admitting: Physical Medicine and Rehabilitation

## 2021-07-19 DIAGNOSIS — Z434 Encounter for attention to other artificial openings of digestive tract: Secondary | ICD-10-CM | POA: Diagnosis not present

## 2021-07-25 DIAGNOSIS — M24562 Contracture, left knee: Secondary | ICD-10-CM | POA: Diagnosis not present

## 2021-07-30 DIAGNOSIS — Z419 Encounter for procedure for purposes other than remedying health state, unspecified: Secondary | ICD-10-CM | POA: Diagnosis not present

## 2021-08-02 DIAGNOSIS — Z933 Colostomy status: Secondary | ICD-10-CM | POA: Diagnosis not present

## 2021-08-02 DIAGNOSIS — K458 Other specified abdominal hernia without obstruction or gangrene: Secondary | ICD-10-CM | POA: Diagnosis not present

## 2021-08-03 LAB — ANTIFUNGAL AST 9 DRUG PANEL
Amphotericin B MIC: 1
Fluconazole Islt MIC: 8
Flucytosine MIC: 0.06
Itraconazole MIC: 0.5
Posaconazole MIC: 1
Voriconazole MIC: 0.25

## 2021-08-13 ENCOUNTER — Other Ambulatory Visit: Payer: Self-pay

## 2021-08-13 ENCOUNTER — Encounter
Payer: Medicaid Other | Attending: Physical Medicine and Rehabilitation | Admitting: Physical Medicine and Rehabilitation

## 2021-08-13 ENCOUNTER — Encounter: Payer: Self-pay | Admitting: Physical Medicine and Rehabilitation

## 2021-08-13 ENCOUNTER — Telehealth: Payer: Self-pay

## 2021-08-13 VITALS — BP 100/67 | HR 62 | Temp 98.2°F | Ht 65.0 in | Wt 215.0 lb

## 2021-08-13 DIAGNOSIS — T07XXXA Unspecified multiple injuries, initial encounter: Secondary | ICD-10-CM | POA: Diagnosis not present

## 2021-08-13 DIAGNOSIS — Z993 Dependence on wheelchair: Secondary | ICD-10-CM | POA: Diagnosis not present

## 2021-08-13 DIAGNOSIS — G7281 Critical illness myopathy: Secondary | ICD-10-CM | POA: Insufficient documentation

## 2021-08-13 DIAGNOSIS — G5621 Lesion of ulnar nerve, right upper limb: Secondary | ICD-10-CM | POA: Diagnosis not present

## 2021-08-13 MED ORDER — DICLOFENAC SODIUM 75 MG PO TBEC: 75.0000 mg | DELAYED_RELEASE_TABLET | Freq: Two times a day (BID) | ORAL | 3 refills | Status: DC | PRN
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
## 2021-08-13 NOTE — Telephone Encounter (Signed)
Rx location changed to [facility].

## 2021-08-16 ENCOUNTER — Other Ambulatory Visit: Payer: Self-pay | Admitting: Physical Medicine and Rehabilitation

## 2021-08-17 DIAGNOSIS — G7281 Critical illness myopathy: Secondary | ICD-10-CM | POA: Diagnosis not present

## 2021-08-20 ENCOUNTER — Other Ambulatory Visit (HOSPITAL_COMMUNITY): Payer: Self-pay | Admitting: Surgery

## 2021-08-20 ENCOUNTER — Other Ambulatory Visit: Payer: Self-pay | Admitting: Surgery

## 2021-08-20 DIAGNOSIS — Z434 Encounter for attention to other artificial openings of digestive tract: Secondary | ICD-10-CM | POA: Diagnosis not present

## 2021-08-20 DIAGNOSIS — K469 Unspecified abdominal hernia without obstruction or gangrene: Secondary | ICD-10-CM

## 2021-08-21 DIAGNOSIS — E038 Other specified hypothyroidism: Secondary | ICD-10-CM | POA: Diagnosis not present

## 2021-08-21 DIAGNOSIS — E876 Hypokalemia: Secondary | ICD-10-CM | POA: Diagnosis not present

## 2021-08-21 DIAGNOSIS — R7303 Prediabetes: Secondary | ICD-10-CM | POA: Diagnosis not present

## 2021-08-21 DIAGNOSIS — I10 Essential (primary) hypertension: Secondary | ICD-10-CM | POA: Diagnosis not present

## 2021-08-21 DIAGNOSIS — E785 Hyperlipidemia, unspecified: Secondary | ICD-10-CM | POA: Diagnosis not present

## 2021-08-28 DIAGNOSIS — I340 Nonrheumatic mitral (valve) insufficiency: Secondary | ICD-10-CM | POA: Diagnosis not present

## 2021-08-28 DIAGNOSIS — F32A Depression, unspecified: Secondary | ICD-10-CM | POA: Diagnosis not present

## 2021-08-28 DIAGNOSIS — M1990 Unspecified osteoarthritis, unspecified site: Secondary | ICD-10-CM | POA: Diagnosis not present

## 2021-08-28 DIAGNOSIS — I509 Heart failure, unspecified: Secondary | ICD-10-CM | POA: Diagnosis not present

## 2021-08-28 DIAGNOSIS — R109 Unspecified abdominal pain: Secondary | ICD-10-CM | POA: Diagnosis not present

## 2021-08-30 ENCOUNTER — Telehealth: Payer: Self-pay

## 2021-08-30 DIAGNOSIS — Z419 Encounter for procedure for purposes other than remedying health state, unspecified: Secondary | ICD-10-CM | POA: Diagnosis not present

## 2021-08-30 NOTE — Telephone Encounter (Signed)
Sara [name] called asking if we can extend her wheelchair orders and hospital bed orders she has upcoming appt in November .

## 2021-08-31 NOTE — Telephone Encounter (Signed)
Please find out from pt what she means- I literally don't know how to do this? ML

## 2021-08-31 NOTE — Telephone Encounter (Signed)
We can send a copy of my last note- she's still actively using both- And I think I stated that in my note- Please let me know how to write this order- or give me a prescription and I can do so- ML

## 2021-08-31 NOTE — Telephone Encounter (Signed)
[facility] is asking for an order from the Dr. stating why Pt still needs wheelchair and hospital bed . So  they can continue to provide services for pt.

## 2021-09-07 NOTE — Telephone Encounter (Signed)
As above.

## 2021-09-07 NOTE — Telephone Encounter (Signed)
I spoke with Sara [name] and she reports that because of back issues and hernias she needs the hospital bed. I have informed her that she will have to come in and be reassessed before we can make a claim of medical necessity.  She reports that she is on vacation right now and will call back and schedule when her daughter has her schedule.

## 2021-09-11 ENCOUNTER — Ambulatory Visit: Payer: Medicaid Other | Admitting: Physical Therapy

## 2021-09-14 ENCOUNTER — Ambulatory Visit: Admission: RE | Admit: 2021-09-14 | Payer: Medicaid Other | Source: Ambulatory Visit

## 2021-09-17 DIAGNOSIS — G7281 Critical illness myopathy: Secondary | ICD-10-CM | POA: Diagnosis not present

## 2021-09-19 ENCOUNTER — Encounter: Payer: Self-pay | Admitting: Physical Therapy

## 2021-09-19 ENCOUNTER — Other Ambulatory Visit: Payer: Self-pay

## 2021-09-19 ENCOUNTER — Ambulatory Visit: Payer: Medicaid Other | Attending: Physical Medicine and Rehabilitation | Admitting: Physical Therapy

## 2021-09-19 DIAGNOSIS — R262 Difficulty in walking, not elsewhere classified: Secondary | ICD-10-CM | POA: Insufficient documentation

## 2021-09-19 DIAGNOSIS — R2689 Other abnormalities of gait and mobility: Secondary | ICD-10-CM | POA: Diagnosis not present

## 2021-09-19 DIAGNOSIS — M6281 Muscle weakness (generalized): Secondary | ICD-10-CM | POA: Insufficient documentation

## 2021-09-19 DIAGNOSIS — R2681 Unsteadiness on feet: Secondary | ICD-10-CM | POA: Insufficient documentation

## 2021-09-20 ENCOUNTER — Ambulatory Visit
Admission: RE | Admit: 2021-09-20 | Discharge: 2021-09-20 | Disposition: A | Discharge status: Home / Self Care | Payer: Medicaid Other | Source: Ambulatory Visit | Attending: Surgery | Admitting: Surgery

## 2021-09-20 DIAGNOSIS — K469 Unspecified abdominal hernia without obstruction or gangrene: Secondary | ICD-10-CM | POA: Diagnosis not present

## 2021-09-20 MED ORDER — IOHEXOL 350 MG/ML SOLN
100.0000 mL | Freq: Once | INTRAVENOUS | Status: AC | PRN
  Administered 2021-09-20: 100 mL via INTRAVENOUS
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
## 2021-09-25 ENCOUNTER — Other Ambulatory Visit: Payer: Self-pay

## 2021-09-25 ENCOUNTER — Ambulatory Visit: Payer: Medicaid Other | Admitting: Physical Therapy

## 2021-09-25 DIAGNOSIS — R262 Difficulty in walking, not elsewhere classified: Secondary | ICD-10-CM | POA: Diagnosis not present

## 2021-09-25 DIAGNOSIS — M6281 Muscle weakness (generalized): Secondary | ICD-10-CM | POA: Diagnosis not present

## 2021-09-25 DIAGNOSIS — R2689 Other abnormalities of gait and mobility: Secondary | ICD-10-CM | POA: Diagnosis not present

## 2021-09-25 DIAGNOSIS — R2681 Unsteadiness on feet: Secondary | ICD-10-CM | POA: Diagnosis not present
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
## 2021-09-27 ENCOUNTER — Ambulatory Visit: Payer: Medicaid Other | Admitting: Physical Therapy

## 2021-09-29 DIAGNOSIS — Z419 Encounter for procedure for purposes other than remedying health state, unspecified: Secondary | ICD-10-CM | POA: Diagnosis not present

## 2021-10-04 ENCOUNTER — Ambulatory Visit: Payer: Medicaid Other | Admitting: Physical Therapy

## 2021-10-09 ENCOUNTER — Other Ambulatory Visit: Payer: Self-pay

## 2021-10-09 ENCOUNTER — Ambulatory Visit: Payer: Medicaid Other | Attending: Physical Medicine and Rehabilitation | Admitting: Physical Therapy

## 2021-10-09 DIAGNOSIS — R269 Unspecified abnormalities of gait and mobility: Secondary | ICD-10-CM | POA: Diagnosis not present

## 2021-10-09 DIAGNOSIS — M6281 Muscle weakness (generalized): Secondary | ICD-10-CM | POA: Insufficient documentation

## 2021-10-09 DIAGNOSIS — R2689 Other abnormalities of gait and mobility: Secondary | ICD-10-CM | POA: Diagnosis not present

## 2021-10-09 DIAGNOSIS — R2681 Unsteadiness on feet: Secondary | ICD-10-CM | POA: Diagnosis not present

## 2021-10-09 DIAGNOSIS — R262 Difficulty in walking, not elsewhere classified: Secondary | ICD-10-CM | POA: Diagnosis not present
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
## 2021-10-11 ENCOUNTER — Ambulatory Visit: Payer: Medicaid Other

## 2021-10-11 ENCOUNTER — Other Ambulatory Visit: Payer: Self-pay

## 2021-10-11 DIAGNOSIS — R269 Unspecified abnormalities of gait and mobility: Secondary | ICD-10-CM

## 2021-10-11 DIAGNOSIS — R262 Difficulty in walking, not elsewhere classified: Secondary | ICD-10-CM | POA: Diagnosis not present

## 2021-10-11 DIAGNOSIS — M6281 Muscle weakness (generalized): Secondary | ICD-10-CM

## 2021-10-11 DIAGNOSIS — R2689 Other abnormalities of gait and mobility: Secondary | ICD-10-CM | POA: Diagnosis not present

## 2021-10-11 DIAGNOSIS — R2681 Unsteadiness on feet: Secondary | ICD-10-CM
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
## 2021-10-12 DIAGNOSIS — Z434 Encounter for attention to other artificial openings of digestive tract: Secondary | ICD-10-CM | POA: Diagnosis not present

## 2021-10-17 DIAGNOSIS — G7281 Critical illness myopathy: Secondary | ICD-10-CM | POA: Diagnosis not present

## 2021-10-18 ENCOUNTER — Encounter: Payer: Self-pay | Admitting: Physical Therapy

## 2021-10-18 ENCOUNTER — Ambulatory Visit: Payer: Medicaid Other

## 2021-10-18 ENCOUNTER — Other Ambulatory Visit: Payer: Self-pay

## 2021-10-18 DIAGNOSIS — R2689 Other abnormalities of gait and mobility: Secondary | ICD-10-CM | POA: Diagnosis not present

## 2021-10-18 DIAGNOSIS — R269 Unspecified abnormalities of gait and mobility: Secondary | ICD-10-CM

## 2021-10-18 DIAGNOSIS — R2681 Unsteadiness on feet: Secondary | ICD-10-CM | POA: Diagnosis not present

## 2021-10-18 DIAGNOSIS — R262 Difficulty in walking, not elsewhere classified: Secondary | ICD-10-CM

## 2021-10-18 DIAGNOSIS — M6281 Muscle weakness (generalized): Secondary | ICD-10-CM
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
## 2021-10-23 ENCOUNTER — Other Ambulatory Visit: Payer: Self-pay

## 2021-10-23 ENCOUNTER — Ambulatory Visit: Payer: Medicaid Other

## 2021-10-23 DIAGNOSIS — R262 Difficulty in walking, not elsewhere classified: Secondary | ICD-10-CM | POA: Diagnosis not present

## 2021-10-23 DIAGNOSIS — M6281 Muscle weakness (generalized): Secondary | ICD-10-CM

## 2021-10-23 DIAGNOSIS — R2689 Other abnormalities of gait and mobility: Secondary | ICD-10-CM | POA: Diagnosis not present

## 2021-10-23 DIAGNOSIS — R2681 Unsteadiness on feet: Secondary | ICD-10-CM | POA: Diagnosis not present

## 2021-10-23 DIAGNOSIS — R269 Unspecified abnormalities of gait and mobility: Secondary | ICD-10-CM | POA: Diagnosis not present
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
## 2021-10-24 DIAGNOSIS — Z434 Encounter for attention to other artificial openings of digestive tract: Secondary | ICD-10-CM | POA: Diagnosis not present

## 2021-10-30 ENCOUNTER — Ambulatory Visit: Payer: Medicaid Other

## 2021-10-30 DIAGNOSIS — Z419 Encounter for procedure for purposes other than remedying health state, unspecified: Secondary | ICD-10-CM | POA: Diagnosis not present

## 2021-11-01 ENCOUNTER — Ambulatory Visit: Payer: Medicaid Other | Admitting: Physical Therapy

## 2021-11-06 ENCOUNTER — Other Ambulatory Visit: Payer: Self-pay

## 2021-11-06 ENCOUNTER — Ambulatory Visit: Payer: Medicaid Other | Attending: Physical Medicine and Rehabilitation

## 2021-11-06 DIAGNOSIS — R2689 Other abnormalities of gait and mobility: Secondary | ICD-10-CM | POA: Insufficient documentation

## 2021-11-06 DIAGNOSIS — R262 Difficulty in walking, not elsewhere classified: Secondary | ICD-10-CM | POA: Insufficient documentation

## 2021-11-06 DIAGNOSIS — M6281 Muscle weakness (generalized): Secondary | ICD-10-CM | POA: Insufficient documentation

## 2021-11-06 DIAGNOSIS — R269 Unspecified abnormalities of gait and mobility: Secondary | ICD-10-CM | POA: Diagnosis not present

## 2021-11-06 DIAGNOSIS — R2681 Unsteadiness on feet: Secondary | ICD-10-CM | POA: Diagnosis not present
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
## 2021-11-08 ENCOUNTER — Ambulatory Visit: Payer: Medicaid Other

## 2021-11-12 ENCOUNTER — Encounter: Payer: Medicaid Other | Admitting: Physical Medicine and Rehabilitation

## 2021-11-12 DIAGNOSIS — Z434 Encounter for attention to other artificial openings of digestive tract: Secondary | ICD-10-CM | POA: Diagnosis not present

## 2021-11-13 ENCOUNTER — Ambulatory Visit: Payer: Medicaid Other

## 2021-11-13 ENCOUNTER — Other Ambulatory Visit: Payer: Self-pay

## 2021-11-13 DIAGNOSIS — R269 Unspecified abnormalities of gait and mobility: Secondary | ICD-10-CM

## 2021-11-13 DIAGNOSIS — R2681 Unsteadiness on feet: Secondary | ICD-10-CM | POA: Diagnosis not present

## 2021-11-13 DIAGNOSIS — R262 Difficulty in walking, not elsewhere classified: Secondary | ICD-10-CM | POA: Diagnosis not present

## 2021-11-13 DIAGNOSIS — M6281 Muscle weakness (generalized): Secondary | ICD-10-CM

## 2021-11-13 DIAGNOSIS — R2689 Other abnormalities of gait and mobility: Secondary | ICD-10-CM | POA: Diagnosis not present
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
## 2021-11-15 ENCOUNTER — Ambulatory Visit: Payer: Medicaid Other

## 2021-11-17 DIAGNOSIS — G7281 Critical illness myopathy: Secondary | ICD-10-CM | POA: Diagnosis not present

## 2021-11-27 ENCOUNTER — Ambulatory Visit: Payer: Medicaid Other

## 2021-11-28 DIAGNOSIS — S72452N Displaced supracondylar fracture without intracondylar extension of lower end of left femur, subsequent encounter for open fracture type IIIA, IIIB, or IIIC with nonunion: Secondary | ICD-10-CM | POA: Diagnosis not present

## 2021-11-28 DIAGNOSIS — S92061D Displaced intraarticular fracture of right calcaneus, subsequent encounter for fracture with routine healing: Secondary | ICD-10-CM | POA: Diagnosis not present

## 2021-11-28 DIAGNOSIS — S72461D Displaced supracondylar fracture with intracondylar extension of lower end of right femur, subsequent encounter for closed fracture with routine healing: Secondary | ICD-10-CM | POA: Diagnosis not present

## 2021-11-28 DIAGNOSIS — M24562 Contracture, left knee: Secondary | ICD-10-CM | POA: Diagnosis not present

## 2021-11-29 ENCOUNTER — Ambulatory Visit: Payer: Medicaid Other

## 2021-11-29 DIAGNOSIS — Z419 Encounter for procedure for purposes other than remedying health state, unspecified: Secondary | ICD-10-CM | POA: Diagnosis not present

## 2021-11-30 DIAGNOSIS — E039 Hypothyroidism, unspecified: Secondary | ICD-10-CM | POA: Diagnosis not present

## 2021-11-30 DIAGNOSIS — I10 Essential (primary) hypertension: Secondary | ICD-10-CM | POA: Diagnosis not present

## 2021-11-30 DIAGNOSIS — E669 Obesity, unspecified: Secondary | ICD-10-CM | POA: Diagnosis not present

## 2021-11-30 DIAGNOSIS — E785 Hyperlipidemia, unspecified: Secondary | ICD-10-CM | POA: Diagnosis not present

## 2021-12-06 ENCOUNTER — Ambulatory Visit: Payer: Medicaid Other

## 2021-12-06 ENCOUNTER — Other Ambulatory Visit: Payer: Self-pay | Admitting: Orthopedic Surgery

## 2021-12-06 DIAGNOSIS — S72461D Displaced supracondylar fracture with intracondylar extension of lower end of right femur, subsequent encounter for closed fracture with routine healing: Secondary | ICD-10-CM

## 2021-12-11 ENCOUNTER — Ambulatory Visit: Payer: Medicaid Other

## 2021-12-11 ENCOUNTER — Ambulatory Visit: Payer: Medicaid Other | Attending: Physical Medicine and Rehabilitation

## 2021-12-11 ENCOUNTER — Other Ambulatory Visit: Payer: Self-pay

## 2021-12-11 DIAGNOSIS — R269 Unspecified abnormalities of gait and mobility: Secondary | ICD-10-CM | POA: Insufficient documentation

## 2021-12-11 DIAGNOSIS — R2681 Unsteadiness on feet: Secondary | ICD-10-CM | POA: Insufficient documentation

## 2021-12-11 DIAGNOSIS — R262 Difficulty in walking, not elsewhere classified: Secondary | ICD-10-CM | POA: Diagnosis not present

## 2021-12-11 DIAGNOSIS — M6281 Muscle weakness (generalized): Secondary | ICD-10-CM | POA: Diagnosis not present
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
## 2021-12-13 ENCOUNTER — Other Ambulatory Visit: Payer: Self-pay

## 2021-12-13 ENCOUNTER — Ambulatory Visit: Payer: Medicaid Other

## 2021-12-13 DIAGNOSIS — R262 Difficulty in walking, not elsewhere classified: Secondary | ICD-10-CM | POA: Diagnosis not present

## 2021-12-13 DIAGNOSIS — R269 Unspecified abnormalities of gait and mobility: Secondary | ICD-10-CM | POA: Diagnosis not present

## 2021-12-13 DIAGNOSIS — M6281 Muscle weakness (generalized): Secondary | ICD-10-CM | POA: Diagnosis not present

## 2021-12-13 DIAGNOSIS — R2681 Unsteadiness on feet: Secondary | ICD-10-CM
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
## 2021-12-14 ENCOUNTER — Ambulatory Visit
Admission: RE | Admit: 2021-12-14 | Discharge: 2021-12-14 | Disposition: A | Discharge status: Home / Self Care | Payer: Medicaid Other | Source: Ambulatory Visit | Attending: Orthopedic Surgery | Admitting: Orthopedic Surgery

## 2021-12-14 ENCOUNTER — Other Ambulatory Visit: Payer: Medicaid Other

## 2021-12-14 ENCOUNTER — Other Ambulatory Visit: Payer: Self-pay | Admitting: Orthopedic Surgery

## 2021-12-14 DIAGNOSIS — S72461D Displaced supracondylar fracture with intracondylar extension of lower end of right femur, subsequent encounter for closed fracture with routine healing: Secondary | ICD-10-CM

## 2021-12-14 DIAGNOSIS — S72462D Displaced supracondylar fracture with intracondylar extension of lower end of left femur, subsequent encounter for closed fracture with routine healing: Secondary | ICD-10-CM

## 2021-12-17 DIAGNOSIS — G7281 Critical illness myopathy: Secondary | ICD-10-CM | POA: Diagnosis not present

## 2021-12-18 ENCOUNTER — Other Ambulatory Visit: Payer: Self-pay | Admitting: Nurse Practitioner

## 2021-12-18 DIAGNOSIS — M545 Low back pain, unspecified: Secondary | ICD-10-CM | POA: Diagnosis not present

## 2021-12-18 DIAGNOSIS — E785 Hyperlipidemia, unspecified: Secondary | ICD-10-CM | POA: Diagnosis not present

## 2021-12-18 DIAGNOSIS — M5459 Other low back pain: Secondary | ICD-10-CM | POA: Diagnosis not present

## 2021-12-18 DIAGNOSIS — L2084 Intrinsic (allergic) eczema: Secondary | ICD-10-CM | POA: Diagnosis not present

## 2021-12-18 DIAGNOSIS — M546 Pain in thoracic spine: Secondary | ICD-10-CM | POA: Diagnosis not present

## 2021-12-18 DIAGNOSIS — I340 Nonrheumatic mitral (valve) insufficiency: Secondary | ICD-10-CM | POA: Diagnosis not present

## 2022-01-01 ENCOUNTER — Other Ambulatory Visit: Payer: Self-pay

## 2022-01-01 ENCOUNTER — Ambulatory Visit: Payer: Medicaid Other | Attending: Physical Medicine and Rehabilitation

## 2022-01-01 DIAGNOSIS — T148XXA Other injury of unspecified body region, initial encounter: Secondary | ICD-10-CM | POA: Insufficient documentation

## 2022-01-01 DIAGNOSIS — R5381 Other malaise: Secondary | ICD-10-CM | POA: Diagnosis not present

## 2022-01-01 DIAGNOSIS — R262 Difficulty in walking, not elsewhere classified: Secondary | ICD-10-CM | POA: Insufficient documentation

## 2022-01-01 DIAGNOSIS — R269 Unspecified abnormalities of gait and mobility: Secondary | ICD-10-CM | POA: Diagnosis not present

## 2022-01-01 DIAGNOSIS — R2689 Other abnormalities of gait and mobility: Secondary | ICD-10-CM | POA: Insufficient documentation

## 2022-01-01 DIAGNOSIS — R2681 Unsteadiness on feet: Secondary | ICD-10-CM | POA: Insufficient documentation

## 2022-01-01 DIAGNOSIS — M6281 Muscle weakness (generalized): Secondary | ICD-10-CM | POA: Diagnosis not present
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
## 2022-01-03 ENCOUNTER — Ambulatory Visit: Payer: Medicaid Other

## 2022-01-07 ENCOUNTER — Ambulatory Visit: Payer: Medicaid Other

## 2022-01-09 DIAGNOSIS — S72452N Displaced supracondylar fracture without intracondylar extension of lower end of left femur, subsequent encounter for open fracture type IIIA, IIIB, or IIIC with nonunion: Secondary | ICD-10-CM | POA: Diagnosis not present

## 2022-01-09 DIAGNOSIS — M19071 Primary osteoarthritis, right ankle and foot: Secondary | ICD-10-CM | POA: Diagnosis not present

## 2022-01-10 ENCOUNTER — Ambulatory Visit: Payer: Medicaid Other | Admitting: Physical Therapy

## 2022-01-15 ENCOUNTER — Ambulatory Visit: Payer: Medicaid Other | Admitting: Physical Therapy

## 2022-01-17 ENCOUNTER — Ambulatory Visit: Payer: Medicaid Other | Admitting: Physical Therapy

## 2022-01-17 DIAGNOSIS — G7281 Critical illness myopathy: Secondary | ICD-10-CM | POA: Diagnosis not present

## 2022-01-21 ENCOUNTER — Other Ambulatory Visit: Payer: Self-pay

## 2022-01-21 ENCOUNTER — Ambulatory Visit: Payer: Medicaid Other | Admitting: Physical Therapy

## 2022-01-21 DIAGNOSIS — R5381 Other malaise: Secondary | ICD-10-CM

## 2022-01-21 DIAGNOSIS — R269 Unspecified abnormalities of gait and mobility: Secondary | ICD-10-CM

## 2022-01-21 DIAGNOSIS — M6281 Muscle weakness (generalized): Secondary | ICD-10-CM

## 2022-01-21 DIAGNOSIS — Z434 Encounter for attention to other artificial openings of digestive tract: Secondary | ICD-10-CM | POA: Diagnosis not present

## 2022-01-21 DIAGNOSIS — R2689 Other abnormalities of gait and mobility: Secondary | ICD-10-CM | POA: Diagnosis not present

## 2022-01-21 DIAGNOSIS — R2681 Unsteadiness on feet: Secondary | ICD-10-CM

## 2022-01-21 DIAGNOSIS — R262 Difficulty in walking, not elsewhere classified: Secondary | ICD-10-CM

## 2022-01-21 DIAGNOSIS — T148XXA Other injury of unspecified body region, initial encounter: Secondary | ICD-10-CM
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
## 2022-01-21 NOTE — Progress Notes (Signed)
Surgical Instructions    Your procedure is scheduled on Thursday, January 26th, 2023.   Report to [facility] Main Entrance "A" at 06:00 A.M., then check in with the Admitting office.  Call this number if you have problems the morning of surgery:  [phone]   If you have any questions prior to your surgery date call [phone]: Open Monday-Friday 8am-4pm    Remember:  Do not eat after midnight the night before your surgery  You may drink clear liquids until 05:00 the morning of your surgery.   Clear liquids allowed are: Water, Non-Citrus Juices (without pulp), Carbonated Beverages, Clear Tea, Black Coffee ONLY (NO MILK, CREAM OR POWDERED CREAMER of any kind), and Gatorade    Take these medicines the morning of surgery with A SIP OF WATER:  TRINTELLIX   If needed:    albuterol (VENTOLIN HFA) - please, bring the inhaler with you the day of surgery fluticasone (FLONASE)  ondansetron (ZOFRAN)   As of today, STOP taking any Aspirin (unless otherwise instructed by your surgeon) Aleve, Naproxen, Ibuprofen, Motrin, Advil, Goody's, BC's, all herbal medications, fish oil, and all vitamins.   After your COVID test   You are not required to quarantine however you are required to wear a well-fitting mask when you are out and around people not in your household.  If your mask becomes wet or soiled, replace with a new one.  Wash your hands often with soap and water for 20 seconds or clean your hands with an alcohol-based hand sanitizer that contains at least 60% alcohol.  Do not share personal items.  Notify your provider: if you are in close contact with someone who has COVID  or if you develop a fever of 100.4 or greater, sneezing, cough, sore throat, shortness of breath or body aches.    The day of surgery:          Do not wear jewelry or makeup Do not wear lotions, powders, perfumes, or deodorant. Do not shave 48 hours prior to surgery.   Do not bring valuables to the  hospital. DO Not wear nail polish, gel polish, artificial nails, or any other type of covering on natural nails (fingers and toes) If you have artificial nails or gel coating that need to be removed by a nail salon, please have this removed prior to surgery. Artificial nails or gel coating may interfere with anesthesia's ability to adequately monitor your vital signs.              [facility] is not responsible for any belongings or valuables.  Do NOT Smoke (Tobacco/Vaping)  24 hours prior to your procedure  If you use a CPAP at night, you may bring your mask for your overnight stay.   Contacts, glasses, hearing aids, dentures or partials may not be worn into surgery, please bring cases for these belongings   For patients admitted to the hospital, discharge time will be determined by your treatment team.   Patients discharged the day of surgery will not be allowed to drive home, and someone needs to stay with them for 24 hours.  NO VISITORS WILL BE ALLOWED IN PRE-OP WHERE PATIENTS ARE PREPPED FOR SURGERY.  ONLY 1 SUPPORT PERSON MAY BE PRESENT IN THE WAITING ROOM WHILE YOU ARE IN SURGERY.  IF YOU ARE TO BE ADMITTED, ONCE YOU ARE IN YOUR ROOM YOU WILL BE ALLOWED TWO (2) VISITORS. 1 (ONE) VISITOR MAY STAY OVERNIGHT BUT MUST ARRIVE TO THE ROOM BY 8pm.  Minor children may have two parents present. Special consideration for safety and communication needs will be reviewed on a case by case basis.  Special instructions:    Oral Hygiene is also important to reduce your risk of infection.  Remember - BRUSH YOUR TEETH THE MORNING OF SURGERY WITH YOUR REGULAR TOOTHPASTE   [facility]- Preparing For Surgery  Before surgery, you can play an important role. Because skin is not sterile, your skin needs to be as free of germs as possible. You can reduce the number of germs on your skin by washing with CHG (chlorahexidine gluconate) Soap before surgery.  CHG is an antiseptic cleaner which kills germs and  bonds with the skin to continue killing germs even after washing.     Please do not use if you have an allergy to CHG or antibacterial soaps. If your skin becomes reddened/irritated stop using the CHG.  Do not shave (including legs and underarms) for at least 48 hours prior to first CHG shower. It is OK to shave your face.  Please follow these instructions carefully.     Shower the NIGHT BEFORE SURGERY and the MORNING OF SURGERY with CHG Soap.   If you chose to wash your hair, wash your hair first as usual with your normal shampoo. After you shampoo, rinse your hair and body thoroughly to remove the shampoo.  Then [employer] and genitals (private parts) with your normal soap and rinse thoroughly to remove soap.  After that Use CHG Soap as you would any other liquid soap. You can apply CHG directly to the skin and wash gently with a scrungie or a clean washcloth.   Apply the CHG Soap to your body ONLY FROM THE NECK DOWN.  Do not use on open wounds or open sores. Avoid contact with your eyes, ears, mouth and genitals (private parts). Wash Face and genitals (private parts)  with your normal soap.   Wash thoroughly, paying special attention to the area where your surgery will be performed.  Thoroughly rinse your body with warm water from the neck down.  DO NOT shower/wash with your normal soap after using and rinsing off the CHG Soap.  Pat yourself dry with a CLEAN TOWEL.  Wear CLEAN PAJAMAS to bed the night before surgery  Place CLEAN SHEETS on your bed the night before your surgery  DO NOT SLEEP WITH PETS.   Day of Surgery:  Take a shower with CHG soap. Wear Clean/Comfortable clothing the morning of surgery Do not apply any deodorants/lotions.   Remember to brush your teeth WITH YOUR REGULAR TOOTHPASTE.   Please read over the following fact sheets that you were given.

## 2022-01-22 ENCOUNTER — Encounter (HOSPITAL_COMMUNITY): Payer: Self-pay

## 2022-01-22 ENCOUNTER — Other Ambulatory Visit: Payer: Self-pay

## 2022-01-22 ENCOUNTER — Encounter (HOSPITAL_COMMUNITY)
Admission: RE | Admit: 2022-01-22 | Discharge: 2022-01-22 | Disposition: A | Discharge status: Home / Self Care | Payer: Medicaid Other | Source: Ambulatory Visit | Attending: Orthopedic Surgery | Admitting: Orthopedic Surgery

## 2022-01-22 VITALS — BP 130/77 | HR 66 | Temp 98.6°F | Resp 18 | Ht 65.0 in | Wt 226.6 lb

## 2022-01-22 DIAGNOSIS — Z20822 Contact with and (suspected) exposure to covid-19: Secondary | ICD-10-CM | POA: Insufficient documentation

## 2022-01-22 DIAGNOSIS — Z01818 Encounter for other preprocedural examination: Secondary | ICD-10-CM | POA: Insufficient documentation

## 2022-01-22 HISTORY — DX: Dyspnea, unspecified: R06.00

## 2022-01-22 LAB — CBC
HCT: 45.5 % (ref 36.0–46.0)
Hemoglobin: 14.2 g/dL (ref 12.0–15.0)
MCH: 30.3 pg (ref 26.0–34.0)
MCHC: 31.2 g/dL (ref 30.0–36.0)
MCV: 97.2 fL (ref 80.0–100.0)
Platelets: 216 10*3/uL (ref 150–400)
RBC: 4.68 MIL/uL (ref 3.87–5.11)
RDW: 13.5 % (ref 11.5–15.5)
WBC: 5.8 10*3/uL (ref 4.0–10.5)
nRBC: 0 % (ref 0.0–0.2)

## 2022-01-22 LAB — SURGICAL PCR SCREEN
MRSA, PCR: NEGATIVE
Staphylococcus aureus: NEGATIVE

## 2022-01-22 LAB — BASIC METABOLIC PANEL
Anion gap: 11 (ref 5–15)
BUN: 8 mg/dL (ref 6–20)
CO2: 21 mmol/L — ABNORMAL LOW (ref 22–32)
Calcium: 8.8 mg/dL — ABNORMAL LOW (ref 8.9–10.3)
Chloride: 107 mmol/L (ref 98–111)
Creatinine, Ser: 0.89 mg/dL (ref 0.44–1.00)
GFR, Estimated: >60 mL/min (ref >60)
Glucose, Bld: 81 mg/dL (ref 70–99)
Potassium: 5.2 mmol/L — ABNORMAL HIGH (ref 3.5–5.1)
Sodium: 139 mmol/L (ref 135–145)

## 2022-01-22 NOTE — Progress Notes (Signed)
PCP - Orson Eva, NP Cardiologist - Adrian Blackwater, MD  PPM/ICD - denies Device Orders - n/a Rep Notified - n/a  Chest x-ray - n/a EKG - 01/22/2022 Stress Test - patient not sure if she had one in 2020 or 2021. Records requested from [facility] in [place]  ECHO - 09/24/2020 Cardiac Cath - denies  Sleep Study - denies CPAP - n/a  Fasting Blood Sugar - n/a  Blood Thinner Instructions: n/a  Aspirin Instructions: Patient was instructed: As of today, STOP taking any Aspirin (unless otherwise instructed by your surgeon) Aleve, Naproxen, Ibuprofen, Motrin, Advil, Goody's, BC's, all herbal medications, fish oil, and all vitamins.  ERAS Protcol - yes PRE-SURGERY Ensure or G2- n/a  COVID TEST- done in PAT on 01/22/2022   Anesthesia review: yes - patient verbalized that she was checked for heart murmur in 2021 but she can't remember the results of her tests. Patient denies any discomfort, no symptoms.    Patient denies shortness of breath, fever, cough and chest pain at PAT appointment   All instructions explained to the patient, with a verbal understanding of the material. Patient agrees to go over the instructions while at home for a better understanding. Patient also instructed to [facility] quarantine after being tested for COVID-19. The opportunity to ask questions was provided.

## 2022-01-23 LAB — SARS CORONAVIRUS 2 (TAT 6-24 HRS): SARS Coronavirus 2: NEGATIVE

## 2022-01-23 NOTE — H&P (Signed)
Orthopaedic Trauma Service (OTS) Consult   Patient ID: Sara [name] MRN: [id number] DOB/AGE: Jul 11, 1966 56 y.o.   HPI: Sara [name] is an 56 y.o. female well-known to the orthopedic trauma service after being involved in a polytrauma back in September 2021.  Patient sustained multiple injuries including an open left distal femur fracture.  Due to significant bone loss this was treated in a staged fashion with induced membrane technique.  She was subsequently taken back for grafting of her distal femur approximately 6 weeks after formal ORIF.  Patient has been monitored since discharge.  There was a little bit of lost to follow-up but she has been following up regularly now.  She presented several months ago with continued complaints of left knee pain as well as decreased motion to her left knee.  Patient did exhaust all therapeutic interventions.  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  Patient presents today for repair of her left distal femur nonunion as well as left quadricepsplasty.  We discussed different grafting techniques with the patient.  Unfortunately due to her right femoral plate she is not a candidate for reamed intramedullary aspirate therefore we will make an intraoperative decision regarding all allograft versus iliac crest bone graft harvest  Past Medical History:  Diagnosis Date   ADHD    Depression    Dyspnea    Plantar fasciitis    bilateral feet   Thyroid disease     Past Surgical History:  Procedure Laterality Date   COLON SURGERY     I & D EXTREMITY Left 11/06/2020   Procedure: IRRIGATION AND DEBRIDEMENT EXTREMITY;  Surgeon: Myrene Galas, MD;  Location: [facility] OR;  Service: Orthopedics;  Laterality: Left;   IR CATHETER TUBE CHANGE  11/15/2020   IRRIGATION AND DEBRIDEMENT KNEE  09/22/2020   Procedure: IRRIGATION AND DEBRIDEMENT LEFT KNEE PLACEMENT OF EXTERNAL FIXATION,;  Surgeon: Yolonda Kida, MD;  Location: [facility] OR;  Service: Orthopedics;;   LAPAROTOMY N/A 09/24/2020   Procedure: EXPLORATORY LAPAROTOMY COLOSTOMY  AND REPAIR  FLANK HERNIA;  Surgeon: Berna Bue, MD;  Location: [facility] OR;  Service: General;  Laterality: N/A;   LAPAROTOMY N/A 09/22/2020   Procedure: EXPLORATORY LAPAROTOMY, Ileocecectomy;  Surgeon: Berna Bue, MD;  Location: [facility] OR;  Service: General;  Laterality: N/A;   OPEN REDUCTION INTERNAL FIXATION (ORIF) DISTAL RADIAL FRACTURE Right 09/26/2020   Procedure: OPEN REDUCTION INTERNAL FIXATION (ORIF) DISTAL RADIUS FRACTURE;  Surgeon: Myrene Galas, MD;  Location: [facility] OR;  Service: Orthopedics;  Laterality: Right;   ORIF FEMUR FRACTURE Left 11/02/2020   Procedure: INCISION DRAINAGE DEEP WOUND LEFT LEG, APPLICATION OF WOUND VAC;  Surgeon: Myrene Galas, MD;  Location: [facility] OR;  Service: Orthopedics;  Laterality: Left;   ORIF FEMUR FRACTURE Left 12/07/2020   Procedure: OPEN REDUCTION INTERNAL FIXATION (ORIF) DISTAL FEMUR FRACTURE : REPAIR OF LEFT DISTAL FEMUR NONUNION;  Surgeon: Myrene Galas, MD;  Location: [facility] OR;  Service: Orthopedics;  Laterality: Left;   ORIF TIBIA PLATEAU Bilateral 09/26/2020   Procedure: OPEN REDUCTION INTERNAL FIXATION (ORIF) RIGHT DISTAL FEMUR, RIGHT CALCANEUS, LEFT DISTAL FEMUR, LEFT TIBIAL PLATEAU FRACTURE.  IRRIGATION AND DEBRIDEMENT LEFT LEG; REMOVAL OF EXTERNAL FIXATOR LEFT LEG;  Surgeon: Myrene Galas, MD;  Location: [facility] OR;  Service: Orthopedics;  Laterality: Bilateral;    Family History  Problem Relation Age of Onset   Breast cancer Maternal Aunt     Social  History:  reports that she has quit smoking. She has quit using smokeless tobacco. She reports that she does not drink alcohol and does not use drugs.  Allergies:  Allergies  Allergen Reactions   Azithromycin Diarrhea and Nausea And Vomiting   Benzalkonium Chloride Other (See Comments)    Pt unsure of allergy    Augmentin [Amoxicillin-Pot Clavulanate] Nausea Only    Ciprofloxacin Nausea Only   Neomycin Rash   Neosporin [Bacitracin-Polymyxin B] Itching and Rash   Neosporin [Neomycin-Bacitracin Zn-Polymyx] Rash   Septra [Sulfamethoxazole-Trimethoprim] Nausea Only    Medications: I have reviewed the patient's current medications. Current Meds  Medication Sig   albuterol (VENTOLIN HFA) 108 (90 Base) MCG/ACT inhaler Inhale 2 puffs into the lungs every 6 (six) hours as needed for wheezing or shortness of breath.   diclofenac Sodium (VOLTAREN) 1 % GEL APPLY 2 GRAMS TOPICALLY TWICE DAILY TO RIGHT ELBOW (Patient taking differently: Apply 1 application topically 2 (two) times daily as needed (pain).)   fluticasone (FLONASE) 50 MCG/ACT nasal spray Place 2 sprays into both nostrils daily. (Patient taking differently: Place 2 sprays into both nostrils daily as needed for allergies.)   gabapentin (NEURONTIN) 300 MG capsule TAKE 2 CAPSULES BY MOUTH AT BEDTIME   magnesium oxide (MAG-OX) 400 (241.3 Mg) MG tablet Take 2 tablets (800 mg total) by mouth 2 (two) times daily. (Patient taking differently: Take 400 mg by mouth daily.)   naproxen (NAPROSYN) 375 MG tablet Take 375 mg by mouth 2 (two) times daily as needed for moderate pain.   ondansetron (ZOFRAN) 4 MG tablet TAKE 1 TABLET BY MOUTH 3 TIMES DAILY (Patient taking differently: Take 4 mg by mouth every 8 (eight) hours as needed for nausea or vomiting.)   potassium chloride SA (KLOR-CON) 20 MEQ tablet Take 1 tablet (20 mEq total) by mouth 2 (two) times daily. (Patient taking differently: Take 20 mEq by mouth daily.)   spironolactone (ALDACTONE) 25 MG tablet Take 25 mg by mouth daily.   TRINTELLIX 20 MG TABS tablet TAKE 1 TABLET BY MOUTH ONCE DAILY     Results for orders placed or performed during the hospital encounter of 01/22/22 (from the past 48 hour(s))  SARS CORONAVIRUS 2 (TAT 6-24 HRS) Nasopharyngeal Nasopharyngeal Swab     Status: None   Collection Time: 01/22/22  3:32 PM   Specimen: Nasopharyngeal Swab   Result Value Ref Range   SARS Coronavirus 2 NEGATIVE NEGATIVE    Comment: (NOTE) SARS-CoV-2 target nucleic acids are NOT DETECTED.  The SARS-CoV-2 RNA is generally detectable in upper and lower respiratory specimens during the acute phase of infection. Negative results do not preclude SARS-CoV-2 infection, do not rule out co-infections with other pathogens, and should not be used as the sole basis for treatment or other patient management decisions. Negative results must be combined with clinical observations, patient history, and epidemiological information. The expected result is Negative.  Fact Sheet for Patients: HairSlick.nohttps://www.fda.gov/media/138098/download  Fact Sheet for Healthcare Providers: quierodirigir.comhttps://www.fda.gov/media/138095/download  This test is not yet approved or cleared by the [place] FDA and  has been authorized for detection and/or diagnosis of SARS-CoV-2 by FDA under an Emergency Use Authorization (EUA). This EUA will remain  in effect (meaning this test can be used) for the duration of the COVID-19 declaration under Se ction 564(b)(1) of the Act, 21 U.S.C. section 360bbb-3(b)(1), unless the authorization is terminated or revoked sooner.  Performed at [facility], [address] [place], [place] [address]   Basic metabolic  panel per protocol     Status: Abnormal   Collection Time: 01/22/22  3:38 PM  Result Value Ref Range   Sodium 139 135 - 145 mmol/L   Potassium 5.2 (H) 3.5 - 5.1 mmol/L   Chloride 107 98 - 111 mmol/L   CO2 21 (L) 22 - 32 mmol/L   Glucose, Bld 81 70 - 99 mg/dL    Comment: Glucose reference range applies only to samples taken after fasting for at least 8 hours.   BUN 8 6 - 20 mg/dL   Creatinine, Ser 1.610.89 0.44 - 1.00 mg/dL   Calcium 8.8 (L) 8.9 - 10.3 mg/dL   GFR, Estimated >09>60 >60>60 mL/min    Comment: (NOTE) Calculated using the CKD-EPI Creatinine Equation (2021)    Anion gap 11 5 - 15    Comment: Performed at [facility], [address] [place], [place] [address]  CBC per protocol     Status: None   Collection Time: 01/22/22  3:38 PM  Result Value Ref Range   WBC 5.8 4.0 - 10.5 K/uL   RBC 4.68 3.87 - 5.11 MIL/uL   Hemoglobin 14.2 12.0 - 15.0 g/dL   HCT 81.145.5 91.436.0 - 78.246.0 %   MCV 97.2 80.0 - 100.0 fL   MCH 30.3 26.0 - 34.0 pg   MCHC 31.2 30.0 - 36.0 g/dL   RDW 95.613.5 21.311.5 - 08.615.5 %   Platelets 216 150 - 400 K/uL   nRBC 0.0 0.0 - 0.2 %    Comment: Performed at [facility], [address] [place], [place] [address]  Surgical PCR Screen     Status: None   Collection Time: 01/22/22  3:38 PM   Specimen: Nasal Mucosa; Nasal Swab  Result Value Ref Range   MRSA, PCR NEGATIVE NEGATIVE   Staphylococcus aureus NEGATIVE NEGATIVE    Comment: (NOTE) The Xpert SA Assay (FDA approved for NASAL specimens in patients 54 years of age and older), is one component of a comprehensive surveillance program. It is not intended to diagnose infection nor to guide or monitor treatment. Performed at [facility], [address] [place], [place] [address]     No results found.  Intake/Output    None      Review of Systems  Constitutional:  Negative for chills and fever.  Respiratory:  Negative for shortness of breath.   Cardiovascular:  Negative for chest pain and palpitations.  Gastrointestinal:  Negative for abdominal pain, nausea and vomiting.  Genitourinary:  Negative for dysuria.  Musculoskeletal:        As above  There were no vitals taken for this visit. Physical Exam Vitals reviewed.  Constitutional:      General: She is awake. She is not in acute distress.    Comments: Pleasant, NAD  HENT:     Head: Normocephalic and atraumatic.  Eyes:     Extraocular Movements: Extraocular movements intact.  Cardiovascular:     Heart sounds: S1 normal and S2 normal.  Pulmonary:     Effort: Pulmonary effort is normal.  Musculoskeletal:     Comments: Left Lower Extremity  Mild discomfort at  her nonunion site.  But no gross instability Range of motion is 0 to 65 degrees. Distal motor and sensory function intact No pitting edema or asymmetric swelling No deep calf tenderness All surgical wounds are well-healed No pain with axial loading or logrolling of her hip No pain distally to her lower leg ankle or  foot Knee is grossly stable with varus valgus stressing at full extension and 30 degrees of flexion  Skin:    Capillary Refill: Capillary refill takes less than 2 seconds.  Neurological:     General: No focal deficit present.     Mental Status: She is alert and oriented to person, place, and time.  Psychiatric:        Attention and Perception: Attention normal.        Mood and Affect: Mood normal.        Behavior: Behavior is cooperative.    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  There is also significant bony overgrowth of the plate adding to complexity of the case   Will likely proceed with debridement of the nonunion site.  Depending on size of the defect postdebridement that we will determine whether or not to proceed with all allograft versus iliac crest bone graft   Anticipate overnight stay for pain control and therapy   Will allow partial weightbearing postoperatively approximately 50% of body weight  Aggressive range of motion postoperatively   We did discuss that there is a possibility we may not be able to restore full motion either with quadricepsplasty and manipulation   - Pain management:  Multimodal, titrate postoperatively  - ID:   Hold preoperative antibiotics as we do plan for intraoperative cultures given that  this was an open fracture  - Metabolic Bone Disease:  Check basic labs  - Dispo:  OR for repair of left distal femur nonunion and left knee contracture   Mearl Latin, PA-C [phone] (C) 01/23/2022, 4:57 PM  [facility] [address] [place] [address] [phone] [phone] (F)    After 5pm and on the weekends please log on to Amion, go to orthopaedics and the look under the [facility] Call for the provider(s) on call. You can also call our office at [phone] and then follow the prompts to be connected to the call team.

## 2022-01-24 ENCOUNTER — Other Ambulatory Visit: Payer: Self-pay

## 2022-01-24 ENCOUNTER — Inpatient Hospital Stay (HOSPITAL_COMMUNITY): Payer: Medicaid Other

## 2022-01-24 ENCOUNTER — Inpatient Hospital Stay (HOSPITAL_COMMUNITY): Payer: Medicaid Other | Admitting: Physician Assistant

## 2022-01-24 ENCOUNTER — Ambulatory Visit: Payer: Medicaid Other

## 2022-01-24 ENCOUNTER — Inpatient Hospital Stay (HOSPITAL_COMMUNITY)
Admission: RE | Admit: 2022-01-24 | Discharge: 2022-01-25 | DRG: 481 | Disposition: A | Discharge status: Home / Self Care | Payer: Medicaid Other | Attending: Orthopedic Surgery | Admitting: Orthopedic Surgery

## 2022-01-24 ENCOUNTER — Encounter (HOSPITAL_COMMUNITY): Payer: Self-pay | Admitting: Orthopedic Surgery

## 2022-01-24 ENCOUNTER — Encounter (HOSPITAL_COMMUNITY)
Admission: RE | Disposition: A | Discharge status: Home / Self Care | Payer: Self-pay | Source: Home / Self Care | Attending: Orthopedic Surgery

## 2022-01-24 DIAGNOSIS — Z20822 Contact with and (suspected) exposure to covid-19: Secondary | ICD-10-CM | POA: Diagnosis present

## 2022-01-24 DIAGNOSIS — Z888 Allergy status to other drugs, medicaments and biological substances status: Secondary | ICD-10-CM

## 2022-01-24 DIAGNOSIS — D62 Acute posthemorrhagic anemia: Secondary | ICD-10-CM | POA: Diagnosis not present

## 2022-01-24 DIAGNOSIS — Z803 Family history of malignant neoplasm of breast: Secondary | ICD-10-CM

## 2022-01-24 DIAGNOSIS — Z9109 Other allergy status, other than to drugs and biological substances: Secondary | ICD-10-CM

## 2022-01-24 DIAGNOSIS — S72402D Unspecified fracture of lower end of left femur, subsequent encounter for closed fracture with routine healing: Secondary | ICD-10-CM | POA: Diagnosis not present

## 2022-01-24 DIAGNOSIS — Z87891 Personal history of nicotine dependence: Secondary | ICD-10-CM

## 2022-01-24 DIAGNOSIS — S72402K Unspecified fracture of lower end of left femur, subsequent encounter for closed fracture with nonunion: Secondary | ICD-10-CM | POA: Diagnosis not present

## 2022-01-24 DIAGNOSIS — S72452N Displaced supracondylar fracture without intracondylar extension of lower end of left femur, subsequent encounter for open fracture type IIIA, IIIB, or IIIC with nonunion: Secondary | ICD-10-CM | POA: Diagnosis not present

## 2022-01-24 DIAGNOSIS — Z01818 Encounter for other preprocedural examination: Secondary | ICD-10-CM

## 2022-01-24 DIAGNOSIS — R918 Other nonspecific abnormal finding of lung field: Secondary | ICD-10-CM | POA: Diagnosis not present

## 2022-01-24 DIAGNOSIS — S72492C Other fracture of lower end of left femur, initial encounter for open fracture type IIIA, IIIB, or IIIC: Principal | ICD-10-CM

## 2022-01-24 DIAGNOSIS — Z882 Allergy status to sulfonamides status: Secondary | ICD-10-CM | POA: Diagnosis not present

## 2022-01-24 DIAGNOSIS — F909 Attention-deficit hyperactivity disorder, unspecified type: Secondary | ICD-10-CM | POA: Diagnosis not present

## 2022-01-24 DIAGNOSIS — Z419 Encounter for procedure for purposes other than remedying health state, unspecified: Secondary | ICD-10-CM

## 2022-01-24 DIAGNOSIS — F32A Depression, unspecified: Secondary | ICD-10-CM | POA: Diagnosis present

## 2022-01-24 DIAGNOSIS — S72492D Other fracture of lower end of left femur, subsequent encounter for closed fracture with routine healing: Secondary | ICD-10-CM | POA: Diagnosis not present

## 2022-01-24 DIAGNOSIS — M24562 Contracture, left knee: Secondary | ICD-10-CM | POA: Diagnosis not present

## 2022-01-24 DIAGNOSIS — Z881 Allergy status to other antibiotic agents status: Secondary | ICD-10-CM

## 2022-01-24 DIAGNOSIS — S72462N Displaced supracondylar fracture with intracondylar extension of lower end of left femur, subsequent encounter for open fracture type IIIA, IIIB, or IIIC with nonunion: Principal | ICD-10-CM | POA: Diagnosis present

## 2022-01-24 HISTORY — PX: ORIF FEMUR FRACTURE: SHX2119

## 2022-01-24 LAB — CBC WITH DIFFERENTIAL/PLATELET
Abs Immature Granulocytes: 0.01 10*3/uL (ref 0.00–0.07)
Basophils Absolute: 0.1 10*3/uL (ref 0.0–0.1)
Basophils Relative: 1 %
Eosinophils Absolute: 0.3 10*3/uL (ref 0.0–0.5)
Eosinophils Relative: 4 %
HCT: 45 % (ref 36.0–46.0)
Hemoglobin: 13.9 g/dL (ref 12.0–15.0)
Immature Granulocytes: 0 %
Lymphocytes Relative: 27 %
Lymphs Abs: 1.9 10*3/uL (ref 0.7–4.0)
MCH: 30 pg (ref 26.0–34.0)
MCHC: 30.9 g/dL (ref 30.0–36.0)
MCV: 97 fL (ref 80.0–100.0)
Monocytes Absolute: 0.6 10*3/uL (ref 0.1–1.0)
Monocytes Relative: 8 %
Neutro Abs: 4.3 10*3/uL (ref 1.7–7.7)
Neutrophils Relative %: 60 %
Platelets: 204 10*3/uL (ref 150–400)
RBC: 4.64 MIL/uL (ref 3.87–5.11)
RDW: 13.5 % (ref 11.5–15.5)
WBC: 7 10*3/uL (ref 4.0–10.5)
nRBC: 0 % (ref 0.0–0.2)

## 2022-01-24 LAB — COMPREHENSIVE METABOLIC PANEL
ALT: 33 U/L (ref 0–44)
AST: 27 U/L (ref 15–41)
Albumin: 3.4 g/dL — ABNORMAL LOW (ref 3.5–5.0)
Alkaline Phosphatase: 88 U/L (ref 38–126)
Anion gap: 8 (ref 5–15)
BUN: 14 mg/dL (ref 6–20)
CO2: 25 mmol/L (ref 22–32)
Calcium: 8.9 mg/dL (ref 8.9–10.3)
Chloride: 107 mmol/L (ref 98–111)
Creatinine, Ser: 0.98 mg/dL (ref 0.44–1.00)
GFR, Estimated: >60 mL/min (ref >60)
Glucose, Bld: 92 mg/dL (ref 70–99)
Potassium: 3.8 mmol/L (ref 3.5–5.1)
Sodium: 140 mmol/L (ref 135–145)
Total Bilirubin: 0.4 mg/dL (ref 0.3–1.2)
Total Protein: 6.3 g/dL — ABNORMAL LOW (ref 6.5–8.1)

## 2022-01-24 LAB — POCT I-STAT, CHEM 8
BUN: 13 mg/dL (ref 6–20)
Calcium, Ion: 1.17 mmol/L (ref 1.15–1.40)
Chloride: 105 mmol/L (ref 98–111)
Creatinine, Ser: 0.9 mg/dL (ref 0.44–1.00)
Glucose, Bld: 112 mg/dL — ABNORMAL HIGH (ref 70–99)
HCT: 34 % — ABNORMAL LOW (ref 36.0–46.0)
Hemoglobin: 11.6 g/dL — ABNORMAL LOW (ref 12.0–15.0)
Potassium: 3.3 mmol/L — ABNORMAL LOW (ref 3.5–5.1)
Sodium: 140 mmol/L (ref 135–145)
TCO2: 21 mmol/L — ABNORMAL LOW (ref 22–32)

## 2022-01-24 LAB — CBC
HCT: 37.1 % (ref 36.0–46.0)
Hemoglobin: 11.5 g/dL — ABNORMAL LOW (ref 12.0–15.0)
MCH: 29.9 pg (ref 26.0–34.0)
MCHC: 31 g/dL (ref 30.0–36.0)
MCV: 96.6 fL (ref 80.0–100.0)
Platelets: 190 10*3/uL (ref 150–400)
RBC: 3.84 MIL/uL — ABNORMAL LOW (ref 3.87–5.11)
RDW: 13.4 % (ref 11.5–15.5)
WBC: 9.5 10*3/uL (ref 4.0–10.5)
nRBC: 0 % (ref 0.0–0.2)

## 2022-01-24 LAB — SEDIMENTATION RATE: Sed Rate: 35 mm/hr — ABNORMAL HIGH (ref 0–22)

## 2022-01-24 LAB — VITAMIN D 25 HYDROXY (VIT D DEFICIENCY, FRACTURES): Vit D, 25-Hydroxy: 33.16 ng/mL (ref 30–100)

## 2022-01-24 LAB — PROTIME-INR
INR: 1 (ref 0.8–1.2)
Prothrombin Time: 12.8 seconds (ref 11.4–15.2)

## 2022-01-24 LAB — C-REACTIVE PROTEIN: CRP: 0.9 mg/dL (ref <1.0)

## 2022-01-24 SURGERY — OPEN REDUCTION INTERNAL FIXATION (ORIF) DISTAL FEMUR FRACTURE
Anesthesia: General | Laterality: Left

## 2022-01-24 MED ORDER — LIDOCAINE 2% (20 MG/ML) 5 ML SYRINGE
INTRAMUSCULAR | Status: AC
  Filled 2022-01-24: qty 5

## 2022-01-24 MED ORDER — EPHEDRINE 5 MG/ML INJ
INTRAVENOUS | Status: AC
  Filled 2022-01-24: qty 5

## 2022-01-24 MED ORDER — DIPHENHYDRAMINE HCL 50 MG/ML IJ SOLN
INTRAMUSCULAR | Status: AC
  Filled 2022-01-24: qty 1

## 2022-01-24 MED ORDER — ALBUTEROL SULFATE HFA 108 (90 BASE) MCG/ACT IN AERS: 2.0000 | INHALATION_SPRAY | Freq: Four times a day (QID) | RESPIRATORY_TRACT | Status: DC | PRN

## 2022-01-24 MED ORDER — MORPHINE SULFATE (PF) 2 MG/ML IV SOLN
0.5000 mg | INTRAVENOUS | Status: DC | PRN
  Administered 2022-01-25: 1 mg via INTRAVENOUS
  Filled 2022-01-24: qty 1

## 2022-01-24 MED ORDER — ACETAMINOPHEN 500 MG PO TABS: 500.0000 mg | ORAL_TABLET | Freq: Four times a day (QID) | ORAL | Status: DC | PRN

## 2022-01-24 MED ORDER — ONDANSETRON HCL 4 MG/2ML IJ SOLN
INTRAMUSCULAR | Status: DC | PRN
Start: 2022-01-24 — End: 2022-01-24
  Administered 2022-01-24: 4 mg via INTRAVENOUS

## 2022-01-24 MED ORDER — EPHEDRINE SULFATE-NACL 50-0.9 MG/10ML-% IV SOSY
PREFILLED_SYRINGE | INTRAVENOUS | Status: DC | PRN
  Administered 2022-01-24 (×2): 10 mg via INTRAVENOUS

## 2022-01-24 MED ORDER — FENTANYL CITRATE (PF) 100 MCG/2ML IJ SOLN
INTRAMUSCULAR | Status: AC
  Filled 2022-01-24: qty 2

## 2022-01-24 MED ORDER — METHOCARBAMOL 500 MG PO TABS
500.0000 mg | ORAL_TABLET | Freq: Four times a day (QID) | ORAL | Status: DC | PRN
  Filled 2022-01-24: qty 1

## 2022-01-24 MED ORDER — HYDROCODONE-ACETAMINOPHEN 7.5-325 MG PO TABS: 1.0000 | ORAL_TABLET | Freq: Four times a day (QID) | ORAL | Status: DC | PRN

## 2022-01-24 MED ORDER — MIDAZOLAM HCL 5 MG/5ML IJ SOLN
INTRAMUSCULAR | Status: DC | PRN
  Administered 2022-01-24: 2 mg via INTRAVENOUS

## 2022-01-24 MED ORDER — ONDANSETRON HCL 4 MG/2ML IJ SOLN
4.0000 mg | Freq: Four times a day (QID) | INTRAMUSCULAR | Status: DC | PRN
  Administered 2022-01-25: 4 mg via INTRAVENOUS
  Filled 2022-01-24: qty 2

## 2022-01-24 MED ORDER — HYDROCODONE-ACETAMINOPHEN 5-325 MG PO TABS
1.0000 | ORAL_TABLET | Freq: Four times a day (QID) | ORAL | Status: DC | PRN
  Administered 2022-01-24 – 2022-01-25 (×3): 2 via ORAL
  Filled 2022-01-24 (×3): qty 2

## 2022-01-24 MED ORDER — FENTANYL CITRATE (PF) 250 MCG/5ML IJ SOLN
INTRAMUSCULAR | Status: DC | PRN
  Administered 2022-01-24 (×3): 100 ug via INTRAVENOUS
  Administered 2022-01-24: 50 ug via INTRAVENOUS

## 2022-01-24 MED ORDER — METOCLOPRAMIDE HCL 5 MG/ML IJ SOLN: 5.0000 mg | Freq: Three times a day (TID) | INTRAMUSCULAR | Status: DC | PRN

## 2022-01-24 MED ORDER — VORTIOXETINE HBR 20 MG PO TABS
20.0000 mg | ORAL_TABLET | Freq: Every day | ORAL | Status: DC
  Administered 2022-01-25: 20 mg via ORAL
  Filled 2022-01-24: qty 1

## 2022-01-24 MED ORDER — DOCUSATE SODIUM 100 MG PO CAPS
100.0000 mg | ORAL_CAPSULE | Freq: Two times a day (BID) | ORAL | Status: DC
  Filled 2022-01-24: qty 1

## 2022-01-24 MED ORDER — DEXAMETHASONE SODIUM PHOSPHATE 10 MG/ML IJ SOLN
INTRAMUSCULAR | Status: DC | PRN
Start: 2022-01-24 — End: 2022-01-24
  Administered 2022-01-24: 4 mg via INTRAVENOUS

## 2022-01-24 MED ORDER — ROCURONIUM BROMIDE 10 MG/ML (PF) SYRINGE
PREFILLED_SYRINGE | INTRAVENOUS | Status: DC | PRN
  Administered 2022-01-24: 60 mg via INTRAVENOUS
  Administered 2022-01-24: 40 mg via INTRAVENOUS

## 2022-01-24 MED ORDER — DEXAMETHASONE SODIUM PHOSPHATE 10 MG/ML IJ SOLN
INTRAMUSCULAR | Status: AC
  Filled 2022-01-24: qty 1

## 2022-01-24 MED ORDER — CHLORHEXIDINE GLUCONATE 0.12 % MT SOLN
15.0000 mL | Freq: Once | OROMUCOSAL | Status: AC
  Administered 2022-01-24: 15 mL via OROMUCOSAL
  Filled 2022-01-24: qty 15

## 2022-01-24 MED ORDER — OXYCODONE HCL 5 MG PO TABS: 5.0000 mg | ORAL_TABLET | Freq: Once | ORAL | Status: DC | PRN

## 2022-01-24 MED ORDER — ONDANSETRON HCL 4 MG/2ML IJ SOLN: 4.0000 mg | Freq: Four times a day (QID) | INTRAMUSCULAR | Status: DC | PRN

## 2022-01-24 MED ORDER — LACTATED RINGERS IV SOLN: INTRAVENOUS | Status: DC

## 2022-01-24 MED ORDER — FENTANYL CITRATE (PF) 100 MCG/2ML IJ SOLN
25.0000 ug | INTRAMUSCULAR | Status: DC | PRN
  Administered 2022-01-24 (×3): 50 ug via INTRAVENOUS

## 2022-01-24 MED ORDER — POTASSIUM CHLORIDE IN NACL 20-0.9 MEQ/L-% IV SOLN
INTRAVENOUS | Status: DC
  Filled 2022-01-24: qty 1000

## 2022-01-24 MED ORDER — GABAPENTIN 300 MG PO CAPS: 600.0000 mg | ORAL_CAPSULE | Freq: Every day | ORAL | Status: DC

## 2022-01-24 MED ORDER — DIPHENHYDRAMINE HCL 50 MG/ML IJ SOLN
INTRAMUSCULAR | Status: DC | PRN
  Administered 2022-01-24: 12.5 mg via INTRAVENOUS

## 2022-01-24 MED ORDER — PIPERACILLIN-TAZOBACTAM 3.375 G IVPB 30 MIN
3.3750 g | INTRAVENOUS | Status: AC
  Administered 2022-01-24: 3.375 g via INTRAVENOUS
  Filled 2022-01-24: qty 50

## 2022-01-24 MED ORDER — FENTANYL CITRATE (PF) 250 MCG/5ML IJ SOLN
INTRAMUSCULAR | Status: AC
  Filled 2022-01-24: qty 5

## 2022-01-24 MED ORDER — ACETAMINOPHEN 10 MG/ML IV SOLN
INTRAVENOUS | Status: DC | PRN
  Administered 2022-01-24: 1000 mg via INTRAVENOUS

## 2022-01-24 MED ORDER — METHOCARBAMOL 1000 MG/10ML IJ SOLN
500.0000 mg | Freq: Four times a day (QID) | INTRAVENOUS | Status: DC | PRN
  Filled 2022-01-24: qty 5

## 2022-01-24 MED ORDER — KETAMINE HCL 10 MG/ML IJ SOLN
INTRAMUSCULAR | Status: DC | PRN
  Administered 2022-01-24 (×2): 10 mg via INTRAVENOUS
  Administered 2022-01-24: 30 mg via INTRAVENOUS

## 2022-01-24 MED ORDER — MIDAZOLAM HCL 2 MG/2ML IJ SOLN
INTRAMUSCULAR | Status: AC
  Filled 2022-01-24: qty 2

## 2022-01-24 MED ORDER — OXYCODONE HCL 5 MG/5ML PO SOLN: 5.0000 mg | Freq: Once | ORAL | Status: DC | PRN

## 2022-01-24 MED ORDER — ALBUMIN HUMAN 5 % IV SOLN
12.5000 g | Freq: Once | INTRAVENOUS | Status: AC
  Administered 2022-01-24: 12.5 g via INTRAVENOUS

## 2022-01-24 MED ORDER — ALBUMIN HUMAN 5 % IV SOLN
INTRAVENOUS | Status: AC
  Filled 2022-01-24: qty 250

## 2022-01-24 MED ORDER — MAGNESIUM OXIDE -MG SUPPLEMENT 400 (240 MG) MG PO TABS
400.0000 mg | ORAL_TABLET | Freq: Every day | ORAL | Status: DC
  Administered 2022-01-24: 400 mg via ORAL
  Filled 2022-01-24 (×2): qty 1

## 2022-01-24 MED ORDER — ALBUTEROL SULFATE (2.5 MG/3ML) 0.083% IN NEBU: 2.5000 mg | INHALATION_SOLUTION | Freq: Four times a day (QID) | RESPIRATORY_TRACT | Status: DC | PRN

## 2022-01-24 MED ORDER — ALBUMIN HUMAN 5 % IV SOLN: INTRAVENOUS | Status: DC | PRN

## 2022-01-24 MED ORDER — PROPOFOL 10 MG/ML IV BOLUS
INTRAVENOUS | Status: AC
  Filled 2022-01-24: qty 20

## 2022-01-24 MED ORDER — VANCOMYCIN HCL 1000 MG IV SOLR
INTRAVENOUS | Status: DC | PRN
  Administered 2022-01-24: 1000 mg via TOPICAL

## 2022-01-24 MED ORDER — PROPOFOL 10 MG/ML IV BOLUS
INTRAVENOUS | Status: DC | PRN
  Administered 2022-01-24: 120 mg via INTRAVENOUS

## 2022-01-24 MED ORDER — VANCOMYCIN HCL 1000 MG IV SOLR
INTRAVENOUS | Status: AC
  Filled 2022-01-24: qty 20

## 2022-01-24 MED ORDER — SUGAMMADEX SODIUM 200 MG/2ML IV SOLN
INTRAVENOUS | Status: DC | PRN
  Administered 2022-01-24: 200 mg via INTRAVENOUS

## 2022-01-24 MED ORDER — ONDANSETRON HCL 4 MG PO TABS: 4.0000 mg | ORAL_TABLET | Freq: Four times a day (QID) | ORAL | Status: DC | PRN

## 2022-01-24 MED ORDER — 0.9 % SODIUM CHLORIDE (POUR BTL) OPTIME
TOPICAL | Status: DC | PRN
  Administered 2022-01-24: 1000 mL

## 2022-01-24 MED ORDER — PHENYLEPHRINE HCL-NACL 20-0.9 MG/250ML-% IV SOLN
INTRAVENOUS | Status: DC | PRN
  Administered 2022-01-24: 25 ug/min via INTRAVENOUS

## 2022-01-24 MED ORDER — CEFAZOLIN SODIUM-DEXTROSE 1-4 GM/50ML-% IV SOLN
1.0000 g | Freq: Four times a day (QID) | INTRAVENOUS | Status: AC
  Administered 2022-01-24 – 2022-01-25 (×3): 1 g via INTRAVENOUS
  Filled 2022-01-24 (×3): qty 50

## 2022-01-24 MED ORDER — ACETAMINOPHEN 10 MG/ML IV SOLN
INTRAVENOUS | Status: AC
  Filled 2022-01-24: qty 100

## 2022-01-24 MED ORDER — KETAMINE HCL 50 MG/5ML IJ SOSY
PREFILLED_SYRINGE | INTRAMUSCULAR | Status: AC
  Filled 2022-01-24: qty 5

## 2022-01-24 MED ORDER — ONDANSETRON HCL 4 MG/2ML IJ SOLN
INTRAMUSCULAR | Status: AC
  Filled 2022-01-24: qty 2

## 2022-01-24 MED ORDER — ORAL CARE MOUTH RINSE: 15.0000 mL | Freq: Once | OROMUCOSAL | Status: AC

## 2022-01-24 MED ORDER — POTASSIUM CHLORIDE CRYS ER 20 MEQ PO TBCR
20.0000 meq | EXTENDED_RELEASE_TABLET | Freq: Every day | ORAL | Status: DC
  Administered 2022-01-24 – 2022-01-25 (×2): 20 meq via ORAL
  Filled 2022-01-24 (×2): qty 1

## 2022-01-24 MED ORDER — LIDOCAINE 2% (20 MG/ML) 5 ML SYRINGE
INTRAMUSCULAR | Status: DC | PRN
Start: 2022-01-24 — End: 2022-01-24
  Administered 2022-01-24: 60 mg via INTRAVENOUS

## 2022-01-24 MED ORDER — ROCURONIUM BROMIDE 10 MG/ML (PF) SYRINGE
PREFILLED_SYRINGE | INTRAVENOUS | Status: AC
  Filled 2022-01-24: qty 10

## 2022-01-24 MED ORDER — ACETAMINOPHEN 325 MG PO TABS: 325.0000 mg | ORAL_TABLET | Freq: Four times a day (QID) | ORAL | Status: DC | PRN

## 2022-01-24 MED ORDER — METOCLOPRAMIDE HCL 5 MG PO TABS: 5.0000 mg | ORAL_TABLET | Freq: Three times a day (TID) | ORAL | Status: DC | PRN

## 2022-01-24 SURGICAL SUPPLY — 67 items
BAG COUNTER SPONGE SURGICOUNT (BAG) ×2 IMPLANT
BETADINE 5% OPHTHALMIC (OPHTHALMIC) IMPLANT
BLADE CLIPPER SURG (BLADE) IMPLANT
BNDG ELASTIC 4X5.8 VLCR STR LF (GAUZE/BANDAGES/DRESSINGS) ×2 IMPLANT
BNDG ELASTIC 6X5.8 VLCR STR LF (GAUZE/BANDAGES/DRESSINGS) ×2 IMPLANT
BNDG GAUZE ELAST 4 BULKY (GAUZE/BANDAGES/DRESSINGS) ×2 IMPLANT
BONE CANC CHIPS 40CC CAN1/2 (Bone Implant) ×2 IMPLANT
BRUSH SCRUB EZ PLAIN DRY (MISCELLANEOUS) ×4 IMPLANT
CANISTER SUCT 3000ML PPV (MISCELLANEOUS) ×2 IMPLANT
CHIPS CANC BONE 40CC CAN1/2 (Bone Implant) ×1 IMPLANT
COVER SURGICAL LIGHT HANDLE (MISCELLANEOUS) ×2 IMPLANT
DRAPE C-ARM 42X72 X-RAY (DRAPES) ×2 IMPLANT
DRAPE C-ARMOR (DRAPES) ×2 IMPLANT
DRAPE IMP U-DRAPE 54X76 (DRAPES) ×2 IMPLANT
DRAPE ORTHO SPLIT 77X108 STRL (DRAPES) ×3
DRAPE SURG ORHT 6 SPLT 77X108 (DRAPES) ×3 IMPLANT
DRAPE U-SHAPE 47X51 STRL (DRAPES) ×2 IMPLANT
DRSG ADAPTIC 3X8 NADH LF (GAUZE/BANDAGES/DRESSINGS) ×2 IMPLANT
DRSG MEPILEX BORDER 4X8 (GAUZE/BANDAGES/DRESSINGS) ×1 IMPLANT
DRSG PAD ABDOMINAL 8X10 ST (GAUZE/BANDAGES/DRESSINGS) ×8 IMPLANT
ELECT REM PT RETURN 9FT ADLT (ELECTROSURGICAL) ×2
ELECTRODE REM PT RTRN 9FT ADLT (ELECTROSURGICAL) ×1 IMPLANT
EVACUATOR 1/8 PVC DRAIN (DRAIN) IMPLANT
EVACUATOR 3/16  PVC DRAIN (DRAIN)
EVACUATOR 3/16 PVC DRAIN (DRAIN) IMPLANT
GAUZE SPONGE 4X4 12PLY STRL (GAUZE/BANDAGES/DRESSINGS) ×2 IMPLANT
GLOVE SRG 8 PF TXTR STRL LF DI (GLOVE) ×1 IMPLANT
GLOVE SURG ENC MOIS LTX SZ8 (GLOVE) ×3 IMPLANT
GLOVE SURG ORTHO LTX SZ7.5 (GLOVE) ×4 IMPLANT
GLOVE SURG UNDER POLY LF SZ7.5 (GLOVE) ×3 IMPLANT
GLOVE SURG UNDER POLY LF SZ8 (GLOVE) ×2
GOWN STRL REUS W/ TWL LRG LVL3 (GOWN DISPOSABLE) ×2 IMPLANT
GOWN STRL REUS W/ TWL XL LVL3 (GOWN DISPOSABLE) ×1 IMPLANT
GOWN STRL REUS W/TWL LRG LVL3 (GOWN DISPOSABLE) ×2
GOWN STRL REUS W/TWL XL LVL3 (GOWN DISPOSABLE) ×2
GRAFT BNE CHIP CANC 1-8 40 (Bone Implant) IMPLANT
KIT BASIN OR (CUSTOM PROCEDURE TRAY) ×2 IMPLANT
KIT INFUSE LRG II (Orthopedic Implant) ×1 IMPLANT
KIT TURNOVER KIT B (KITS) ×2 IMPLANT
NEEDLE 22X1 1/2 (OR ONLY) (NEEDLE) IMPLANT
NS IRRIG 1000ML POUR BTL (IV SOLUTION) ×2 IMPLANT
OPHTHALMIC BETADINE 5% (OPHTHALMIC) ×1
PACK TOTAL JOINT (CUSTOM PROCEDURE TRAY) ×2 IMPLANT
PACK UNIVERSAL I (CUSTOM PROCEDURE TRAY) ×2 IMPLANT
PAD ARMBOARD 7.5X6 YLW CONV (MISCELLANEOUS) ×4 IMPLANT
PAD CAST 4YDX4 CTTN HI CHSV (CAST SUPPLIES) ×1 IMPLANT
PADDING CAST COTTON 4X4 STRL (CAST SUPPLIES) ×1
PADDING CAST COTTON 6X4 STRL (CAST SUPPLIES) ×2 IMPLANT
SPONGE T-LAP 18X18 ~~LOC~~+RFID (SPONGE) ×1 IMPLANT
STAPLER VISISTAT 35W (STAPLE) ×2 IMPLANT
SUCTION FRAZIER HANDLE 10FR (MISCELLANEOUS) ×1
SUCTION TUBE FRAZIER 10FR DISP (MISCELLANEOUS) ×1 IMPLANT
SUT ETHILON 2 0 PSLX (SUTURE) ×2 IMPLANT
SUT PDS AB 1 CT  36 (SUTURE) ×1
SUT PDS AB 1 CT 36 (SUTURE) IMPLANT
SUT PROLENE 0 CT 2 (SUTURE) ×1 IMPLANT
SUT VIC AB 0 CT1 27 (SUTURE) ×2
SUT VIC AB 0 CT1 27XBRD ANBCTR (SUTURE) ×2 IMPLANT
SUT VIC AB 1 CT1 27 (SUTURE) ×1
SUT VIC AB 1 CT1 27XBRD ANBCTR (SUTURE) ×2 IMPLANT
SUT VIC AB 2-0 CT1 27 (SUTURE)
SUT VIC AB 2-0 CT1 TAPERPNT 27 (SUTURE) ×2 IMPLANT
SYR 20ML ECCENTRIC (SYRINGE) IMPLANT
TOWEL GREEN STERILE (TOWEL DISPOSABLE) ×4 IMPLANT
TOWEL GREEN STERILE FF (TOWEL DISPOSABLE) ×2 IMPLANT
TRAY FOLEY MTR SLVR 16FR STAT (SET/KITS/TRAYS/PACK) ×1 IMPLANT
WATER STERILE IRR 1000ML POUR (IV SOLUTION) ×4 IMPLANT

## 2022-01-24 NOTE — Anesthesia Procedure Notes (Signed)
Procedure Name: Intubation Date/Time: 01/24/2022 8:28 AM Performed by: Renato Shin, CRNA Pre-anesthesia Checklist: Patient identified, Emergency Drugs available, Suction available and Patient being monitored Patient Re-evaluated:Patient Re-evaluated prior to induction Oxygen Delivery Method: Circle system utilized Preoxygenation: Pre-oxygenation with 100% oxygen Induction Type: IV induction Ventilation: Mask ventilation without difficulty Laryngoscope Size: Miller and 3 Grade View: Grade I Tube type: Oral Number of attempts: 1 Airway Equipment and Method: Stylet and Oral airway Placement Confirmation: ETT inserted through vocal cords under direct vision, positive ETCO2 and breath sounds checked- equal and bilateral Secured at: 21 cm Tube secured with: Tape Dental Injury: Teeth and Oropharynx as per pre-operative assessment

## 2022-01-24 NOTE — Progress Notes (Signed)
Orthopedic Tech Progress Note Patient Details:  Sara [name] [birth year] [id number]  CPM Left Knee CPM Left Knee: On  Post Interventions Patient Tolerated: Well  Al Decant 01/24/2022, 12:56 PM

## 2022-01-24 NOTE — Plan of Care (Signed)

## 2022-01-24 NOTE — Anesthesia Preprocedure Evaluation (Signed)
Anesthesia Evaluation  Patient identified by MRN, date of birth, ID band Patient awake    Reviewed: Allergy & Precautions, H&P , NPO status , Patient's Chart, lab work & pertinent test results  Airway Mallampati: II   Neck ROM: full    Dental   Pulmonary shortness of breath, Patient abstained from smoking., former smoker,    breath sounds clear to auscultation       Cardiovascular negative cardio ROS   Rhythm:regular Rate:Normal     Neuro/Psych PSYCHIATRIC DISORDERS Depression  Neuromuscular disease    GI/Hepatic   Endo/Other    Renal/GU      Musculoskeletal   Abdominal   Peds  Hematology   Anesthesia Other Findings   Reproductive/Obstetrics                             Anesthesia Physical Anesthesia Plan  ASA: 2  Anesthesia Plan: General   Post-op Pain Management:    Induction: Intravenous  PONV Risk Score and Plan: 3 and Ondansetron, Dexamethasone, Midazolam and Treatment may vary due to age or medical condition  Airway Management Planned: Oral ETT  Additional Equipment:   Intra-op Plan:   Post-operative Plan: Extubation in OR  Informed Consent: I have reviewed the patients History and Physical, chart, labs and discussed the procedure including the risks, benefits and alternatives for the proposed anesthesia with the patient or authorized representative who has indicated his/her understanding and acceptance.     Dental advisory given  Plan Discussed with: CRNA, Anesthesiologist and Surgeon  Anesthesia Plan Comments:         Anesthesia Quick Evaluation

## 2022-01-24 NOTE — Op Note (Signed)
[id number] °

## 2022-01-24 NOTE — Transfer of Care (Signed)
Immediate Anesthesia Transfer of Care Note  Patient: Sara [name]  Procedure(s) Performed: REPAIR LEFT FEMUR NONUNION, QUADRECEPLASTY (Left)  Patient Location: PACU  Anesthesia Type:General  Level of Consciousness: awake and patient cooperative  Airway & Oxygen Therapy: Patient Spontanous Breathing and Patient connected to face mask oxygen  Post-op Assessment: Report given to RN and Post -op Vital signs reviewed and stable  Post vital signs: Reviewed and stable  Last Vitals:  Vitals Value Taken Time  BP 107/51 01/24/22 1059  Temp    Pulse 78 01/24/22 1101  Resp 13 01/24/22 1101  SpO2 100 % 01/24/22 1101  Vitals shown include unvalidated device data.  Last Pain:  Vitals:   01/24/22 0708  TempSrc:   PainSc: 0-No pain         Complications: No notable events documented.

## 2022-01-25 ENCOUNTER — Encounter (HOSPITAL_COMMUNITY): Payer: Self-pay | Admitting: Orthopedic Surgery

## 2022-01-25 ENCOUNTER — Other Ambulatory Visit (HOSPITAL_COMMUNITY): Payer: Self-pay

## 2022-01-25 DIAGNOSIS — M24562 Contracture, left knee: Secondary | ICD-10-CM | POA: Diagnosis present

## 2022-01-25 HISTORY — DX: Contracture, left knee: M24.562

## 2022-01-25 LAB — BASIC METABOLIC PANEL
Anion gap: 7 (ref 5–15)
BUN: 9 mg/dL (ref 6–20)
CO2: 26 mmol/L (ref 22–32)
Calcium: 8.6 mg/dL — ABNORMAL LOW (ref 8.9–10.3)
Chloride: 110 mmol/L (ref 98–111)
Creatinine, Ser: 1.11 mg/dL — ABNORMAL HIGH (ref 0.44–1.00)
GFR, Estimated: 59 mL/min — ABNORMAL LOW (ref >60)
Glucose, Bld: 134 mg/dL — ABNORMAL HIGH (ref 70–99)
Potassium: 4.1 mmol/L (ref 3.5–5.1)
Sodium: 143 mmol/L (ref 135–145)

## 2022-01-25 MED ORDER — HYDROCODONE-ACETAMINOPHEN 5-325 MG PO TABS
1.0000 | ORAL_TABLET | Freq: Three times a day (TID) | ORAL | 0 refills | Status: DC | PRN
  Filled 2022-01-25: qty 30, 5d supply, fill #0

## 2022-01-25 MED ORDER — ACETAMINOPHEN 500 MG PO TABS
500.0000 mg | ORAL_TABLET | Freq: Three times a day (TID) | ORAL | 0 refills | Status: DC | PRN
  Filled 2022-01-25: qty 30, 10d supply, fill #0

## 2022-01-25 MED ORDER — METHOCARBAMOL 500 MG PO TABS
500.0000 mg | ORAL_TABLET | Freq: Four times a day (QID) | ORAL | 0 refills | Status: DC | PRN
  Filled 2022-01-25: qty 60, 15d supply, fill #0

## 2022-01-25 MED ORDER — ASPIRIN 325 MG PO TBEC
325.0000 mg | DELAYED_RELEASE_TABLET | Freq: Every day | ORAL | 0 refills | Status: DC
Start: 2022-01-25 — End: 2022-03-07
  Filled 2022-01-25: qty 30, 30d supply, fill #0

## 2022-01-25 MED ORDER — DOCUSATE SODIUM 100 MG PO CAPS
100.0000 mg | ORAL_CAPSULE | Freq: Two times a day (BID) | ORAL | 0 refills | Status: DC
  Filled 2022-01-25: qty 20, 10d supply, fill #0

## 2022-01-25 NOTE — Progress Notes (Signed)
Discharge instructions reviewed with patient, verbalized understanding. IV removed. Awaiting medications to be delivered from [facility] pharmacy.

## 2022-01-25 NOTE — TOC Transition Note (Addendum)
Transition of Care [facility]) - CM/SW Discharge Note   Patient Details  Name: Sara [name] MRN: [id number] Date of Birth: Mar 23, 1966  [facility] CM/SW Contact:  Epifanio Lesches, RN Phone Number: 01/25/2022, 3:17 PM   Clinical Narrative:    Patient will DC to: home Anticipated DC date: 01/25/2022 Family notified:yes, daughter Transport by: car  Admitted for quadricepsplasty and repair of L femur non union.    - S/P repair of left femur nonunion and quadricepsplasty, left thigh, 1/26  Per MD patient ready for DC today . RN, patient, and patient's family notified of DC.  Pt states without DME needs and will f/u with [facility] for outpatient therapy which was established prior to hospitalization. Pt without Rx med concerns. Post hospital f/u noted on AVS. Daughter to provide transportation to home. Virgia Land (Daughter)      [phone]      RNCM will sign off for now as intervention is no longer needed. Please consult [place] again if new needs arise.   Final next level of care: Home/[facility] Center/ outpt therapy) Barriers to Discharge: No Barriers Identified   Patient Goals and CMS Choice        Discharge Placement                       Discharge Plan and Services                                     Social Determinants of Health (SDOH) Interventions     Readmission Risk Interventions No flowsheet data found.

## 2022-01-25 NOTE — Discharge Instructions (Signed)
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  Use walker to mobilize  RANGE OF MOTION/ACTIVITY: Unrestricted range of motion left knee.  We are attempting to get you a continuous passive motion machine (CPM).  We want you in this machine 3 to 4 hours a day.  It is okay to split it up into multiple sessions.  If your motion is less than 90 degrees increase by 5 degrees every day or 2.  Bone health: Vitamin D levels look good.  Continue with vitamin D supplementation  Review the following resource for additional information regarding bone health  [email or web address]  Wound Care: Daily wound care starting on 01/27/2022.  Gauze and tape or Mepilex dressing which is a silicone foam dressing.  These can be purchased at many drug stores or online   Discharge Wound Care Instructions  Do NOT apply any ointments, solutions or lotions to pin sites or surgical wounds.  These prevent needed drainage and even though solutions like hydrogen peroxide kill bacteria, they also damage cells lining the pin sites that help fight infection.  Applying lotions or ointments can keep the wounds moist and can cause them to breakdown and open up as well. This can increase the risk for infection. When in doubt call the office.  Surgical incisions should be dressed daily.  If any drainage is noted, use one layer of adaptic or Mepitel, then gauze and tape or a silicone foam dressing which is what you have on now   [email or web address]  These dressing  supplies should be available at local medical supply stores ([facility] [employer], [facility], etc). They are not usually carried at places like [facility], [facility], [facility], etc  Once the incision is completely dry and without drainage, it may be left open to air out.  Showering may begin 36-48 hours later.  Cleaning gently with soap and water.  DVT/PE prophylaxis: aspirin 325 mg daily x 30 days   Diet: as you were eating previously.  Can use over the counter stool softeners and bowel preparations, such as Miralax, to help with bowel movements.  Narcotics can be constipating.  Be sure to drink plenty of fluids  PAIN MEDICATION USE AND EXPECTATIONS  You have likely been given narcotic medications to help control your pain.  After a traumatic event that results in an fracture (broken bone) with or without surgery, it is ok to use narcotic pain medications to help control one's pain.  We understand that everyone responds to pain differently and each individual patient will be evaluated on a regular basis for the continued need for narcotic medications. Ideally, narcotic medication use should last no more than 6-8 weeks (coinciding with fracture healing).   As a patient it is your responsibility as well to monitor narcotic medication use and report the amount and frequency you use these medications when you come to your office visit.   We would also advise that if you are using narcotic medications, you should take a dose prior to therapy to maximize you participation.  IF YOU ARE ON NARCOTIC MEDICATIONS IT IS NOT PERMISSIBLE TO OPERATE A MOTOR VEHICLE (MOTORCYCLE/CAR/TRUCK/MOPED) OR HEAVY MACHINERY DO NOT MIX NARCOTICS WITH OTHER CNS (CENTRAL NERVOUS SYSTEM) DEPRESSANTS SUCH AS ALCOHOL   POST-OPERATIVE OPIOID TAPER INSTRUCTIONS: It is important to wean off  of your opioid medication as soon as possible. If you do not need pain medication after your surgery it is ok to stop day one. Opioids  include: Codeine, Hydrocodone(Norco, Vicodin), Oxycodone(Percocet, oxycontin) and hydromorphone amongst others.  Long term and even short term use of opiods can cause: Increased pain response Dependence Constipation Depression Respiratory depression And more.  Withdrawal symptoms can include Flu like symptoms Nausea, vomiting And more Techniques to manage these symptoms Hydrate well Eat regular healthy meals Stay active Use relaxation techniques(deep breathing, meditating, yoga) Do Not substitute Alcohol to help with tapering If you have been on opioids for less than two weeks and do not have pain than it is ok to stop all together.  Plan to wean off of opioids This plan should start within one week post op of your fracture surgery  Maintain the same interval or time between taking each dose and first decrease the dose.  Cut the total daily intake of opioids by one tablet each day Next start to increase the time between doses. The last dose that should be eliminated is the evening dose.    STOP SMOKING OR USING NICOTINE PRODUCTS!!!!  As discussed nicotine severely impairs your body's ability to heal surgical and traumatic wounds but also impairs bone healing.  Wounds and bone heal by forming microscopic blood vessels (angiogenesis) and nicotine is a vasoconstrictor (essentially, shrinks blood vessels).  Therefore, if vasoconstriction occurs to these microscopic blood vessels they essentially disappear and are unable to deliver necessary nutrients to the healing tissue.  This is one modifiable factor that you can do to dramatically increase your chances of healing your injury.    (This means no smoking, no nicotine gum, patches, etc)  DO NOT USE NONSTEROIDAL ANTI-INFLAMMATORY DRUGS (NSAID'S)  Using products such as Advil (ibuprofen), Aleve (naproxen), Motrin (ibuprofen) for additional pain control during fracture healing can delay and/or prevent the healing response.  If you would  like to take over the counter (OTC) medication, Tylenol (acetaminophen) is ok.  However, some narcotic medications that are given for pain control contain acetaminophen as well. Therefore, you should not exceed more than 4000 mg of tylenol in a day if you do not have liver disease.  Also note that there are may OTC medicines, such as cold medicines and allergy medicines that my contain tylenol as well.  If you have any questions about medications and/or interactions please ask your doctor/PA or your pharmacist.      ICE AND ELEVATE INJURED/OPERATIVE EXTREMITY  Using ice and elevating the injured extremity above your heart can help with swelling and pain control.  Icing in a pulsatile fashion, such as 20 minutes on and 20 minutes off, can be followed.    Do not place ice directly on skin. Make sure there is a barrier between to skin and the ice pack.    Using frozen items such as frozen peas works well as the conform nicely to the are that needs to be iced.  USE AN ACE WRAP OR TED HOSE FOR SWELLING CONTROL  In addition to icing and elevation, Ace wraps or TED hose are used to help limit and resolve swelling.  It is recommended to use Ace wraps or TED hose until you are informed to stop.    When using Ace Wraps start the wrapping distally (farthest away from the body) and wrap proximally (closer to the body)   Example: If you had surgery on your leg or thing and you do not have  a splint on, start the ace wrap at the toes and work your way up to the thigh        If you had surgery on your upper extremity and do not have a splint on, start the ace wrap at your fingers and work your way up to the upper arm  IF YOU ARE IN A SPLINT OR CAST DO NOT REMOVE IT FOR ANY REASON   If your splint gets wet for any reason please contact the office immediately. You may shower in your splint or cast as long as you keep it dry.  This can be done by wrapping in a cast cover or garbage back (or similar)  Do Not stick any  thing down your splint or cast such as pencils, money, or hangers to try and scratch yourself with.  If you feel itchy take benadryl as prescribed on the bottle for itching  IF YOU ARE IN A CAM BOOT (BLACK BOOT)  You may remove boot periodically. Perform daily dressing changes as noted below.  Wash the liner of the boot regularly and wear a sock when wearing the boot. It is recommended that you sleep in the boot until told otherwise    Call office for the following: Temperature greater than 101F Persistent nausea and vomiting Severe uncontrolled pain Redness, tenderness, or signs of infection (pain, swelling, redness, odor or green/yellow discharge around the site) Difficulty breathing, headache or visual disturbances Hives Persistent dizziness or light-headedness Extreme fatigue Any other questions or concerns you may have after discharge  In an emergency, call 911 or go to an Emergency Department at a nearby hospital  HELPFUL INFORMATION  If you had a block, it will wear off between 8-24 hrs postop typically.  This is period when your pain may go from nearly zero to the pain you would have had postop without the block.  This is an abrupt transition but nothing dangerous is happening.  You may take an extra dose of narcotic when this happens.  You should wean off your narcotic medicines as soon as you are able.  Most patients will be off or using minimal narcotics before their first postop appointment.   We suggest you use the pain medication the first night prior to going to bed, in order to ease any pain when the anesthesia wears off. You should avoid taking pain medications on an empty stomach as it will make you nauseous.  Do not drink alcoholic beverages or take illicit drugs when taking pain medications.  In most states it is against the law to drive while you are in a splint or sling.  And certainly against the law to drive while taking narcotics.  You may return to  work/school in the next couple of days when you feel up to it.   Pain medication may make you constipated.  Below are a few solutions to try in this order: Decrease the amount of pain medication if you arent having pain. Drink lots of decaffeinated fluids. Drink prune juice and/or each dried prunes  If the first 3 dont work start with additional solutions Take Colace - an over-the-counter stool softener Take Senokot - an over-the-counter laxative Take Miralax - a stronger over-the-counter laxative     CALL THE OFFICE WITH ANY QUESTIONS OR CONCERNS: [phone]   VISIT OUR WEBSITE FOR ADDITIONAL INFORMATION: [email or web address]

## 2022-01-25 NOTE — Discharge Summary (Signed)
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 tape or silicone foam dressing such as a Mepilex Orthopedic device(s):  Walker and CPM Showering: Okay to shower clean wounds with soap and water only VTE prophylaxis: Aspirin 325 mg daily x 30 days Pain control: Tylenol and Norco Bone Health/Optimization: Continue with vitamin D supplementation.  Current vitamin D levels look good Follow - up plan: 2 weeks Contact information: Myrene Galas MD, Montez Morita PA-C   Signed:  Mearl Latin, PA-C [phone] (C) 01/25/2022, 11:44 AM  [facility] [address] [place] [address] [phone] Collier Bullock (F)

## 2022-01-25 NOTE — Evaluation (Signed)
Physical Therapy Evaluation Patient Details Name: Sara [name] MRN: [id number] DOB: 03/14/1966 Today's Date: 01/25/2022  History of Present Illness  Pt is a 56 year old woman admitted on 01/24/22 for quadricepsplasty and repair of L femur non union. Pt sustained polytrauma in September 2021. She was going to OPPT. PMH: ADHD, depression, thyroid disease.  Clinical Impression  Pt was seen for progression of mobility in her room with good ability to maintain PWB on LLE.  However, her ability to sit and stand requires some help due to flexion limits on LLE knee.  Her plan is to work on ROM and standing balance control, and to increase her independence to stand for safety and support of home care.  Pt may not be able to have HHPT depending on her insurance so will default to outpatient care if needed.  No equipment needs for now, and will focus on goals of acute PT during her stay.     Recommendations for follow up therapy are one component of a multi-disciplinary discharge planning process, led by the attending physician.  Recommendations may be updated based on patient status, additional functional criteria and insurance authorization.  Follow Up Recommendations Home health PT    Assistance Recommended at Discharge Intermittent Supervision/Assistance  Patient can return home with the following  A little help with walking and/or transfers;A little help with bathing/dressing/bathroom;Assistance with cooking/housework;Assist for transportation;Help with stairs or ramp for entrance    Equipment Recommendations None recommended by PT  Recommendations for Other Services       Functional Status Assessment Patient has had a recent decline in their functional status and demonstrates the ability to make significant improvements in function in a reasonable and predictable amount of time.     Precautions / Restrictions Precautions Precautions: Fall Restrictions Weight Bearing Restrictions: Yes LLE  Weight Bearing: Partial weight bearing Other Position/Activity Restrictions: unrestricted L knee ROM      Mobility  Bed Mobility Overal bed mobility: Needs Assistance Bed Mobility: Sit to Supine       Sit to supine: Min assist   General bed mobility comments: min assist to get LLE back to bed    Transfers Overall transfer level: Needs assistance Equipment used: Rolling walker (2 wheels) Transfers: Sit to/from Stand Sit to Stand: Min assist           General transfer comment: reminders to shift LLE but pt is aware    Ambulation/Gait Ambulation/Gait assistance: Min guard Gait Distance (Feet): 40 Feet (30+10) Assistive device: Rolling walker (2 wheels) Gait Pattern/deviations: Step-to pattern, Step-through pattern, Decreased stride length, Wide base of support Gait velocity: reduced Gait velocity interpretation: <1.31 ft/sec, indicative of household ambulator Pre-gait activities: standing wgt shift    Stairs            Wheelchair Mobility    Modified Rankin (Stroke Patients Only)       Balance Overall balance assessment: Needs assistance   Sitting balance-Leahy Scale: Good     Standing balance support: Bilateral upper extremity supported Standing balance-Leahy Scale: Poor Standing balance comment: forward lean on UE's on walker                             Pertinent Vitals/Pain Pain Assessment Pain Assessment: 0-10 Pain Score: 8  Pain Location: L LE Pain Descriptors / Indicators: Grimacing, Operative site guarding, Tightness Pain Intervention(s): Limited activity within patient's tolerance, Monitored during session, Premedicated before session, Repositioned, Ice applied  Home Living Family/patient expects to be discharged to:: Private residence Living Arrangements: Parent;Children Available Help at Discharge: Family;Available 24 hours/day Type of Home: House Home Access: Ramped entrance       Home Layout: Two level;Able to  live on main level with bedroom/bathroom Home Equipment: Rolling Walker (2 wheels);Hand held shower head;Tub bench;BSC/3in1;Wheelchair - manual;Grab bars - tub/shower Additional Comments: expecting a rollator to arrive today    Prior Function Prior Level of Function : Needs assist       Physical Assist : Mobility (physical) Mobility (physical): Stairs   Mobility Comments: RW most of the time due to non union ADLs Comments: son is helping with her care     Hand Dominance   Dominant Hand: Right    Extremity/Trunk Assessment   Upper Extremity Assessment Upper Extremity Assessment: Defer to OT evaluation    Lower Extremity Assessment Lower Extremity Assessment: LLE deficits/detail LLE Deficits / Details: Pt is flexing 40 deg AAROM, otherwise strength is grossly 4+ hip and ankle LLE Sensation: WNL LLE Coordination: decreased gross motor    Cervical / Trunk Assessment Cervical / Trunk Assessment: Normal  Communication   Communication: No difficulties  Cognition Arousal/Alertness: Awake/alert Behavior During Therapy: WFL for tasks assessed/performed Overall Cognitive Status: Within Functional Limits for tasks assessed                                          General Comments General comments (skin integrity, edema, etc.): Pt is up to walk with help and struggling with transition to stand and sit mainly due to her ROM loss on L knee.  Gait is reasonable with min guard, able to maintain PWB on LLE    Exercises     Assessment/Plan    PT Assessment Patient needs continued PT services  PT Problem List Decreased strength;Decreased range of motion;Decreased activity tolerance;Decreased balance;Decreased mobility;Decreased coordination;Decreased skin integrity;Pain       PT Treatment Interventions DME instruction;Gait training;Functional mobility training;Therapeutic activities;Therapeutic exercise;Balance training;Neuromuscular re-education;Patient/family  education    PT Goals (Current goals can be found in the Care Plan section)  Acute Rehab PT Goals Patient Stated Goal: to go home with family PT Goal Formulation: With patient Time For Goal Achievement: 02/01/22 Potential to Achieve Goals: Good    Frequency Min 5X/week     Co-evaluation               AM-PAC PT "6 Clicks" Mobility  Outcome Measure Help needed turning from your back to your side while in a flat bed without using bedrails?: A Little Help needed moving from lying on your back to sitting on the side of a flat bed without using bedrails?: A Little Help needed moving to and from a bed to a chair (including a wheelchair)?: A Little Help needed standing up from a chair using your arms (e.g., wheelchair or bedside chair)?: A Little Help needed to walk in hospital room?: A Little Help needed climbing 3-5 steps with a railing? : A Lot 6 Click Score: 17    End of Session Equipment Utilized During Treatment: Gait belt Activity Tolerance: Patient tolerated treatment well Patient left: in bed;with call bell/phone within reach;with bed alarm set Nurse Communication: Mobility status PT Visit Diagnosis: Unsteadiness on feet (R26.81);Muscle weakness (generalized) (M62.81);Pain;Difficulty in walking, not elsewhere classified (R26.2) Pain - Right/Left: Left Pain - part of body: Knee    Time: [id number] PT  Time Calculation (min) (ACUTE ONLY): 47 min   Charges:   PT Evaluation $PT Eval Moderate Complexity: 1 Mod PT Treatments $Gait Training: 8-22 mins $Therapeutic Exercise: 8-22 mins       Ivar Drape 01/25/2022, 1:13 PM  Samul Dada, PT PhD Acute Rehab Dept. Number: [facility] [id number] and [facility] [phone]

## 2022-01-25 NOTE — Evaluation (Signed)
Occupational Therapy Evaluation Patient Details Name: Sara GlassmanMargaret A [name] MRN: [id number] DOB: 06-23-1966 Today's Date: 01/25/2022   History of Present Illness Pt is a 56 year old woman admitted on 01/24/22 for quadricepsplasty and repair of L femur non union. Pt sustained polytrauma in September 2021. She was going to OPPT. PMH: ADHD, depression, thyroid disease.   Clinical Impression   Pt was ambulating with a RW, but had been able to walk without it prior to admission. She sometimes had assist for donning her L sock. Her son assists with IADL as needed and transportaiton. She lives with her son and mother. Pt presents with L LE pain, generalized weakness and impaired standing balance. She needs min assist to stand and min guard assist for OOB with RW and cues for technique. She requires set up to moderate assistance for ADL. Pt is not interested in AE for LB bathing and dressing. She has excellent family support. Will follow acutely, do not anticipate pt will need post acute OT. She plans to resume OPPT when MD approves.      Recommendations for follow up therapy are one component of a multi-disciplinary discharge planning process, led by the attending physician.  Recommendations may be updated based on patient status, additional functional criteria and insurance authorization.   Follow Up Recommendations  No OT follow up    Assistance Recommended at Discharge Frequent or constant Supervision/Assistance  Patient can return home with the following A little help with walking and/or transfers;A little help with bathing/dressing/bathroom;Assistance with cooking/housework;Assist for transportation;Help with stairs or ramp for entrance    Functional Status Assessment  Patient has had a recent decline in their functional status and demonstrates the ability to make significant improvements in function in a reasonable and predictable amount of time.  Equipment Recommendations  None recommended by OT     Recommendations for Other Services       Precautions / Restrictions Precautions Precautions: Fall Restrictions Weight Bearing Restrictions: Yes LLE Weight Bearing: Weight bearing as tolerated Other Position/Activity Restrictions: unrestricted L knee ROM      Mobility Bed Mobility Overal bed mobility: Modified Independent             General bed mobility comments: HOB up, increased time, use of rail, pt sleeps in a recliner at home    Transfers Overall transfer level: Needs assistance Equipment used: Rolling walker (2 wheels) Transfers: Sit to/from Stand, Bed to chair/wheelchair/BSC Sit to Stand: Min assist     Step pivot transfers: Min guard     General transfer comment: cues for technique, increased time, assist to rise and steady      Balance Overall balance assessment: Needs assistance   Sitting balance-Leahy Scale: Good     Standing balance support: Bilateral upper extremity supported Standing balance-Leahy Scale: Poor Standing balance comment: heavily reliant on UEs                           ADL either performed or assessed with clinical judgement   ADL Overall ADL's : Needs assistance/impaired Eating/Feeding: Independent;Sitting   Grooming: Set up;Sitting;Wash/dry hands   Upper Body Bathing: Set up;Sitting   Lower Body Bathing: Moderate assistance;Sit to/from stand   Upper Body Dressing : Set up;Sitting   Lower Body Dressing: Moderate assistance;Sit to/from stand   Toilet Transfer: Min guard;Stand-pivot;BSC/3in1;Rolling walker (2 wheels)   Toileting- Clothing Manipulation and Hygiene: Minimal assistance;Sit to/from stand  Vision Baseline Vision/History: 1 Wears glasses Ability to See in Adequate Light: 0 Adequate Patient Visual Report: No change from baseline       Perception     Praxis      Pertinent Vitals/Pain Pain Assessment Pain Assessment: 0-10 Pain Score: 7  Pain Location: L LE Pain  Descriptors / Indicators: Aching Pain Intervention(s): Repositioned, Ice applied     Hand Dominance Right   Extremity/Trunk Assessment Upper Extremity Assessment Upper Extremity Assessment: Overall WFL for tasks assessed   Lower Extremity Assessment Lower Extremity Assessment: Defer to PT evaluation   Cervical / Trunk Assessment Cervical / Trunk Assessment: Normal   Communication Communication Communication: No difficulties   Cognition Arousal/Alertness: Awake/alert Behavior During Therapy: WFL for tasks assessed/performed Overall Cognitive Status: Within Functional Limits for tasks assessed                                       General Comments       Exercises     Shoulder Instructions      Home Living Family/patient expects to be discharged to:: Private residence Living Arrangements: Parent;Children Available Help at Discharge: Family;Available 24 hours/day Type of Home: House Home Access: Ramped entrance     Home Layout: Two level;Able to live on main level with bedroom/bathroom     Bathroom Shower/Tub: Tub/shower unit         Home Equipment: Agricultural consultant (2 wheels);Hand held shower head;Tub bench;BSC/3in1;Wheelchair - manual;Grab bars - tub/shower          Prior Functioning/Environment Prior Level of Function : Needs assist             Mobility Comments: ambulating with RW or sometimes without ADLs Comments: occasional help to don L sock, assist for IADL by son        OT Problem List: Decreased strength;Impaired balance (sitting and/or standing);Decreased knowledge of use of DME or AE;Pain      OT Treatment/Interventions: Gude-care/ADL training;DME and/or AE instruction;Patient/family education;Balance training;Therapeutic activities    OT Goals(Current goals can be found in the care plan section) Acute Rehab OT Goals OT Goal Formulation: With patient Time For Goal Achievement: 02/08/22 Potential to Achieve Goals:  Good ADL Goals Pt Will Perform Grooming: with supervision;standing Pt Will Perform Lower Body Bathing: with supervision;sit to/from stand Pt Will Perform Lower Body Dressing: with supervision;sit to/from stand Pt Will Transfer to Toilet: with supervision;ambulating;bedside commode (over toilet) Pt Will Perform Toileting - Clothing Manipulation and hygiene: with supervision;sit to/from stand  OT Frequency: Min 2X/week    Co-evaluation              AM-PAC OT "6 Clicks" Daily Activity     Outcome Measure Help from another person eating meals?: None Help from another person taking care of personal grooming?: A Little Help from another person toileting, which includes using toliet, bedpan, or urinal?: A Little Help from another person bathing (including washing, rinsing, drying)?: A Lot Help from another person to put on and taking off regular upper body clothing?: A Little Help from another person to put on and taking off regular lower body clothing?: A Lot 6 Click Score: 17   End of Session Equipment Utilized During Treatment: Rolling walker (2 wheels);Gait belt  Activity Tolerance: Patient tolerated treatment well Patient left: in chair;with call bell/phone within reach  OT Visit Diagnosis: Unsteadiness on feet (R26.81);Other abnormalities of gait and mobility (R26.89);Pain;Muscle weakness (  generalized) (M62.81)                Time: 7867-6720 OT Time Calculation (min): 26 min Charges:  OT General Charges $OT Visit: 1 Visit OT Evaluation $OT Eval Moderate Complexity: 1 Mod OT Treatments $[place] Care/Home Management : 8-22 mins  Martie Round, OTR/L Acute Rehabilitation Services Pager: [phone] Office: [phone]  Evern Bio 01/25/2022, 9:51 AM

## 2022-01-25 NOTE — Progress Notes (Signed)
[facility] Progress Note  Patient ID: Sara [name] MRN: [id number] DOB/AGE: 03/02/1966 56 y.o.  Subjective:  Doing well Pain controlled Wants to go home today  No other complaints  Tolerating CPM   Intraoperative cultures showed no growth to date and no organisms seen on gram stain  ROS As above  Objective:   VITALS:   Vitals:   01/24/22 1548 01/24/22 2043 01/25/22 0447 01/25/22 0816  BP: (!) 95/57 (!) 102/59 (!) 101/50 (!) 97/48  Pulse: 68 78 66 66  Resp: 16 19 18 16   Temp: 97.8 F (36.6 C) 98.1 F (36.7 C) 98.5 F (36.9 C) 98.7 F (37.1 C)  TempSrc: Oral Oral Oral Oral  SpO2: 98% 95% 97% 96%    Estimated body mass index is 37.71 kg/m as calculated from the following:   Height as of 01/22/22: 5\' 5"  (1.651 m).   Weight as of 01/22/22: 102.8 kg.   Intake/Output      01/26 0701 01/27 0700 01/27 0701 01/28 0700   I.V. 1750    IV Piggyback 650    Total Intake 2400    Urine 75    Stool 425    Blood 600    Total Output 1100    Net +1300         Urine Occurrence 1 x      LABS  Results for orders placed or performed during the hospital encounter of 01/24/22 (from the past 24 hour(s))  CBC     Status: Abnormal   Collection Time: 01/24/22 12:52 PM  Result Value Ref Range   WBC 9.5 4.0 - 10.5 K/uL   RBC 3.84 (L) 3.87 - 5.11 MIL/uL   Hemoglobin 11.5 (L) 12.0 - 15.0 g/dL   HCT 37.1 36.0 - 46.0 %   MCV 96.6 80.0 - 100.0 fL   MCH 29.9 26.0 - 34.0 pg   MCHC 31.0 30.0 - 36.0 g/dL   RDW 13.4 11.5 - 15.5 %   Platelets 190 150 - 400 K/uL   nRBC 0.0 0.0 - 0.2 %  Basic metabolic panel     Status: Abnormal   Collection Time: 01/25/22  1:30 AM  Result Value Ref Range   Sodium 143 135 - 145 mmol/L   Potassium 4.1 3.5 - 5.1 mmol/L   Chloride 110 98 - 111 mmol/L   CO2 26 22 - 32 mmol/L   Glucose, Bld 134 (H) 70 - 99 mg/dL   BUN 9 6 - 20 mg/dL   Creatinine, Ser 1.11 (H)  0.44 - 1.00 mg/dL   Calcium 8.6 (L) 8.9 - 10.3 mg/dL   GFR, Estimated 59 (L) >60 mL/min   Anion gap 7 5 - 15     PHYSICAL EXAM:   Gen: pleasant, sitting up in chair eating breakfast  Lungs: unlabored  Cardiac: RRR Abd: + BS, NTND Ext:       Left Lower Extremity   Dressing clean, dry and intact  Ext warm   + DP pulse  Distal motor and sensory functions intact  No DCT  Compartments are soft      Assessment/Plan: 1 Day Post-Op   Principal Problem:   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 with allografting, left quadriceps plasty for left knee contracture  PWB L leg (50% BW)  Aggressive L knee ROM    Home CPM    3-4 hours a day   Increase by 5 degrees daily if under 90 degrees   Dressing changes starting on 01/27/2022  Therapy evals    Home today   - Pain management:  Multimodal   - ABL anemia/Hemodynamics  Stable  - Medical issues   Home meds  - DVT/PE prophylaxis:  ASA 325 daily x 30 days   - ID:   Periop abx   - Metabolic Bone Disease:  Vitamin d levels look good   - Activity:  As tolerated  - FEN/GI prophylaxis/Foley/Lines:  Reg diet  -  Impediments to fracture healing:  Established nonunion    - Dispo:  Home today   Follow up with ortho in 2 weeks  Meds to be sent to [place] has all equipment already     Jari Pigg, PA-C [phone] (C) 01/25/2022, 11:20 AM  [facility] [place] [address] [phone] Jenetta Downer) [phone] (F)    After 5pm and on the weekends please log on to Amion, go to orthopaedics and the look under the [facility] Call for the provider(s) on call. You can also call our office at [phone] and then follow the prompts to be connected to the call team.   Patient ID: Sara [name], female   DOB: 06/28/1966, 56 y.o.   MRN: [id number]
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
## 2022-01-27 NOTE — Anesthesia Postprocedure Evaluation (Signed)
Anesthesia Post Note  Patient: Sara [name]  Procedure(s) Performed: REPAIR LEFT FEMUR NONUNION, QUADRECEPLASTY (Left)     Patient location during evaluation: PACU Anesthesia Type: General Level of consciousness: awake and alert Pain management: pain level controlled Vital Signs Assessment: post-procedure vital signs reviewed and stable Respiratory status: spontaneous breathing, nonlabored ventilation, respiratory function stable and patient connected to nasal cannula oxygen Cardiovascular status: blood pressure returned to baseline and stable Postop Assessment: no apparent nausea or vomiting Anesthetic complications: no   No notable events documented.  Last Vitals:  Vitals:   01/25/22 0447 01/25/22 0816  BP: (!) 101/50 (!) 97/48  Pulse: 66 66  Resp: 18 16  Temp: 36.9 C 37.1 C  SpO2: 97% 96%    Last Pain:  Vitals:   01/25/22 1303  TempSrc:   PainSc: 4                  Cahlil Sattar S

## 2022-01-29 ENCOUNTER — Ambulatory Visit: Payer: Medicaid Other

## 2022-01-30 ENCOUNTER — Ambulatory Visit: Payer: Medicaid Other | Admitting: Physical Therapy

## 2022-01-31 LAB — AEROBIC/ANAEROBIC CULTURE W GRAM STAIN (SURGICAL/DEEP WOUND): Culture: NO GROWTH

## 2022-02-06 ENCOUNTER — Ambulatory Visit: Payer: Medicaid Other

## 2022-02-06 DIAGNOSIS — S92061D Displaced intraarticular fracture of right calcaneus, subsequent encounter for fracture with routine healing: Secondary | ICD-10-CM | POA: Diagnosis not present

## 2022-02-06 DIAGNOSIS — M24562 Contracture, left knee: Secondary | ICD-10-CM | POA: Diagnosis not present

## 2022-02-06 DIAGNOSIS — S72452N Displaced supracondylar fracture without intracondylar extension of lower end of left femur, subsequent encounter for open fracture type IIIA, IIIB, or IIIC with nonunion: Secondary | ICD-10-CM | POA: Diagnosis not present

## 2022-02-07 DIAGNOSIS — Z434 Encounter for attention to other artificial openings of digestive tract: Secondary | ICD-10-CM | POA: Diagnosis not present

## 2022-02-13 ENCOUNTER — Ambulatory Visit: Payer: Medicaid Other

## 2022-02-13 DIAGNOSIS — K439 Ventral hernia without obstruction or gangrene: Secondary | ICD-10-CM | POA: Diagnosis not present

## 2022-02-13 DIAGNOSIS — Z933 Colostomy status: Secondary | ICD-10-CM | POA: Diagnosis not present

## 2022-02-20 ENCOUNTER — Ambulatory Visit: Payer: Medicaid Other | Attending: Physical Medicine and Rehabilitation

## 2022-02-20 ENCOUNTER — Telehealth: Payer: Self-pay

## 2022-02-20 ENCOUNTER — Other Ambulatory Visit: Payer: Self-pay

## 2022-02-20 DIAGNOSIS — R269 Unspecified abnormalities of gait and mobility: Secondary | ICD-10-CM

## 2022-02-20 DIAGNOSIS — R2681 Unsteadiness on feet: Secondary | ICD-10-CM | POA: Diagnosis not present

## 2022-02-20 DIAGNOSIS — R262 Difficulty in walking, not elsewhere classified: Secondary | ICD-10-CM

## 2022-02-20 DIAGNOSIS — R2689 Other abnormalities of gait and mobility: Secondary | ICD-10-CM | POA: Insufficient documentation

## 2022-02-20 DIAGNOSIS — M6281 Muscle weakness (generalized): Secondary | ICD-10-CM

## 2022-02-20 DIAGNOSIS — G8929 Other chronic pain: Secondary | ICD-10-CM

## 2022-02-20 DIAGNOSIS — M25562 Pain in left knee: Secondary | ICD-10-CM | POA: Diagnosis not present

## 2022-02-20 NOTE — Telephone Encounter (Signed)
CALLED PATIENT NO ANSWER LEFT VOICEMAIL FOR A CALL BACK ? ?

## 2022-02-21 ENCOUNTER — Telehealth: Payer: Self-pay

## 2022-02-21 DIAGNOSIS — Z434 Encounter for attention to other artificial openings of digestive tract: Secondary | ICD-10-CM | POA: Diagnosis not present
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
## 2022-02-21 NOTE — Telephone Encounter (Signed)
Scheduled for 05/08/2022 ?

## 2022-02-22 DIAGNOSIS — S7292XA Unspecified fracture of left femur, initial encounter for closed fracture: Secondary | ICD-10-CM | POA: Diagnosis not present

## 2022-02-26 ENCOUNTER — Ambulatory Visit: Payer: Medicaid Other | Admitting: Podiatry

## 2022-02-26 ENCOUNTER — Ambulatory Visit: Payer: Medicaid Other

## 2022-02-26 ENCOUNTER — Other Ambulatory Visit: Payer: Self-pay

## 2022-02-26 DIAGNOSIS — G8929 Other chronic pain: Secondary | ICD-10-CM

## 2022-02-26 DIAGNOSIS — M25562 Pain in left knee: Secondary | ICD-10-CM

## 2022-02-26 DIAGNOSIS — I999 Unspecified disorder of circulatory system: Secondary | ICD-10-CM

## 2022-02-26 DIAGNOSIS — R2689 Other abnormalities of gait and mobility: Secondary | ICD-10-CM | POA: Diagnosis not present

## 2022-02-26 DIAGNOSIS — R262 Difficulty in walking, not elsewhere classified: Secondary | ICD-10-CM | POA: Diagnosis not present

## 2022-02-26 DIAGNOSIS — M6281 Muscle weakness (generalized): Secondary | ICD-10-CM | POA: Diagnosis not present

## 2022-02-26 DIAGNOSIS — R2681 Unsteadiness on feet: Secondary | ICD-10-CM | POA: Diagnosis not present

## 2022-02-26 DIAGNOSIS — L600 Ingrowing nail: Secondary | ICD-10-CM

## 2022-02-26 DIAGNOSIS — R269 Unspecified abnormalities of gait and mobility: Secondary | ICD-10-CM | POA: Diagnosis not present
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
## 2022-02-28 ENCOUNTER — Ambulatory Visit: Payer: Medicaid Other | Attending: Physical Medicine and Rehabilitation

## 2022-02-28 ENCOUNTER — Other Ambulatory Visit: Payer: Self-pay

## 2022-02-28 DIAGNOSIS — R2689 Other abnormalities of gait and mobility: Secondary | ICD-10-CM | POA: Diagnosis not present

## 2022-02-28 DIAGNOSIS — M6281 Muscle weakness (generalized): Secondary | ICD-10-CM | POA: Diagnosis not present

## 2022-02-28 DIAGNOSIS — M25562 Pain in left knee: Secondary | ICD-10-CM | POA: Diagnosis not present

## 2022-02-28 DIAGNOSIS — R262 Difficulty in walking, not elsewhere classified: Secondary | ICD-10-CM | POA: Insufficient documentation

## 2022-02-28 DIAGNOSIS — G8929 Other chronic pain: Secondary | ICD-10-CM | POA: Diagnosis not present

## 2022-02-28 DIAGNOSIS — R269 Unspecified abnormalities of gait and mobility: Secondary | ICD-10-CM | POA: Diagnosis not present

## 2022-02-28 DIAGNOSIS — R2681 Unsteadiness on feet: Secondary | ICD-10-CM | POA: Insufficient documentation
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
## 2022-03-01 NOTE — Progress Notes (Signed)
Subjective:  Patient ID: Sara [name], female    DOB: 09-30-1966,  MRN: [id number]  No chief complaint on file.   56 y.o. female presents with the above complaint.  Patient complains of bilateral medial border ingrown.  Patient states painful to touch has progressed gotten worse.  She still would like to have it removed.  She has had it done in the past.  But it seems to grow back is causing a lot of pain.  She also has secondary complaint a lot of claudication type of pain lateral breast pain there is some dependent rubor.  She does have any vascular history that she is aware of.   Review of Systems: Negative except as noted in the HPI. Denies N/V/F/Ch.  Past Medical History:  Diagnosis Date   ADHD    Contracture of left knee 01/25/2022   Depression    Dyspnea    Plantar fasciitis    bilateral feet   Thyroid disease     Current Outpatient Medications:    acetaminophen (TYLENOL) 500 MG tablet, Take 1 tablet (500 mg total) by mouth every 8 (eight) hours as needed for moderate pain or mild pain., Disp: 30 tablet, Rfl: 0   albuterol (VENTOLIN HFA) 108 (90 Base) MCG/ACT inhaler, Inhale 2 puffs into the lungs every 6 (six) hours as needed for wheezing or shortness of breath., Disp: , Rfl:    aspirin 325 MG EC tablet, Take 1 tablet (325 mg total) by mouth daily., Disp: 30 tablet, Rfl: 0   diclofenac Sodium (VOLTAREN) 1 % GEL, APPLY 2 GRAMS TOPICALLY TWICE DAILY TO RIGHT ELBOW (Patient taking differently: Apply 1 application topically 2 (two) times daily as needed (pain).), Disp: 300 g, Rfl: 1   docusate sodium (COLACE) 100 MG capsule, Take 1 capsule (100 mg total) by mouth 2 (two) times daily., Disp: 20 capsule, Rfl: 0   fluticasone (FLONASE) 50 MCG/ACT nasal spray, Place 2 sprays into both nostrils daily. (Patient taking differently: Place 2 sprays into both nostrils daily as needed for allergies.), Disp: 9.9 mL, Rfl: 2   gabapentin (NEURONTIN) 300 MG capsule, TAKE 2 CAPSULES BY MOUTH AT  BEDTIME, Disp: 60 capsule, Rfl: 5   HYDROcodone-acetaminophen (NORCO/VICODIN) 5-325 MG tablet, Take 1-2 tablets by mouth every 8 (eight) hours as needed for moderate pain (pain score 4-6)., Disp: 30 tablet, Rfl: 0   magnesium oxide (MAG-OX) 400 (241.3 Mg) MG tablet, Take 2 tablets (800 mg total) by mouth 2 (two) times daily. (Patient taking differently: Take 400 mg by mouth daily.), Disp: 120 tablet, Rfl: 0   methocarbamol (ROBAXIN) 500 MG tablet, Take 1 tablet (500 mg total) by mouth every 6 (six) hours as needed for muscle spasms., Disp: 60 tablet, Rfl: 0   ondansetron (ZOFRAN) 4 MG tablet, TAKE 1 TABLET BY MOUTH 3 TIMES DAILY (Patient taking differently: Take 4 mg by mouth every 8 (eight) hours as needed for nausea or vomiting.), Disp: 120 tablet, Rfl: 5   potassium chloride SA (KLOR-CON) 20 MEQ tablet, Take 1 tablet (20 mEq total) by mouth 2 (two) times daily. (Patient taking differently: Take 20 mEq by mouth daily.), Disp: 60 tablet, Rfl: 0   spironolactone (ALDACTONE) 25 MG tablet, Take 25 mg by mouth daily., Disp: , Rfl:    TRINTELLIX 20 MG TABS tablet, TAKE 1 TABLET BY MOUTH ONCE DAILY, Disp: 30 tablet, Rfl: 0  Social History   Tobacco Use  Smoking Status Former  Smokeless Tobacco Former    Allergies  Allergen Reactions  Azithromycin Diarrhea and Nausea And Vomiting   Benzalkonium Chloride Other (See Comments)    Pt unsure of allergy    Augmentin [Amoxicillin-Pot Clavulanate] Nausea Only   Ciprofloxacin Nausea Only   Neomycin Rash   Neosporin [Bacitracin-Polymyxin B] Itching and Rash   Neosporin [Neomycin-Bacitracin Zn-Polymyx] Rash   Septra [Sulfamethoxazole-Trimethoprim] Nausea Only   Objective:  There were no vitals filed for this visit. There is no height or weight on file to calculate BMI. Constitutional Well developed. Well nourished.  Vascular Dorsalis pedis pulses non palpable bilaterally. Posterior tibial pulses non palpable bilaterally. Capillary refill  diminished to all digits.  No cyanosis or clubbing noted. Pedal hair growth not present  Neurologic Normal speech. Oriented to person, place, and time. Epicritic sensation to light touch grossly present bilaterally.  Dermatologic Painful ingrowing nail at medial nail borders of the hallux nail right. No other open wounds. No skin lesions.  Orthopedic: Normal joint ROM without pain or crepitus bilaterally. No visible deformities. No bony tenderness.   Radiographs: None Assessment:   1. Vascular abnormality   2. Ingrown left big toenail   3. Ingrown toenail of right foot    Plan:  Patient was evaluated and treated and all questions answered.  Ingrown Nail, bilaterally with underlying vascular abnormality -I discussed with the patient that I will be happy to remove the ingrown once I know that her vascular status is good.  I discussed with her that given that she has signs of dependent rubor she will benefit from ABIs PVRs prior to undergoing ingrown nail removal.  If she has adequate flow we will continue with ingrown nail removal otherwise she may need a vascular follow-up.  She states understanding -ABIs PVRs were ordered No follow-ups on file.

## 2022-03-04 ENCOUNTER — Other Ambulatory Visit: Payer: Self-pay

## 2022-03-04 ENCOUNTER — Ambulatory Visit: Payer: Medicaid Other

## 2022-03-04 DIAGNOSIS — R262 Difficulty in walking, not elsewhere classified: Secondary | ICD-10-CM | POA: Diagnosis not present

## 2022-03-04 DIAGNOSIS — R2689 Other abnormalities of gait and mobility: Secondary | ICD-10-CM | POA: Diagnosis not present

## 2022-03-04 DIAGNOSIS — M25562 Pain in left knee: Secondary | ICD-10-CM | POA: Diagnosis not present

## 2022-03-04 DIAGNOSIS — R269 Unspecified abnormalities of gait and mobility: Secondary | ICD-10-CM | POA: Diagnosis not present

## 2022-03-04 DIAGNOSIS — R2681 Unsteadiness on feet: Secondary | ICD-10-CM

## 2022-03-04 DIAGNOSIS — M6281 Muscle weakness (generalized): Secondary | ICD-10-CM

## 2022-03-04 DIAGNOSIS — G8929 Other chronic pain: Secondary | ICD-10-CM | POA: Diagnosis not present
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
## 2022-03-06 ENCOUNTER — Ambulatory Visit: Payer: Medicaid Other

## 2022-03-06 ENCOUNTER — Other Ambulatory Visit: Payer: Self-pay | Admitting: Orthopedic Surgery

## 2022-03-06 DIAGNOSIS — S92061D Displaced intraarticular fracture of right calcaneus, subsequent encounter for fracture with routine healing: Secondary | ICD-10-CM | POA: Diagnosis not present

## 2022-03-06 DIAGNOSIS — M19071 Primary osteoarthritis, right ankle and foot: Secondary | ICD-10-CM

## 2022-03-06 DIAGNOSIS — S72452N Displaced supracondylar fracture without intracondylar extension of lower end of left femur, subsequent encounter for open fracture type IIIA, IIIB, or IIIC with nonunion: Secondary | ICD-10-CM | POA: Diagnosis not present

## 2022-03-06 DIAGNOSIS — M24562 Contracture, left knee: Secondary | ICD-10-CM | POA: Diagnosis not present

## 2022-03-07 ENCOUNTER — Other Ambulatory Visit: Payer: Self-pay

## 2022-03-07 ENCOUNTER — Other Ambulatory Visit: Payer: Self-pay | Admitting: Nurse Practitioner

## 2022-03-07 ENCOUNTER — Telehealth: Payer: Self-pay

## 2022-03-07 DIAGNOSIS — Z1211 Encounter for screening for malignant neoplasm of colon: Secondary | ICD-10-CM

## 2022-03-07 MED ORDER — SUTAB 1479-225-188 MG PO TABS
12.0000 | ORAL_TABLET | Freq: Once | ORAL | 0 refills | Status: AC
Start: 2022-03-07 — End: 2022-03-07

## 2022-03-07 NOTE — Telephone Encounter (Signed)
Called patient back and asked her to do standard prep and use a fleet enema as well night before procedure patient understands and agrees per Dr Marius Ditch ?

## 2022-03-07 NOTE — Progress Notes (Signed)
Gastroenterology Pre-Procedure Review ? ?Request Date: 03/27/023 ?Requesting Physician: Dr. Allegra Lai ? ?PATIENT REVIEW QUESTIONS: The patient responded to the following health history questions as indicated:   ? ?1. Are you having any GI issues? no ?2. Do you have a personal history of Polyps? no ?3. Do you have a family history of Colon Cancer or Polyps? no ?4. Diabetes Mellitus? no ?5. Joint replacements in the past 12 months?no ?6. Major health problems in the past 3 months?no ?7. Any artificial heart valves, MVP, or defibrillator?no ?   ?MEDICATIONS & ALLERGIES:    ?Patient reports the following regarding taking any anticoagulation/antiplatelet therapy:   ?Plavix, Coumadin, Eliquis, Xarelto, Lovenox, Pradaxa, Brilinta, or Effient? no ?Aspirin? no ? ?Patient confirms/reports the following medications:  ?Current Outpatient Medications  ?Medication Sig Dispense Refill  ? acetaminophen (TYLENOL) 500 MG tablet Take 1 tablet (500 mg total) by mouth every 8 (eight) hours as needed for moderate pain or mild pain. 30 tablet 0  ? albuterol (VENTOLIN HFA) 108 (90 Base) MCG/ACT inhaler Inhale 2 puffs into the lungs every 6 (six) hours as needed for wheezing or shortness of breath.    ? diclofenac Sodium (VOLTAREN) 1 % GEL APPLY 2 GRAMS TOPICALLY TWICE DAILY TO RIGHT ELBOW (Patient taking differently: Apply 1 application topically 2 (two) times daily as needed (pain).) 300 g 1  ? docusate sodium (COLACE) 100 MG capsule Take 1 capsule (100 mg total) by mouth 2 (two) times daily. 20 capsule 0  ? fluticasone (FLONASE) 50 MCG/ACT nasal spray Place 2 sprays into both nostrils daily. (Patient taking differently: Place 2 sprays into both nostrils daily as needed for allergies.) 9.9 mL 2  ? gabapentin (NEURONTIN) 300 MG capsule TAKE 2 CAPSULES BY MOUTH AT BEDTIME 60 capsule 5  ? HYDROcodone-acetaminophen (NORCO/VICODIN) 5-325 MG tablet Take 1-2 tablets by mouth every 8 (eight) hours as needed for moderate pain (pain score 4-6). 30  tablet 0  ? magnesium oxide (MAG-OX) 400 (241.3 Mg) MG tablet Take 2 tablets (800 mg total) by mouth 2 (two) times daily. (Patient taking differently: Take 400 mg by mouth daily.) 120 tablet 0  ? methocarbamol (ROBAXIN) 500 MG tablet Take 1 tablet (500 mg total) by mouth every 6 (six) hours as needed for muscle spasms. 60 tablet 0  ? ondansetron (ZOFRAN) 4 MG tablet TAKE 1 TABLET BY MOUTH 3 TIMES DAILY (Patient taking differently: Take 4 mg by mouth every 8 (eight) hours as needed for nausea or vomiting.) 120 tablet 5  ? potassium chloride SA (KLOR-CON) 20 MEQ tablet Take 1 tablet (20 mEq total) by mouth 2 (two) times daily. (Patient taking differently: Take 20 mEq by mouth daily.) 60 tablet 0  ? spironolactone (ALDACTONE) 25 MG tablet Take 25 mg by mouth daily.    ? TRINTELLIX 20 MG TABS tablet TAKE 1 TABLET BY MOUTH ONCE DAILY 30 tablet 0  ? ?No current facility-administered medications for this visit.  ? ? ?Patient confirms/reports the following allergies:  ?Allergies  ?Allergen Reactions  ? Azithromycin Diarrhea and Nausea And Vomiting  ? Benzalkonium Chloride Other (See Comments)  ?  Pt unsure of allergy   ? Augmentin [Amoxicillin-Pot Clavulanate] Nausea Only  ? Ciprofloxacin Nausea Only  ? Neomycin Rash  ? Neosporin [Bacitracin-Polymyxin B] Itching and Rash  ? Neosporin [Neomycin-Bacitracin Zn-Polymyx] Rash  ? Septra [Sulfamethoxazole-Trimethoprim] Nausea Only  ? ? ?No orders of the defined types were placed in this encounter. ? ? ?AUTHORIZATION INFORMATION ?Primary Insurance: ?1D#: ?Group #: ? ?Secondary Insurance: ?1D#: ?  Group #: ? ?SCHEDULE INFORMATION: ?Date: 03/25/2022 ?Time: ?Location: ?[facility] ?

## 2022-03-08 DIAGNOSIS — E038 Other specified hypothyroidism: Secondary | ICD-10-CM | POA: Diagnosis not present

## 2022-03-08 DIAGNOSIS — I351 Nonrheumatic aortic (valve) insufficiency: Secondary | ICD-10-CM | POA: Diagnosis not present

## 2022-03-08 DIAGNOSIS — E785 Hyperlipidemia, unspecified: Secondary | ICD-10-CM | POA: Diagnosis not present

## 2022-03-08 DIAGNOSIS — I340 Nonrheumatic mitral (valve) insufficiency: Secondary | ICD-10-CM | POA: Diagnosis not present

## 2022-03-08 DIAGNOSIS — I509 Heart failure, unspecified: Secondary | ICD-10-CM | POA: Diagnosis not present

## 2022-03-08 DIAGNOSIS — R9431 Abnormal electrocardiogram [ECG] [EKG]: Secondary | ICD-10-CM | POA: Diagnosis not present

## 2022-03-08 DIAGNOSIS — I10 Essential (primary) hypertension: Secondary | ICD-10-CM | POA: Diagnosis not present

## 2022-03-11 ENCOUNTER — Other Ambulatory Visit: Payer: Self-pay | Admitting: Nurse Practitioner

## 2022-03-11 ENCOUNTER — Ambulatory Visit: Payer: Medicaid Other

## 2022-03-11 DIAGNOSIS — N63 Unspecified lump in unspecified breast: Secondary | ICD-10-CM

## 2022-03-13 ENCOUNTER — Other Ambulatory Visit: Payer: Self-pay | Admitting: Podiatry

## 2022-03-13 ENCOUNTER — Other Ambulatory Visit: Payer: Self-pay

## 2022-03-13 ENCOUNTER — Ambulatory Visit
Admission: RE | Admit: 2022-03-13 | Discharge: 2022-03-13 | Disposition: A | Discharge status: Home / Self Care | Payer: Medicaid Other | Source: Ambulatory Visit | Attending: Orthopedic Surgery | Admitting: Orthopedic Surgery

## 2022-03-13 DIAGNOSIS — I999 Unspecified disorder of circulatory system: Secondary | ICD-10-CM

## 2022-03-13 DIAGNOSIS — M19071 Primary osteoarthritis, right ankle and foot: Secondary | ICD-10-CM

## 2022-03-13 MED ORDER — METHYLPREDNISOLONE ACETATE 40 MG/ML INJ SUSP (RADIOLOG
80.0000 mg | Freq: Once | INTRAMUSCULAR | Status: AC
  Administered 2022-03-13: 80 mg via INTRA_ARTICULAR

## 2022-03-13 MED ORDER — IOPAMIDOL (ISOVUE-M 200) INJECTION 41%
1.0000 mL | Freq: Once | INTRAMUSCULAR | Status: AC
  Administered 2022-03-13: 1 mL via INTRA_ARTICULAR

## 2022-03-13 NOTE — Progress Notes (Signed)
Sent message, via epic in basket, requesting orders in epic from surgeon.  

## 2022-03-14 ENCOUNTER — Ambulatory Visit: Payer: Medicaid Other

## 2022-03-14 ENCOUNTER — Ambulatory Visit: Payer: Self-pay | Admitting: Surgery

## 2022-03-14 DIAGNOSIS — G8929 Other chronic pain: Secondary | ICD-10-CM

## 2022-03-14 DIAGNOSIS — R269 Unspecified abnormalities of gait and mobility: Secondary | ICD-10-CM

## 2022-03-14 DIAGNOSIS — R2689 Other abnormalities of gait and mobility: Secondary | ICD-10-CM

## 2022-03-14 DIAGNOSIS — R2681 Unsteadiness on feet: Secondary | ICD-10-CM

## 2022-03-14 DIAGNOSIS — M6281 Muscle weakness (generalized): Secondary | ICD-10-CM

## 2022-03-14 DIAGNOSIS — R262 Difficulty in walking, not elsewhere classified: Secondary | ICD-10-CM

## 2022-03-14 DIAGNOSIS — Z01818 Encounter for other preprocedural examination: Secondary | ICD-10-CM
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
## 2022-03-18 ENCOUNTER — Ambulatory Visit: Payer: Medicaid Other

## 2022-03-20 ENCOUNTER — Ambulatory Visit: Payer: Medicaid Other

## 2022-03-20 DIAGNOSIS — I10 Essential (primary) hypertension: Secondary | ICD-10-CM | POA: Diagnosis not present

## 2022-03-20 DIAGNOSIS — I351 Nonrheumatic aortic (valve) insufficiency: Secondary | ICD-10-CM | POA: Diagnosis not present

## 2022-03-20 DIAGNOSIS — I340 Nonrheumatic mitral (valve) insufficiency: Secondary | ICD-10-CM | POA: Diagnosis not present

## 2022-03-20 DIAGNOSIS — I509 Heart failure, unspecified: Secondary | ICD-10-CM | POA: Diagnosis not present

## 2022-03-20 DIAGNOSIS — E785 Hyperlipidemia, unspecified: Secondary | ICD-10-CM | POA: Diagnosis not present

## 2022-03-20 NOTE — Patient Instructions (Signed)
DUE TO COVID-19 ONLY ONE VISITOR  (aged 54 and older)  IS ALLOWED TO COME WITH YOU AND STAY IN THE WAITING ROOM ONLY DURING PRE OP AND PROCEDURE.   ?**NO VISITORS ARE ALLOWED IN THE SHORT STAY AREA OR RECOVERY ROOM!!** ? ?IF YOU WILL BE ADMITTED INTO THE HOSPITAL YOU ARE ALLOWED ONLY TWO SUPPORT PEOPLE DURING VISITATION HOURS ONLY (7 AM -8PM)   ?The support person(s) must pass our screening, gel in and out, and wear a mask at all times, including in the patient?s room. ?Patients must also wear a mask when staff or their support person are in the room. ?Visitors GUEST BADGE MUST BE WORN VISIBLY  ?One adult visitor may remain with you overnight and MUST be in the room by 8 P.M. ?  ? ? Your procedure is scheduled on: 03/27/22 ? ? Report to [facility] Main Entrance ? ?  Report to admitting at : 6:45 AM ? ? Call this number if you have problems the morning of surgery [phone] ? ? Do not eat food :After Midnight. ? ? CLEAR LIQUIDS STARTING THE DAY BEFORE SURGERY until : 6:00 AM DAY OF SURGERY.DRINK PLENTY FLUIDS WHEN DOING THE PREP. ? ?Water ?Black Coffee (sugar ok, NO MILK/CREAM OR CREAMERS)  ?Tea (sugar ok, NO MILK/CREAM OR CREAMERS) regular and decaf                             ?Plain Jell-O (NO RED)                                           ?Fruit ices (not with fruit pulp, NO RED)                                     ?Popsicles (NO RED)                                                                  ?Juice: apple, WHITE grape, WHITE cranberry ?Sports drinks like Gatorade (NO RED) ?Clear broth(vegetable,chicken,beef) ?            ?DRINK 2 PRESURGERY ENSURE DRINKS THE NIGHT BEFORE SURGERY AT ? 1000 PM AND 1 PRESURGERY DRINK THE DAY OF THE PROCEDURE 3 HOURS PRIOR TO SCHEDULED SURGERY. NO SOLIDS AFTER MIDNIGHT THE DAY PRIOR TO THE SURGERY. NOTHING BY MOUTH EXCEPT CLEAR LIQUIDS UNTIL THREE HOURS PRIOR TO SCHEDULED SURGERY. PLEASE FINISH PRESURGERY ENSURE DRINK PER SURGEON ORDER 3 HOURS PRIOR TO SCHEDULED  SURGERY TIME WHICH NEEDS TO BE COMPLETED AT : 6:00 AM.   ? ? ?FOLLOW BOWEL PREP AND ANY ADDITIONAL PRE OP INSTRUCTIONS YOU RECEIVED FROM YOUR SURGEON'S OFFICE!!! ?  ?  Oral Hygiene is also important to reduce your risk of infection.                                    ?Remember - BRUSH YOUR TEETH THE MORNING OF SURGERY WITH YOUR REGULAR TOOTHPASTE ? ? Do  NOT smoke after Midnight ? ? Take these medicines the morning of surgery with A SIP OF WATER: trintellix.Tylenol as needed.Use flonase and inhalers as usual. ? ?DO NOT TAKE ANY ORAL DIABETIC MEDICATIONS DAY OF YOUR SURGERY ? ?Bring CPAP mask and tubing day of surgery. ?                  ?           You may not have any metal on your body including hair pins, jewelry, and body piercing ? ?           Do not wear make-up, lotions, powders, perfumes/cologne, or deodorant ? ?Do not wear nail polish including gel and S&S, artificial/acrylic nails, or any other type of covering on natural nails including finger and toenails. If you have artificial nails, gel coating, etc. that needs to be removed by a nail salon please have this removed prior to surgery or surgery may need to be canceled/ delayed if the surgeon/ anesthesia feels like they are unable to be safely monitored.  ? ?Do not shave  48 hours prior to surgery.  ? ? Do not bring valuables to the hospital. [place] NOT ?            RESPONSIBLE   FOR VALUABLES. ? ? Contacts, dentures or bridgework may not be worn into surgery. ? ? Bring small overnight bag day of surgery. ?  ? Patients discharged on the day of surgery will not be allowed to drive home.  Someone NEEDS to stay with you for the first 24 hours after anesthesia. ? ? Special Instructions: Bring a copy of your healthcare power of attorney and living will documents         the day of surgery if you haven't scanned them before. ? ?            Please read over the following fact sheets you were given: [facility] [place] [phone] ? ?   [place] - Preparing for Surgery ?Before surgery, you can play an important role.  Because skin is not sterile, your skin needs to be as free of germs as possible.  You can reduce the number of germs on your skin by washing with CHG (chlorahexidine gluconate) soap before surgery.  CHG is an antiseptic cleaner which kills germs and bonds with the skin to continue killing germs even after washing. ?Please DO NOT use if you have an allergy to CHG or antibacterial soaps.  If your skin becomes reddened/irritated stop using the CHG and inform your nurse when you arrive at Short Stay. ?Do not shave (including legs and underarms) for at least 48 hours prior to the first CHG shower.  You may shave your face/neck. ?Please follow these instructions carefully: ? 1.  Shower with CHG Soap the night before surgery and the  morning of Surgery. ? 2.  If you choose to wash your hair, wash your hair first as usual with your  normal  shampoo. ? 3.  After you shampoo, rinse your hair and body thoroughly to remove the  shampoo.                           4.  Use CHG as you would any other liquid soap.  You can apply chg directly  to the skin and wash  ?  Gently with a scrungie or clean washcloth. ? 5.  Apply the CHG Soap to your body ONLY FROM THE NECK DOWN.   Do not use on face/ open      ?                     Wound or open sores. Avoid contact with eyes, ears mouth and genitals (private parts).  ?                     Production manager,  Genitals (private parts) with your normal soap. ?            6.  Wash thoroughly, paying special attention to the area where your surgery  will be performed. ? 7.  Thoroughly rinse your body with warm water from the neck down. ? 8.  DO NOT shower/wash with your normal soap after using and rinsing off  the CHG Soap. ?               9.  Pat yourself dry with a clean towel. ?           10.  Wear clean pajamas. ?           11.  Place clean sheets on your bed the  night of your first shower and do not  sleep with pets. ?Day of Surgery : ?Do not apply any lotions/deodorants the morning of surgery.  Please wear clean clothes to the hospital/surgery center. ? ?FAILURE TO FOLLOW THESE INSTRUCTIONS MAY RESULT IN THE CANCELLATION OF YOUR SURGERY ?PATIENT SIGNATURE_________________________________ ? ?NURSE SIGNATURE__________________________________ ? ?________________________________________________________________________  ? ?Incentive Spirometer ? ?An incentive spirometer is a tool that can help keep your lungs clear and active. This tool measures how well you are filling your lungs with each breath. Taking long deep breaths may help reverse or decrease the chance of developing breathing (pulmonary) problems (especially infection) following: ?A long period of time when you are unable to move or be active. ?BEFORE THE PROCEDURE  ?If the spirometer includes an indicator to show your best effort, your nurse or respiratory therapist will set it to a desired goal. ?If possible, sit up straight or lean slightly forward. Try not to slouch. ?Hold the incentive spirometer in an upright position. ?INSTRUCTIONS FOR USE  ?Sit on the edge of your bed if possible, or sit up as far as you can in bed or on a chair. ?Hold the incentive spirometer in an upright position. ?Breathe out normally. ?Place the mouthpiece in your mouth and seal your lips tightly around it. ?Breathe in slowly and as deeply as possible, raising the piston or the ball toward the top of the column. ?Hold your breath for 3-5 seconds or for as long as possible. Allow the piston or ball to fall to the bottom of the column. ?Remove the mouthpiece from your mouth and breathe out normally. ?Rest for a few seconds and repeat Steps 1 through 7 at least 10 times every 1-2 hours when you are awake. Take your time and take a few normal breaths between deep breaths. ?The spirometer may include an indicator to show your best effort. Use  the indicator as a goal to work toward during each repetition. ?After each set of 10 deep breaths, practice coughing to be sure your lungs are clear. If you have an incision (the cut made at the time of surge

## 2022-03-21 ENCOUNTER — Encounter (HOSPITAL_COMMUNITY)
Admission: RE | Admit: 2022-03-21 | Discharge: 2022-03-21 | Disposition: A | Discharge status: Home / Self Care | Payer: Medicaid Other | Source: Ambulatory Visit | Attending: Surgery | Admitting: Surgery

## 2022-03-21 ENCOUNTER — Other Ambulatory Visit: Payer: Self-pay

## 2022-03-21 ENCOUNTER — Encounter (HOSPITAL_COMMUNITY): Payer: Self-pay

## 2022-03-21 VITALS — BP 136/71 | HR 74 | Temp 98.5°F | Resp 16 | Ht 65.0 in | Wt 227.0 lb

## 2022-03-21 DIAGNOSIS — I2510 Atherosclerotic heart disease of native coronary artery without angina pectoris: Secondary | ICD-10-CM | POA: Insufficient documentation

## 2022-03-21 DIAGNOSIS — I10 Essential (primary) hypertension: Secondary | ICD-10-CM | POA: Diagnosis not present

## 2022-03-21 DIAGNOSIS — I351 Nonrheumatic aortic (valve) insufficiency: Secondary | ICD-10-CM | POA: Diagnosis not present

## 2022-03-21 DIAGNOSIS — Z01812 Encounter for preprocedural laboratory examination: Secondary | ICD-10-CM | POA: Diagnosis not present

## 2022-03-21 DIAGNOSIS — Z01818 Encounter for other preprocedural examination: Secondary | ICD-10-CM

## 2022-03-21 DIAGNOSIS — I340 Nonrheumatic mitral (valve) insufficiency: Secondary | ICD-10-CM | POA: Diagnosis not present

## 2022-03-21 DIAGNOSIS — I509 Heart failure, unspecified: Secondary | ICD-10-CM | POA: Diagnosis not present

## 2022-03-21 DIAGNOSIS — E785 Hyperlipidemia, unspecified: Secondary | ICD-10-CM | POA: Diagnosis not present

## 2022-03-21 HISTORY — DX: Cardiac murmur, unspecified: R01.1

## 2022-03-21 HISTORY — DX: Cardiac arrhythmia, unspecified: I49.9

## 2022-03-21 LAB — CBC
HCT: 44.6 % (ref 36.0–46.0)
Hemoglobin: 14.2 g/dL (ref 12.0–15.0)
MCH: 30.2 pg (ref 26.0–34.0)
MCHC: 31.8 g/dL (ref 30.0–36.0)
MCV: 94.9 fL (ref 80.0–100.0)
Platelets: 268 10*3/uL (ref 150–400)
RBC: 4.7 MIL/uL (ref 3.87–5.11)
RDW: 13.2 % (ref 11.5–15.5)
WBC: 8.9 10*3/uL (ref 4.0–10.5)
nRBC: 0 % (ref 0.0–0.2)

## 2022-03-21 LAB — BASIC METABOLIC PANEL
Anion gap: 10 (ref 5–15)
BUN: 14 mg/dL (ref 6–20)
CO2: 19 mmol/L — ABNORMAL LOW (ref 22–32)
Calcium: 9.3 mg/dL (ref 8.9–10.3)
Chloride: 108 mmol/L (ref 98–111)
Creatinine, Ser: 0.89 mg/dL (ref 0.44–1.00)
GFR, Estimated: >60 mL/min (ref >60)
Glucose, Bld: 87 mg/dL (ref 70–99)
Potassium: 3.3 mmol/L — ABNORMAL LOW (ref 3.5–5.1)
Sodium: 137 mmol/L (ref 135–145)

## 2022-03-21 LAB — HEMOGLOBIN A1C
Hgb A1c MFr Bld: 4.9 % (ref 4.8–5.6)
Mean Plasma Glucose: 93.93 mg/dL

## 2022-03-21 NOTE — Progress Notes (Signed)
For Short Stay: ?[place] appointment date: N/A ?Date of COVID positive in last 90 days: N/A ?COVID Vaccine: NO ?Bowel Prep reminder: Reviewed. ? ? ?For Anesthesia: ?PCP - [facility] ?Cardiologist - Dr. Chrissie Noa in [facility]. Pt. Will ask to fax records to [facility] ? ?Chest x-ray -  ?EKG - 01/22/22 ?Stress Test -  ?ECHO - 09/24/20 ?Cardiac Cath -  ?Pacemaker/ICD device last checked: ?Pacemaker orders received: ?Device Rep notified: ? ?Spinal Cord Stimulator: ? ?Sleep Study -  ?CPAP -  ? ?Fasting Blood Sugar -  ?Checks Blood Sugar _____ times a day ?Date and result of last Hgb A1c- ? ?Blood Thinner Instructions: ?Aspirin Instructions: ?Last Dose: ? ?Activity level: Can go up a flight of stairs and activities of daily living without stopping and without chest pain and/or shortness of breath ?  Able to exercise without chest pain and/or shortness of breath ?  Unable to go up a flight of stairs without chest pain and/or shortness of breath ?   ? ?Anesthesia review: Hx: Heart murmur,irregular heart beat. ? ?Patient denies shortness of breath, fever, cough and chest pain at PAT appointment ? ? ?Patient verbalized understanding of instructions that were given to them at the PAT appointment. Patient was also instructed that they will need to review over the PAT instructions again at home before surgery.  ?

## 2022-03-22 ENCOUNTER — Ambulatory Visit (INDEPENDENT_AMBULATORY_CARE_PROVIDER_SITE_OTHER): Payer: Medicaid Other

## 2022-03-22 DIAGNOSIS — I999 Unspecified disorder of circulatory system: Secondary | ICD-10-CM | POA: Diagnosis not present

## 2022-03-22 DIAGNOSIS — M79652 Pain in left thigh: Secondary | ICD-10-CM | POA: Diagnosis not present

## 2022-03-22 DIAGNOSIS — M79651 Pain in right thigh: Secondary | ICD-10-CM | POA: Diagnosis not present

## 2022-03-25 ENCOUNTER — Ambulatory Visit: Payer: Medicaid Other

## 2022-03-25 ENCOUNTER — Encounter: Payer: Self-pay | Admitting: Gastroenterology

## 2022-03-25 ENCOUNTER — Ambulatory Visit: Payer: Medicaid Other | Admitting: Physician Assistant

## 2022-03-25 ENCOUNTER — Ambulatory Visit: Payer: Medicaid Other | Admitting: Certified Registered Nurse Anesthetist

## 2022-03-25 ENCOUNTER — Ambulatory Visit
Admission: RE | Admit: 2022-03-25 | Discharge: 2022-03-25 | Disposition: A | Discharge status: Home / Self Care | Payer: Medicaid Other | Attending: Gastroenterology | Admitting: Gastroenterology

## 2022-03-25 ENCOUNTER — Encounter
Admission: RE | Disposition: A | Discharge status: Home / Self Care | Payer: Self-pay | Source: Home / Self Care | Attending: Gastroenterology

## 2022-03-25 DIAGNOSIS — Z01818 Encounter for other preprocedural examination: Secondary | ICD-10-CM | POA: Diagnosis not present

## 2022-03-25 DIAGNOSIS — Z1211 Encounter for screening for malignant neoplasm of colon: Secondary | ICD-10-CM | POA: Insufficient documentation

## 2022-03-25 DIAGNOSIS — Z87891 Personal history of nicotine dependence: Secondary | ICD-10-CM | POA: Insufficient documentation

## 2022-03-25 DIAGNOSIS — K5641 Fecal impaction: Secondary | ICD-10-CM | POA: Insufficient documentation

## 2022-03-25 DIAGNOSIS — E079 Disorder of thyroid, unspecified: Secondary | ICD-10-CM | POA: Insufficient documentation

## 2022-03-25 DIAGNOSIS — Z980 Intestinal bypass and anastomosis status: Secondary | ICD-10-CM | POA: Insufficient documentation

## 2022-03-25 DIAGNOSIS — Z933 Colostomy status: Secondary | ICD-10-CM | POA: Diagnosis not present

## 2022-03-25 DIAGNOSIS — Z713 Dietary counseling and surveillance: Secondary | ICD-10-CM

## 2022-03-25 DIAGNOSIS — F32A Depression, unspecified: Secondary | ICD-10-CM | POA: Diagnosis not present

## 2022-03-25 DIAGNOSIS — F909 Attention-deficit hyperactivity disorder, unspecified type: Secondary | ICD-10-CM | POA: Insufficient documentation

## 2022-03-25 DIAGNOSIS — K9189 Other postprocedural complications and disorders of digestive system: Secondary | ICD-10-CM | POA: Diagnosis not present

## 2022-03-25 HISTORY — PX: COLONOSCOPY WITH PROPOFOL: SHX5780

## 2022-03-25 LAB — POCT PREGNANCY, URINE: Preg Test, Ur: NEGATIVE

## 2022-03-25 SURGERY — COLONOSCOPY WITH PROPOFOL
Anesthesia: General

## 2022-03-25 MED ORDER — EPHEDRINE SULFATE (PRESSORS) 50 MG/ML IJ SOLN
INTRAMUSCULAR | Status: DC | PRN
  Administered 2022-03-25: 5 mg via INTRAVENOUS

## 2022-03-25 MED ORDER — EPHEDRINE 5 MG/ML INJ
INTRAVENOUS | Status: AC
  Filled 2022-03-25: qty 5

## 2022-03-25 MED ORDER — LIDOCAINE HCL (CARDIAC) PF 100 MG/5ML IV SOSY
PREFILLED_SYRINGE | INTRAVENOUS | Status: DC | PRN
  Administered 2022-03-25: 50 mg via INTRAVENOUS

## 2022-03-25 MED ORDER — SODIUM CHLORIDE 0.9 % IV SOLN: INTRAVENOUS | Status: DC

## 2022-03-25 MED ORDER — PROPOFOL 500 MG/50ML IV EMUL
INTRAVENOUS | Status: DC | PRN
  Administered 2022-03-25: 150 ug/kg/min via INTRAVENOUS

## 2022-03-25 MED ORDER — PROPOFOL 500 MG/50ML IV EMUL
INTRAVENOUS | Status: AC
  Filled 2022-03-25: qty 50

## 2022-03-25 MED ORDER — PROPOFOL 10 MG/ML IV BOLUS
INTRAVENOUS | Status: DC | PRN
  Administered 2022-03-25: 80 mg via INTRAVENOUS

## 2022-03-25 NOTE — H&P (Signed)
?Arlyss Repress, MD ?[address]  ?[address]  ?[place], [place] [address]  ?Main: [phone]  ?Fax: [phone] ?Pager: [phone] ? ?Primary Care Physician:  Fayrene Helper, NP ?Primary Gastroenterologist:  Dr. Arlyss Repress ? ?Pre-Procedure History & Physical: ?HPI:  Sara [name] is a 56 y.o. female is here for an colonoscopy. ?  ?Past Medical History:  ?Diagnosis Date  ? ADHD   ? 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 Depression   ? Dyspnea   ? Dysrhythmia   ? Heart murmur   ? Plantar fasciitis   ? bilateral feet  ? Thyroid disease   ? ? ?Past Surgical History:  ?Procedure Laterality Date  ? COLON SURGERY    ? I & D EXTREMITY Left 11/06/2020  ? Procedure: IRRIGATION AND DEBRIDEMENT EXTREMITY;  Surgeon: Myrene Galas, MD;  Location: [facility] OR;  Service: Orthopedics;  Laterality: Left;  ? IR CATHETER TUBE CHANGE  11/15/2020  ? IRRIGATION AND DEBRIDEMENT KNEE  09/22/2020  ? Procedure: IRRIGATION AND DEBRIDEMENT LEFT KNEE PLACEMENT OF EXTERNAL FIXATION,;  Surgeon: Yolonda Kida, MD;  Location: [facility] OR;  Service: Orthopedics;;  ? LAPAROTOMY N/A 09/24/2020  ? Procedure: EXPLORATORY LAPAROTOMY COLOSTOMY  AND REPAIR  FLANK HERNIA;  Surgeon: Berna Bue, MD;  Location: [facility] OR;  Service: General;  Laterality: N/A;  ? LAPAROTOMY N/A 09/22/2020  ? Procedure: EXPLORATORY LAPAROTOMY, Ileocecectomy;  Surgeon: Berna Bue, MD;  Location: [facility] OR;  Service: General;  Laterality: N/A;  ? OPEN REDUCTION INTERNAL FIXATION (ORIF) DISTAL RADIAL FRACTURE Right 09/26/2020  ? Procedure: OPEN REDUCTION INTERNAL FIXATION (ORIF) DISTAL RADIUS FRACTURE;  Surgeon: Myrene Galas, MD;  Location: [facility] OR;  Service: Orthopedics;  Laterality: Right;  ? ORIF FEMUR FRACTURE Left 11/02/2020  ? Procedure: INCISION DRAINAGE DEEP WOUND LEFT LEG, APPLICATION OF WOUND VAC;  Surgeon: Myrene Galas, MD;  Location: [facility] OR;  Service: Orthopedics;  Laterality: Left;  ? ORIF FEMUR FRACTURE Left 12/07/2020  ? Procedure: OPEN REDUCTION  INTERNAL FIXATION (ORIF) DISTAL FEMUR FRACTURE : REPAIR OF LEFT DISTAL FEMUR NONUNION;  Surgeon: Myrene Galas, MD;  Location: [facility] OR;  Service: Orthopedics;  Laterality: Left;  ? ORIF FEMUR FRACTURE Left 01/24/2022  ? Procedure: REPAIR LEFT FEMUR NONUNION, QUADRECEPLASTY;  Surgeon: Myrene Galas, MD;  Location: [facility] OR;  Service: Orthopedics;  Laterality: Left;  ? ORIF TIBIA PLATEAU Bilateral 09/26/2020  ? Procedure: OPEN REDUCTION INTERNAL FIXATION (ORIF) RIGHT DISTAL FEMUR, RIGHT CALCANEUS, LEFT DISTAL FEMUR, LEFT TIBIAL PLATEAU FRACTURE.  IRRIGATION AND DEBRIDEMENT LEFT LEG; REMOVAL OF EXTERNAL FIXATOR LEFT LEG;  Surgeon: Myrene Galas, MD;  Location: [facility] OR;  Service: Orthopedics;  Laterality: Bilateral;  ? ? ?Prior to Admission medications   ?Medication Sig Start Date End Date Taking? Authorizing Provider  ?acetaminophen (TYLENOL) 500 MG tablet Take 1 tablet (500 mg total) by mouth every 8 (eight) hours as needed for moderate pain or mild pain. 01/25/22   Montez Morita, PA-C  ?albuterol (VENTOLIN HFA) 108 (90 Base) MCG/ACT inhaler Inhale 2 puffs into the lungs every 6 (six) hours as needed for wheezing or shortness of breath.    [provider]  ?diclofenac Sodium (VOLTAREN) 1 % GEL Apply 1 application. topically daily as needed (pain).    [provider]  ?fluticasone (FLONASE) 50 MCG/ACT nasal spray Place 1 spray into both nostrils daily as needed for allergies or rhinitis.    [provider]  ?HYDROcodone-acetaminophen (NORCO/VICODIN) 5-325 MG tablet Take 1 tablet by mouth every 6 (six) hours as  needed for moderate pain.    [provider]  ?magnesium oxide (MAG-OX) 400 (241.3 Mg) MG tablet Take 2 tablets (800 mg total) by mouth 2 (two) times daily. ?Patient taking differently: Take 400 mg by mouth daily. 01/17/21   Love, Evlyn Kanner, PA-C  ?methocarbamol (ROBAXIN) 500 MG tablet Take 1 tablet (500 mg total) by mouth every 6 (six) hours as needed for muscle spasms. 01/25/22   Montez Morita, PA-C  ?ondansetron (ZOFRAN) 4 MG tablet TAKE 1 TABLET BY MOUTH 3 TIMES DAILY ?Patient taking differently: Take 4 mg by mouth 2 (two) times daily. 02/13/21   Lovorn, Aundra Millet, MD  ?potassium chloride SA (KLOR-CON) 20 MEQ tablet Take 1 tablet (20 mEq total) by mouth 2 (two) times daily. ?Patient taking differently: Take 20 mEq by mouth daily. 01/17/21   Love, Evlyn Kanner, PA-C  ?spironolactone (ALDACTONE) 25 MG tablet Take 12.5 mg by mouth daily. 05/24/21   [provider]  ?triamcinolone cream (KENALOG) 0.1 % Apply 1 application. topically 2 (two) times daily. 03/20/22   [provider]  ?TRINTELLIX 20 MG TABS tablet TAKE 1 TABLET BY MOUTH ONCE DAILY 02/13/21   Genice Rouge, MD  ? ? ?Allergies as of 03/07/2022 - Review Complete 03/07/2022  ?Allergen Reaction Noted  ? Azithromycin Diarrhea and Nausea And Vomiting 11/12/2016  ? Benzalkonium chloride Other (See Comments) 08/02/2021  ? Augmentin [amoxicillin-pot clavulanate] Nausea Only 12/05/2020  ? Ciprofloxacin Nausea Only 12/05/2020  ? Neomycin Rash 08/02/2021  ? Neosporin [bacitracin-polymyxin b] Itching and Rash 09/24/2020  ? Neosporin [neomycin-bacitracin zn-polymyx] Rash 01/03/2017  ? Septra [sulfamethoxazole-trimethoprim] Nausea Only 11/12/2016  ? ? ?Family History  ?Problem Relation Age of Onset  ? Breast cancer Maternal Aunt   ? ? ?Social History  ? ?Socioeconomic History  ? Marital status: Single  ?  Spouse name: Not on file  ? Number of children: Not on file  ? Years of education: Not on file  ? Highest education level: Not on file  ?Occupational History  ? Not on file  ?Tobacco Use  ? Smoking status: Former  ?  Types: Cigarettes  ? Smokeless tobacco: Former  ?Vaping Use  ? Vaping Use: Some days  ? Substances: CBD  ?Substance and Sexual Activity  ? Alcohol use: Yes  ?  Comment: occas.  ? Drug use: No  ? Sexual activity: Not Currently  ?  Birth control/protection: I.U.D.  ?Other Topics Concern  ? Not on file  ?Social History Narrative  ? **  Merged History Encounter **  ?    ? ?Social Determinants of Health  ? ?Financial Resource Strain: Not on file  ?Food Insecurity: Not on file  ?Transportation Needs: Not on file  ?Physical Activity: Not on file  ?Stress: Not on file  ?Social Connections: Not on file  ?Intimate Partner Violence: Not on file  ? ? ?Review of Systems: ?See HPI, otherwise negative ROS ? ?Physical Exam: ?BP 136/63   Pulse (!) 55   Temp (!) 96.7 ?F (35.9 ?C) (Temporal)   Resp 16   Ht 5\' 5"  (1.651 m)   Wt 102.1 kg   SpO2 100%   BMI 37.44 kg/m?  ?General:   Alert,  pleasant and cooperative in NAD ?Head:  Normocephalic and atraumatic. ?Neck:  Supple; no masses or thyromegaly. ?Lungs:  Clear throughout to auscultation.    ?Heart:  Regular rate and rhythm. ?Abdomen:  Soft, nontender and nondistended. Normal bowel sounds, without guarding, and without rebound.   ?Neurologic:  Alert and  oriented x4;  grossly normal neurologically. ? ?Impression/Plan: ?Sara [name] is here for an colonoscopy to be performed for colon cancer screening ? ?Risks, benefits, limitations, and alternatives regarding  colonoscopy have been reviewed with the patient.  Questions have been answered.  All parties agreeable. ? ? ?Lannette Donathohini Neizan Debruhl, MD  03/25/2022, 8:40 AM ?

## 2022-03-25 NOTE — OR Nursing (Signed)
Colonoscope inserted through the bag and the stoma at 0904; Surgical anastomosis reached at 0907; Scope withdrew from the stoma at 0913; Scope inserted through the anus at 0915; Scope withdrew from the anus at 0920. ?

## 2022-03-25 NOTE — Transfer of Care (Signed)
Immediate Anesthesia Transfer of Care Note ? ?Patient: Sara [name] ? ?Procedure(s) Performed: COLONOSCOPY WITH PROPOFOL ? ?Patient Location: PACU ? ?Anesthesia Type:General ? ?Level of Consciousness: sedated ? ?Airway & Oxygen Therapy: Patient Spontanous Breathing ? ?Post-op Assessment: Report given to RN and Post -op Vital signs reviewed and stable ? ?Post vital signs: Reviewed and stable ? ?Last Vitals:  ?Vitals Value Taken Time  ?BP 102/52 03/25/22 0928  ?Temp 36.4 ?C 03/25/22 0926  ?Pulse 52 03/25/22 0928  ?Resp 12 03/25/22 0928  ?SpO2 98 % 03/25/22 0928  ? ? ?Last Pain:  ?Vitals:  ? 03/25/22 0926  ?TempSrc: Temporal  ?PainSc: Asleep  ?   ? ?  ? ?Complications: No notable events documented. ?

## 2022-03-25 NOTE — Anesthesia Preprocedure Evaluation (Signed)
Anesthesia Evaluation  ?Patient identified by MRN, date of birth, ID band ?Patient awake ? ? ? ?Reviewed: ?Allergy & Precautions, H&P , NPO status , Patient's Chart, lab work & pertinent test results, reviewed documented beta blocker date and time  ? ?History of Anesthesia Complications ?Negative for: history of anesthetic complications ? ?Airway ?Mallampati: II ? ?TM Distance: >3 FB ?Neck ROM: full ? ? ? Dental ? ?(+) Dental Advidsory Given, Missing, Poor Dentition ?  ?Pulmonary ?shortness of breath and with exertion, neg sleep apnea, neg COPD, neg recent URI, former smoker,  ?  ?Pulmonary exam normal ?breath sounds clear to auscultation ? ? ? ? ? ? Cardiovascular ?Exercise Tolerance: Good ?(-) hypertension(-) angina(-) Past MI and (-) Cardiac Stents Normal cardiovascular exam+ dysrhythmias + Valvular Problems/Murmurs  ?Rhythm:regular Rate:Normal ? ? ?  ?Neuro/Psych ?PSYCHIATRIC DISORDERS Depression negative neurological ROS ?   ? GI/Hepatic ?negative GI ROS, Neg liver ROS,   ?Endo/Other  ?negative endocrine ROS ? Renal/GU ?negative Renal ROS  ?negative genitourinary ?  ?Musculoskeletal ? ? Abdominal ?  ?Peds ? Hematology ?negative hematology ROS ?(+)   ?Anesthesia Other Findings ?Past Medical History: ?No date: ADHD ?01/25/2022: Contracture of left knee ?No date: Depression ?No date: Dyspnea ?No date: Dysrhythmia ?No date: Heart murmur ?No date: Plantar fasciitis ?    Comment:  bilateral feet ?No date: Thyroid disease ? ? Reproductive/Obstetrics ?negative OB ROS ? ?  ? ? ? ? ? ? ? ? ? ? ? ? ? ?  ?  ? ? ? ? ? ? ? ? ?Anesthesia Physical ?Anesthesia Plan ? ?ASA: 2 ? ?Anesthesia Plan: General  ? ?Post-op Pain Management:   ? ?Induction: Intravenous ? ?PONV Risk Score and Plan: 3 and Propofol infusion and TIVA ? ?Airway Management Planned: Natural Airway and Nasal Cannula ? ?Additional Equipment:  ? ?Intra-op Plan:  ? ?Post-operative Plan:  ? ?Informed Consent: I have reviewed the  patients History and Physical, chart, labs and discussed the procedure including the risks, benefits and alternatives for the proposed anesthesia with the patient or authorized representative who has indicated his/her understanding and acceptance.  ? ? ? ?Dental Advisory Given ? ?Plan Discussed with: Anesthesiologist, CRNA and Surgeon ? ?Anesthesia Plan Comments:   ? ? ? ? ? ? ?Anesthesia Quick Evaluation ? ?

## 2022-03-25 NOTE — Op Note (Signed)
[facility] ?Gastroenterology ?Patient Name: Sara [name] ?Procedure Date: 03/25/2022 8:56 AM ?MRN: [id number] ?Account #: [id number] ?Date of Birth: [birth year] ?Admit Type: Outpatient ?Age: 56 ?Room: [facility] ENDO ROOM 1 ?Gender: Female ?Note Status: Finalized ?Instrument Name: Colonoscope [id number] ?Procedure:             Colonoscopy via Stoma with Endoscopy of Hartmann Pouch ?Indications:           Preoperative assessment for colostomy take-down,  ?                       Preoperative assessment for reversal of Hartmann  ?                       pouch, Screening for colorectal malignant neoplasm ?Providers:             Lin Landsman MD, MD ?Referring MD:          Evern Bio, FNP ?Medicines:             General Anesthesia ?Complications:         No immediate complications. Estimated blood loss: None. ?Procedure:             Pre-Anesthesia Assessment: ?                       - Prior to the procedure, a History and Physical was  ?                       performed, and patient medications and allergies were  ?                       reviewed. The patient is competent. The risks and  ?                       benefits of the procedure and the sedation options and  ?                       risks were discussed with the patient. All questions  ?                       were answered and informed consent was obtained.  ?                       Patient identification and proposed procedure were  ?                       verified by the physician, the nurse, the  ?                       anesthesiologist, the anesthetist and the technician  ?                       in the pre-procedure area in the procedure room in the  ?                       endoscopy suite. Mental Status Examination: alert and  ?                       oriented. Airway Examination: normal oropharyngeal  ?  airway and neck mobility. Respiratory Examination:  ?                       clear to auscultation. CV Examination:  normal.  ?                       Prophylactic Antibiotics: The patient does not require  ?                       prophylactic antibiotics. Prior Anticoagulants: The  ?                       patient has taken no previous anticoagulant or  ?                       antiplatelet agents. ASA Grade Assessment: II - A  ?                       patient with mild systemic disease. After reviewing  ?                       the risks and benefits, the patient was deemed in  ?                       satisfactory condition to undergo the procedure. The  ?                       anesthesia plan was to use general anesthesia.  ?                       Immediately prior to administration of medications,  ?                       the patient was re-assessed for adequacy to receive  ?                       sedatives. The heart rate, respiratory rate, oxygen  ?                       saturations, blood pressure, adequacy of pulmonary  ?                       ventilation, and response to care were monitored  ?                       throughout the procedure. The physical status of the  ?                       patient was re-assessed after the procedure. ?                       After obtaining informed consent, the endoscope was  ?                       passed under direct vision. Throughout the procedure,  ?                       the patient's blood pressure, pulse, and oxygen  ?  saturations were monitored continuously. The  ?                       Colonoscope was introduced through the sigmoid  ?                       colostomy and advanced to the the ileocolonic  ?                       anastomosis. The procedure was performed without  ?                       difficulty. The patient tolerated the procedure well.  ?                       The quality of the bowel preparation was good. ?Findings: ?     Patient is status-post partial colectomy with an end-to-side  ?     ileo-colonic anastomosis. ?     The colon (entire  examined portion) appeared normal. ?     Extensive amounts of solid stool was found in the [employer]. ?Impression:            - The entire examined colon is normal. ?                       - Stool in the [facility] pouch. ?                       - No specimens collected. ?Recommendation:        - Discharge patient to home (with escort). ?                       - Resume previous diet today. ?                       - Repeat post-surgical lower GI endoscopy in 6 months  ?                       for screening purposes. ?Procedure Code(s):     --- Professional --- ?                       [phone], Colonoscopy through stoma; diagnostic,  ?                       including collection of specimen(s) by brushing or  ?                       washing, when performed (separate procedure) ?Diagnosis Code(s):     --- Professional --- ?                       Z12.11, Encounter for screening for malignant neoplasm  ?                       of colon ?                       [id number], Encounter for other preprocedural examination ?  Z93.3, Colostomy status ?CPT copyright 2019 American Medical Association. All rights reserved. ?The codes documented in this report are preliminary and upon coder review may  ?be revised to meet current compliance requirements. ?Dr. Ulyess Mort ?Elzie Knisley Raeanne Gathers MD, MD ?03/25/2022 9:26:09 AM ?This report has been signed electronically. ?Number of Addenda: 0 ?Note Initiated On: 03/25/2022 8:56 AM ?Scope Withdrawal Time: 0 hours 12 minutes 20 seconds  ?Total Procedure Duration: 0 hours 15 minutes 52 seconds  ?Estimated Blood Loss:  Estimated blood loss: none. ?     [facility] ?

## 2022-03-25 NOTE — Anesthesia Postprocedure Evaluation (Signed)
Anesthesia Post Note ? ?Patient: Sara [name] ? ?Procedure(s) Performed: COLONOSCOPY WITH PROPOFOL ? ?Patient location during evaluation: Endoscopy ?Anesthesia Type: General ?Level of consciousness: awake and alert ?Pain management: pain level controlled ?Vital Signs Assessment: post-procedure vital signs reviewed and stable ?Respiratory status: spontaneous breathing, nonlabored ventilation, respiratory function stable and patient connected to nasal cannula oxygen ?Cardiovascular status: blood pressure returned to baseline and stable ?Postop Assessment: no apparent nausea or vomiting ?Anesthetic complications: no ? ? ?No notable events documented. ? ? ?Last Vitals:  ?Vitals:  ? 03/25/22 0936 03/25/22 0946  ?BP: 129/71 110/72  ?Pulse: (!) 54 (!) 55  ?Resp: 16 12  ?Temp:    ?SpO2: 100% 100%  ?  ?Last Pain:  ?Vitals:  ? 03/25/22 0946  ?TempSrc:   ?PainSc: 0-No pain  ? ? ?  ?  ?  ?  ?  ?  ? ?Martha Clan ? ? ? ? ?

## 2022-03-25 NOTE — Anesthesia Procedure Notes (Signed)
Date/Time: 03/25/2022 8:58 AM ?Performed by: Johnna Acosta, CRNA ?Pre-anesthesia Checklist: Patient identified, Emergency Drugs available, Suction available, Patient being monitored and Timeout performed ?Patient Re-evaluated:Patient Re-evaluated prior to induction ?Oxygen Delivery Method: Nasal cannula ?Preoxygenation: Pre-oxygenation with 100% oxygen ?Induction Type: IV induction ? ? ? ? ?

## 2022-03-26 ENCOUNTER — Encounter: Payer: Self-pay | Admitting: Gastroenterology

## 2022-03-26 ENCOUNTER — Ambulatory Visit: Payer: Medicaid Other

## 2022-03-27 ENCOUNTER — Inpatient Hospital Stay (HOSPITAL_COMMUNITY): Payer: Medicaid Other | Admitting: Certified Registered Nurse Anesthetist

## 2022-03-27 ENCOUNTER — Inpatient Hospital Stay (HOSPITAL_COMMUNITY)
Admission: RE | Admit: 2022-03-27 | Discharge: 2022-04-05 | DRG: 346 | Disposition: A | Discharge status: Home Health | Payer: Medicaid Other | Attending: Surgery | Admitting: Surgery

## 2022-03-27 ENCOUNTER — Other Ambulatory Visit: Payer: Self-pay

## 2022-03-27 ENCOUNTER — Encounter (HOSPITAL_COMMUNITY)
Admission: RE | Disposition: A | Discharge status: Home Health | Payer: Self-pay | Source: Home / Self Care | Attending: Surgery

## 2022-03-27 ENCOUNTER — Encounter (HOSPITAL_COMMUNITY): Payer: Self-pay | Admitting: Surgery

## 2022-03-27 DIAGNOSIS — F32A Depression, unspecified: Secondary | ICD-10-CM | POA: Diagnosis not present

## 2022-03-27 DIAGNOSIS — Z433 Encounter for attention to colostomy: Secondary | ICD-10-CM

## 2022-03-27 DIAGNOSIS — K439 Ventral hernia without obstruction or gangrene: Secondary | ICD-10-CM

## 2022-03-27 DIAGNOSIS — K432 Incisional hernia without obstruction or gangrene: Secondary | ICD-10-CM | POA: Diagnosis present

## 2022-03-27 DIAGNOSIS — Z87891 Personal history of nicotine dependence: Secondary | ICD-10-CM | POA: Diagnosis not present

## 2022-03-27 DIAGNOSIS — Z803 Family history of malignant neoplasm of breast: Secondary | ICD-10-CM

## 2022-03-27 DIAGNOSIS — Z933 Colostomy status: Principal | ICD-10-CM

## 2022-03-27 HISTORY — PX: COLOSTOMY REVERSAL: SHX5782

## 2022-03-27 LAB — CBC
HCT: 47 % — ABNORMAL HIGH (ref 36.0–46.0)
Hemoglobin: 14.4 g/dL (ref 12.0–15.0)
MCH: 29.8 pg (ref 26.0–34.0)
MCHC: 30.6 g/dL (ref 30.0–36.0)
MCV: 97.1 fL (ref 80.0–100.0)
Platelets: 215 10*3/uL (ref 150–400)
RBC: 4.84 MIL/uL (ref 3.87–5.11)
RDW: 13.7 % (ref 11.5–15.5)
WBC: 19.2 10*3/uL — ABNORMAL HIGH (ref 4.0–10.5)
nRBC: 0 % (ref 0.0–0.2)

## 2022-03-27 LAB — CREATININE, SERUM
Creatinine, Ser: 1.03 mg/dL — ABNORMAL HIGH (ref 0.44–1.00)
GFR, Estimated: >60 mL/min (ref >60)

## 2022-03-27 SURGERY — COLOSTOMY REVERSAL
Anesthesia: General

## 2022-03-27 MED ORDER — GABAPENTIN 300 MG PO CAPS
300.0000 mg | ORAL_CAPSULE | ORAL | Status: DC
  Filled 2022-03-27: qty 1

## 2022-03-27 MED ORDER — MIDAZOLAM HCL 2 MG/2ML IJ SOLN
INTRAMUSCULAR | Status: AC
  Filled 2022-03-27: qty 2

## 2022-03-27 MED ORDER — ONDANSETRON HCL 4 MG/2ML IJ SOLN
INTRAMUSCULAR | Status: DC | PRN
  Administered 2022-03-27: 4 mg via INTRAVENOUS

## 2022-03-27 MED ORDER — DEXAMETHASONE SODIUM PHOSPHATE 10 MG/ML IJ SOLN
INTRAMUSCULAR | Status: DC | PRN
  Administered 2022-03-27: 10 mg via INTRAVENOUS

## 2022-03-27 MED ORDER — BUPIVACAINE LIPOSOME 1.3 % IJ SUSP
INTRAMUSCULAR | Status: DC | PRN
  Administered 2022-03-27: 20 mL

## 2022-03-27 MED ORDER — ORAL CARE MOUTH RINSE: 15.0000 mL | Freq: Once | OROMUCOSAL | Status: AC

## 2022-03-27 MED ORDER — HYDROMORPHONE HCL 1 MG/ML IJ SOLN
INTRAMUSCULAR | Status: AC
  Filled 2022-03-27: qty 1

## 2022-03-27 MED ORDER — BUPIVACAINE LIPOSOME 1.3 % IJ SUSP
20.0000 mL | Freq: Once | INTRAMUSCULAR | Status: DC
Start: 2022-03-27 — End: 2022-03-27

## 2022-03-27 MED ORDER — CHLORHEXIDINE GLUCONATE CLOTH 2 % EX PADS: 6.0000 | MEDICATED_PAD | Freq: Once | CUTANEOUS | Status: DC

## 2022-03-27 MED ORDER — STERILE WATER FOR IRRIGATION IR SOLN
Status: DC | PRN
  Administered 2022-03-27: 1000 mL

## 2022-03-27 MED ORDER — BUPIVACAINE LIPOSOME 1.3 % IJ SUSP
INTRAMUSCULAR | Status: AC
  Filled 2022-03-27: qty 20

## 2022-03-27 MED ORDER — SUGAMMADEX SODIUM 500 MG/5ML IV SOLN
INTRAVENOUS | Status: AC
  Filled 2022-03-27: qty 5

## 2022-03-27 MED ORDER — ONDANSETRON HCL 4 MG/2ML IJ SOLN
INTRAMUSCULAR | Status: AC
  Filled 2022-03-27: qty 2

## 2022-03-27 MED ORDER — ROCURONIUM BROMIDE 10 MG/ML (PF) SYRINGE
PREFILLED_SYRINGE | INTRAVENOUS | Status: AC
  Filled 2022-03-27: qty 10

## 2022-03-27 MED ORDER — MIDAZOLAM HCL 5 MG/5ML IJ SOLN
INTRAMUSCULAR | Status: DC | PRN
  Administered 2022-03-27: 2 mg via INTRAVENOUS

## 2022-03-27 MED ORDER — ENSURE PRE-SURGERY PO LIQD: 592.0000 mL | Freq: Once | ORAL | Status: DC

## 2022-03-27 MED ORDER — ACETAMINOPHEN 500 MG PO TABS
1000.0000 mg | ORAL_TABLET | ORAL | Status: AC
  Administered 2022-03-27: 1000 mg via ORAL
  Filled 2022-03-27: qty 2

## 2022-03-27 MED ORDER — LIDOCAINE 2% (20 MG/ML) 5 ML SYRINGE
INTRAMUSCULAR | Status: DC | PRN
  Administered 2022-03-27: 50 mg via INTRAVENOUS

## 2022-03-27 MED ORDER — HYDROMORPHONE HCL 1 MG/ML IJ SOLN
1.0000 mg | INTRAMUSCULAR | Status: DC | PRN
  Administered 2022-03-29 – 2022-04-05 (×10): 1 mg via INTRAVENOUS
  Filled 2022-03-27 (×11): qty 1

## 2022-03-27 MED ORDER — PROPOFOL 10 MG/ML IV BOLUS
INTRAVENOUS | Status: DC | PRN
Start: 2022-03-27 — End: 2022-03-27
  Administered 2022-03-27: 130 mg via INTRAVENOUS

## 2022-03-27 MED ORDER — SIMETHICONE 80 MG PO CHEW
80.0000 mg | CHEWABLE_TABLET | Freq: Four times a day (QID) | ORAL | Status: DC | PRN
Start: 2022-03-27 — End: 2022-04-05
  Administered 2022-04-04: 80 mg via ORAL
  Filled 2022-03-27 (×2): qty 1

## 2022-03-27 MED ORDER — SODIUM CHLORIDE 0.9 % IR SOLN
Status: DC | PRN
  Administered 2022-03-27: 2000 mL
  Administered 2022-03-27: 1000 mL

## 2022-03-27 MED ORDER — PROCHLORPERAZINE EDISYLATE 10 MG/2ML IJ SOLN
10.0000 mg | INTRAMUSCULAR | Status: DC | PRN
Start: 2022-03-27 — End: 2022-04-05

## 2022-03-27 MED ORDER — HYDROMORPHONE HCL 1 MG/ML IJ SOLN
0.2500 mg | INTRAMUSCULAR | Status: DC | PRN
  Administered 2022-03-27 (×4): 0.5 mg via INTRAVENOUS

## 2022-03-27 MED ORDER — OXYCODONE HCL 5 MG PO TABS
5.0000 mg | ORAL_TABLET | ORAL | Status: DC | PRN
  Administered 2022-03-29 – 2022-04-02 (×2): 5 mg via ORAL
  Filled 2022-03-27 (×2): qty 1

## 2022-03-27 MED ORDER — BUPIVACAINE-EPINEPHRINE 0.25% -1:200000 IJ SOLN
INTRAMUSCULAR | Status: DC | PRN
  Administered 2022-03-27: 30 mL

## 2022-03-27 MED ORDER — SODIUM CHLORIDE 0.9 % IV SOLN
2.0000 g | INTRAVENOUS | Status: AC
  Administered 2022-03-27: 2 g via INTRAVENOUS
  Filled 2022-03-27: qty 2

## 2022-03-27 MED ORDER — DOCUSATE SODIUM 100 MG PO CAPS
100.0000 mg | ORAL_CAPSULE | Freq: Two times a day (BID) | ORAL | Status: DC
Start: 2022-03-27 — End: 2022-04-02
  Administered 2022-03-27 – 2022-04-01 (×11): 100 mg via ORAL
  Filled 2022-03-27 (×13): qty 1

## 2022-03-27 MED ORDER — ENOXAPARIN SODIUM 40 MG/0.4ML IJ SOSY
40.0000 mg | PREFILLED_SYRINGE | INTRAMUSCULAR | Status: DC
  Administered 2022-03-28 – 2022-04-05 (×9): 40 mg via SUBCUTANEOUS
  Filled 2022-03-27 (×9): qty 0.4

## 2022-03-27 MED ORDER — KETOROLAC TROMETHAMINE 15 MG/ML IJ SOLN
15.0000 mg | Freq: Three times a day (TID) | INTRAMUSCULAR | Status: AC
  Administered 2022-03-27 – 2022-04-01 (×14): 15 mg via INTRAVENOUS
  Filled 2022-03-27 (×15): qty 1

## 2022-03-27 MED ORDER — DEXAMETHASONE SODIUM PHOSPHATE 10 MG/ML IJ SOLN
INTRAMUSCULAR | Status: AC
  Filled 2022-03-27: qty 1

## 2022-03-27 MED ORDER — ACETAMINOPHEN 325 MG PO TABS
650.0000 mg | ORAL_TABLET | Freq: Four times a day (QID) | ORAL | Status: DC
  Administered 2022-03-27 – 2022-04-05 (×28): 650 mg via ORAL
  Filled 2022-03-27 (×30): qty 2

## 2022-03-27 MED ORDER — LACTATED RINGERS IV SOLN: INTRAVENOUS | Status: DC

## 2022-03-27 MED ORDER — METOCLOPRAMIDE HCL 5 MG/ML IJ SOLN
10.0000 mg | Freq: Four times a day (QID) | INTRAMUSCULAR | Status: AC
  Administered 2022-03-27 – 2022-04-01 (×20): 10 mg via INTRAVENOUS
  Filled 2022-03-27 (×20): qty 2

## 2022-03-27 MED ORDER — METHOCARBAMOL 500 MG IVPB - SIMPLE MED
500.0000 mg | Freq: Four times a day (QID) | INTRAVENOUS | Status: DC | PRN
  Administered 2022-04-03: 500 mg via INTRAVENOUS
  Filled 2022-03-27: qty 500
  Filled 2022-03-27: qty 50

## 2022-03-27 MED ORDER — ROCURONIUM BROMIDE 10 MG/ML (PF) SYRINGE
PREFILLED_SYRINGE | INTRAVENOUS | Status: DC | PRN
  Administered 2022-03-27: 70 mg via INTRAVENOUS
  Administered 2022-03-27: 20 mg via INTRAVENOUS
  Administered 2022-03-27: 10 mg via INTRAVENOUS
  Administered 2022-03-27 (×2): 20 mg via INTRAVENOUS

## 2022-03-27 MED ORDER — CHLORHEXIDINE GLUCONATE 0.12 % MT SOLN
15.0000 mL | Freq: Once | OROMUCOSAL | Status: AC
  Administered 2022-03-27: 15 mL via OROMUCOSAL

## 2022-03-27 MED ORDER — OXYCODONE HCL 5 MG PO TABS
10.0000 mg | ORAL_TABLET | ORAL | Status: DC | PRN
  Administered 2022-03-28 – 2022-04-03 (×17): 10 mg via ORAL
  Filled 2022-03-27 (×19): qty 2

## 2022-03-27 MED ORDER — HYDROMORPHONE HCL 1 MG/ML IJ SOLN
0.2500 mg | INTRAMUSCULAR | Status: DC | PRN
  Administered 2022-03-27 (×2): 0.5 mg via INTRAVENOUS

## 2022-03-27 MED ORDER — FENTANYL CITRATE (PF) 100 MCG/2ML IJ SOLN
INTRAMUSCULAR | Status: AC
Start: 2022-03-27 — End: ?
  Filled 2022-03-27: qty 2

## 2022-03-27 MED ORDER — SUGAMMADEX SODIUM 500 MG/5ML IV SOLN
INTRAVENOUS | Status: DC | PRN
  Administered 2022-03-27: 400 mg via INTRAVENOUS

## 2022-03-27 MED ORDER — NEOMYCIN SULFATE 500 MG PO TABS: 1000.0000 mg | ORAL_TABLET | ORAL | Status: DC

## 2022-03-27 MED ORDER — METRONIDAZOLE 500 MG PO TABS: 1000.0000 mg | ORAL_TABLET | ORAL | Status: DC

## 2022-03-27 MED ORDER — EPHEDRINE SULFATE-NACL 50-0.9 MG/10ML-% IV SOSY
PREFILLED_SYRINGE | INTRAVENOUS | Status: DC | PRN
Start: 2022-03-27 — End: 2022-03-27
  Administered 2022-03-27 (×2): 10 mg via INTRAVENOUS

## 2022-03-27 MED ORDER — FENTANYL CITRATE (PF) 100 MCG/2ML IJ SOLN
INTRAMUSCULAR | Status: DC | PRN
  Administered 2022-03-27: 100 ug via INTRAVENOUS

## 2022-03-27 MED ORDER — ONDANSETRON HCL 4 MG/2ML IJ SOLN
4.0000 mg | Freq: Four times a day (QID) | INTRAMUSCULAR | Status: DC | PRN
  Administered 2022-03-31 – 2022-04-04 (×9): 4 mg via INTRAVENOUS
  Filled 2022-03-27 (×9): qty 2

## 2022-03-27 MED ORDER — ENOXAPARIN SODIUM 40 MG/0.4ML IJ SOSY
40.0000 mg | PREFILLED_SYRINGE | Freq: Once | INTRAMUSCULAR | Status: AC
  Administered 2022-03-27: 40 mg via SUBCUTANEOUS
  Filled 2022-03-27: qty 0.4

## 2022-03-27 MED ORDER — BUPIVACAINE-EPINEPHRINE (PF) 0.25% -1:200000 IJ SOLN
INTRAMUSCULAR | Status: AC
  Filled 2022-03-27: qty 30

## 2022-03-27 MED ORDER — PROPOFOL 10 MG/ML IV BOLUS
INTRAVENOUS | Status: AC
  Filled 2022-03-27: qty 20

## 2022-03-27 MED ORDER — ENSURE PRE-SURGERY PO LIQD: 296.0000 mL | Freq: Once | ORAL | Status: DC

## 2022-03-27 SURGICAL SUPPLY — 79 items
APPLIER CLIP 5 13 M/L LIGAMAX5 (MISCELLANEOUS)
BAG COUNTER SPONGE SURGICOUNT (BAG) ×2 IMPLANT
BINDER ABDOMINAL 12 ML 46-62 (SOFTGOODS) ×1 IMPLANT
BLADE EXTENDED COATED 6.5IN (ELECTRODE) IMPLANT
CABLE HIGH FREQUENCY MONO STRZ (ELECTRODE) IMPLANT
CELLS DAT CNTRL 66122 CELL SVR (MISCELLANEOUS) IMPLANT
CHLORAPREP W/TINT 26 (MISCELLANEOUS) ×2 IMPLANT
CLIP APPLIE 5 13 M/L LIGAMAX5 (MISCELLANEOUS) IMPLANT
DERMABOND ADVANCED (GAUZE/BANDAGES/DRESSINGS) ×1
DERMABOND ADVANCED .7 DNX12 (GAUZE/BANDAGES/DRESSINGS) ×1 IMPLANT
DRAIN CHANNEL 19F RND (DRAIN) ×1 IMPLANT
DRAPE LAPAROSCOPIC ABDOMINAL (DRAPES) ×2 IMPLANT
DRAPE SURG IRRIG POUCH 19X23 (DRAPES) ×2 IMPLANT
DRSG OPSITE POSTOP 4X10 (GAUZE/BANDAGES/DRESSINGS) IMPLANT
DRSG OPSITE POSTOP 4X6 (GAUZE/BANDAGES/DRESSINGS) IMPLANT
DRSG OPSITE POSTOP 4X8 (GAUZE/BANDAGES/DRESSINGS) IMPLANT
DRSG VAC ATS MED SENSATRAC (GAUZE/BANDAGES/DRESSINGS) ×1 IMPLANT
DRSG VERSA FOAM LRG 10X15 (GAUZE/BANDAGES/DRESSINGS) ×2 IMPLANT
ELECT REM PT RETURN 15FT ADLT (MISCELLANEOUS) ×2 IMPLANT
EVACUATOR SILICONE 100CC (DRAIN) ×1 IMPLANT
GAUZE SPONGE 4X4 12PLY STRL (GAUZE/BANDAGES/DRESSINGS) IMPLANT
GLOVE SURG ENC MOIS LTX SZ6.5 (GLOVE) ×4 IMPLANT
GLOVE SURG UNDER LTX SZ6.5 (GLOVE) ×2 IMPLANT
GLOVE SURG UNDER POLY LF SZ7 (GLOVE) ×4 IMPLANT
GOWN STRL REUS W/ TWL XL LVL3 (GOWN DISPOSABLE) ×6 IMPLANT
GOWN STRL REUS W/TWL XL LVL3 (GOWN DISPOSABLE) ×3
HOLDER FOLEY CATH W/STRAP (MISCELLANEOUS) ×2 IMPLANT
IRRIG SUCT STRYKERFLOW 2 WTIP (MISCELLANEOUS) ×2
IRRIGATION SUCT STRKRFLW 2 WTP (MISCELLANEOUS) ×1 IMPLANT
KIT TURNOVER KIT A (KITS) IMPLANT
LIGASURE IMPACT 36 18CM CVD LR (INSTRUMENTS) ×1 IMPLANT
MARKER SKIN DUAL TIP RULER LAB (MISCELLANEOUS) ×1 IMPLANT
MESH VICRYL KNITTED 12X12 (Mesh General) ×1 IMPLANT
PACK COLON (CUSTOM PROCEDURE TRAY) ×2 IMPLANT
PAD POSITIONING PINK XL (MISCELLANEOUS) ×1 IMPLANT
PENCIL SMOKE EVACUATOR (MISCELLANEOUS) IMPLANT
PROTECTOR NERVE ULNAR (MISCELLANEOUS) ×2 IMPLANT
RELOAD STAPLE 60 2.6 WHT THN (STAPLE) IMPLANT
RELOAD STAPLER WHITE 60MM (STAPLE) ×2 IMPLANT
RETRACTOR WND ALEXIS 18 MED (MISCELLANEOUS) IMPLANT
RTRCTR WOUND ALEXIS 18CM MED (MISCELLANEOUS)
SCISSORS LAP 5X35 DISP (ENDOMECHANICALS) ×2 IMPLANT
SEALER TISSUE G2 STRG ARTC 35C (ENDOMECHANICALS) ×1 IMPLANT
SET TUBE SMOKE EVAC HIGH FLOW (TUBING) ×2 IMPLANT
SLEEVE XCEL OPT CAN 5 100 (ENDOMECHANICALS) ×2 IMPLANT
SPIKE FLUID TRANSFER (MISCELLANEOUS) ×2 IMPLANT
SPONGE DRAIN TRACH 4X4 STRL 2S (GAUZE/BANDAGES/DRESSINGS) ×1 IMPLANT
SPONGE T-LAP 18X18 ~~LOC~~+RFID (SPONGE) ×2 IMPLANT
STAPLER ECHELON LONG 60 440 (INSTRUMENTS) ×1 IMPLANT
STAPLER RELOAD WHITE 60MM (STAPLE) ×4
SURGILUBE 2OZ TUBE FLIPTOP (MISCELLANEOUS) ×2 IMPLANT
SUT ETHILON 2 0 PS N (SUTURE) ×1 IMPLANT
SUT MNCRL AB 4-0 PS2 18 (SUTURE) ×4 IMPLANT
SUT NOVA NAB DX-16 0-1 5-0 T12 (SUTURE) ×4 IMPLANT
SUT PROLENE 0 CT 1 30 (SUTURE) ×4 IMPLANT
SUT PROLENE 2 0 KS (SUTURE) IMPLANT
SUT SILK 2 0 (SUTURE) ×1
SUT SILK 2 0 SH CR/8 (SUTURE) IMPLANT
SUT SILK 2-0 18XBRD TIE 12 (SUTURE) ×1 IMPLANT
SUT SILK 3 0 (SUTURE)
SUT SILK 3 0 SH CR/8 (SUTURE) ×2 IMPLANT
SUT SILK 3-0 18XBRD TIE 12 (SUTURE) IMPLANT
SUT STRAFIX SYMMETRIC 1-0 24 (SUTURE) ×4
SUT VIC AB 0 CT1 27 (SUTURE) ×8
SUT VIC AB 0 CT1 27XBRD ANTBC (SUTURE) IMPLANT
SUT VIC AB 2-0 SH 18 (SUTURE) ×10 IMPLANT
SUT VIC AB 4-0 PS2 27 (SUTURE) ×2 IMPLANT
SUT VICRYL 0 UR6 27IN ABS (SUTURE) ×2 IMPLANT
SUTURE STRAFIX SYMMETRC 1-0 24 (SUTURE) IMPLANT
SYS LAPSCP GELPORT 120MM (MISCELLANEOUS)
SYS WOUND ALEXIS 18CM MED (MISCELLANEOUS) ×2
SYSTEM LAPSCP GELPORT 120MM (MISCELLANEOUS) IMPLANT
SYSTEM WOUND ALEXIS 18CM MED (MISCELLANEOUS) ×1 IMPLANT
TOWEL OR NON WOVEN STRL DISP B (DISPOSABLE) ×2 IMPLANT
TOWEL ~~LOC~~+RFID 17X26 BLUE (SPONGE) ×1 IMPLANT
TRAY FOLEY MTR SLVR 16FR STAT (SET/KITS/TRAYS/PACK) IMPLANT
TROCAR BLADELESS OPT 5 100 (ENDOMECHANICALS) ×1 IMPLANT
TROCAR XCEL BLUNT TIP 100MML (ENDOMECHANICALS) IMPLANT
TUBING CONNECTING 10 (TUBING) ×2 IMPLANT
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
## 2022-03-27 NOTE — Anesthesia Preprocedure Evaluation (Addendum)
Anesthesia Evaluation  ?Patient identified by MRN, date of birth, ID band ?Patient awake ? ? ? ?Reviewed: ?Allergy & Precautions, H&P , NPO status , Patient's Chart, lab work & pertinent test results ? ?Airway ?Mallampati: III ? ?TM Distance: >3 FB ?Neck ROM: Full ? ? ? Dental ?no notable dental hx. ?(+) Teeth Intact, Dental Advisory Given ?  ?Pulmonary ?neg pulmonary ROS, former smoker,  ?  ?Pulmonary exam normal ?breath sounds clear to auscultation ? ? ? ? ? ? Cardiovascular ?negative cardio ROS ? ?+ dysrhythmias  ?Rhythm:Regular Rate:Normal ? ? ?  ?Neuro/Psych ?Depression negative neurological ROS ?   ? GI/Hepatic ?negative GI ROS, Neg liver ROS,   ?Endo/Other  ?negative endocrine ROS ? Renal/GU ?negative Renal ROS  ?negative genitourinary ?  ?Musculoskeletal ? ? Abdominal ?  ?Peds ? Hematology ?negative hematology ROS ?(+)   ?Anesthesia Other Findings ? ? Reproductive/Obstetrics ?negative OB ROS ? ?  ? ? ? ? ? ? ? ? ? ? ? ? ? ?  ?  ? ? ? ? ? ? ? ?Anesthesia Physical ?Anesthesia Plan ? ?ASA: 2 ? ?Anesthesia Plan: General  ? ?Post-op Pain Management: Tylenol PO (pre-op)*  ? ?Induction: Intravenous ? ?PONV Risk Score and Plan: 4 or greater and Ondansetron, Dexamethasone and Midazolam ? ?Airway Management Planned: Oral ETT ? ?Additional Equipment:  ? ?Intra-op Plan:  ? ?Post-operative Plan: Extubation in OR ? ?Informed Consent: I have reviewed the patients History and Physical, chart, labs and discussed the procedure including the risks, benefits and alternatives for the proposed anesthesia with the patient or authorized representative who has indicated his/her understanding and acceptance.  ? ? ? ?Dental advisory given ? ?Plan Discussed with: CRNA ? ?Anesthesia Plan Comments:   ? ? ? ? ? ? ?Anesthesia Quick Evaluation ? ?

## 2022-03-27 NOTE — Transfer of Care (Signed)
Immediate Anesthesia Transfer of Care Note ? ?Patient: Sara [name] ? ?Procedure(s) Performed: Procedure(s): ?COLOSTOMY TAKEDOWN VENTRAL HERNIA REPAIR (N/A) ? ?Patient Location: PACU ? ?Anesthesia Type:General ? ?Level of Consciousness: Alert, Awake, Oriented ? ?Airway & Oxygen Therapy: Patient Spontanous Breathing ? ?Post-op Assessment: Report given to RN ? ?Post vital signs: Reviewed and stable ? ?Last Vitals:  ?Vitals:  ? 03/27/22 0715 03/27/22 1427  ?BP: (P) 114/65 113/67  ?Pulse: (P) 65 65  ?Resp: (P) 18 18  ?Temp: (P) 36.9 ?C   ?SpO2: (P) 99% 100%  ? ? ?Complications: No apparent anesthesia complications ? ?
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
## 2022-03-27 NOTE — Anesthesia Postprocedure Evaluation (Signed)
Anesthesia Post Note ? ?Patient: Sara [name] ? ?Procedure(s) Performed: COLOSTOMY TAKEDOWN VENTRAL HERNIA REPAIR ? ?  ? ?Patient location during evaluation: PACU ?Anesthesia Type: General ?Level of consciousness: awake and alert ?Pain management: pain level controlled ?Vital Signs Assessment: post-procedure vital signs reviewed and stable ?Respiratory status: spontaneous breathing, nonlabored ventilation and respiratory function stable ?Cardiovascular status: blood pressure returned to baseline and stable ?Postop Assessment: no apparent nausea or vomiting ?Anesthetic complications: no ? ? ?No notable events documented. ? ?Last Vitals:  ?Vitals:  ? 03/27/22 1515 03/27/22 1530  ?BP: 130/77 129/85  ?Pulse: 77 77  ?Resp: 11 10  ?Temp:  (!) 36.4 ?C  ?SpO2: 100% 99%  ?  ?Last Pain:  ?Vitals:  ? 03/27/22 1533  ?TempSrc:   ?PainSc: Asleep  ? ? ?  ?  ?  ?  ?  ?  ? ?Lidia Collum ? ? ? ? ?

## 2022-03-27 NOTE — Anesthesia Procedure Notes (Signed)
Procedure Name: Intubation ?Date/Time: 03/27/2022 9:38 AM ?Performed by: Gerald Leitz, CRNA ?Pre-anesthesia Checklist: Patient identified, Patient being monitored, Timeout performed, Emergency Drugs available and Suction available ?Patient Re-evaluated:Patient Re-evaluated prior to induction ?Oxygen Delivery Method: Circle system utilized ?Preoxygenation: Pre-oxygenation with 100% oxygen ?Induction Type: IV induction ?Ventilation: Mask ventilation without difficulty ?Laryngoscope Size: Mac and 3 ?Grade View: Grade I ?Tube type: Oral ?Tube size: 7.0 mm ?Number of attempts: 1 ?Airway Equipment and Method: Stylet ?Placement Confirmation: ETT inserted through vocal cords under direct vision, positive ETCO2 and breath sounds checked- equal and bilateral ?Secured at: 21 cm ?Tube secured with: Tape ?Dental Injury: Teeth and Oropharynx as per pre-operative assessment  ? ? ? ? ?

## 2022-03-28 ENCOUNTER — Ambulatory Visit: Payer: Medicaid Other

## 2022-03-28 ENCOUNTER — Encounter (HOSPITAL_COMMUNITY): Payer: Self-pay | Admitting: Surgery

## 2022-03-28 LAB — BASIC METABOLIC PANEL
Anion gap: 8 (ref 5–15)
BUN: 14 mg/dL (ref 6–20)
CO2: 26 mmol/L (ref 22–32)
Calcium: 8.5 mg/dL — ABNORMAL LOW (ref 8.9–10.3)
Chloride: 102 mmol/L (ref 98–111)
Creatinine, Ser: 1.05 mg/dL — ABNORMAL HIGH (ref 0.44–1.00)
GFR, Estimated: >60 mL/min (ref >60)
Glucose, Bld: 134 mg/dL — ABNORMAL HIGH (ref 70–99)
Potassium: 4 mmol/L (ref 3.5–5.1)
Sodium: 136 mmol/L (ref 135–145)

## 2022-03-28 LAB — CBC
HCT: 44.5 % (ref 36.0–46.0)
Hemoglobin: 13.7 g/dL (ref 12.0–15.0)
MCH: 29.8 pg (ref 26.0–34.0)
MCHC: 30.8 g/dL (ref 30.0–36.0)
MCV: 96.7 fL (ref 80.0–100.0)
Platelets: 216 10*3/uL (ref 150–400)
RBC: 4.6 MIL/uL (ref 3.87–5.11)
RDW: 13.7 % (ref 11.5–15.5)
WBC: 15.1 10*3/uL — ABNORMAL HIGH (ref 4.0–10.5)
nRBC: 0 % (ref 0.0–0.2)
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
## 2022-03-28 NOTE — Progress Notes (Signed)
?  Transition of Care (TOC) Screening Note ? ? ?Patient Details  ?Name: Sara [name] ?Date of Birth: 12-Nov-1966 ? ? ?Transition of Care (TOC) CM/SW Contact:    ?Skilar Marcou, LCSW ?Phone Number: ?03/28/2022, 1:43 PM ? ? ? ?Transition of Care Department [facility]) has reviewed patient and no TOC needs have been identified at this time. We will continue to monitor patient advancement through interdisciplinary progression rounds. If new patient transition needs arise, please place a TOC consult. ? ? ?

## 2022-03-29 LAB — CBC
HCT: 37 % (ref 36.0–46.0)
Hemoglobin: 11.4 g/dL — ABNORMAL LOW (ref 12.0–15.0)
MCH: 29.5 pg (ref 26.0–34.0)
MCHC: 30.8 g/dL (ref 30.0–36.0)
MCV: 95.9 fL (ref 80.0–100.0)
Platelets: 177 10*3/uL (ref 150–400)
RBC: 3.86 MIL/uL — ABNORMAL LOW (ref 3.87–5.11)
RDW: 13.9 % (ref 11.5–15.5)
WBC: 11.7 10*3/uL — ABNORMAL HIGH (ref 4.0–10.5)
nRBC: 0 % (ref 0.0–0.2)

## 2022-03-29 LAB — BASIC METABOLIC PANEL
Anion gap: 5 (ref 5–15)
BUN: 14 mg/dL (ref 6–20)
CO2: 28 mmol/L (ref 22–32)
Calcium: 8 mg/dL — ABNORMAL LOW (ref 8.9–10.3)
Chloride: 101 mmol/L (ref 98–111)
Creatinine, Ser: 1.04 mg/dL — ABNORMAL HIGH (ref 0.44–1.00)
GFR, Estimated: >60 mL/min (ref >60)
Glucose, Bld: 107 mg/dL — ABNORMAL HIGH (ref 70–99)
Potassium: 3.7 mmol/L (ref 3.5–5.1)
Sodium: 134 mmol/L — ABNORMAL LOW (ref 135–145)

## 2022-03-29 LAB — SURGICAL PATHOLOGY
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
## 2022-03-29 NOTE — Progress Notes (Signed)
Wound vac changed at bedside.  Black sponge and two white sponges removed.  Had patient cough while sponges removed, tension appears reasonable on bridging vicryl mesh.  One white sponge and black sponge replaced and vac placed back to suction, holding a seal nicely.  Will plan to change vac again next week. ?

## 2022-03-30 NOTE — Plan of Care (Signed)
  Problem: Skin Integrity: Goal: Demonstration of wound healing without infection will improve Outcome: Progressing   
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
## 2022-03-31 MED ORDER — BOOST / RESOURCE BREEZE PO LIQD CUSTOM
1.0000 | Freq: Three times a day (TID) | ORAL | Status: DC
  Administered 2022-03-31 – 2022-04-05 (×11): 1 via ORAL

## 2022-03-31 MED ORDER — CHLORHEXIDINE GLUCONATE CLOTH 2 % EX PADS
6.0000 | MEDICATED_PAD | Freq: Every day | CUTANEOUS | Status: DC
  Administered 2022-03-31 – 2022-04-05 (×2): 6 via TOPICAL
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
## 2022-04-01 ENCOUNTER — Ambulatory Visit: Payer: Medicaid Other
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
## 2022-04-01 NOTE — Plan of Care (Signed)
  Problem: Coping: Goal: Level of anxiety will decrease Outcome: Progressing   Problem: Pain Managment: Goal: General experience of comfort will improve Outcome: Progressing   
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
## 2022-04-02 NOTE — Consult Note (Signed)
WOC consult requested to perform Vac dressing change to abd wound on Fri.  Discussed wound appearance and plan of care with Dr Dossie Der via phone call. ?Cammie Mcgee MSN, RN, CWOCN, CWCN-AP, CNS ?[phone]  ?
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
## 2022-04-02 NOTE — Plan of Care (Signed)
  Problem: Education: Goal: Required Educational Video(s) Outcome: Progressing   Problem: Clinical Measurements: Goal: Ability to maintain clinical measurements within normal limits will improve Outcome: Progressing   Problem: Skin Integrity: Goal: Demonstration of wound healing without infection will improve Outcome: Progressing   
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
## 2022-04-02 NOTE — Progress Notes (Signed)
Wound vac changed at bedside.  Black sponge and white sponge removed.  Diamond shaped wound at central 1/3 of midline laparotomy incision.  Upper and lower thirds of  incision with monocryl subcuticular closure and prolene interrupted vertical mattress closure intact.  Ostomy site closed with monocryl subcuticular closure c/d/I with a small amount of bloody drainage from lateral aspect of wound.   The diamond shaped wound at central 1/3 of midline laparotomy incision has healthy skin and fatty tissue edges with vicryl mesh base.  The wound measures 11cm tall by 14 cm wide and 2 cm deep at the deepest edge (left corner).   ? ?One white sponge and black sponge replaced and vac placed back to suction, holding a seal nicely.  Consulted wound/ostomy team to assist with next change - plan for Friday. ? ?Quentin Ore, MD ?General, Bariatric and Minimally Invasive Surgery ?[facility], [place] ?   ?

## 2022-04-03 ENCOUNTER — Ambulatory Visit: Payer: Medicaid Other

## 2022-04-03 MED ORDER — OXYCODONE HCL 5 MG PO TABS
15.0000 mg | ORAL_TABLET | ORAL | Status: DC | PRN
  Administered 2022-04-03 – 2022-04-05 (×9): 15 mg via ORAL
  Filled 2022-04-03 (×9): qty 3

## 2022-04-03 MED ORDER — OXYCODONE HCL 5 MG PO TABS: 10.0000 mg | ORAL_TABLET | ORAL | Status: DC | PRN

## 2022-04-03 NOTE — Plan of Care (Signed)
  Problem: Pain Managment: Goal: General experience of comfort will improve Outcome: Progressing   
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
## 2022-04-03 NOTE — Plan of Care (Signed)
  Problem: Education: Goal: Required Educational Video(s) Outcome: Progressing   Problem: Clinical Measurements: Goal: Ability to maintain clinical measurements within normal limits will improve Outcome: Progressing Goal: Postoperative complications will be avoided or minimized Outcome: Progressing   Problem: Skin Integrity: Goal: Demonstration of wound healing without infection will improve Outcome: Progressing   Problem: Education: Goal: Knowledge of General Education information will improve Description: Including pain rating scale, medication(s)/side effects and non-pharmacologic comfort measures Outcome: Progressing   Problem: Health Behavior/Discharge Planning: Goal: Ability to manage health-related needs will improve Outcome: Progressing   Problem: Clinical Measurements: Goal: Ability to maintain clinical measurements within normal limits will improve Outcome: Progressing Goal: Will remain free from infection Outcome: Progressing Goal: Diagnostic test results will improve Outcome: Progressing Goal: Respiratory complications will improve Outcome: Progressing Goal: Cardiovascular complication will be avoided Outcome: Progressing   Problem: Activity: Goal: Risk for activity intolerance will decrease Outcome: Progressing   Problem: Nutrition: Goal: Adequate nutrition will be maintained Outcome: Progressing   Problem: Coping: Goal: Level of anxiety will decrease Outcome: Progressing   Problem: Elimination: Goal: Will not experience complications related to bowel motility Outcome: Progressing Goal: Will not experience complications related to urinary retention Outcome: Progressing   Problem: Pain Managment: Goal: General experience of comfort will improve Outcome: Progressing   Problem: Safety: Goal: Ability to remain free from injury will improve Outcome: Progressing   Problem: Skin Integrity: Goal: Risk for impaired skin integrity will  decrease Outcome: Progressing   
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
## 2022-04-04 NOTE — TOC Progression Note (Addendum)
Transition of Care (TOC) - Progression Note  ? ? ?Patient Details  ?Name: Sara [name] ?MRN: [id number] ?Date of Birth: 11/08/1966 ? ?Transition of Care (TOC) CM/SW Contact  ?Tyleek Smick, LCSW ?Phone Number: ?04/04/2022, 3:38 PM ? ?Clinical Narrative:    ? ?Have alerted pt and MD that I have been unsuccessful with securing any HHRN coverage at this time.  Only one HH agency has not responded ([facility]) but very concerned they will decline as well.  ?[facility]: no staffing to cover ?[facility]:     no staffing ?[facility]:     no staffing/ insurance  ?[facility]:  out of network (OON) ?[facility]:  OON ?[facility]:  OON ?[facility]: OON ?[facility]:     no staffing ?[facility]:          OON ?[facility]:       no staffing  ?[facility]:  no response yet ? ?Order for the [facility] is being processed.  Continue to follow. ? ?Expected Discharge Plan: Home w Home Health Services ?  ? ?Expected Discharge Plan and Services ?Expected Discharge Plan: Home w Home Health Services ?In-house Referral: Clinical Social Work ?  ?Post Acute Care Choice: Home Health ?Living arrangements for the past 2 months: Single Family Home ?                ?  ?  ?  ?  ?  ?  ?  ?  ?  ?  ? ? ?Social Determinants of Health (SDOH) Interventions ?  ? ?Readmission Risk Interventions ? ?  04/02/2022  ?  1:28 PM  ?Readmission Risk Prevention Plan  ?Post Dischage Appt Complete  ?Medication Screening Complete  ?Transportation Screening Complete  ? ? ?

## 2022-04-04 NOTE — Plan of Care (Signed)
Problem: Clinical Measurements: ?Goal: Ability to maintain clinical measurements within normal limits will improve ?Outcome: Progressing ?  ?Problem: Activity: ?Goal: Risk for activity intolerance will decrease ?Outcome: Progressing ?  ?Problem: Coping: ?Goal: Level of anxiety will decrease ?Outcome: Progressing ?  ?Haydee Salter, RN ?04/04/22 ?1:22 PM ? ?
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
## 2022-04-05 MED ORDER — OXYCODONE-ACETAMINOPHEN 5-325 MG PO TABS: 1.0000 | ORAL_TABLET | ORAL | 0 refills | Status: DC | PRN
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
## 2022-04-05 NOTE — Plan of Care (Signed)
Patient discharging home via private vehicle with son Chrissie Noa. ?Haydee Salter, RN ?04/05/22 ?3:51 PM ? ?

## 2022-04-05 NOTE — Discharge Instructions (Signed)
 COLECTOMY POST OPERATIVE INSTRUCTIONS  Thinking Clearly  The anesthesia may cause you to feel different for 1 or 2 days. Do not drive, drink alcohol, or make any big decisions for at least 2 days.  Nutrition When you wake up, you will be able to drink small amounts of liquid. If you do not feel sick, you can slowly advance your diet to regular foods. Continue to drink lots of fluids, usually about 8 to 10 glasses per day. Eat a high-fiber diet so you don't strain during bowel movements. High-Fiber Foods Foods high in fiber include beans, bran cereals and whole-grain breads, peas, dried fruit (figs, apricots, and dates), raspberries, blackberries, strawberries, sweet corn, broccoli, baked potatoes with skin, plums, pears, apples, greens, and nuts. Activity Slowly increase your activity. Be sure to get up and walk every hour or so to prevent blood clots. No heavy lifting or strenuous activity for 4 weeks following surgery to prevent hernias at your incision sites It is normal to feel tired. You may need more sleep than usual.  Get your rest but make sure to get up and move around frequently to prevent blood clots and pneumonia.  Work and Return to School You can go back to work when you feel well enough. Discuss the timing with your surgeon. You can usually go back to school or work 1 week or less after an laparoscopic surgery and two weeks after an open surgery. If your work requires heavy lifting or strenuous activity you need to be placed on light duty for 4 weeks following surgery. You can return to gym class, sports or other physical activities 4 weeks after surgery.  Wound Care Always wash your hands before and after touching near your incision site. Do not soak in a bathtub until cleared at your follow up appointment. You may take a shower 24 hours after surgery. A small amount of drainage from the incision is normal. If the drainage is thick and yellow or the site is red, you may  have an infection, so call your surgeon. If you have a drain in one of your incisions, it will be taken out in office when the drainage stops. Steri-Strips will fall off in 7 to 10 days or they will be removed during your first office visit. If you have dermabond glue covering over the incision, allow the glue to flake off on its own. Avoid wearing tight or rough clothing. It may rub your incisions and make it harder for them to heal. Protect the new skin, especially from the sun. The sun can burn and cause darker scarring. Your scar will heal in about 4 to 6 weeks and will become softer and continue to fade over the next year.  The cosmetic appearance of the incisions will improve over the course of the first year after surgery. Sensation around your incision will return in a few weeks or months.  Bowel Movements After intestinal surgery, you may have loose watery stools for several days. If watery diarrhea lasts longer than 3 days, contact your surgeon. Pain medication (narcotics) can cause constipation. Increase the fiber in your diet with high-fiber foods if you are constipated. You can take an over the counter stool softener like Colace to avoid constipation.  Additional over the counter medications can also be used if Colace isn't sufficient (for example, Milk of Magnesia or Miralax).  Pain The amount of pain is different for each person. Some people need only 1 to 3 doses of pain control   medication, while others need more. Take alternating doses of tylenol and ibuprofen around the clock for the first five days following surgery.  This will provide a baseline of pain control and help with inflammation.  Take the narcotic pain medication in addition if needed for severe pain.  Contact Your Surgeon at [phone], if you have: Pain that will not go away Pain that gets worse A fever of more than 101F (38.3C) Repeated vomiting Swelling, redness, bleeding, or bad-smelling drainage from your  wound site Strong abdominal pain No bowel movement or unable to pass gas for 3 days Watery diarrhea lasting longer than 3 days  Pain Control The goal of pain control is to minimize pain, keep you moving and help you heal. Your surgical team will work with you on your pain plan. Most often a combination of therapies and medications are used to control your pain. You may also be given medication (local anesthetic) at the surgical site. This may help control your pain for several days. Extreme pain puts extra stress on your body at a time when your body needs to focus on healing. Do not wait until your pain has reached a level "10" or is unbearable before telling your doctor or nurse. It is much easier to control pain before it becomes severe. Following a laparoscopic procedure, pain is sometimes felt in the shoulder. This is due to the gas inserted into your abdomen during the procedure. Moving and walking helps to decrease the gas and the right shoulder pain.  Use the guide below for ways to manage your post-operative pain. Learn more by going to facs.org/safepaincontrol.  How Intense Is My Pain Common Therapies to Feel Better       I hardly notice my pain, and it does not interfere with my activities.  I notice my pain and it distracts me, but I can still do activities (sitting up, walking, standing).  Non-Medication Therapies  Ice (in a bag, applied over clothing at the surgical site), elevation, rest, meditation, massage, distraction (music, TV, play) walking and mild exercise Splinting the abdomen with pillows +  Non-Opioid Medications Acetaminophen (Tylenol) Non-steroidal anti-inflammatory drugs (NSAIDS) Aspirin, Ibuprofen (Motrin, Advil) Naproxen (Aleve) Take these as needed, when you feel pain. Both acetaminophen and NSAIDs help to decrease pain and swelling (inflammation).      My pain is hard to ignore and is more noticeable even when I rest.  My pain interferes with  my usual activities.  Non-Medication Therapies  +  Non-Opioid medications  Take on a regular schedule (around-the-clock) instead of as needed. (For example, Tylenol every 6 hours at 9:00 am, 3:00 pm, 9:00 pm, 3:00 am and Motrin every 6 hours at 12:00 am, 6:00 am, 12:00 pm, 6:00 pm)         I am focused on my pain, and I am not doing my daily activities.  I am groaning in pain, and I cannot sleep. I am unable to do anything.  My pain is as bad as it could be, and nothing else matters.  Non-Medication Therapies  +  Around-the-Clock Non-Opioid Medications  +  Short-acting opioids  Opioids should be used with other medications to manage severe pain. Opioids block pain and give a feeling of euphoria (feel high). Addiction, a serious side effect of opioids, is rare with short-term (a few days) use.  Examples of short-acting opioids include: Tramadol (Ultram), Hydrocodone (Norco, Vicodin), Hydromorphone (Dilaudid), Oxycodone (Oxycontin)     The above directions have been adapted from   the American College of Surgeons Surgical Patient Education Program.  Please refer to the ACS website if needed: [email or web address]://[email or web address].org/-/media/files/education/[email or web address].[email or web address]   Genise Strack, MD [facility] [address], [address], [place], [place]  [address] ?  [address] [address], [place], [place]   [address] [phone] ? [phone] ? FAX [phone] Web site: [email or web address]  

## 2022-04-05 NOTE — Consult Note (Addendum)
[place] Nurse wound follow up ?Surgical team following for assessment and plan of care.  Vac dressing changed to midline abd full thickness post-op wound. Pt was medicated for pain prior to the procedure and tolerated with minimal amt discomfort.  Wound is beefy red over vicryl mesh, 11X4X1cm, mod amt yellow drainage in the cannister. Sutures above and below the full thickness wound and are well-approximated. ?Dressing procedure/placement/frequency: Applied one piece Mepitel contact layer, then one piece white foam, and one piece black foam to 161mm cont suction.  Extra supplies at the bedside.  Pt plans to discharge home with negative presssure wound therapy.  [place] team will plan to change Mon if she is still in the hospital at that time.  ?Julien Girt MSN, RN, [place], [place], CNS ?[phone]  ?
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
## 2022-04-05 NOTE — Progress Notes (Signed)
Talked with Lennart Pall LCSW.  Only real option for wound vac changes is [facility] ~10 min drive from her house.  Final confirmation that they can take her won't happen till Tuesday.  Feels wasteful to keep her in the hospital all weekend.  Will discharge today and plan on vac change at [facility] wound clinic on Wednesday.  If needs arise in the meantime, I will try to handle outpatient or will be forced to direct admit the patient.   Discussed plan with Dr. Redmond Pulling who saw her today - he agrees with plan. ? ?Felicie Morn, MD ?General, Bariatric and Minimally Invasive Surgery ?[facility] [place] ? ?

## 2022-04-05 NOTE — TOC Transition Note (Signed)
Transition of Care (TOC) - CM/SW Discharge Note ? ? ?Patient Details  ?Name: Sara [name] ?MRN: [id number] ?Date of Birth: January 21, 1966 ? ?Transition of Care (TOC) CM/SW Contact:  ?Rhayne Chatwin, LCSW ?Phone Number: ?04/05/2022, 1:34 PM ? ? ?Clinical Narrative:    ?Have been declined by ALL Home health agencies in providing [facility] visits for wound VAC changes (see prior TOC note).  Have contacted the [facility] (spoke with Freda Munro and Caryl Asp) to review possible referral there as this is close to patient's home and patient confirms she has needed transportation support.  The [facility] wound center as tentatively scheduled pt for earliest appointment available on Wed 4/12 @ 2:15pm.  Have explained to pt and MD that this is a [facility] appointment pending the center Director, Cornell Barman, approval on Tuesday 4/11 when she returns to the office.  Pt and MD are agreeable with plan for dc home today once RN has switched pt over to the [facility] home wound vac system.  MD has reviewed what should be done if pt has issues with wound/ wound VAC prior to 4/12 appointment.  Per MD, [facility] offfice can possibly serve as another alternative of location for wound vac change if needed vs readmit to hospital.  [facility] will follow up with wound clinic on Tuesday to confirm acceptance and, if declined, will alert [facility] MD. ? ? ?  ?Barriers to Discharge: Barriers Resolved ? ? ?Patient Goals and CMS Choice ?Patient states their goals for this hospitalization and ongoing recovery are:: return home ?  ?  ? ?Discharge Placement ?  ?           ?  ?  ?  ?  ? ?Discharge Plan and Services ?In-house Referral: Clinical Social Work ?  ?Post Acute Care Choice: Home Health          ?  ?DME Agency: [employer] ?  ?  ?Representative spoke with at DME Agency: Leontine Locket ?  ?  ?  ?  ?  ? ?Social Determinants of Health (SDOH) Interventions ?  ? ? ?Readmission Risk Interventions ? ?  04/02/2022  ?  1:28 PM  ?Readmission Risk Prevention Plan  ?Post Dischage Appt  Complete  ?Medication Screening Complete  ?Transportation Screening Complete  ? ? ? ? ? ?

## 2022-04-05 NOTE — Plan of Care (Signed)
Problem: Clinical Measurements: ?Goal: Ability to maintain clinical measurements within normal limits will improve ?Outcome: Progressing ?  ?Problem: Education: ?Goal: Knowledge of General Education information will improve ?Description: Including pain rating scale, medication(s)/side effects and non-pharmacologic comfort measures ?Outcome: Progressing ?  ?Problem: Pain Managment: ?Goal: General experience of comfort will improve ?Outcome: Progressing ?  ?Ivan Anchors, RN ?04/05/22 ?8:34 AM ? ?

## 2022-04-08 ENCOUNTER — Ambulatory Visit: Payer: Medicaid Other

## 2022-04-10 ENCOUNTER — Ambulatory Visit: Payer: Medicaid Other

## 2022-04-10 ENCOUNTER — Encounter: Payer: Medicaid Other | Attending: Internal Medicine | Admitting: Internal Medicine

## 2022-04-10 DIAGNOSIS — I499 Cardiac arrhythmia, unspecified: Secondary | ICD-10-CM | POA: Diagnosis not present

## 2022-04-10 DIAGNOSIS — T8131XA Disruption of external operation (surgical) wound, not elsewhere classified, initial encounter: Secondary | ICD-10-CM | POA: Diagnosis not present

## 2022-04-10 DIAGNOSIS — L98495 Non-pressure chronic ulcer of skin of other sites with muscle involvement without evidence of necrosis: Secondary | ICD-10-CM | POA: Insufficient documentation

## 2022-04-10 DIAGNOSIS — S31103A Unspecified open wound of abdominal wall, right lower quadrant without penetration into peritoneal cavity, initial encounter: Secondary | ICD-10-CM | POA: Diagnosis not present

## 2022-04-10 DIAGNOSIS — X58XXXA Exposure to other specified factors, initial encounter: Secondary | ICD-10-CM | POA: Insufficient documentation

## 2022-04-10 DIAGNOSIS — J45998 Other asthma: Secondary | ICD-10-CM | POA: Diagnosis not present

## 2022-04-10 NOTE — Progress Notes (Signed)
Lonsway, Wilmer A. ([id number]) ?Visit Report for 04/10/2022 ?Abuse Risk Screen Details ?Patient Name: Sara [name], [name] ?Date of Service: 04/10/2022 1:45 PM ?Medical Record Number: [id number] ?Patient Account Number: [id number] ?Date of Birth/Sex: 11/12/1966 (56 y.o. F) ?Treating RN: Hansel Feinstein ?Primary Care Denvil Canning: Meryl Crutch Other Clinician: ?Referring Jehiel Koepp: STECHSCHULTE, Renae Fickle ?Treating Kaiyana Bedore/Extender: Maxwell Caul ?Weeks in Treatment: 0 ?Abuse Risk Screen Items ?Answer ?ABUSE RISK SCREEN: ?Has anyone close to you tried to hurt or harm you recentlyo No ?Do you feel uncomfortable with anyone in your familyo No ?Has anyone forced you do things that you didnot want to doo No ?Electronic Signature(s) ?Signed: 04/10/2022 2:59:40 PM By: Hansel Feinstein ?Entered ByHansel Feinstein on 04/10/2022 13:50:44 ?Eckles, Derisha A. ([id number]) ?-------------------------------------------------------------------------------- ?Activities of Daily Living Details ?Patient Name: Sara [name], [name] ?Date of Service: 04/10/2022 1:45 PM ?Medical Record Number: [id number] ?Patient Account Number: [id number] ?Date of Birth/Sex: Sep 28, 1966 (56 y.o. F) ?Treating RN: Hansel Feinstein ?Primary Care Sheletha Bow: Meryl Crutch Other Clinician: ?Referring Senica Crall: STECHSCHULTE, Renae Fickle ?Treating Shone Leventhal/Extender: Maxwell Caul ?Weeks in Treatment: 0 ?Activities of Daily Living Items ?Answer ?Activities of Daily Living (Please select one for each item) ?Drive Automobile Need Assistance ?Take Medications Need Assistance ?Use Telephone Completely Able ?Care for Appearance Need Assistance ?Use Toilet Need Assistance ?Bath / Shower Need Assistance ?Dress Scheerer Need Assistance ?Feed Ryle Completely Able ?Walk Need Assistance ?Get In / Out Bed Need Assistance ?Housework Not Able ?Prepare Meals Need Assistance ?Handle Money Completely Able ?Shop for Arrellano Need Assistance ?Electronic Signature(s) ?Signed: 04/10/2022 2:59:40 PM By: Hansel Feinstein ?Entered ByHansel Feinstein on 04/10/2022 13:51:11 ?Ambrosia, Fahmida A. ([id number]) ?-------------------------------------------------------------------------------- ?Education Screening Details ?Patient Name: Sara [name], [name] ?Date of Service: 04/10/2022 1:45 PM ?Medical Record Number: [id number] ?Patient Account Number: [id number] ?Date of Birth/Sex: [birth year] (56 y.o. F) ?Treating RN: Hansel Feinstein ?Primary Care Aahil Fredin: Meryl Crutch Other Clinician: ?Referring Alyce Inscore: STECHSCHULTE, Renae Fickle ?Treating Marcena Dias/Extender: Maxwell Caul ?Weeks in Treatment: 0 ?Primary Learner Assessed: Patient ?Learning Preferences/Education Level/Primary Language ?Learning Preference: Explanation ?Highest Education Level: College or Above ?Preferred Language: English ?Cognitive Barrier ?Language Barrier: No ?Translator Needed: No ?Memory Deficit: No ?Emotional Barrier: No ?Cultural/Religious Beliefs Affecting Medical Care: No ?Physical Barrier ?Impaired Vision: No ?Impaired Hearing: No ?Decreased Hand dexterity: No ?Knowledge/Comprehension ?Knowledge Level: High ?Comprehension Level: High ?Ability to understand written instructions: High ?Ability to understand verbal instructions: High ?Motivation ?Anxiety Level: Calm ?Cooperation: Cooperative ?Education Importance: Acknowledges Need ?Interest in Health Problems: Asks Questions ?Perception: Coherent ?Willingness to Engage in Yawn-Management ?High ?Activities: ?Readiness to Engage in [place]-Management ?High ?Activities: ?Electronic Signature(s) ?Signed: 04/10/2022 2:59:40 PM By: Hansel Feinstein ?Entered ByHansel Feinstein on 04/10/2022 13:51:30 ?Oestreich, Todd A. ([id number]) ?-------------------------------------------------------------------------------- ?Fall Risk Assessment Details ?Patient Name: Sara [name], [name] ?Date of Service: 04/10/2022 1:45 PM ?Medical Record Number: [id number] ?Patient Account Number: [id number] ?Date of Birth/Sex: 07/25/1966 (56 y.o. F) ?Treating RN: Hansel Feinstein ?Primary Care  Veora Fonte: Meryl Crutch Other Clinician: ?Referring Gianah Batt: STECHSCHULTE, Renae Fickle ?Treating Davina Howlett/Extender: Maxwell Caul ?Weeks in Treatment: 0 ?Fall Risk Assessment Items ?Have you had 2 or more falls in the last 12 monthso 0 No ?Have you had any fall that resulted in injury in the last 12 monthso 0 No ?FALLS RISK SCREEN ?History of falling - immediate or within 3 months 0 No ?Secondary diagnosis (Do you have 2 or more medical diagnoseso) 0 No ?Ambulatory aid ?None/bed rest/wheelchair/nurse 0 No ?Crutches/cane/walker 15 Yes ?Furniture 0 No ?Intravenous therapy Access/Saline/Heparin Lock 0 No ?Gait/Transferring ?Normal/ bed rest/ wheelchair 0 No ?Weak (short steps  with or without shuffle, stooped but able to lift head while walking, may ?10 Yes ?seek support from furniture) ?Impaired (short steps with shuffle, may have difficulty arising from chair, head down, impaired ?0 No ?balance) ?Mental Status ?Oriented to own ability 0 Yes ?Electronic Signature(s) ?Signed: 04/10/2022 2:59:40 PM By: Hansel Feinstein ?Entered ByHansel Feinstein on 04/10/2022 13:52:06 ?Thurmond, Hadley A. ([id number]) ?-------------------------------------------------------------------------------- ?Foot Assessment Details ?Patient Name: Sara [name], [name] ?Date of Service: 04/10/2022 1:45 PM ?Medical Record Number: [id number] ?Patient Account Number: [id number] ?Date of Birth/Sex: 1966-01-20 (56 y.o. F) ?Treating RN: Hansel Feinstein ?Primary Care Lamar Naef: Meryl Crutch Other Clinician: ?Referring Liliani Bobo: STECHSCHULTE, Renae Fickle ?Treating Phyllistine Domingos/Extender: Maxwell Caul ?Weeks in Treatment: 0 ?Foot Assessment Items ?Site Locations ?+ = Sensation present, - = Sensation absent, C = Callus, U = Ulcer ?R = Redness, W = Warmth, M = Maceration, PU = Pre-ulcerative lesion ?F = Fissure, S = Swelling, D = Dryness ?Assessment ?Right: Left: ?Other Deformity: No No ?Prior Foot Ulcer: No No ?Prior Amputation: No No ?Charcot Joint: No No ?Ambulatory Status:  Ambulatory With Help ?Assistance Device: Walker ?Gait: ?Electronic Signature(s) ?Signed: 04/10/2022 2:59:40 PM By: Hansel Feinstein ?Entered ByHansel Feinstein on 04/10/2022 13:52:51 ?Sara [name], [name] ([id number]) ?-------------------------------------------------------------------------------- ?Nutrition Risk Screening Details ?Patient Name: Sara [name], [name] ?Date of Service: 04/10/2022 1:45 PM ?Medical Record Number: [id number] ?Patient Account Number: [id number] ?Date of Birth/Sex: January 20, 1966 (56 y.o. F) ?Treating RN: Hansel Feinstein ?Primary Care Danya Spearman: Meryl Crutch Other Clinician: ?Referring Marshawn Normoyle: STECHSCHULTE, Renae Fickle ?Treating Sheniqua Carolan/Extender: Maxwell Caul ?Weeks in Treatment: 0 ?Height (in): 65 ?Weight (lbs): 220 ?Body Mass Index (BMI): 36.6 ?Nutrition Risk Screening Items ?Score Screening ?NUTRITION RISK SCREEN: ?I have an illness or condition that made me change the kind and/or amount of food I eat 2 Yes ?I eat fewer than two meals per day 0 No ?I eat few fruits and vegetables, or milk products 0 No ?I have three or more drinks of beer, liquor or wine almost every day 0 No ?I have tooth or mouth problems that make it hard for me to eat 0 No ?I don't always have enough money to buy the food I need 0 No ?I eat alone most of the time 0 No ?I take three or more different prescribed or over-the-counter drugs a day 1 Yes ?Without wanting to, I have lost or gained 10 pounds in the last six months 0 No ?I am not always physically able to shop, cook and/or feed myself 0 No ?Nutrition Protocols ?Good Risk Protocol 0 No interventions needed ?Moderate Risk Protocol ?High Risk Proctocol ?Risk Level: Moderate Risk ?Score: 3 ?Electronic Signature(s) ?Signed: 04/10/2022 2:59:40 PM By: Hansel Feinstein ?Entered ByHansel Feinstein on 04/10/2022 13:52:22 ?
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
## 2022-04-11 NOTE — Progress Notes (Addendum)
Ketcham, Tomorrow [name]. ([id number]) ?Visit Report for 04/10/2022 ?Allergy List Details ?Patient Name: Sara [name], Sara [name]. ?Date of Service: 04/10/2022 1:45 PM ?Medical Record Number: [id number] ?Patient Account Number: [id number] ?Date of Birth/Sex: Oct 02, 1966 (56 y.o. F) ?Treating RN: Hansel Feinstein ?Primary Care Gorje Iyer: Meryl Crutch Other Clinician: ?Referring Branda Chaudhary: STECHSCHULTE, Renae Fickle ?Treating Davier Tramell/Extender: Maxwell Caul ?Weeks in Treatment: 0 ?Allergies ?Active Allergies ?Neosporin (neo-bac-polym) ?Reaction: rash ?Severity: Severe ?neomycin ?Reaction: rash ?Severity: Severe ?Sulfa (Sulfonamide Antibiotics) ?Reaction: nausea ?Severity: Mild ?Cipro ?Reaction: nausea ?gabapentin ?Augmentin ?Reaction: nausea ?azithromycin ?Reaction: nausea ?Allergy Notes ?Electronic Signature(Sara) ?Signed: 04/11/2022 8:50:37 AM By: Hansel Feinstein ?Previous Signature: 04/10/2022 2:59:40 PM Version By: Hansel Feinstein ?Entered ByHansel Feinstein on 04/11/2022 08:50:37 ?Stopka, Daanya [name]. ([id number]) ?-------------------------------------------------------------------------------- ?Arrival Information Details ?Patient Name: Sara [name], Sara [name]. ?Date of Service: 04/10/2022 1:45 PM ?Medical Record Number: [id number] ?Patient Account Number: [id number] ?Date of Birth/Sex: December 10, 1966 (56 y.o. F)F) ?Treating RN: Hansel Feinstein ?Primary Care Goldie Tregoning: Meryl Crutch Other Clinician: ?Referring Jayvian Escoe: STECHSCHULTE, Renae Fickle ?Treating Miri Jose/Extender: Maxwell Caul ?Weeks in Treatment: 0 ?Visit Information ?Patient Arrived: Wheel Chair ?Arrival Time: 13:42 ?Accompanied By: son ?Transfer Assistance: Manual ?Patient Identification Verified: Yes ?Secondary Verification Process Completed: Yes ?Patient Requires Transmission-Based Precautions: No ?Patient Has Alerts: Yes ?Patient Alerts: NOT diabetic ?Electronic Signature(Sara) ?Signed: 04/10/2022 2:59:40 PM By: Hansel Feinstein ?Entered ByHansel Feinstein on 04/10/2022 13:44:19 ?Sara [name], Sara [name].  ([id number]) ?-------------------------------------------------------------------------------- ?Clinic Level of Care Assessment Details ?Patient Name: Sara [name], Sara [name]. ?Date of Service: 04/10/2022 1:45 PM ?Medical Record Number: [id number] ?Patient Account Number: [id number] ?Date of Birth/Sex: 1966/03/25 (56 y.o. F) ?Treating RN: Hansel Feinstein ?Primary Care Kjuan Seipp: Meryl Crutch Other Clinician: ?Referring Gemayel Mascio: STECHSCHULTE, Renae Fickle ?Treating Savannha Welle/Extender: Maxwell Caul ?Weeks in Treatment: 0 ?Clinic Level of Care Assessment Items ?TOOL 2 Quantity Score ?[]  - Use when only an EandM is performed on the INITIAL visit 0 ?ASSESSMENTS - Nursing Assessment / Reassessment ?[]  - General Physical Exam (combine w/ comprehensive assessment (listed just below) when performed on new ?0 ?pt. evals) ?[]  - 0 ?Comprehensive Assessment (HX, ROS, Risk Assessments, Wounds Hx, etc.) ?ASSESSMENTS - Wound and Skin Assessment / Reassessment ?X - Simple Wound Assessment / Reassessment - one wound 1 5 ?[]  - 0 ?Complex Wound Assessment / Reassessment - multiple wounds ?[]  - 0 ?Dermatologic / Skin Assessment (not related to wound area) ?ASSESSMENTS - Ostomy and/or Continence Assessment and Care ?[]  - Incontinence Assessment and Management 0 ?[]  - 0 ?Ostomy Care Assessment and Management (repouching, etc.) ?PROCESS - Coordination of Care ?[]  - Simple Patient / Family Education for ongoing care 0 ?X- 1 20 ?Complex (extensive) Patient / Family Education for ongoing care ?X- 1 10 ?Staff obtains Consents, Records, Test Results / Process Orders ?X- 1 10 ?Staff telephones HHA, Nursing Homes / Clarify orders / etc ?[]  - 0 ?Routine Transfer to another Facility (non-emergent condition) ?[]  - 0 ?Routine Hospital Admission (non-emergent condition) ?X- 1 15 ?New Admissions / / Ordering NPWT, Apligraf, etc. ?[]  - 0 ?Emergency Hospital Admission (emergent condition) ?X- 1 10 ?Simple Discharge Coordination ?[]  - 0 ?Complex  (extensive) Discharge Coordination ?PROCESS - Special Needs ?[]  - Pediatric / Minor Patient Management 0 ?[]  - 0 ?Isolation Patient Management ?[]  - 0 ?Hearing / Language / Visual special needs ?[]  - 0 ?Assessment of Community assistance (transportation, D/C planning, etc.) ?[]  - 0 ?Additional assistance / Altered mentation ?[]  - 0 ?Support Surface(Sara) Assessment (bed, cushion, seat, etc.) ?INTERVENTIONS - Wound Cleansing / Measurement ?X - Wound Imaging (photographs - any number of wounds)  1 5 ?[]  - 0 ?Wound Tracing (instead of photographs) ?X- 1 5 ?Simple Wound Measurement - one wound ?[]  - 0 ?Complex Wound Measurement - multiple wounds ?Sara [name], Sara [name]. ( ) ?X- 1 5 ?Simple Wound Cleansing - one wound ?[]  - 0 ?Complex Wound Cleansing - multiple wounds ?INTERVENTIONS - Wound Dressings ?[]  - Small Wound Dressing one or multiple wounds 0 ?[]  - 0 ?Medium Wound Dressing one or multiple wounds ?X- 1 20 ?Large Wound Dressing one or multiple wounds ?[]  - 0 ?Application of Medications - injection ?INTERVENTIONS - Miscellaneous ?[]  - External ear exam 0 ?[]  - 0 ?Specimen Collection (cultures, biopsies, blood, body fluids, etc.) ?[]  - 0 ?Specimen(Sara) / Culture(Sara) sent or taken to Lab for analysis ?[]  - 0 ?Patient Transfer (multiple staff / / Similar devices) ?[]  - 0 ?Simple Staple / Suture removal (25 or less) ?[]  - 0 ?Complex Staple / Suture removal (26 or more) ?[]  - 0 ?Hypo / Hyperglycemic Management (close monitor of Blood Glucose) ?[]  - 0 ?Ankle / Brachial Index (ABI) - do not check if billed separately ?Has the patient been seen at the hospital within the last three years: Yes ?Total Score: 105 ?Level Of Care: New/Established - Level ?3 ?Electronic Signature(Sara) ?Signed: 04/10/2022 2:59:40 PM By: ?Entered By on 04/10/2022 14:57:35 ?Sara [name], Sara [name]. ( ) ?-------------------------------------------------------------------------------- ?Encounter Discharge Information  Details ?Patient Name: Sara [name], Sara [name]. ?Date of Service: 04/10/2022 1:45 PM ?Medical Record Number: ?Patient Account Number: ?Date of Birth/Sex: 11-26-1966 (56 y.o. F) ?Treating RN: ?Primary Care Jehan Bonano: Other Clinician: ?Referring Afreen Siebels: STECHSCHULTE, ?Treating Zaidan Keeble/Extender: ?Weeks in Treatment: 0 ?Encounter Discharge Information Items ?Discharge Condition: Stable ?Ambulatory Status: Wheelchair ?Discharge Destination: Home ?Transportation: Private Auto ?Accompanied By: son ?Schedule Follow-up Appointment: Yes ?Clinical Summary of Care: ?Electronic Signature(Sara) ?Signed: 04/10/2022 2:59:40 PM By: Hansel Feinstein ?Entered ByHansel Feinstein on 04/10/2022 14:58:49 ?Sara [name], Sara [name]. ([id number]) ?-------------------------------------------------------------------------------- ?Lower Extremity Assessment Details ?Patient Name: Sara [name], Sara [name]. ?Date of Service: 04/10/2022 1:45 PM ?Medical Record Number: [id number] ?Patient Account Number: [id number] ?Date of Birth/Sex: 02/28/1966 (56 y.o. F) ?Treating RN: Hansel Feinstein ?Primary Care Kellis Mcadam: Meryl Crutch Other Clinician: ?Referring Emry Tobin: STECHSCHULTE, Renae Fickle ?Treating Shi Grose/Extender: Maxwell Caul ?Weeks in Treatment: 0 ?Electronic Signature(Sara) ?Signed: 04/10/2022 2:59:40 PM By: Hansel Feinstein ?Entered ByHansel Feinstein on 04/10/2022 13:53:43 ?Sara [name], Sara [name]. ([id number]) ?-------------------------------------------------------------------------------- ?Multi Wound Chart Details ?Patient Name: Sara [name], Sara [name]. ?Date of Service: 04/10/2022 1:45 PM ?Medical Record Number: [id number] ?Patient Account Number: [id number] ?Date of Birth/Sex: 11-Mar-1966 (56 y.o. F) ?Treating RN: Hansel Feinstein ?Primary Care Jillane Po: Meryl Crutch Other Clinician: ?Referring Ceana Fiala: STECHSCHULTE, Renae Fickle ?Treating Jakaleb Payer/Extender: Maxwell Caul ?Weeks in Treatment: 0 ?Vital Signs ?Height(in): 65 ?Pulse(bpm):  82 ?Weight(lbs): 220 ?Blood Pressure(mmHg): 101/68 ?Body Mass Index(BMI): 36.6 ?Temperature(??F): 98.5 ?Respiratory Rate(breaths/min): 16 ?Photos: [N/[name]:N/[name]] ?Wound Location: Abdomen - midline N/[name] N/[name] ?Wounding Event: Surgical Injury N/[name] N/[name]

## 2022-04-12 DIAGNOSIS — S31103A Unspecified open wound of abdominal wall, right lower quadrant without penetration into peritoneal cavity, initial encounter: Secondary | ICD-10-CM | POA: Diagnosis not present

## 2022-04-12 DIAGNOSIS — L98495 Non-pressure chronic ulcer of skin of other sites with muscle involvement without evidence of necrosis: Secondary | ICD-10-CM | POA: Diagnosis not present

## 2022-04-12 DIAGNOSIS — T8131XA Disruption of external operation (surgical) wound, not elsewhere classified, initial encounter: Secondary | ICD-10-CM | POA: Diagnosis not present

## 2022-04-12 NOTE — Progress Notes (Signed)
Jacot, [name] ([id number]) ?Visit Report for 04/12/2022 ?Arrival Information Details ?Patient Name: Sara [name], [name] ?Date of Service: 04/12/2022 1:45 PM ?Medical Record Number: [id number] ?Patient Account Number: [id number] ?Date of Birth/Sex: 11/17/1966 (56 y.o. F) ?Treating RN: Hansel Feinstein ?Primary Care Weslee Prestage: Meryl Crutch Other Clinician: ?Referring Muadh Creasy: Meryl Crutch ?Treating Mack Thurmon/Extender: Allen Derry ?Weeks in Treatment: 0 ?Visit Information History Since Last Visit ?Added or deleted any medications: No ?Patient Arrived: Wheel Chair ?Had a fall or experienced change in No ?Arrival Time: 13:46 ?activities of daily living that may affect ?Accompanied By: son ?risk of falls: ?Transfer Assistance: EasyP[employer] Patient ?Hospitalized since last visit: No ?Lift ?Has Dressing in Place as Prescribed: Yes ?Patient Identification Verified: Yes ?Pain Present Now: Yes ?Secondary Verification Process Completed: Yes ?Patient Requires Transmission-Based No ?Precautions: ?Patient Has Alerts: Yes ?Patient Alerts: NOT diabetic ?Electronic Signature(s) ?Signed: 04/12/2022 4:44:00 PM By: Hansel Feinstein ?Entered ByHansel Feinstein on 04/12/2022 13:47:02 ?Cozort, Kaysen A. ([id number]) ?-------------------------------------------------------------------------------- ?Encounter Discharge Information Details ?Patient Name: Sara [name], [name] ?Date of Service: 04/12/2022 1:45 PM ?Medical Record Number: [id number] ?Patient Account Number: [id number] ?Date of Birth/Sex: 06/19/1966 (56 y.o. F) ?Treating RN: Hansel Feinstein ?Primary Care Lovella Hardie: Meryl Crutch Other Clinician: ?Referring Sunny Gains: Meryl Crutch ?Treating Corydon Schweiss/Extender: Allen Derry ?Weeks in Treatment: 0 ?Encounter Discharge Information Items ?Discharge Condition: Stable ?Ambulatory Status: Wheelchair ?Discharge Destination: Home ?Transportation: Private Auto ?Accompanied By: son ?Schedule Follow-up Appointment: Yes ?Clinical Summary of Care: ?Electronic  Signature(s) ?Signed: 04/12/2022 4:44:00 PM By: Hansel Feinstein ?Entered ByHansel Feinstein on 04/12/2022 14:23:14 ?Meloni, Haislee A. ([id number]) ?-------------------------------------------------------------------------------- ?Negative Pressure Wound Therapy Maintenance (NPWT) Details ?Patient Name: Sara [name], [name] ?Date of Service: 04/12/2022 1:45 PM ?Medical Record Number: [id number] ?Patient Account Number: [id number] ?Date of Birth/Sex: 23-Sep-1966 (56 y.o. F) ?Treating RN: Hansel Feinstein ?Primary Care Betsie Peckman: Meryl Crutch Other Clinician: ?Referring Janesa Dockery: Meryl Crutch ?Treating Courtany Mcmurphy/Extender: Allen Derry ?Weeks in Treatment: 0 ?NPWT Maintenance Performed for: Wound #1 Abdomen - midline ?Performed By: Hansel Feinstein, RN ?Type: Other ?Coverage Size (sq cm): 131.25 ?Pressure Type: Constant ?Pressure Setting: 125 mmHG ?Drain Type: None ?Primary Contact: ?Other : mepitel ?Sponge/Dressing Type: Foam, Black ?Date Initiated: 04/10/2022 ?Dressing Removed: Yes ?Quantity of Sponges/Gauze Removed: 1 ?Canister Changed: Yes ?Canister Exudate Volume: 50 ?Dressing Reapplied: Yes ?Quantity of Sponges/Gauze Inserted: 1 ?Respones To Treatment: tolerated well ?Days On NPWT: 3 ?Electronic Signature(s) ?Signed: 04/12/2022 4:44:00 PM By: Hansel Feinstein ?Entered ByHansel Feinstein on 04/12/2022 14:20:20 ?Sara [name], [name] ([id number]) ?-------------------------------------------------------------------------------- ?Wound Assessment Details ?Patient Name: Sara [name], [name] ?Date of Service: 04/12/2022 1:45 PM ?Medical Record Number: [id number] ?Patient Account Number: [id number] ?Date of Birth/Sex: [birth year] (56 y.o. F) ?Treating RN: Hansel Feinstein ?Primary Care Jaydrien Wassenaar: Meryl Crutch Other Clinician: ?Referring Arrian Manson: Meryl Crutch ?Treating [place]/Extender: Allen Derry ?Weeks in Treatment: 0 ?Wound Status ?Wound Number: 1 ?Primary Etiology: Open Surgical Wound ?Wound Location: Abdomen - midline ?Wound Status: Open ?Wounding Event:  Surgical Injury ?Comorbid History: Asthma, Arrhythmia ?Date Acquired: 03/25/2022 ?Weeks Of Treatment: 0 ?Clustered Wound: No ?Photos ?Wound Measurements ?Length: (cm) 10.5 ?Width: (cm) 12.5 ?Depth: (cm) 1.4 ?Area: (cm?) 103.084 ?Volume: (cm?) 144.317 ?% Reduction in Area: 0% ?% Reduction in Volume: 0% ?Wound Description ?Classification: Full Thickness With Exposed Support Structures ?Exudate Amount: Large ?Exudate Type: Serosanguineous ?Exudate Color: red, brown ?Foul Odor After Cleansing: No ?Slough/Fibrino Yes ?Wound Bed ?Granulation Amount: Large (67-100%) Exposed Structure ?Granulation Quality: Red, Pink ?Fascia Exposed: No ?Necrotic Amount: Small (1-33%) ?Fat Layer (Subcutaneous Tissue) Exposed: Yes ?Necrotic Quality: Adherent Slough ?Tendon Exposed: No ?Muscle Exposed: Yes ?Necrosis of Muscle: No ?  Joint Exposed: No ?Bone Exposed: No ?Treatment Notes ?Wound #1 (Abdomen - midline) ?Cleanser ?Normal Saline ?Discharge Instruction: Wash your hands with soap and water. Remove old dressing, discard into plastic bag and place into trash. ?Cleanse the wound with Normal Saline prior to applying a clean dressing using gauze sponges, not tissues or cotton balls. Do not ?scrub or use excessive force. Pat dry using gauze sponges, not tissue or cotton balls. ?Sara [name], [name] ([id number]) ?Soap and Water ?Discharge Instruction: Gently cleanse wound with antibacterial soap, rinse and pat dry prior to dressing wounds ?Wound Cleanser ?Discharge Instruction: Wash your hands with soap and water. Remove old dressing, discard into plastic bag and place into trash. ?Cleanse the wound with Wound Cleanser prior to applying a clean dressing using gauze sponges, not tissues or cotton balls. Do ?not scrub or use excessive force. Pat dry using gauze sponges, not tissue or cotton balls. ?Peri-Wound Care ?Topical ?Primary Dressing ?Gauze ?Discharge Instruction: As directed: moistened with saline or moistened with Dakins Solution ?Secondary  Dressing ?ABD Pad 5x9 (in/in) ?Discharge Instruction: Cover with ABD pad ?Secured With ?Medipore Tape - 101M Medipore H Soft Cloth Surgical Tape, 2x2 (in/yd) ?Compression Wrap ?Compression Stockings ?Add-Ons ?Electronic Signature(s) ?Signed: 04/12/2022 4:44:00 PM By: Hansel Feinstein ?Entered ByHansel Feinstein on 04/12/2022 14:18:09 ?

## 2022-04-12 NOTE — Progress Notes (Signed)
Pritchard, Naileah A. ([id number]) ?Visit Report for 04/12/2022 ?Physician Orders Details ?Patient Name: Sara [name], [name] ?Date of Service: 04/12/2022 1:45 PM ?Medical Record Number: [id number] ?Patient Account Number: [id number] ?Date of Birth/Sex: 1966-07-04 (56 y.o. F) ?Treating RN: Hansel Feinstein ?Primary Care Provider: Meryl Crutch Other Clinician: ?Referring Provider: Meryl Crutch ?Treating Provider/Extender: Allen Derry ?Weeks in Treatment: 0 ?Verbal / Phone Orders: No ?Diagnosis Coding ?Follow-up Appointments ?o Return Appointment in 1 week. - provider ?o Return Appointment in: - Tues and Friday wound vac maintenance ?Bathing/ Shower/ Hygiene ?o No tub bath. ?Additional Orders / Instructions ?o Follow Nutritious Diet and Increase Protein Intake ?o Other: - Continue to wear recommended abdominal binder ?ABD pads to cover sutures above and below wound ?Negative Pressure Wound Therapy ?o Wound VAC settings at continuous pressure. Use foam to wound cavity. Please order WHITE foam to fill any ?tunnel/s and/or undermining when necessary. Change VAC dressing 2 X WEEK. Change canister as indicated when full. ?o Apply contact layer over base of wound. - mepitel over sutures in wound ?o Other: - If wound vac not functioning over two hours-remove and apply wet to dry saline dressing daily until wound vac replaced; call for ?nurse visit ?Wound Treatment ?Wound #1 - Abdomen - midline ?Cleanser: Normal Saline ?Discharge Instructions: Wash your hands with soap and water. Remove old dressing, discard into plastic bag and place into trash. ?Cleanse the wound with Normal Saline prior to applying a clean dressing using gauze sponges, not tissues or cotton balls. Do not scrub ?or use excessive force. Pat dry using gauze sponges, not tissue or cotton balls. ?Cleanser: Soap and Water ?Discharge Instructions: Gently cleanse wound with antibacterial soap, rinse and pat dry prior to dressing wounds ?Cleanser:  Wound Cleanser ?Discharge Instructions: Wash your hands with soap and water. Remove old dressing, discard into plastic bag and place into trash. ?Cleanse the wound with Wound Cleanser prior to applying a clean dressing using gauze sponges, not tissues or cotton balls. Do not ?scrub or use excessive force. Pat dry using gauze sponges, not tissue or cotton balls. ?Primary Dressing: Gauze ?Discharge Instructions: As directed: moistened with saline or moistened with Dakins Solution ?Secondary Dressing: ABD Pad 5x9 (in/in) ?Discharge Instructions: Cover with ABD pad ?Secured With: Medipore Tape - 9M Medipore H Soft Cloth Surgical Tape, 2x2 (in/yd) ?Electronic Signature(s) ?Signed: 04/12/2022 4:44:00 PM By: Hansel Feinstein ?Signed: 04/12/2022 4:44:12 PM By: Lenda Kelp PA-C ?Entered ByHansel Feinstein on 04/12/2022 14:22:49 ?Sara [name], [name] ([id number]) ?-------------------------------------------------------------------------------- ?SuperBill Details ?Patient Name: Sara [name], [name] ?Date of Service: 04/12/2022 ?Medical Record Number: [id number] ?Patient Account Number: [id number] ?Date of Birth/Sex: 06-03-1966 (56 y.o. F) ?Treating RN: Hansel Feinstein ?Primary Care Provider: Meryl Crutch Other Clinician: ?Referring Provider: Meryl Crutch ?Treating Provider/Extender: Allen Derry ?Weeks in Treatment: 0 ?Diagnosis Coding ?ICD-10 Codes ?Code Description ?T81.31XD Disruption of external operation (surgical) wound, not elsewhere classified, subsequent encounter ?Unspecified open wound of abdominal wall, right lower quadrant without penetration into peritoneal cavity, subsequent ?S31.103D ?encounter ?Facility Procedures ?CPT4: Description Modifier Quantity Code [id number] 97606 - WOUND VAC-GREATER TH 50 SQ CM 1 ?CPT4: ICD-10 Diagnosis Description S31.103D Unspecified open wound of abdominal wall, right lower quadrant without penetration into peritoneal cavity, subsequent encounter ?Physician Procedures ?CPT4: Description Modifier  Quantity Code [id number] 97606 - WC PHYS TX WOUND VAC>50 SQ CM 1 ?CPT4: ICD-10 Diagnosis Description S31.103D Unspecified open wound of abdominal wall, right lower quadrant without penetration into peritoneal cavity, subsequent encounter ?Electronic Signature(s) ?Signed: 04/12/2022 4:44:00 PM By: Hansel Feinstein ?Signed: 04/12/2022  4:44:12 PM By: Lenda Kelp PA-C ?Entered ByHansel Feinstein on 04/12/2022 14:23:31 ?

## 2022-04-15 ENCOUNTER — Ambulatory Visit: Payer: Medicaid Other

## 2022-04-16 ENCOUNTER — Encounter: Payer: Medicaid Other | Admitting: Physician Assistant

## 2022-04-16 DIAGNOSIS — L98495 Non-pressure chronic ulcer of skin of other sites with muscle involvement without evidence of necrosis: Secondary | ICD-10-CM | POA: Diagnosis not present

## 2022-04-16 DIAGNOSIS — T8131XA Disruption of external operation (surgical) wound, not elsewhere classified, initial encounter: Secondary | ICD-10-CM | POA: Diagnosis not present

## 2022-04-16 DIAGNOSIS — S31103A Unspecified open wound of abdominal wall, right lower quadrant without penetration into peritoneal cavity, initial encounter: Secondary | ICD-10-CM | POA: Diagnosis not present
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
## 2022-04-16 NOTE — Progress Notes (Signed)
[name], [name] ([id number]) ?Visit Report for 04/16/2022 ?Arrival Information Details ?Patient Name: Sara [name], [name] ?Date of Service: 04/16/2022 1:30 PM ?Medical Record Number: [id number] ?Patient Account Number: [id number] ?Date of Birth/Sex: [birth year] (56 y.o. F) ?Treating RN: Huel Coventry ?Primary Care Lorann Tani: Meryl Crutch Other Clinician: ?Referring Byren Pankow: Meryl Crutch ?Treating Bonnetta Allbee/Extender: Allen Derry ?Weeks in Treatment: 0 ?Visit Information History Since Last Visit ?Added or deleted any medications: No ?Patient Arrived: Wheel Chair ?Has Dressing in Place as Prescribed: Yes ?Arrival Time: 13:38 ?Pain Present Now: Yes ?Accompanied By: sister, Alvino Chapel ?Transfer Assistance: None ?Patient Identification Verified: Yes ?Secondary Verification Process Completed: Yes ?Patient Requires Transmission-Based Precautions: No ?Patient Has Alerts: Yes ?Patient Alerts: NOT diabetic ?Electronic Signature(s) ?Signed: 04/16/2022 3:20:38 PM By: Elliot Gurney, BSN, RN, CWS, Kim RN, BSN ?Entered By: Elliot Gurney, BSN, RN, CWS, Kim on 04/16/2022 13:39:39 ?Milburn, Gala A. ([id number]) ?-------------------------------------------------------------------------------- ?Lower Extremity Assessment Details ?Patient Name: [name], [name] ?Date of Service: 04/16/2022 1:30 PM ?Medical Record Number: [id number] ?Patient Account Number: [id number] ?Date of Birth/Sex: 05-13-1966 (56 y.o. F) ?Treating RN: Huel Coventry ?Primary Care Sidi Dzikowski: Meryl Crutch Other Clinician: ?Referring Jaben Benegas: Meryl Crutch ?Treating Mackena Plummer/Extender: Allen Derry ?Weeks in Treatment: 0 ?Electronic Signature(s) ?Signed: 04/16/2022 2:42:56 PM By: Elliot Gurney, BSN, RN, CWS, Kim RN, BSN ?Entered By: Elliot Gurney, BSN, RN, CWS, Kim on 04/16/2022 14:42:56 ?Riggan, Noelle A. ([id number]) ?-------------------------------------------------------------------------------- ?Multi Wound Chart Details ?Patient Name: [name], [name] ?Date of Service: 04/16/2022 1:30 PM ?Medical Record  Number: [id number] ?Patient Account Number: [id number] ?Date of Birth/Sex: 1966-12-15 (56 y.o. F) ?Treating RN: Huel Coventry ?Primary Care Mirtha Jain: Meryl Crutch Other Clinician: ?Referring Colletta Spillers: Meryl Crutch ?Treating Kristian Mogg/Extender: Allen Derry ?Weeks in Treatment: 0 ?Vital Signs ?Height(in): 65 ?Pulse(bpm): 81 ?Weight(lbs): 220 ?Blood Pressure(mmHg): 106/71 ?Body Mass Index(BMI): 36.6 ?Temperature(??F): 98.4 ?Respiratory Rate(breaths/min): 18 ?Photos: [N/A:N/A] ?Wound Location: Abdomen - midline N/A N/A ?Wounding Event: Surgical Injury N/A N/A ?Primary Etiology: Open Surgical Wound N/A N/A ?Comorbid History: Asthma, Arrhythmia N/A N/A ?Date Acquired: 03/25/2022 N/A N/A ?Weeks of Treatment: 0 N/A N/A ?Wound Status: Open N/A N/A ?Wound Recurrence: No N/A N/A ?Measurements L x W x D (cm) 10.3x13x1.4 N/A N/A ?Area (cm?) : 105.165 N/A N/A ?Volume (cm?) : 147.231 N/A N/A ?% Reduction in Area: -2.00% N/A N/A ?% Reduction in Volume: -2.00% N/A N/A ?Position 1 (o'clock): 12 ?Maximum Distance 1 (cm): 0.9 ?Tunneling: Yes N/A N/A ?Classification: Full Thickness With Exposed N/A N/A ?Support Structures ?Exudate Amount: Large N/A N/A ?Exudate Type: Serosanguineous N/A N/A ?Exudate Color: red, brown N/A N/A ?Granulation Amount: Large (67-100%) N/A N/A ?Granulation Quality: Red N/A N/A ?Necrotic Amount: Small (1-33%) N/A N/A ?Exposed Structures: ?Fat Layer (Subcutaneous Tissue): N/A N/A ?Yes ?Muscle: Yes ?Fascia: No ?Tendon: No ?Joint: No ?Bone: No ?Treatment Notes ?Electronic Signature(s) ?Signed: 04/16/2022 2:52:54 PM By: Elliot Gurney, BSN, RN, CWS, Kim RN, BSN ?Entered By: Elliot Gurney, BSN, RN, CWS, Kim on 04/16/2022 14:52:53 ?Kroeker, Shequilla A. ([id number]) ?-------------------------------------------------------------------------------- ?Multi-Disciplinary Care Plan Details ?Patient Name: [name], [name] ?Date of Service: 04/16/2022 1:30 PM ?Medical Record Number: [id number] ?Patient Account Number: [id number] ?Date of Birth/Sex:  09/26/1966 (56 y.o. F) ?Treating RN: Huel Coventry ?Primary Care Tiena Manansala: Meryl Crutch Other Clinician: ?Referring Hailei Besser: Meryl Crutch ?Treating Kynzie Polgar/Extender: Allen Derry ?Weeks in Treatment: 0 ?Active Inactive ?Orientation to the Wound Care Program ?Nursing Diagnoses: ?Knowledge deficit related to the wound healing center program ?Goals: ?Patient/caregiver will verbalize understanding of the Wound Healing Center Program ?Date Initiated: 04/10/2022 ?Target Resolution Date: 05/03/2022 ?Goal Status: Active ?Interventions: ?Provide education on orientation to the wound center ?Notes: ?Soft Tissue  Infection ?Nursing Diagnoses: ?Impaired tissue integrity ?Knowledge deficit related to disease process and management ?Knowledge deficit related to home infection control: handwashing, handling of soiled dressings, supply storage ?Potential for infection: soft tissue ?Goals: ?Patient will remain free of wound infection ?Date Initiated: 04/10/2022 ?Target Resolution Date: 05/10/2022 ?Goal Status: Active ?Patient/caregiver will verbalize understanding of or measures to prevent infection and contamination in the home setting ?Date Initiated: 04/10/2022 ?Target Resolution Date: 05/03/2022 ?Goal Status: Active ?Signs and symptoms of infection will be recognized early to allow for prompt treatment ?Date Initiated: 04/10/2022 ?Target Resolution Date: 05/17/2022 ?Goal Status: Active ?Interventions: ?Assess signs and symptoms of infection every visit ?Provide education on infection ?Treatment Activities: ?Education provided on Infection : 04/10/2022 ?Notes: ?Electronic Signature(s) ?Signed: 04/16/2022 2:43:09 PM By: Elliot Gurney, BSN, RN, CWS, Kim RN, BSN ?Entered By: Elliot Gurney, BSN, RN, CWS, Kim on 04/16/2022 14:43:08 ?Pint, Lalla A. ([id number]) ?-------------------------------------------------------------------------------- ?Negative Pressure Wound Therapy Maintenance (NPWT) Details ?Patient Name: Hoefling, Triva A. ?Date of Service:  04/16/2022 1:30 PM ?Medical Record Number: [id number] ?Patient Account Number: [id number] ?Date of Birth/Sex: 07-04-1966 (55 y.o. F) ?Treating RN: Huel Coventry ?Primary Care Malakai Schoenherr: Meryl Crutch Other Clinician: ?Referring Stamatia Masri: Meryl Crutch ?Treating Sarabi Sockwell/Extender: Allen Derry ?Weeks in Treatment: 0 ?NPWT Maintenance Performed for: Wound #1 Abdomen - midline ?Performed By: Huel Coventry, RN ?Type: Other ?Coverage Size (sq cm): 133.9 ?Pressure Type: Constant ?Pressure Setting: 125 mmHG ?Drain Type: None ?Primary Contact: ?Other : contact layer ?Sponge/Dressing Type: Foam, Black ?Date Initiated: 04/10/2022 ?Dressing Removed: Yes ?Quantity of Sponges/Gauze Removed: 1 ?Canister Changed: Yes ?Canister Exudate Volume: 50 ?Dressing Reapplied: Yes ?Quantity of Sponges/Gauze Inserted: 1 ?Respones To Treatment: tolerated well ?Days On NPWT: 7 ?Post Procedure Diagnosis ?Same as Pre-procedure ?Electronic Signature(s) ?Signed: 04/16/2022 2:54:43 PM By: Elliot Gurney, BSN, RN, CWS, Kim RN, BSN ?Entered By: Elliot Gurney, BSN, RN, CWS, Kim on 04/16/2022 14:54:43 ?Elsbury, Zonya A. ([id number]) ?-------------------------------------------------------------------------------- ?Pain Assessment Details ?Patient Name: Spindler, Cyana A. ?Date of Service: 04/16/2022 1:30 PM ?Medical Record Number: [id number] ?Patient Account Number: [id number] ?Date of Birth/Sex: 1966/12/10 (56 y.o. F) ?Treating RN: Huel Coventry ?Primary Care Brenleigh Collet: Meryl Crutch Other Clinician: ?Referring Erdem Naas: Meryl Crutch ?Treating Cristan Hout/Extender: Allen Derry ?Weeks in Treatment: 0 ?Active Problems ?Location of Pain Severity and Description of Pain ?Patient Has Paino Yes ?Site Locations ?Pain Location: ?Generalized Pain ?With Dressing Change: Yes ?Rate the pain. ?Current Pain Level: 7 ?Character of Pain ?Describe the Pain: Sharp, Throbbing ?Pain Management and Medication ?Current Pain Management: ?Medication: Yes ?Massage: Yes ?Electronic Signature(s) ?Signed:  04/16/2022 3:20:38 PM By: Elliot Gurney, BSN, RN, CWS, Kim RN, BSN ?Entered By: Elliot Gurney, BSN, RN, CWS, Kim on 04/16/2022 13:42:23 ?Kamp, Sanyia A. ([id number]) ?---------------------------------------------------------------

## 2022-04-17 ENCOUNTER — Ambulatory Visit: Payer: Medicaid Other

## 2022-04-18 DIAGNOSIS — S31103A Unspecified open wound of abdominal wall, right lower quadrant without penetration into peritoneal cavity, initial encounter: Secondary | ICD-10-CM | POA: Diagnosis not present

## 2022-04-18 DIAGNOSIS — L98495 Non-pressure chronic ulcer of skin of other sites with muscle involvement without evidence of necrosis: Secondary | ICD-10-CM | POA: Diagnosis not present

## 2022-04-18 DIAGNOSIS — T8131XA Disruption of external operation (surgical) wound, not elsewhere classified, initial encounter: Secondary | ICD-10-CM | POA: Diagnosis not present

## 2022-04-18 NOTE — Progress Notes (Signed)
Geers, Clarece [name]. ([id number]) ?Visit Report for 04/18/2022 ?Arrival Information Details ?Patient Name: Sara [name], Sara [name]. ?Date of Service: 04/18/2022 3:00 PM ?Medical Record Number: [id number] ?Patient Account Number: [id number] ?Date of Birth/Sex: 02/08/1966 (56 y.o. F) ?Treating RN: Angelina Pih ?Primary Care Zarie Kosiba: Meryl Crutch Other Clinician: ?Referring Lareta Bruneau: Meryl Crutch ?Treating Lounell Schumacher/Extender: Allen Derry ?Weeks in Treatment: 1 ?Visit Information History Since Last Visit ?Added or deleted any medications: No ?Patient Arrived: Wheel Chair ?Any new allergies or adverse reactions: No ?Arrival Time: 14:52 ?Had [name] fall or experienced change in No ?Accompanied By: family ?activities of daily living that may affect ?Transfer Assistance: EasyPivot Patient ?risk of falls: ?Lift ?Signs or symptoms of abuse/neglect since last visito No ?Patient Identification Verified: Yes ?Hospitalized since last visit: No ?Secondary Verification Process Completed: Yes ?Has Dressing in Place as Prescribed: Yes ?Patient Requires Transmission-Based No ?Pain Present Now: Yes ?Precautions: ?Patient Has Alerts: Yes ?Patient Alerts: NOT diabetic ?Electronic Signature(s) ?Signed: 04/18/2022 4:20:03 PM By: Angelina Pih ?Entered By: Angelina Pih on 04/18/2022 14:54:51 ?Seder, Lenetta [name]. ([id number]) ?-------------------------------------------------------------------------------- ?Clinic Level of Care Assessment Details ?Patient Name: Sara [name], Sara [name]. ?Date of Service: 04/18/2022 3:00 PM ?Medical Record Number: [id number] ?Patient Account Number: [id number] ?Date of Birth/Sex: 1966-03-01 (56 y.o. F) ?Treating RN: Angelina Pih ?Primary Care Nahjae Hoeg: Meryl Crutch Other Clinician: ?Referring Jahmari Esbenshade: Meryl Crutch ?Treating Khloi Rawl/Extender: Allen Derry ?Weeks in Treatment: 1 ?Clinic Level of Care Assessment Items ?TOOL 4 Quantity Score ?[]  - Use when only an EandM is performed on FOLLOW-UP visit 0 ?ASSESSMENTS -  Nursing Assessment / Reassessment ?X - Reassessment of Co-morbidities (includes updates in patient status) 1 10 ?X- 1 5 ?Reassessment of Adherence to Treatment Plan ?ASSESSMENTS - Wound and Skin Assessment / Reassessment ?[]  - Simple Wound Assessment / Reassessment - one wound 0 ?X- 1 5 ?Complex Wound Assessment / Reassessment - multiple wounds ?[]  - 0 ?Dermatologic / Skin Assessment (not related to wound area) ?ASSESSMENTS - Focused Assessment ?[]  - Circumferential Edema Measurements - multi extremities 0 ?[]  - 0 ?Nutritional Assessment / Counseling / Intervention ?[]  - 0 ?Lower Extremity Assessment (monofilament, tuning fork, pulses) ?[]  - 0 ?Peripheral Arterial Disease Assessment (using hand held doppler) ?ASSESSMENTS - Ostomy and/or Continence Assessment and Care ?[]  - Incontinence Assessment and Management 0 ?[]  - 0 ?Ostomy Care Assessment and Management (repouching, etc.) ?PROCESS - Coordination of Care ?X - Simple Patient / Family Education for ongoing care 1 15 ?[]  - 0 ?Complex (extensive) Patient / Family Education for ongoing care ?[]  - 0 ?Staff obtains Consents, Records, Test Results / Process Orders ?[]  - 0 ?Staff telephones HHA, Nursing Homes / Clarify orders / etc ?[]  - 0 ?Routine Transfer to another Facility (non-emergent condition) ?[]  - 0 ?Routine Hospital Admission (non-emergent condition) ?[]  - 0 ?New Admissions / / Ordering NPWT, Apligraf, etc. ?[]  - 0 ?Emergency Hospital Admission (emergent condition) ?X- 1 10 ?Simple Discharge Coordination ?[]  - 0 ?Complex (extensive) Discharge Coordination ?PROCESS - Special Needs ?[]  - Pediatric / Minor Patient Management 0 ?[]  - 0 ?Isolation Patient Management ?[]  - 0 ?Hearing / Language / Visual special needs ?[]  - 0 ?Assessment of Community assistance (transportation, D/C planning, etc.) ?[]  - 0 ?Additional assistance / Altered mentation ?[]  - 0 ?Support Surface(s) Assessment (bed, cushion, seat, etc.) ?INTERVENTIONS - Wound  Cleansing / Measurement ?Wollschlager, Vitoria [name]. ( ) ?X- 1 5 ?Simple Wound Cleansing - one wound ?[]  - 0 ?Complex Wound Cleansing - multiple wounds ?[]  - 0 ?Wound Imaging (photographs - any number of wounds) ?[]  -  0 ?Wound Tracing (instead of photographs) ?[]  - 0 ?Simple Wound Measurement - one wound ?[]  - 0 ?Complex Wound Measurement - multiple wounds ?INTERVENTIONS - Wound Dressings ?[]  - Small Wound Dressing one or multiple wounds 0 ?X- 1 15 ?Medium Wound Dressing one or multiple wounds ?[]  - 0 ?Large Wound Dressing one or multiple wounds ?[]  - 0 ?Application of Medications - topical ?[]  - 0 ?Application of Medications - injection ?INTERVENTIONS - Miscellaneous ?[]  - External ear exam 0 ?[]  - 0 ?Specimen Collection (cultures, biopsies, blood, body fluids, etc.) ?[]  - 0 ?Specimen(s) / Culture(s) sent or taken to Lab for analysis ?[]  - 0 ?Patient Transfer (multiple staff / / Similar devices) ?[]  - 0 ?Simple Staple / Suture removal (25 or less) ?[]  - 0 ?Complex Staple / Suture removal (26 or more) ?[]  - 0 ?Hypo / Hyperglycemic Management (close monitor of Blood Glucose) ?[]  - 0 ?Ankle / Brachial Index (ABI) - do not check if billed separately ?[]  - 0 ?Vital Signs ?Has the patient been seen at the hospital within the last three years: Yes ?Total Score: 65 ?Level Of Care: New/Established - ?Level 2 ?Electronic Signature(s) ?Signed: 04/18/2022 4:20:03 PM By: ?Entered By: on 04/18/2022 15:44:14 ?Stcharles, Mariateresa [name]. ( ) ?-------------------------------------------------------------------------------- ?Encounter Discharge Information Details ?Patient Name: Sara [name], Sara [name]. ?Date of Service: 04/18/2022 3:00 PM ?Medical Record Number: ?Patient Account Number: ?Date of Birth/Sex: 08-Oct-1966 (56 y.o. F) ?Treating RN: ?Primary Care Kendrick Remigio: Other Clinician: ?Referring Kimerly Rowand: ?Treating Suhayb Anzalone/Extender:  ?Weeks in Treatment: 1 ?Encounter Discharge Information Items ?Discharge Condition: Stable ?Ambulatory Status: Wheelchair ?Discharge Destination: Home ?Transportation: Private Auto ?Accompanied By: sister ?Schedule Follow-up Appointment: Yes ?Clinical Summary of Care: Patient Declined ?Electronic Signature(s) ?Signed: 04/18/2022 4:20:03 PM By: 04/20/2022 ?Entered By: Angelina Pih on 04/18/2022 15:43:28 ?Gossard, Temeca [name]. (04/20/2022) ?-------------------------------------------------------------------------------- ?Wound Assessment Details ?Patient Name: Sterbenz, Ajee [name]. ?Date of Service: 04/18/2022 3:00 PM ?Medical Record Number: 04/20/2022 ?Patient Account Number: [id number] ?Date of Birth/Sex: 1966-05-27 (56 y.o. F) ?Treating RN: 57 ?Primary Care Tarisha Fader: Angelina Pih Other Clinician: ?Referring Kyl Givler: Meryl Crutch ?Treating Waleed Dettman/Extender: Meryl Crutch ?Weeks in Treatment: 1 ?Wound Status ?Wound Number: 1 ?Primary Etiology: Open Surgical Wound ?Wound Location: Abdomen - midline ?Wound Status: Open ?Wounding Event: Surgical Injury ?Comorbid History: Asthma, Arrhythmia ?Date Acquired: 03/25/2022 ?Weeks Of Treatment: 1 ?Clustered Wound: No ?Wound Measurements ?Length: (cm) 10.3 ?Width: (cm) 13 ?Depth: (cm) 1.4 ?Area: (cm?) 105.165 ?Volume: (cm?) 147.231 ?% Reduction in Area: -2% ?% Reduction in Volume: -2% ?Wound Description ?Classification: Full Thickness With Exposed Support Structures ?Exudate Amount: Large ?Exudate Type: Serosanguineous ?Exudate Color: red, brown ?Foul Odor After Cleansing: No ?Slough/Fibrino Yes ?Wound Bed ?Granulation Amount: Large (67-100%) Exposed Structure ?Granulation Quality: Red ?Fascia Exposed: No ?Necrotic Amount: Small (1-33%) ?Fat Layer (Subcutaneous Tissue) Exposed: Yes ?Necrotic Quality: Adherent Slough ?Tendon Exposed: No ?Muscle Exposed: Yes ?Necrosis of Muscle: No ?Joint Exposed: No ?Bone Exposed: No ?Treatment Notes ?Wound #1 (Abdomen -  midline) ?Cleanser ?Peri-Wound Care ?Topical ?Primary Dressing ?Secondary Dressing ?Secured With ?Compression Wrap ?Compression Stockings ?Add-Ons ?Electronic Signature(s) ?Signed: 04/18/2022 4:20:03 PM By: Gord

## 2022-04-19 NOTE — Progress Notes (Signed)
Whittaker, Lesslie A. ([id number]) ?Visit Report for 04/18/2022 ?Physician Orders Details ?Patient Name: Sara [name], [name] ?Date of Service: 04/18/2022 3:00 PM ?Medical Record Number: [id number] ?Patient Account Number: [id number] ?Date of Birth/Sex: 11-07-1966 (56 y.o. F) ?Treating RN: Angelina Pih ?Primary Care Provider: Meryl Crutch Other Clinician: ?Referring Provider: Meryl Crutch ?Treating Provider/Extender: Allen Derry ?Weeks in Treatment: 1 ?Verbal / Phone Orders: No ?Diagnosis Coding ?Follow-up Appointments ?o Return Appointment in 1 week. - Tues-Provider ?o Return Appointment in: - Friday wound vac maintenance ?Bathing/ Shower/ Hygiene ?o May shower with wound dressing protected with water repellent cover or cast protector. ?o No tub bath. ?Additional Orders / Instructions ?o Follow Nutritious Diet and Increase Protein Intake ?o Other: - Continue to wear recommended abdominal binder ?non-adherent pads covered with tegaderm ?Negative Pressure Wound Therapy ?o Wound VAC settings at continuous pressure. Use foam to wound cavity. Please order WHITE foam to fill any ?tunnel/s and/or undermining when necessary. Change VAC dressing 2 X WEEK. Change canister as indicated when full. ?o Apply contact layer over base of wound. - mepitel over sutures/mesh in wound ?o Other: - If wound vac not functioning over two hours-remove and apply wet to dry saline dressing daily until wound vac replaced; call for ?nurse visit ?Wound Treatment ?Electronic Signature(s) ?Signed: 04/18/2022 4:20:03 PM By: Angelina Pih ?Signed: 04/19/2022 5:32:35 PM By: Lenda Kelp PA-C ?Entered By: Angelina Pih on 04/18/2022 15:42:58 ?Sara [name], [name] ([id number]) ?-------------------------------------------------------------------------------- ?SuperBill Details ?Patient Name: Sara [name], [name] ?Date of Service: 04/18/2022 ?Medical Record Number: [id number] ?Patient Account Number: [id number] ?Date of Birth/Sex:  Sep 11, 1966 (56 y.o. 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

## 2022-04-22 ENCOUNTER — Ambulatory Visit
Admission: RE | Admit: 2022-04-22 | Discharge: 2022-04-22 | Disposition: A | Discharge status: Home / Self Care | Payer: Medicaid Other | Source: Ambulatory Visit | Attending: Nurse Practitioner | Admitting: Nurse Practitioner

## 2022-04-22 DIAGNOSIS — N6001 Solitary cyst of right breast: Secondary | ICD-10-CM | POA: Insufficient documentation

## 2022-04-22 DIAGNOSIS — N63 Unspecified lump in unspecified breast: Secondary | ICD-10-CM | POA: Insufficient documentation

## 2022-04-22 DIAGNOSIS — N644 Mastodynia: Secondary | ICD-10-CM | POA: Diagnosis not present

## 2022-04-23 ENCOUNTER — Encounter: Payer: Medicaid Other | Admitting: Physician Assistant

## 2022-04-23 DIAGNOSIS — L98495 Non-pressure chronic ulcer of skin of other sites with muscle involvement without evidence of necrosis: Secondary | ICD-10-CM | POA: Diagnosis not present

## 2022-04-23 DIAGNOSIS — S31103A Unspecified open wound of abdominal wall, right lower quadrant without penetration into peritoneal cavity, initial encounter: Secondary | ICD-10-CM | POA: Diagnosis not present

## 2022-04-23 DIAGNOSIS — T8131XA Disruption of external operation (surgical) wound, not elsewhere classified, initial encounter: Secondary | ICD-10-CM | POA: Diagnosis not present

## 2022-04-24 ENCOUNTER — Ambulatory Visit: Payer: Medicaid Other
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
## 2022-04-24 NOTE — Progress Notes (Signed)
Palos, Fatimah A. ([id number]) ?Visit Report for 04/23/2022 ?Arrival Information Details ?Patient Name: Sara [name], [name] ?Date of Service: 04/23/2022 3:30 PM ?Medical Record Number: [id number] ?Patient Account Number: [id number] ?Date of Birth/Sex: October 18, 1966 (56 y.o. F) ?Treating RN: Yevonne Pax ?Primary Care Heiress Williamson: Meryl Crutch Other Clinician: ?Referring Loveta Dellis: Meryl Crutch ?Treating Rockey Guarino/Extender: Allen Derry ?Weeks in Treatment: 1 ?Visit Information History Since Last Visit ?All ordered tests and consults were completed: No ?Patient Arrived: Wheel Chair ?Added or deleted any medications: No ?Arrival Time: 15:47 ?Any new allergies or adverse reactions: No ?Accompanied By: Coster ?Had a fall or experienced change in No ?Transfer Assistance: None ?activities of daily living that may affect ?Patient Identification Verified: Yes ?risk of falls: ?Secondary Verification Process Completed: Yes ?Signs or symptoms of abuse/neglect since last visito No ?Patient Requires Transmission-Based Precautions: No ?Hospitalized since last visit: No ?Patient Has Alerts: Yes ?Implantable device outside of the clinic excluding No ?Patient Alerts: NOT diabetic ?cellular tissue based products placed in the center ?since last visit: ?Has Dressing in Place as Prescribed: Yes ?Pain Present Now: Yes ?Electronic Signature(s) ?Signed: 04/24/2022 8:47:39 AM By: Yevonne Pax RN ?Entered By: Yevonne Pax on 04/23/2022 15:53:28 ?Loera, Gillian A. ([id number]) ?-------------------------------------------------------------------------------- ?Clinic Level of Care Assessment Details ?Patient Name: [name], [name] ?Date of Service: 04/23/2022 3:30 PM ?Medical Record Number: [id number] ?Patient Account Number: [id number] ?Date of Birth/Sex: [birth year] (56 y.o. F) ?Treating RN: Yevonne Pax ?Primary Care Kameryn Tisdel: Meryl Crutch Other Clinician: ?Referring Karryn Kosinski: Meryl Crutch ?Treating Blinda Turek/Extender: Allen Derry ?Weeks in  Treatment: 1 ?Clinic Level of Care Assessment Items ?TOOL 1 Quantity Score ?[]  - Use when EandM and Procedure is performed on INITIAL visit 0 ?ASSESSMENTS - Nursing Assessment / Reassessment ?[]  - General Physical Exam (combine w/ comprehensive assessment (listed just below) when performed on new ?0 ?pt. evals) ?[]  - 0 ?Comprehensive Assessment (HX, ROS, Risk Assessments, Wounds Hx, etc.) ?ASSESSMENTS - Wound and Skin Assessment / Reassessment ?[]  - Dermatologic / Skin Assessment (not related to wound area) 0 ?ASSESSMENTS - Ostomy and/or Continence Assessment and Care ?[]  - Incontinence Assessment and Management 0 ?[]  - 0 ?Ostomy Care Assessment and Management (repouching, etc.) ?PROCESS - Coordination of Care ?[]  - Simple Patient / Family Education for ongoing care 0 ?[]  - 0 ?Complex (extensive) Patient / Family Education for ongoing care ?[]  - 0 ?Staff obtains Consents, Records, Test Results / Process Orders ?[]  - 0 ?Staff telephones HHA, Nursing Homes / Clarify orders / etc ?[]  - 0 ?Routine Transfer to another Facility (non-emergent condition) ?[]  - 0 ?Routine Hospital Admission (non-emergent condition) ?[]  - 0 ?New Admissions / / Ordering NPWT, Apligraf, etc. ?[]  - 0 ?Emergency Hospital Admission (emergent condition) ?PROCESS - Special Needs ?[]  - Pediatric / Minor Patient Management 0 ?[]  - 0 ?Isolation Patient Management ?[]  - 0 ?Hearing / Language / Visual special needs ?[]  - 0 ?Assessment of Community assistance (transportation, D/C planning, etc.) ?[]  - 0 ?Additional assistance / Altered mentation ?[]  - 0 ?Support Surface(s) Assessment (bed, cushion, seat, etc.) ?INTERVENTIONS - Miscellaneous ?[]  - External ear exam 0 ?[]  - 0 ?Patient Transfer (multiple staff / / Similar devices) ?[]  - 0 ?Simple Staple / Suture removal (25 or less) ?[]  - 0 ?Complex Staple / Suture removal (26 or more) ?[]  - 0 ?Hypo/Hyperglycemic Management (do not check if billed separately) ?[]  -  0 ?Ankle / Brachial Index (ABI) - do not check if billed separately ?Has the patient been seen at the hospital within the last three years:  Yes ?Total Score: 0 ?Level Of Care: ____ ?Caylor, Lataja A. ([id number]) ?Electronic Signature(s) ?Signed: 04/24/2022 8:47:39 AM By: Yevonne Pax RN ?Entered By: Yevonne Pax on 04/23/2022 16:08:46 ?Blincoe, Mikeria A. ([id number]) ?-------------------------------------------------------------------------------- ?Encounter Discharge Information Details ?Patient Name: [name], [name] ?Date of Service: 04/23/2022 3:30 PM ?Medical Record Number: [id number] ?Patient Account Number: [id number] ?Date of Birth/Sex: [birth year] (56 y.o. F) ?Treating RN: Yevonne Pax ?Primary Care Vyom Brass: Meryl Crutch Other Clinician: ?Referring Raiya Stainback: Meryl Crutch ?Treating Felecity Lemaster/Extender: Allen Derry ?Weeks in Treatment: 1 ?Encounter Discharge Information Items ?Discharge Condition: Stable ?Ambulatory Status: Ambulatory ?Discharge Destination: Home ?Transportation: Private Auto ?Accompanied By: Mousseau ?Schedule Follow-up Appointment: Yes ?Clinical Summary of Care: Patient Declined ?Electronic Signature(s) ?Signed: 04/24/2022 8:47:39 AM By: Yevonne Pax RN ?Entered By: Yevonne Pax on 04/23/2022 16:10:58 ?Drewry, Rebacca A. ([id number]) ?-------------------------------------------------------------------------------- ?Lower Extremity Assessment Details ?Patient Name: [name], [name] ?Date of Service: 04/23/2022 3:30 PM ?Medical Record Number: [id number] ?Patient Account Number: [id number] ?Date of Birth/Sex: Jan 30, 1966 (56 y.o. F) ?Treating RN: Yevonne Pax ?Primary Care Brit Carbonell: Meryl Crutch Other Clinician: ?Referring Jerren Flinchbaugh: Meryl Crutch ?Treating Launi Asencio/Extender: Allen Derry ?Weeks in Treatment: 1 ?Electronic Signature(s) ?Signed: 04/24/2022 8:47:39 AM By: Yevonne Pax RN ?Entered By: Yevonne Pax on 04/23/2022 16:03:55 ?Bradshaw, Addalee A.  ([id number]) ?-------------------------------------------------------------------------------- ?Multi Wound Chart Details ?Patient Name: [name], [name] ?Date of Service: 04/23/2022 3:30 PM ?Medical Record Number: [id number] ?Patient Account Number: [id number] ?Date of Birth/Sex: 1966-04-01 (56 y.o. F) ?Treating RN: Yevonne Pax ?Primary Care Olanda Downie: Meryl Crutch Other Clinician: ?Referring Claramae Rigdon: Meryl Crutch ?Treating Maceo Hernan/Extender: Allen Derry ?Weeks in Treatment: 1 ?Vital Signs ?Height(in): 65 ?Pulse(bpm): 68 ?Weight(lbs): 220 ?Blood Pressure(mmHg): 111/67 ?Body Mass Index(BMI): 36.6 ?Temperature(??F): 98.5 ?Respiratory Rate(breaths/min): 18 ?Photos: [N/A:N/A] ?Wound Location: Abdomen - midline N/A N/A ?Wounding Event: Surgical Injury N/A N/A ?Primary Etiology: Open Surgical Wound N/A N/A ?Comorbid History: Asthma, Arrhythmia N/A N/A ?Date Acquired: 03/25/2022 N/A N/A ?Weeks of Treatment: 1 N/A N/A ?Wound Status: Open N/A N/A ?Wound Recurrence: No N/A N/A ?Measurements L x W x D (cm) 9.2x13x0.4 N/A N/A ?Area (cm?) : 93.934 N/A N/A ?Volume (cm?) : 37.573 N/A N/A ?% Reduction in Area: 8.90% N/A N/A ?% Reduction in Volume: 74.00% N/A N/A ?Classification: Full Thickness With Exposed N/A N/A ?Support Structures ?Exudate Amount: Large N/A N/A ?Exudate Type: Serosanguineous N/A N/A ?Exudate Color: red, brown N/A N/A ?Granulation Amount: Large (67-100%) N/A N/A ?Granulation Quality: Red N/A N/A ?Necrotic Amount: Small (1-33%) N/A N/A ?Exposed Structures: ?Fat Layer (Subcutaneous Tissue): N/A N/A ?Yes ?Muscle: Yes ?Fascia: No ?Tendon: No ?Joint: No ?Bone: No ?Epithelialization: None N/A N/A ?Treatment Notes ?Electronic Signature(s) ?Signed: 04/24/2022 8:47:39 AM By: Yevonne Pax RN ?Entered By: Yevonne Pax on 04/23/2022 16:04:10 ?Mulka, Brantley A. ([id number]) ?-------------------------------------------------------------------------------- ?Multi-Disciplinary Care Plan Details ?Patient Name: [name], Sara  A. ?Date of Service: 04/23/2022 3:30 PM ?Medical Record Number: [id number] ?Patient Account Number: [id number] ?Date of Birth/Sex: [birth year] (56 y.o. F) ?Treating RN: Yevonne Pax ?Primary Care Trea Carnegie: Meryl Crutch Other Clinician: ?Referring Andrae Claunch: Karma Greaser,

## 2022-04-26 DIAGNOSIS — S31103A Unspecified open wound of abdominal wall, right lower quadrant without penetration into peritoneal cavity, initial encounter: Secondary | ICD-10-CM | POA: Diagnosis not present

## 2022-04-26 DIAGNOSIS — L98495 Non-pressure chronic ulcer of skin of other sites with muscle involvement without evidence of necrosis: Secondary | ICD-10-CM | POA: Diagnosis not present

## 2022-04-26 DIAGNOSIS — T8131XA Disruption of external operation (surgical) wound, not elsewhere classified, initial encounter: Secondary | ICD-10-CM | POA: Diagnosis not present

## 2022-04-26 NOTE — Progress Notes (Signed)
Romano, Kalle A. ([id number]) ?Visit Report for 04/26/2022 ?Physician Orders Details ?Patient Name: Sara [name], [name] ?Date of Service: 04/26/2022 1:45 PM ?Medical Record Number: [id number] ?Patient Account Number: [id number] ?Date of Birth/Sex: 28-Mar-1966 (56 y.o. F) ?Treating RN: Hansel Feinstein ?Primary Care Provider: Meryl Crutch Other Clinician: ?Referring Provider: Meryl Crutch ?Treating Provider/Extender: Allen Derry ?Weeks in Treatment: 2 ?Verbal / Phone Orders: No ?Diagnosis Coding ?Follow-up Appointments ?o Return Appointment in 1 week. - Tues and Fridays ?o Return Appointment in: - Tuesday and Friday wound vac maintenance ?o Nurse Visit as needed ?Bathing/ Shower/ Hygiene ?o May shower with wound dressing protected with water repellent cover or cast protector. ?o No tub bath. ?Additional Orders / Instructions ?o Follow Nutritious Diet and Increase Protein Intake ?o Other: - Continue to wear recommended abdominal binder ?non-adherent pads covered with tegaderm ?Negative Pressure Wound Therapy ?o Wound VAC settings at continuous pressure. Use foam to wound cavity. Please order WHITE foam to fill any ?tunnel/s and/or undermining when necessary. Change VAC dressing 2 X WEEK. Change canister as indicated when full. ?o Apply contact layer over base of wound. - mepitel over sutures/mesh in wound ?o Other: - If wound vac not functioning over two hours-remove and apply wet to dry saline dressing daily until wound vac replaced; call for ?nurse visit ?Wound Treatment ?Wound #1 - Abdomen - midline ?Cleanser: Normal Saline 2 x Per Week/30 Days ?Discharge Instructions: Wash your hands with soap and water. Remove old dressing, discard into plastic bag and place into trash. ?Cleanse the wound with Normal Saline prior to applying a clean dressing using gauze sponges, not tissues or cotton balls. Do not scrub ?or use excessive force. Pat dry using gauze sponges, not tissue or cotton  balls. ?Cleanser: Wound Cleanser 2 x Per Week/30 Days ?Discharge Instructions: Wash your hands with soap and water. Remove old dressing, discard into plastic bag and place into trash. ?Cleanse the wound with Wound Cleanser prior to applying a clean dressing using gauze sponges, not tissues or cotton balls. Do not ?scrub or use excessive force. Pat dry using gauze sponges, not tissue or cotton balls. ?Electronic Signature(s) ?Signed: 04/26/2022 4:06:41 PM By: Lenda Kelp PA-C ?Signed: 04/26/2022 4:09:30 PM By: Hansel Feinstein ?Entered ByHansel Feinstein on 04/26/2022 14:25:46 ?Sara [name], [name] ([id number]) ?-------------------------------------------------------------------------------- ?SuperBill Details ?Patient Name: Sara [name], [name] ?Date of Service: 04/26/2022 ?Medical Record Number: [id number] ?Patient Account Number: [id number] ?Date of Birth/Sex: Oct 28, 1966 (56 y.o. 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 ?Facility Procedures ?CPT4 Code Description: 77939688 97606 - WOUND VAC-GREATER TH 50 SQ CM ?Modifier: ?Quantity: 1 ?CPT4 Code Description: ICD-10 Diagnosis Description 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 w T81.31XD Disruption of external operation (surgical) wound, not elsewhere classified ?Modifier: ithout evidence of , subsequent encou ?Quantity: necrosis nter ?Physician Procedures ?CPT4 Code Description: 6484720 97606 - WC PHYS TX WOUND VAC>50 SQ CM ?Modifier: ?Quantity: 1 ?CPT4 Code Description: ICD-10 Diagnosis Description L98.495 Non-pressure chronic  ulcer of skin of other sites with muscle involvement w T81.31XD Disruption of external operation (surgical) wound, not elsewhere classified ?Modifier: ithout evidence of , subsequent encou ?Quantity: necrosis nter ?Electronic Signature(s) ?Signed: 04/26/2022 4:06:41 PM By: Lenda Kelp  PA-C ?Signed: 04/26/2022 4:09:30 PM By: Hansel Feinstein ?Entered ByHansel Feinstein on 04/26/2022 14:26:18 ?

## 2022-04-26 NOTE — Progress Notes (Signed)
Clopper, Hartley A. ([id number]) ?Visit Report for 04/26/2022 ?Arrival Information Details ?Patient Name: [name], [name] ?Date of Service: 04/26/2022 1:45 PM ?Medical Record Number: [id number] ?Patient Account Number: [id number] ?Date of Birth/Sex: Apr 05, 1966 (56 y.o. F) ?Treating RN: Hansel Feinstein ?Primary Care Gratia Disla: Meryl Crutch Other Clinician: ?Referring Akai Dollard: Meryl Crutch ?Treating Ayonna Speranza/Extender: Allen Derry ?Weeks in Treatment: 2 ?Visit Information History Since Last Visit ?Added or deleted any medications: No ?Patient Arrived: Ambulatory ?Had a fall or experienced change in No ?Arrival Time: 13:51 ?activities of daily living that may affect ?Accompanied By: Polcyn ?risk of falls: ?Transfer Assistance: None ?Hospitalized since last visit: No ?Patient Identification Verified: Yes ?Has Dressing in Place as Prescribed: Yes ?Secondary Verification Process Completed: Yes ?Pain Present Now: Yes ?Patient Requires Transmission-Based Precautions: No ?Patient Has Alerts: Yes ?Patient Alerts: NOT diabetic ?Electronic Signature(s) ?Signed: 04/26/2022 4:09:30 PM By: Hansel Feinstein ?Entered ByHansel Feinstein on 04/26/2022 13:52:01 ?[name], [name] ([id number]) ?-------------------------------------------------------------------------------- ?Encounter Discharge Information Details ?Patient Name: [name], [name] ?Date of Service: 04/26/2022 1:45 PM ?Medical Record Number: [id number] ?Patient Account Number: [id number] ?Date of Birth/Sex: 04-10-1966 (56 y.o. F) ?Treating RN: Hansel Feinstein ?Primary Care Brandilyn Nanninga: Meryl Crutch Other Clinician: ?Referring Chritopher Coster: Meryl Crutch ?Treating Carolann Brazell/Extender: Allen Derry ?Weeks in Treatment: 2 ?Encounter Discharge Information Items ?Discharge Condition: Stable ?Ambulatory Status: Wheelchair ?Discharge Destination: Home ?Transportation: Private Auto ?Accompanied By: Penado ?Schedule Follow-up Appointment: Yes ?Clinical Summary of Care: ?Electronic Signature(s) ?Signed:  04/26/2022 4:09:30 PM By: Hansel Feinstein ?Entered ByHansel Feinstein on 04/26/2022 14:26:05 ?[name], [name] ([id number]) ?-------------------------------------------------------------------------------- ?Negative Pressure Wound Therapy Maintenance (NPWT) Details ?Patient Name: [name], [name] ?Date of Service: 04/26/2022 1:45 PM ?Medical Record Number: [id number] ?Patient Account Number: [id number] ?Date of Birth/Sex: [birth year] (56 y.o. F) ?Treating RN: Hansel Feinstein ?Primary Care Jacobie Stamey: Meryl Crutch Other Clinician: ?Referring Madelyne Millikan: Meryl Crutch ?Treating Kaylanie Capili/Extender: Allen Derry ?Weeks in Treatment: 2 ?NPWT Maintenance Performed for: Wound #1 Abdomen - midline ?Performed By: Hansel Feinstein, RN ?Type: Other ?Coverage Size (sq cm): 101.4 ?Pressure Type: Constant ?Pressure Setting: 125 mmHG ?Drain Type: None ?Primary Contact: ?Other : mepitel ?Sponge/Dressing Type: Foam, Black ?Date Initiated: 04/10/2022 ?Dressing Removed: Yes ?Quantity of Sponges/Gauze Removed: 1 ?Canister Changed: Yes ?Canister Exudate Volume: 50 ?Dressing Reapplied: Yes ?Quantity of Sponges/Gauze Inserted: 1 ?Respones To Treatment: tolerated well ?Days On NPWT: 17 ?Electronic Signature(s) ?Signed: 04/26/2022 4:09:30 PM By: Hansel Feinstein ?Entered ByHansel Feinstein on 04/26/2022 14:24:25 ?[name], [name] ([id number]) ?-------------------------------------------------------------------------------- ?Wound Assessment Details ?Patient Name: [name], [name] ?Date of Service: 04/26/2022 1:45 PM ?Medical Record Number: [id number] ?Patient Account Number: [id number] ?Date of Birth/Sex: 1966-06-21 (56 y.o. F) ?Treating RN: Hansel Feinstein ?Primary Care Shawnika Pepin: Meryl Crutch Other Clinician: ?Referring Aury Scollard: Meryl Crutch ?Treating Trace Wirick/Extender: Allen Derry ?Weeks in Treatment: 2 ?Wound Status ?Wound Number: 1 ?Primary Etiology: Open Surgical Wound ?Wound Location: Abdomen - midline ?Wound Status: Open ?Wounding Event: Surgical  Injury ?Comorbid History: Asthma, Arrhythmia ?Date Acquired: 03/25/2022 ?Weeks Of Treatment: 2 ?Clustered Wound: No ?Photos ?Wound Measurements ?Length: (cm) 7.8 ?Width: (cm) 13 ?Depth: (cm) 0.4 ?Area: (cm?) 79.639 ?Volume: (cm?) 31.856 ?% Reduction in Area: 22.7% ?% Reduction in Volume: 77.9% ?Epithelialization: None ?Tunneling: No ?Undermining: No ?Wound Description ?Classification: Full Thickness With Exposed Support Structures ?Exudate Amount: Large ?Exudate Type: Serosanguineous ?Exudate Color: red, brown ?Foul Odor After Cleansing: No ?Slough/Fibrino Yes ?Wound Bed ?Granulation Amount: Large (67-100%) Exposed Structure ?Granulation Quality: Red ?Fascia Exposed: No ?Necrotic Amount: Small (1-33%) ?Fat Layer (Subcutaneous Tissue) Exposed: Yes ?Necrotic Quality: Adherent Slough ?Tendon Exposed: No ?Muscle Exposed: Yes ?Necrosis of  Muscle: No ?Joint Exposed: No ?Bone Exposed: No ?Treatment Notes ?Wound #1 (Abdomen - midline) ?Cleanser ?Normal Saline ?Discharge Instruction: Wash your hands with soap and water. Remove old dressing, discard into plastic bag and place into trash. ?Cleanse the wound with Normal Saline prior to applying a clean dressing using gauze sponges, not tissues or cotton balls. Do not ?scrub or use excessive force. Pat dry using gauze sponges, not tissue or cotton balls. ?[name], [name] ([id number]) ?Wound Cleanser ?Discharge Instruction: Wash your hands with soap and water. Remove old dressing, discard into plastic bag and place into trash. ?Cleanse the wound with Wound Cleanser prior to applying a clean dressing using gauze sponges, not tissues or cotton balls. Do ?not scrub or use excessive force. Pat dry using gauze sponges, not tissue or cotton balls. ?Peri-Wound Care ?Topical ?Primary Dressing ?Secondary Dressing ?Secured With ?Compression Wrap ?Compression Stockings ?Add-Ons ?Electronic Signature(s) ?Signed: 04/26/2022 4:09:30 PM By: Hansel Feinstein ?Entered ByHansel Feinstein on 04/26/2022  14:19:15 ?

## 2022-04-29 ENCOUNTER — Ambulatory Visit: Payer: Medicaid Other

## 2022-04-30 ENCOUNTER — Encounter: Payer: Medicaid Other | Attending: Nurse Practitioner

## 2022-04-30 DIAGNOSIS — T8131XA Disruption of external operation (surgical) wound, not elsewhere classified, initial encounter: Secondary | ICD-10-CM | POA: Diagnosis not present

## 2022-04-30 DIAGNOSIS — L98495 Non-pressure chronic ulcer of skin of other sites with muscle involvement without evidence of necrosis: Secondary | ICD-10-CM | POA: Insufficient documentation

## 2022-04-30 DIAGNOSIS — X58XXXA Exposure to other specified factors, initial encounter: Secondary | ICD-10-CM | POA: Insufficient documentation

## 2022-04-30 NOTE — Progress Notes (Signed)
Nicolaisen, Kelley A. ([id number]) ?Visit Report for 04/30/2022 ?Arrival Information Details ?Patient Name: Sara [name], [name] ?Date of Service: 04/30/2022 2:30 PM ?Medical Record Number: [id number] ?Patient Account Number: [id number] ?Date of Birth/Sex: 1966-06-25 (56 y.o. F) ?Treating RN: Donnamarie Poag ?Primary Care Rosalea Withrow: Margurite Auerbach Other Clinician: ?Referring Bekki Tavenner: Margurite Auerbach ?Treating Nashaly Dorantes/Extender: Jeri Cos ?Weeks in Treatment: 2 ?Visit Information History Since Last Visit ?Added or deleted any medications: No ?Patient Arrived: Wheel Chair ?Had a fall or experienced change in No ?Arrival Time: 15:03 ?activities of daily living that may affect ?Accompanied By: Chancy ?risk of falls: ?Transfer Assistance: None ?Hospitalized since last visit: No ?Patient Identification Verified: Yes ?Has Dressing in Place as Prescribed: Yes ?Secondary Verification Process Completed: Yes ?Pain Present Now: No ?Patient Requires Transmission-Based Precautions: No ?Patient Has Alerts: Yes ?Patient Alerts: NOT diabetic ?Electronic Signature(s) ?Signed: 04/30/2022 4:17:52 PM By: Donnamarie Poag ?Entered ByDonnamarie Poag on 04/30/2022 15:09:28 ?Tosi, Nealie A. ([id number]) ?-------------------------------------------------------------------------------- ?Encounter Discharge Information Details ?Patient Name: [name], [name] ?Date of Service: 04/30/2022 2:30 PM ?Medical Record Number: [id number] ?Patient Account Number: [id number] ?Date of Birth/Sex: [birth year] (56 y.o. F) ?Treating RN: Donnamarie Poag ?Primary Care Ople Girgis: Margurite Auerbach Other Clinician: ?Referring Zaelyn Barbary: Margurite Auerbach ?Treating Humbert Morozov/Extender: Jeri Cos ?Weeks in Treatment: 2 ?Encounter Discharge Information Items ?Discharge Condition: Stable ?Ambulatory Status: Wheelchair ?Discharge Destination: Home ?Transportation: Private Auto ?Accompanied By: Lichty ?Schedule Follow-up Appointment: Yes ?Clinical Summary of Care: ?Electronic Signature(s) ?Signed: 04/30/2022  4:17:52 PM By: Donnamarie Poag ?Entered ByDonnamarie Poag on 04/30/2022 15:36:33 ?Carreon, Shakoya A. ([id number]) ?-------------------------------------------------------------------------------- ?Negative Pressure Wound Therapy Maintenance (NPWT) Details ?Patient Name: [name], [name] ?Date of Service: 04/30/2022 2:30 PM ?Medical Record Number: [id number] ?Patient Account Number: [id number] ?Date of Birth/Sex: Oct 21, 1966 (57 y.o. F) ?Treating RN: Donnamarie Poag ?Primary Care Allysia Ingles: Margurite Auerbach Other Clinician: ?Referring Beryl Hornberger: Margurite Auerbach ?Treating Rubens Cranston/Extender: Jeri Cos ?Weeks in Treatment: 2 ?NPWT Maintenance Performed for: Wound #1 Abdomen - midline ?Performed By: Donnamarie Poag, RN ?Type: Other ?Coverage Size (sq cm): 91.25 ?Pressure Type: Constant ?Pressure Setting: 125 mmHG ?Drain Type: None ?Primary Contact: ?Other : ENDOFORM AND mepitel ?Sponge/Dressing Type: Foam, Black ?Date Initiated: 04/10/2022 ?Dressing Removed: No ?Quantity of Sponges/Gauze Removed: 1 ?Canister Changed: No ?Dressing Reapplied: Yes ?Quantity of Sponges/Gauze Inserted: 1 ?Respones To Treatment: tolerated well ?Days On NPWT: 21 ?Notes ?stated she changed the full canister 04/29/22 at [facility] ?Electronic Signature(s) ?Signed: 04/30/2022 4:17:52 PM By: Donnamarie Poag ?Entered ByDonnamarie Poag on 04/30/2022 15:37:07 ?Rhoda, Cresencia A. ([id number]) ?-------------------------------------------------------------------------------- ?Wound Assessment Details ?Patient Name: [name], [name] ?Date of Service: 04/30/2022 2:30 PM ?Medical Record Number: [id number] ?Patient Account Number: [id number] ?Date of Birth/Sex: January 05, 1966 (56 y.o. F) ?Treating RN: Donnamarie Poag ?Primary Care Vernell Back: Margurite Auerbach Other Clinician: ?Referring Giabella Duhart: Margurite Auerbach ?Treating Aland Chestnutt/Extender: Jeri Cos ?Weeks in Treatment: 2 ?Wound Status ?Wound Number: 1 ?Primary Etiology: Open Surgical Wound ?Wound Location: Abdomen - midline ?Wound Status: Open ?Wounding  Event: Surgical Injury ?Comorbid History: Asthma, Arrhythmia ?Date Acquired: 03/25/2022 ?Weeks Of Treatment: 2 ?Clustered Wound: No ?Photos ?Wound Measurements ?Length: (cm) 7.3 ?Width: (cm) 12.5 ?Depth: (cm) 0.3 ?Area: (cm?) 71.668 ?Volume: (cm?) 21.5 ?% Reduction in Area: 30.5% ?% Reduction in Volume: 85.1% ?Epithelialization: None ?Tunneling: No ?Undermining: No ?Wound Description ?Classification: Full Thickness With Exposed Support Structures ?Exudate Amount: Large ?Exudate Type: Serosanguineous ?Exudate Color: red, brown ?Foul Odor After Cleansing: No ?Slough/Fibrino Yes ?Wound Bed ?Granulation Amount: Large (67-100%) Exposed Structure ?Granulation Quality: Red, Hyper-granulation ?Fascia Exposed: No ?Necrotic Amount: None Present (0%) ?Fat Layer (Subcutaneous Tissue) Exposed: Yes ?Tendon  Exposed: No ?Muscle Exposed: Yes ?Necrosis of Muscle: No ?Joint Exposed: No ?Bone Exposed: No ?Treatment Notes ?Wound #1 (Abdomen - midline) ?Cleanser ?Normal Saline ?Discharge Instruction: Wash your hands with soap and water. Remove old dressing, discard into plastic bag and place into trash. ?Cleanse the wound with Normal Saline prior to applying a clean dressing using gauze sponges, not tissues or cotton balls. Do not ?scrub or use excessive force. Pat dry using gauze sponges, not tissue or cotton balls. ?Mousel, Yiselle A. ([id number]) ?Wound Cleanser ?Discharge Instruction: Wash your hands with soap and water. Remove old dressing, discard into plastic bag and place into trash. ?Cleanse the wound with Wound Cleanser prior to applying a clean dressing using gauze sponges, not tissues or cotton balls. Do ?not scrub or use excessive force. Pat dry using gauze sponges, not tissue or cotton balls. ?Peri-Wound Care ?Topical ?Primary Dressing ?Secondary Dressing ?Secured With ?Compression Wrap ?Compression Stockings ?Add-Ons ?Electronic Signature(s) ?Signed: 04/30/2022 4:17:52 PM By: Donnamarie Poag ?Entered ByDonnamarie Poag on 04/30/2022  15:18:17 ?

## 2022-04-30 NOTE — Progress Notes (Signed)
Colavito, Finnlee A. ([id number]) ?Visit Report for 04/30/2022 ?Physician Orders Details ?Patient Name: Sara [name], [name] ?Date of Service: 04/30/2022 2:30 PM ?Medical Record Number: [id number] ?Patient Account Number: [id number] ?Date of Birth/Sex: Jun 15, 1966 (56 y.o. F) ?Treating RN: Donnamarie Poag ?Primary Care Provider: Margurite Auerbach Other Clinician: ?Referring Provider: Margurite Auerbach ?Treating Provider/Extender: Jeri Cos ?Weeks in Treatment: 2 ?Verbal / Phone Orders: No ?Diagnosis Coding ?Follow-up Appointments ?o Return Appointment in 1 week. - Tues and Fridays ?o Return Appointment in: - Tuesday and Friday wound vac maintenance ?o Nurse Visit as needed ?Bathing/ Shower/ Hygiene ?o May shower with wound dressing protected with water repellent cover or cast protector. ?o No tub bath. ?Additional Orders / Instructions ?o Follow Nutritious Diet and Increase Protein Intake ?o Other: - Continue to wear recommended abdominal binder ?silver cell to open suture below wound ?Negative Pressure Wound Therapy ?o Wound VAC settings at 136mmHg continuous pressure. Use foam to wound cavity. Please order WHITE foam to fill any ?tunnel/s and/or undermining when necessary. Change VAC dressing 2 X WEEK. Change canister as indicated when full. - ?WINDOW PANE AROUND WOUND FOR EITHER DRAPE USED ?o Apply contact layer over base of wound. - Endoform over sutures then mepitel over sutures/mesh in wound ?o Other: - If wound vac not functioning over two hours-remove and apply wet to dry saline dressing daily until wound vac replaced; call for ?nurse visit ?Wound Treatment ?Wound #1 - Abdomen - midline ?Cleanser: Normal Saline 2 x Per Week/30 Days ?Discharge Instructions: Wash your hands with soap and water. Remove old dressing, discard into plastic bag and place into trash. ?Cleanse the wound with Normal Saline prior to applying a clean dressing using gauze sponges, not tissues or cotton balls. Do not scrub ?or use  excessive force. Pat dry using gauze sponges, not tissue or cotton balls. ?Cleanser: Wound Cleanser 2 x Per Week/30 Days ?Discharge Instructions: Wash your hands with soap and water. Remove old dressing, discard into plastic bag and place into trash. ?Cleanse the wound with Wound Cleanser prior to applying a clean dressing using gauze sponges, not tissues or cotton balls. Do not ?scrub or use excessive force. Pat dry using gauze sponges, not tissue or cotton balls. ?Electronic Signature(s) ?Signed: 04/30/2022 4:17:52 PM By: Donnamarie Poag ?Signed: 04/30/2022 4:38:50 PM By: Worthy Keeler PA-C ?Entered ByDonnamarie Poag on 04/30/2022 15:36:12 ?Sara [name], [name] ([id number]) ?-------------------------------------------------------------------------------- ?SuperBill Details ?Patient Name: Sara [name], [name] ?Date of Service: 04/30/2022 ?Medical Record Number: [id number] ?Patient Account Number: [id number] ?Date of Birth/Sex: 04-16-1966 (56 y.o. 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Electronic Signature(s) ?Signed: 04/30/2022 4:17:52 PM By: Donnamarie Poag ?Signed: 04/30/2022 4:38:50 PM By: Worthy Keeler PA-C ?Entered ByDonnamarie Poag on 04/30/2022 15:36:46 ?

## 2022-05-01 ENCOUNTER — Ambulatory Visit: Payer: Medicaid Other

## 2022-05-03 ENCOUNTER — Encounter: Payer: Medicaid Other | Admitting: Physician Assistant

## 2022-05-03 DIAGNOSIS — L98492 Non-pressure chronic ulcer of skin of other sites with fat layer exposed: Secondary | ICD-10-CM | POA: Diagnosis not present

## 2022-05-03 DIAGNOSIS — T8131XA Disruption of external operation (surgical) wound, not elsewhere classified, initial encounter: Secondary | ICD-10-CM | POA: Diagnosis not present

## 2022-05-03 DIAGNOSIS — L98495 Non-pressure chronic ulcer of skin of other sites with muscle involvement without evidence of necrosis: Secondary | ICD-10-CM | POA: Diagnosis not present

## 2022-05-03 NOTE — Progress Notes (Signed)
Sara [name]. ([id number]) ?Visit Report for 05/03/2022 ?Arrival Information Details ?Patient Name: Sara [name], Sara [name]. ?Date of Service: 05/03/2022 12:30 PM ?Medical Record Number: [id number] ?Patient Account Number: [id number] ?Date of Birth/Sex: 08-20-1966 (56 y.o. F) ?Treating RN: Huel Coventry ?Primary Care Tatanisha Cuthbert: Meryl Crutch Other Clinician: ?Referring Liller Yohn: Meryl Crutch ?Treating Marisal Swarey/Extender: Allen Derry ?Weeks in Treatment: 3 ?Visit Information History Since Last Visit ?Added or deleted any medications: No ?Patient Arrived: Wheel Chair ?Has Dressing in Place as Prescribed: Yes ?Arrival Time: 12:51 ?Pain Present Now: No ?Accompanied By: sister ?Transfer Assistance: None ?Patient Identification Verified: Yes ?Secondary Verification Process Completed: Yes ?Patient Requires Transmission-Based Precautions: No ?Patient Has Alerts: Yes ?Patient Alerts: NOT diabetic ?Electronic Signature(s) ?Signed: 05/03/2022 4:31:29 PM By: Elliot Gurney, BSN, RN, CWS, Kim RN, BSN ?Entered By: Elliot Gurney, BSN, RN, CWS, Kim on 05/03/2022 12:51:28 ?Sara [name]. ([id number]) ?-------------------------------------------------------------------------------- ?Clinic Level of Care Assessment Details ?Patient Name: Sara [name]. ?Date of Service: 05/03/2022 12:30 PM ?Medical Record Number: [id number] ?Patient Account Number: [id number] ?Date of Birth/Sex: 01-13-1966 (56 y.o. F) ?Treating RN: Huel Coventry ?Primary Care Raynard Mapps: Meryl Crutch Other Clinician: ?Referring Delane Wessinger: Meryl Crutch ?Treating Verley Pariseau/Extender: Allen Derry ?Weeks in Treatment: 3 ?Clinic Level of Care Assessment Items ?TOOL 4 Quantity Score ?[]  - Use when only an EandM is performed on FOLLOW-UP visit 0 ?ASSESSMENTS - Nursing Assessment / Reassessment ?X - Reassessment of Co-morbidities (includes updates in patient status) 1 10 ?X- 1 5 ?Reassessment of Adherence to Treatment Plan ?ASSESSMENTS - Wound and Skin Assessment / Reassessment ?[]  - Simple Wound  Assessment / Reassessment - one wound 0 ?X- 2 5 ?Complex Wound Assessment / Reassessment - multiple wounds ?[]  - 0 ?Dermatologic / Skin Assessment (not related to wound area) ?ASSESSMENTS - Focused Assessment ?[]  - Circumferential Edema Measurements - multi extremities 0 ?[]  - 0 ?Nutritional Assessment / Counseling / Intervention ?[]  - 0 ?Lower Extremity Assessment (monofilament, tuning fork, pulses) ?[]  - 0 ?Peripheral Arterial Disease Assessment (using hand held doppler) ?ASSESSMENTS - Ostomy and/or Continence Assessment and Care ?[]  - Incontinence Assessment and Management 0 ?[]  - 0 ?Ostomy Care Assessment and Management (repouching, etc.) ?PROCESS - Coordination of Care ?X - Simple Patient / Family Education for ongoing care 1 15 ?[]  - 0 ?Complex (extensive) Patient / Family Education for ongoing care ?X- 1 10 ?Staff obtains Consents, Records, Test Results / Process Orders ?[]  - 0 ?Staff telephones HHA, Nursing Homes / Clarify orders / etc ?[]  - 0 ?Routine Transfer to another Facility (non-emergent condition) ?[]  - 0 ?Routine Hospital Admission (non-emergent condition) ?[]  - 0 ?New Admissions / / Ordering NPWT, Apligraf, etc. ?[]  - 0 ?Emergency Hospital Admission (emergent condition) ?X- 1 10 ?Simple Discharge Coordination ?[]  - 0 ?Complex (extensive) Discharge Coordination ?PROCESS - Special Needs ?[]  - Pediatric / Minor Patient Management 0 ?[]  - 0 ?Isolation Patient Management ?[]  - 0 ?Hearing / Language / Visual special needs ?[]  - 0 ?Assessment of Community assistance (transportation, D/C planning, etc.) ?[]  - 0 ?Additional assistance / Altered mentation ?[]  - 0 ?Support Surface(s) Assessment (bed, cushion, seat, etc.) ?INTERVENTIONS - Wound Cleansing / Measurement ?Sculley, Loryn [name]. ( ) ?[]  - 0 ?Simple Wound Cleansing - one wound ?X- 2 5 ?Complex Wound Cleansing - multiple wounds ?X- 1 5 ?Wound Imaging (photographs - any number of wounds) ?[]  - 0 ?Wound Tracing (instead of  photographs) ?[]  - 0 ?Simple Wound Measurement - one wound ?X- 2 5 ?Complex Wound Measurement - multiple wounds ?INTERVENTIONS - Wound Dressings ?[]  - Small Wound  Dressing one or multiple wounds 0 ?[]  - 0 ?Medium Wound Dressing one or multiple wounds ?X- 1 20 ?Large Wound Dressing one or multiple wounds ?[]  - 0 ?Application of Medications - topical ?[]  - 0 ?Application of Medications - injection ?INTERVENTIONS - Miscellaneous ?[]  - External ear exam 0 ?[]  - 0 ?Specimen Collection (cultures, biopsies, blood, body fluids, etc.) ?[]  - 0 ?Specimen(s) / Culture(s) sent or taken to Lab for analysis ?[]  - 0 ?Patient Transfer (multiple staff / / Similar devices) ?[]  - 0 ?Simple Staple / Suture removal (25 or less) ?[]  - 0 ?Complex Staple / Suture removal (26 or more) ?[]  - 0 ?Hypo / Hyperglycemic Management (close monitor of Blood Glucose) ?[]  - 0 ?Ankle / Brachial Index (ABI) - do not check if billed separately ?X- 1 5 ?Vital Signs ?Has the patient been seen at the hospital within the last three years: Yes ?Total Score: 110 ?Level Of Care: New/Established - Level ?3 ?Electronic Signature(s) ?Signed: 05/03/2022 4:31:29 PM By: , BSN, RN, CWS, Kim RN, BSN ?Entered By: , BSN, RN, CWS, Kim on 05/03/2022 13:41:14 ?Sara [name]. ( ) ?-------------------------------------------------------------------------------- ?Encounter Discharge Information Details ?Patient Name: Sara [name]. ?Date of Service: 05/03/2022 12:30 PM ?Medical Record Number: Nurse, adult ?Patient Account Number: ?Date of Birth/Sex: 02-08-1966 (56 y.o. F) ?Treating RN: ?Primary Care Donnavin Vandenbrink: 07/03/2022 Other Clinician: ?Referring Anwitha Mapes: Elliot Gurney ?Treating Jannae Fagerstrom/Extender: Elliot Gurney ?Weeks in Treatment: 3 ?Encounter Discharge Information Items ?Discharge Condition: Stable ?Ambulatory Status: Wheelchair ?Discharge Destination: Home ?Transportation: Private Auto ?Accompanied By: sister ?Schedule  Follow-up Appointment: Yes ?Clinical Summary of Care: ?Electronic Signature(s) ?Signed: 05/03/2022 4:31:29 PM By: [id number], BSN, RN, CWS, Kim RN, BSN ?Entered By: 07/03/2022, BSN, RN, CWS, Kim on 05/03/2022 13:42:53 ?Tolle, Mikelle [name]. ([id number]) ?-------------------------------------------------------------------------------- ?Lower Extremity Assessment Details ?Patient Name: Megill, Patrice [name]. ?Date of Service: 05/03/2022 12:30 PM ?Medical Record Number: [id number] ?Patient Account Number: Huel Coventry ?Date of Birth/Sex: Sep 17, 1966 (56 y.o. F) ?Treating RN: Allen Derry ?Primary Care Leomia Blake: 07/03/2022 Other Clinician: ?Referring Melanee Cordial: Elliot Gurney ?Treating Caren Garske/Extender: Elliot Gurney ?Weeks in Treatment: 3 ?Electronic Signature(s) ?Signed: 05/03/2022 4:31:29 PM By: [id number], BSN, RN, CWS, Kim RN, BSN ?Entered By: 07/03/2022, BSN, RN, CWS, Kim on 05/03/2022 13:09:18 ?Decoteau, Ashlea [name]. ([id number]) ?-------------------------------------------------------------------------------- ?Multi Wound Chart Details ?Patient Name: Tenbrink, Lavra [name]. ?Date of Service: 05/03/2022 12:30 PM ?Medical Record Number: [id number] ?Patient Account Number: Huel Coventry ?Date of Birth/Sex: [birth year] (56 y.o. F) ?Treating RN: Allen Derry ?Primary Care Kaisen Ackers: 07/03/2022 Other Clinician: ?Referring Greysin Medlen: Elliot Gurney ?Treating Nixon Kolton/Extender: Elliot Gurney ?Weeks in Treatment: 3 ?Vital Signs ?Height(in): 65 ?Pulse(bpm): 79 ?Weight(lbs): 220 ?Blood Pressure(mmHg): 100/68 ?Body Mass Index(BMI): 36.6 ?Temperature(??F): 98.2 ?Respiratory Rate(breaths/min): 18 ?Photos: [N/[name]:N/[name]] ?Wound Location: Abdomen - midline Distal, Midline Abdomen - midline N/[name] ?Wounding Event: Surgical Injury Surgical Injury N/[name] ?Primary Etiology: Open Surgical Wound Dehisced Wound N/[name] ?Comorbid History: Asthma, Arrhythmia Asthma, Arrhythmia N/[name] ?Date Acquired: 03/25/2022 04/26/2022 N/[name] ?Weeks of Treatment: 3 0 N/[name] ?Wound Status: Open Open N/[name] ?Wound Recurrence: No No  N/[name] ?Measurements L x W x D (cm) 8.5x14x0.2 0.3x0.5x1.2 N/[name] ?Area (cm?) : 93.462 0.118 N/[name] ?Volume (cm?) : 18.692 0.141 N/[name] ?% Reduction in Area: 9.30% 99.00% N/[name] ?% Reduction in Volume: 87.00% 99.00% N/[name] ?Clas
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
## 2022-05-06 ENCOUNTER — Ambulatory Visit: Payer: Medicaid Other

## 2022-05-07 ENCOUNTER — Ambulatory Visit: Payer: Medicaid Other

## 2022-05-07 ENCOUNTER — Telehealth: Payer: Self-pay | Admitting: Gastroenterology

## 2022-05-07 ENCOUNTER — Other Ambulatory Visit: Payer: Self-pay

## 2022-05-07 NOTE — Telephone Encounter (Signed)
Patient states she already had Colostomy taken out and does not need this appointment. She state she is not having no GI issues  ?

## 2022-05-07 NOTE — Telephone Encounter (Signed)
Patient is wondering if she still needs her appointment tomorrow. States she has already had her procedure. Requesting call back.  ?

## 2022-05-08 ENCOUNTER — Ambulatory Visit: Payer: Medicaid Other

## 2022-05-08 ENCOUNTER — Ambulatory Visit: Payer: Medicaid Other | Admitting: Gastroenterology

## 2022-05-10 ENCOUNTER — Encounter: Payer: Medicaid Other | Admitting: Physician Assistant

## 2022-05-10 DIAGNOSIS — T8131XA Disruption of external operation (surgical) wound, not elsewhere classified, initial encounter: Secondary | ICD-10-CM | POA: Diagnosis not present

## 2022-05-10 DIAGNOSIS — L98492 Non-pressure chronic ulcer of skin of other sites with fat layer exposed: Secondary | ICD-10-CM | POA: Diagnosis not present

## 2022-05-10 DIAGNOSIS — L98495 Non-pressure chronic ulcer of skin of other sites with muscle involvement without evidence of necrosis: Secondary | ICD-10-CM | POA: Diagnosis not present

## 2022-05-10 NOTE — Progress Notes (Signed)
[name], [name] ([id number]) ?Visit Report for 05/10/2022 ?Arrival Information Details ?Patient Name: Sara [name], [name] ?Date of Service: 05/10/2022 3:45 PM ?Medical Record Number: [id number] ?Patient Account Number: [id number] ?Date of Birth/Sex: 08-27-1966 (56 y.o. F) ?Treating RN: Levora Dredge ?Primary Care Ruhi Kopke: Margurite Auerbach Other Clinician: ?Referring Sorah Falkenstein: Margurite Auerbach ?Treating Ethleen Lormand/Extender: Jeri Cos ?Weeks in Treatment: 4 ?Visit Information History Since Last Visit ?Added or deleted any medications: No ?Patient Arrived: Wheel Chair ?Any new allergies or adverse reactions: No ?Arrival Time: 15:45 ?Had a fall or experienced change in No ?Accompanied By: Omara ?activities of daily living that may affect ?Transfer Assistance: None ?risk of falls: ?Patient Identification Verified: Yes ?Hospitalized since last visit: No ?Secondary Verification Process Completed: Yes ?Has Dressing in Place as Prescribed: Yes ?Patient Requires Transmission-Based Precautions: No ?Pain Present Now: Yes ?Patient Has Alerts: Yes ?Patient Alerts: NOT diabetic ?Electronic Signature(s) ?Signed: 05/10/2022 4:35:33 PM By: Levora Dredge ?Entered By: Levora Dredge on 05/10/2022 15:46:35 ?Dunlow, Dajha A. ([id number]) ?-------------------------------------------------------------------------------- ?Clinic Level of Care Assessment Details ?Patient Name: [name], [name] ?Date of Service: 05/10/2022 3:45 PM ?Medical Record Number: [id number] ?Patient Account Number: [id number] ?Date of Birth/Sex: 1966-08-07 (56 y.o. F) ?Treating RN: Levora Dredge ?Primary Care Shahla Betsill: Margurite Auerbach Other Clinician: ?Referring Jayvier Burgher: Margurite Auerbach ?Treating Rui Wordell/Extender: Jeri Cos ?Weeks in Treatment: 4 ?Clinic Level of Care Assessment Items ?TOOL 1 Quantity Score ?[]  - Use when EandM and Procedure is performed on INITIAL visit 0 ?ASSESSMENTS - Nursing Assessment / Reassessment ?[]  - General Physical Exam (combine w/  comprehensive assessment (listed just below) when performed on new ?0 ?pt. evals) ?[]  - 0 ?Comprehensive Assessment (HX, ROS, Risk Assessments, Wounds Hx, etc.) ?ASSESSMENTS - Wound and Skin Assessment / Reassessment ?[]  - Dermatologic / Skin Assessment (not related to wound area) 0 ?ASSESSMENTS - Ostomy and/or Continence Assessment and Care ?[]  - Incontinence Assessment and Management 0 ?[]  - 0 ?Ostomy Care Assessment and Management (repouching, etc.) ?PROCESS - Coordination of Care ?[]  - Simple Patient / Family Education for ongoing care 0 ?[]  - 0 ?Complex (extensive) Patient / Family Education for ongoing care ?[]  - 0 ?Staff obtains Consents, Records, Test Results / Process Orders ?[]  - 0 ?Staff telephones HHA, Nursing Homes / Clarify orders / etc ?[]  - 0 ?Routine Transfer to another Facility (non-emergent condition) ?[]  - 0 ?Routine Hospital Admission (non-emergent condition) ?[]  - 0 ?New Admissions / Biomedical engineer / Ordering NPWT, Apligraf, etc. ?[]  - 0 ?Emergency Hospital Admission (emergent condition) ?PROCESS - Special Needs ?[]  - Pediatric / Minor Patient Management 0 ?[]  - 0 ?Isolation Patient Management ?[]  - 0 ?Hearing / Language / Visual special needs ?[]  - 0 ?Assessment of Community assistance (transportation, D/C planning, etc.) ?[]  - 0 ?Additional assistance / Altered mentation ?[]  - 0 ?Support Surface(s) Assessment (bed, cushion, seat, etc.) ?INTERVENTIONS - Miscellaneous ?[]  - External ear exam 0 ?[]  - 0 ?Patient Transfer (multiple staff / Civil Service fast streamer / Similar devices) ?[]  - 0 ?Simple Staple / Suture removal (25 or less) ?[]  - 0 ?Complex Staple / Suture removal (26 or more) ?[]  - 0 ?Hypo/Hyperglycemic Management (do not check if billed separately) ?[]  - 0 ?Ankle / Brachial Index (ABI) - do not check if billed separately ?Has the patient been seen at the hospital within the last three years: Yes ?Total Score: 0 ?Level Of Care: ____ ?Strege, Jyla A. ([id number]) ?Electronic  Signature(s) ?Signed: 05/10/2022 4:35:33 PM By: Levora Dredge ?Entered By: Levora Dredge on 05/10/2022 16:18:11 ?Friske, Yulitza A. ([id number]) ?-------------------------------------------------------------------------------- ?Encounter Discharge Information Details ?  Patient Name: [name], [name] ?Date of Service: 05/10/2022 3:45 PM ?Medical Record Number: [id number] ?Patient Account Number: [id number] ?Date of Birth/Sex: 07/28/1966 (56 y.o. F) ?Treating RN: Levora Dredge ?Primary Care Nami Strawder: Margurite Auerbach Other Clinician: ?Referring Ozzie Remmers: Margurite Auerbach ?Treating Victorino Fatzinger/Extender: Jeri Cos ?Weeks in Treatment: 4 ?Encounter Discharge Information Items ?Discharge Condition: Stable ?Ambulatory Status: Wheelchair ?Discharge Destination: Home ?Transportation: Private Auto ?Accompanied By: Vicario ?Schedule Follow-up Appointment: Yes ?Clinical Summary of Care: Patient Declined ?Electronic Signature(s) ?Signed: 05/10/2022 4:19:15 PM By: Levora Dredge ?Entered By: Levora Dredge on 05/10/2022 16:19:15 ?Binsfeld, Jewelianna A. ([id number]) ?-------------------------------------------------------------------------------- ?Lower Extremity Assessment Details ?Patient Name: [name], [name] ?Date of Service: 05/10/2022 3:45 PM ?Medical Record Number: [id number] ?Patient Account Number: [id number] ?Date of Birth/Sex: November 03, 1966 (56 y.o. F) ?Treating RN: Levora Dredge ?Primary Care Phoenyx Melka: Margurite Auerbach Other Clinician: ?Referring Arriyanna Mersch: Margurite Auerbach ?Treating Catheleen Langhorne/Extender: Jeri Cos ?Weeks in Treatment: 4 ?Electronic Signature(s) ?Signed: 05/10/2022 4:35:33 PM By: Levora Dredge ?Entered By: Levora Dredge on 05/10/2022 15:55:43 ?Pedley, Cyd A. ([id number]) ?-------------------------------------------------------------------------------- ?Multi Wound Chart Details ?Patient Name: [name], [name] ?Date of Service: 05/10/2022 3:45 PM ?Medical Record Number: [id number] ?Patient Account Number:  [id number] ?Date of Birth/Sex: 1966/06/11 (56 y.o. F) ?Treating RN: Levora Dredge ?Primary Care Edrick Whitehorn: Margurite Auerbach Other Clinician: ?Referring Wayland Baik: Margurite Auerbach ?Treating Mandela Bello/Extender: Jeri Cos ?Weeks in Treatment: 4 ?Vital Signs ?Height(in): 65 ?Pulse(bpm): 77 ?Weight(lbs): 220 ?Blood Pressure(mmHg): 112/69 ?Body Mass Index(BMI): 36.6 ?Temperature(??F): 98.2 ?Respiratory Rate(breaths/min): 18 ?Photos: [N/A:N/A] ?Wound Location: Abdomen - midline Distal, Midline Abdomen - midline N/A ?Wounding Event: Surgical Injury Surgical Injury N/A ?Primary Etiology: Open Surgical Wound Dehisced Wound N/A ?Comorbid History: Asthma, Arrhythmia Asthma, Arrhythmia N/A ?Date Acquired: 03/25/2022 04/26/2022 N/A ?Weeks of Treatment: 4 1 N/A ?Wound Status: Open Open N/A ?Wound Recurrence: No No N/A ?Measurements L x W x D (cm) 6.9x12x0.1 0.1x0.1x0.1 N/A ?Area (cm?) : 65.031 0.008 N/A ?Volume (cm?) : 6.503 0.001 N/A ?% Reduction in Area: 36.90% 93.20% N/A ?% Reduction in Volume: 95.50% 99.30% N/A ?Classification: Full Thickness With Exposed Full Thickness Without Exposed N/A ?Support Structures Support Structures ?Exudate Amount: Large None Present N/A ?Exudate Type: Serosanguineous N/A N/A ?Exudate Color: red, brown N/A N/A ?Wound Margin: N/A Flat and Intact N/A ?Granulation Amount: Large (67-100%) None Present (0%) N/A ?Granulation Quality: Red, Hyper-granulation N/A N/A ?Necrotic Amount: Small (1-33%) None Present (0%) N/A ?Exposed Structures: ?Fat Layer (Subcutaneous Tissue): ?Fat Layer (Subcutaneous Tissue): N/A ?Yes No ?Fascia: No ?Tendon: No ?Muscle: No ?Joint: No ?Bone: No ?Epithelialization: Small (1-33%) Large (67-100%) N/A ?Treatment Notes ?Electronic Signature(s) ?Signed: 05/10/2022 4:35:33 PM By: Levora Dredge ?Entered By: Levora Dredge on 05/10/2022 15:58:08 ?Hoben, Joseph A. ([id number]) ?-------------------------------------------------------------------------------- ?Multi-Disciplinary Care  Plan Details ?Patient Name: Oboyle, Raahi A. ?Date of Service: 05/10/2022 3:45 PM ?Medical Record Number: [id number] ?Patient Account Number: [id number] ?Date of Birth/Sex: 01/04/1966 (56 y.o. F) ?Treating RN: Levora Dredge ?Prima
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
## 2022-05-13 ENCOUNTER — Ambulatory Visit: Payer: Medicaid Other | Attending: Physical Medicine and Rehabilitation

## 2022-05-14 ENCOUNTER — Ambulatory Visit: Payer: Medicaid Other

## 2022-05-15 ENCOUNTER — Ambulatory Visit: Payer: Medicaid Other

## 2022-05-17 ENCOUNTER — Ambulatory Visit: Payer: Medicaid Other | Admitting: Physician Assistant

## 2022-05-20 ENCOUNTER — Inpatient Hospital Stay
Admission: EM | Admit: 2022-05-20 | Discharge: 2022-05-24 | DRG: 690 | Disposition: A | Discharge status: Home / Self Care | Payer: Medicaid Other | Attending: Hospitalist | Admitting: Hospitalist

## 2022-05-20 ENCOUNTER — Ambulatory Visit: Payer: Medicaid Other

## 2022-05-20 ENCOUNTER — Other Ambulatory Visit: Payer: Self-pay

## 2022-05-20 ENCOUNTER — Emergency Department: Payer: Medicaid Other

## 2022-05-20 DIAGNOSIS — Y838 Other surgical procedures as the cause of abnormal reaction of the patient, or of later complication, without mention of misadventure at the time of the procedure: Secondary | ICD-10-CM | POA: Diagnosis present

## 2022-05-20 DIAGNOSIS — Z6836 Body mass index (BMI) 36.0-36.9, adult: Secondary | ICD-10-CM | POA: Diagnosis not present

## 2022-05-20 DIAGNOSIS — N12 Tubulo-interstitial nephritis, not specified as acute or chronic: Secondary | ICD-10-CM | POA: Diagnosis present

## 2022-05-20 DIAGNOSIS — Z881 Allergy status to other antibiotic agents status: Secondary | ICD-10-CM | POA: Diagnosis not present

## 2022-05-20 DIAGNOSIS — Z79899 Other long term (current) drug therapy: Secondary | ICD-10-CM | POA: Diagnosis not present

## 2022-05-20 DIAGNOSIS — Z803 Family history of malignant neoplasm of breast: Secondary | ICD-10-CM

## 2022-05-20 DIAGNOSIS — Z888 Allergy status to other drugs, medicaments and biological substances status: Secondary | ICD-10-CM

## 2022-05-20 DIAGNOSIS — N10 Acute tubulo-interstitial nephritis: Secondary | ICD-10-CM | POA: Diagnosis not present

## 2022-05-20 DIAGNOSIS — Z87891 Personal history of nicotine dependence: Secondary | ICD-10-CM

## 2022-05-20 DIAGNOSIS — S31109A Unspecified open wound of abdominal wall, unspecified quadrant without penetration into peritoneal cavity, initial encounter: Secondary | ICD-10-CM | POA: Diagnosis not present

## 2022-05-20 DIAGNOSIS — R5383 Other fatigue: Secondary | ICD-10-CM | POA: Diagnosis present

## 2022-05-20 DIAGNOSIS — E669 Obesity, unspecified: Secondary | ICD-10-CM | POA: Diagnosis present

## 2022-05-20 DIAGNOSIS — I700 Atherosclerosis of aorta: Secondary | ICD-10-CM | POA: Diagnosis not present

## 2022-05-20 DIAGNOSIS — D649 Anemia, unspecified: Secondary | ICD-10-CM | POA: Diagnosis present

## 2022-05-20 DIAGNOSIS — N179 Acute kidney failure, unspecified: Secondary | ICD-10-CM | POA: Diagnosis not present

## 2022-05-20 DIAGNOSIS — R197 Diarrhea, unspecified: Secondary | ICD-10-CM | POA: Diagnosis not present

## 2022-05-20 DIAGNOSIS — F32A Depression, unspecified: Secondary | ICD-10-CM | POA: Diagnosis not present

## 2022-05-20 DIAGNOSIS — Z20822 Contact with and (suspected) exposure to covid-19: Secondary | ICD-10-CM | POA: Diagnosis not present

## 2022-05-20 DIAGNOSIS — N151 Renal and perinephric abscess: Secondary | ICD-10-CM | POA: Diagnosis not present

## 2022-05-20 DIAGNOSIS — A419 Sepsis, unspecified organism: Principal | ICD-10-CM

## 2022-05-20 DIAGNOSIS — T8189XD Other complications of procedures, not elsewhere classified, subsequent encounter: Secondary | ICD-10-CM | POA: Diagnosis not present

## 2022-05-20 DIAGNOSIS — R5381 Other malaise: Secondary | ICD-10-CM | POA: Diagnosis not present

## 2022-05-20 DIAGNOSIS — E876 Hypokalemia: Secondary | ICD-10-CM | POA: Diagnosis not present

## 2022-05-20 DIAGNOSIS — N390 Urinary tract infection, site not specified: Secondary | ICD-10-CM | POA: Diagnosis not present

## 2022-05-20 LAB — RESP PANEL BY RT-PCR (FLU A&B, COVID) ARPGX2
Influenza A by PCR: NEGATIVE
Influenza B by PCR: NEGATIVE
SARS Coronavirus 2 by RT PCR: NEGATIVE

## 2022-05-20 LAB — CBC
HCT: 39.2 % (ref 36.0–46.0)
Hemoglobin: 11.9 g/dL — ABNORMAL LOW (ref 12.0–15.0)
MCH: 27.4 pg (ref 26.0–34.0)
MCHC: 30.4 g/dL (ref 30.0–36.0)
MCV: 90.3 fL (ref 80.0–100.0)
Platelets: 169 10*3/uL (ref 150–400)
RBC: 4.34 MIL/uL (ref 3.87–5.11)
RDW: 15 % (ref 11.5–15.5)
WBC: 9.4 10*3/uL (ref 4.0–10.5)
nRBC: 0 % (ref 0.0–0.2)

## 2022-05-20 LAB — URINALYSIS, ROUTINE W REFLEX MICROSCOPIC
Bilirubin Urine: NEGATIVE
Glucose, UA: NEGATIVE mg/dL
Ketones, ur: NEGATIVE mg/dL
Nitrite: POSITIVE — AB
Protein, ur: 100 mg/dL — AB
Specific Gravity, Urine: 1.034 — ABNORMAL HIGH (ref 1.005–1.030)
WBC, UA: >50 WBC/hpf — ABNORMAL HIGH (ref 0–5)
pH: 7 (ref 5.0–8.0)

## 2022-05-20 LAB — COMPREHENSIVE METABOLIC PANEL
ALT: 15 U/L (ref 0–44)
AST: 26 U/L (ref 15–41)
Albumin: 2.8 g/dL — ABNORMAL LOW (ref 3.5–5.0)
Alkaline Phosphatase: 65 U/L (ref 38–126)
Anion gap: 13 (ref 5–15)
BUN: 12 mg/dL (ref 6–20)
CO2: 30 mmol/L (ref 22–32)
Calcium: 8.2 mg/dL — ABNORMAL LOW (ref 8.9–10.3)
Chloride: 95 mmol/L — ABNORMAL LOW (ref 98–111)
Creatinine, Ser: 1.14 mg/dL — ABNORMAL HIGH (ref 0.44–1.00)
GFR, Estimated: 57 mL/min — ABNORMAL LOW (ref >60)
Glucose, Bld: 115 mg/dL — ABNORMAL HIGH (ref 70–99)
Potassium: 2.3 mmol/L — CL (ref 3.5–5.1)
Sodium: 138 mmol/L (ref 135–145)
Total Bilirubin: 0.6 mg/dL (ref 0.3–1.2)
Total Protein: 7.1 g/dL (ref 6.5–8.1)

## 2022-05-20 LAB — LACTIC ACID, PLASMA: Lactic Acid, Venous: 1.2 mmol/L (ref 0.5–1.9)

## 2022-05-20 LAB — LIPASE, BLOOD: Lipase: 19 U/L (ref 11–51)

## 2022-05-20 LAB — C-REACTIVE PROTEIN: CRP: 27.4 mg/dL — ABNORMAL HIGH (ref <1.0)

## 2022-05-20 LAB — MAGNESIUM: Magnesium: 1.5 mg/dL — ABNORMAL LOW (ref 1.7–2.4)

## 2022-05-20 LAB — HIV ANTIBODY (ROUTINE TESTING W REFLEX): HIV Screen 4th Generation wRfx: NONREACTIVE

## 2022-05-20 MED ORDER — POTASSIUM CHLORIDE CRYS ER 20 MEQ PO TBCR
40.0000 meq | EXTENDED_RELEASE_TABLET | Freq: Two times a day (BID) | ORAL | Status: DC
  Administered 2022-05-20 – 2022-05-21 (×2): 40 meq via ORAL
  Filled 2022-05-20 (×2): qty 2

## 2022-05-20 MED ORDER — SODIUM CHLORIDE 0.9 % IV BOLUS (SEPSIS)
1000.0000 mL | Freq: Once | INTRAVENOUS | Status: AC
  Administered 2022-05-20: 1000 mL via INTRAVENOUS

## 2022-05-20 MED ORDER — IOHEXOL 350 MG/ML SOLN: 75.0000 mL | Freq: Once | INTRAVENOUS | Status: DC | PRN

## 2022-05-20 MED ORDER — HYDROCODONE-ACETAMINOPHEN 5-325 MG PO TABS: 1.0000 | ORAL_TABLET | Freq: Four times a day (QID) | ORAL | Status: DC | PRN

## 2022-05-20 MED ORDER — MAGNESIUM SULFATE 2 GM/50ML IV SOLN
2.0000 g | Freq: Once | INTRAVENOUS | Status: AC
  Administered 2022-05-20: 2 g via INTRAVENOUS
  Filled 2022-05-20: qty 50

## 2022-05-20 MED ORDER — VANCOMYCIN HCL 2000 MG/400ML IV SOLN
2000.0000 mg | Freq: Once | INTRAVENOUS | Status: AC
  Administered 2022-05-20: 2000 mg via INTRAVENOUS
  Filled 2022-05-20: qty 400

## 2022-05-20 MED ORDER — VANCOMYCIN HCL IN DEXTROSE 1-5 GM/200ML-% IV SOLN: 1000.0000 mg | INTRAVENOUS | Status: DC

## 2022-05-20 MED ORDER — LACTATED RINGERS IV SOLN: INTRAVENOUS | Status: AC

## 2022-05-20 MED ORDER — ONDANSETRON HCL 4 MG/2ML IJ SOLN
4.0000 mg | Freq: Once | INTRAMUSCULAR | Status: AC
  Administered 2022-05-20: 4 mg via INTRAVENOUS
  Filled 2022-05-20: qty 2

## 2022-05-20 MED ORDER — POTASSIUM CHLORIDE 10 MEQ/100ML IV SOLN
10.0000 meq | INTRAVENOUS | Status: AC
  Administered 2022-05-20 (×3): 10 meq via INTRAVENOUS
  Filled 2022-05-20 (×6): qty 100

## 2022-05-20 MED ORDER — VORTIOXETINE HBR 5 MG PO TABS
20.0000 mg | ORAL_TABLET | Freq: Every day | ORAL | Status: DC
  Administered 2022-05-21 – 2022-05-24 (×4): 20 mg via ORAL
  Filled 2022-05-20 (×5): qty 4

## 2022-05-20 MED ORDER — ACETAMINOPHEN 325 MG PO TABS: 650.0000 mg | ORAL_TABLET | Freq: Three times a day (TID) | ORAL | Status: DC | PRN

## 2022-05-20 MED ORDER — SODIUM CHLORIDE 0.9 % IV SOLN
2.0000 g | Freq: Three times a day (TID) | INTRAVENOUS | Status: DC
  Administered 2022-05-20 – 2022-05-23 (×8): 2 g via INTRAVENOUS
  Filled 2022-05-20: qty 2
  Filled 2022-05-20 (×8): qty 12.5
  Filled 2022-05-20: qty 2

## 2022-05-20 MED ORDER — IOHEXOL 300 MG/ML  SOLN
100.0000 mL | Freq: Once | INTRAMUSCULAR | Status: AC | PRN
  Administered 2022-05-20: 100 mL via INTRAVENOUS

## 2022-05-20 MED ORDER — VANCOMYCIN HCL IN DEXTROSE 1-5 GM/200ML-% IV SOLN: 1000.0000 mg | Freq: Once | INTRAVENOUS | Status: DC

## 2022-05-20 MED ORDER — ENOXAPARIN SODIUM 60 MG/0.6ML IJ SOSY
50.0000 mg | PREFILLED_SYRINGE | INTRAMUSCULAR | Status: DC
  Administered 2022-05-20 – 2022-05-23 (×4): 50 mg via SUBCUTANEOUS
  Filled 2022-05-20 (×4): qty 0.6

## 2022-05-20 MED ORDER — METRONIDAZOLE 500 MG/100ML IV SOLN
500.0000 mg | Freq: Once | INTRAVENOUS | Status: AC
  Administered 2022-05-20: 500 mg via INTRAVENOUS
  Filled 2022-05-20: qty 100

## 2022-05-20 MED ORDER — SODIUM CHLORIDE 0.9 % IV SOLN
2.0000 g | Freq: Once | INTRAVENOUS | Status: AC
  Administered 2022-05-20: 2 g via INTRAVENOUS
  Filled 2022-05-20: qty 12.5

## 2022-05-20 MED ORDER — POTASSIUM CHLORIDE 20 MEQ PO PACK
40.0000 meq | PACK | Freq: Two times a day (BID) | ORAL | Status: DC
  Administered 2022-05-20: 40 meq via ORAL
  Filled 2022-05-20: qty 2

## 2022-05-20 MED ORDER — LACTATED RINGERS IV SOLN: INTRAVENOUS | Status: DC

## 2022-05-20 NOTE — Consult Note (Signed)
[facility] Nurse Consult Note: Reason for Consult:Midline, distal full thickness abdominal wound. Followed by Orlan Leavens, III.  Last seen by that Provider on 05/03/22. Wound type:Healing surgical wound Pressure Injury POA: N/A Measurement:(05/03/22): 6.9cm x 12cm x 0.1cm Wound bed:red, moist Drainage (amount, consistency, odor) moderate serous Periwound:with evidence of previous wouind healing Dressing procedure/placement/frequency:I will continue the POC implemented and overseen by Mr. Joaquim Lai, III using a silver hydrofiber (Aquacel Ag+ Advantage) dressing topped with an ABD pad and secured with either tape or an abdominal binder.  Daily changes.  Patient is to continue with follow up visits by the outpatient wound care center as instructed.   [facility] nursing team will not follow, but will remain available to this patient, the nursing and medical teams.  Please re-consult if needed. Thanks, Maudie Flakes, MSN, RN, [place], Arther Abbott  Pager# [phone]

## 2022-05-20 NOTE — ED Provider Notes (Signed)
[facility] Provider Note    Event Date/Time   First MD Initiated Contact with Patient 05/20/22 1151     (approximate)   History   Headache and Wound Check   HPI  Sara [name] is a 56 y.o. female  here with generalized weakness, nausea, and wound check. Pt reports that over the past week, she has had progressively worsening nausea, vomiting. States she has had poor appetite. Has not been able to keep food down. She's had associated decreased UOP, though denies dysuria. Reports some mild associated generalized abd pain. She's had loose stool but this is not necessarily abnormal. No med changes. No ABX use.      Physical Exam   Triage Vital Signs: ED Triage Vitals  Enc Vitals Group     BP 05/20/22 0944 (!) 95/59     Pulse Rate 05/20/22 0944 80     Resp 05/20/22 0944 18     Temp 05/20/22 0944 99.3 F (37.4 C)     Temp Source 05/20/22 0944 Oral     SpO2 05/20/22 0944 94 %     Weight --      Height --      Head Circumference --      Peak Flow --      Pain Score 05/20/22 0952 7     Pain Loc --      Pain Edu? --      Excl. in GC? --     Most recent vital signs: Vitals:   05/20/22 1638 05/20/22 1747  BP: (!) 95/58 (!) 105/55  Pulse: 83 77  Resp: 14 18  Temp:  98 F (36.7 C)  SpO2: 95% 98%     General: Awake, no distress.  CV:  Good peripheral perfusion. Resp:  Normal effort. Lungs CTAB. No w/r/r. Abd:  No distention. Minimal tenderness to the lower abdomen. Chronic appearing wound to the mid abdomen with small amount of purulence around edges, but no overt induration or fluctuance. Other:  Mildly dry MM, poor skin turgor.   ED Results / Procedures / Treatments   Labs (all labs ordered are listed, but only abnormal results are displayed) Labs Reviewed  COMPREHENSIVE METABOLIC PANEL - Abnormal; Notable for the following components:      Result Value   Potassium 2.3 (*)    Chloride 95 (*)    Glucose, Bld 115 (*)    Creatinine,  Ser 1.14 (*)    Calcium 8.2 (*)    Albumin 2.8 (*)    GFR, Estimated 57 (*)    All other components within normal limits  CBC - Abnormal; Notable for the following components:   Hemoglobin 11.9 (*)    All other components within normal limits  URINALYSIS, ROUTINE W REFLEX MICROSCOPIC - Abnormal; Notable for the following components:   Color, Urine YELLOW (*)    APPearance HAZY (*)    Specific Gravity, Urine 1.034 (*)    Hgb urine dipstick MODERATE (*)    Protein, ur 100 (*)    Nitrite POSITIVE (*)    Leukocytes,Ua LARGE (*)    WBC, UA >50 (*)    Bacteria, UA RARE (*)    All other components within normal limits  MAGNESIUM - Abnormal; Notable for the following components:   Magnesium 1.5 (*)    All other components within normal limits  RESP PANEL BY RT-PCR (FLU A&B, COVID) ARPGX2  CULTURE, BLOOD (ROUTINE X 2)  CULTURE, BLOOD (ROUTINE X 2)  URINE CULTURE  LIPASE, BLOOD  LACTIC ACID, PLASMA  C-REACTIVE PROTEIN  HIV ANTIBODY (ROUTINE TESTING W REFLEX)  BASIC METABOLIC PANEL  CBC  MAGNESIUM  C-REACTIVE PROTEIN  HIV ANTIBODY (ROUTINE TESTING W REFLEX)     EKG Normal sinus rhythm, VR 73. PR 130, QRS 88, QTc 459. No acute ST elevations or depressions. No ischemia or infarct.   RADIOLOGY CT A/P: Acute left sided pyelo with three small areas c/f developing abscess   I also independently reviewed and agree with radiologist interpretations.   PROCEDURES:  Critical Care performed: Yes, see critical care procedure note(s)   .Critical Care Performed by: Shaune PollackIsaacs, Vesper Trant, MD Authorized by: Shaune PollackIsaacs, Betti Goodenow, MD   Critical care provider statement:    Critical care time (minutes):  30   Critical care time was exclusive of:  Separately billable procedures and treating other patients   Critical care was necessary to treat or prevent imminent or life-threatening deterioration of the following conditions:  Cardiac failure, circulatory failure and respiratory failure   Critical  care was time spent personally by me on the following activities:  Development of treatment plan with patient or surrogate, discussions with consultants, evaluation of patient's response to treatment, examination of patient, ordering and review of laboratory studies, ordering and review of radiographic studies, ordering and performing treatments and interventions, pulse oximetry, re-evaluation of patient's condition and review of old charts   MEDICATIONS ORDERED IN ED: Medications  potassium chloride (KLOR-CON) packet 40 mEq (40 mEq Oral Given 05/20/22 1444)  potassium chloride 10 mEq in 100 mL IVPB (10 mEq Intravenous New Bag/Given 05/20/22 1806)  lactated ringers infusion ( Intravenous New Bag/Given 05/20/22 1804)  acetaminophen (TYLENOL) tablet 650 mg (has no administration in time range)  HYDROcodone-acetaminophen (NORCO/VICODIN) 5-325 MG per tablet 1 tablet (has no administration in time range)  vortioxetine HBr (TRINTELLIX) tablet 20 mg (has no administration in time range)  enoxaparin (LOVENOX) injection 50 mg (has no administration in time range)  vancomycin (VANCOREADY) IVPB 2000 mg/400 mL (2,000 mg Intravenous New Bag/Given 05/20/22 1817)  ceFEPIme (MAXIPIME) 2 g in sodium chloride 0.9 % 100 mL IVPB (has no administration in time range)  vancomycin (VANCOCIN) IVPB 1000 mg/200 mL premix (has no administration in time range)  sodium chloride 0.9 % bolus 1,000 mL (0 mLs Intravenous Stopped 05/20/22 1459)  ceFEPIme (MAXIPIME) 2 g in sodium chloride 0.9 % 100 mL IVPB (0 g Intravenous Stopped 05/20/22 1459)  metroNIDAZOLE (FLAGYL) IVPB 500 mg (0 mg Intravenous Stopped 05/20/22 1612)  magnesium sulfate IVPB 2 g 50 mL (0 g Intravenous Stopped 05/20/22 1658)  ondansetron (ZOFRAN) injection 4 mg (4 mg Intravenous Given 05/20/22 1358)  iohexol (OMNIPAQUE) 300 MG/ML solution 100 mL (100 mLs Intravenous Contrast Given 05/20/22 1331)     IMPRESSION / MDM / ASSESSMENT AND PLAN / ED COURSE  I reviewed the  triage vital signs and the nursing notes.                               The patient is on the cardiac monitor to evaluate for evidence of arrhythmia and/or significant heart rate changes.   Ddx:  Differential includes the following, with pertinent life- or limb-threatening emergencies considered:  Sepsis 2/2 right AOM, wound infection, UTI, viral gastro, colitis or diverticulitis   MDM:  56 yo F here with multiple complaints, primarily fever, nausea, weakness. Initial lab work remarkable for significant hypokalemia and hypomag, which I suspect is  due to her poor PO intake and vomiting. IV and PO rep ordered. CBC overall reassuring. CT A/P obtained, reviewed, and actually shows significant pyelo with question of developing abscesses. None >3cm at this time. Pt does not appear toxic.  Pt started on IV abx, sepsis fluid resuscitation, and electrolytes replaced. Will admit for further stabilization.   MEDICATIONS GIVEN IN ED: Medications  potassium chloride (KLOR-CON) packet 40 mEq (40 mEq Oral Given 05/20/22 1444)  potassium chloride 10 mEq in 100 mL IVPB (10 mEq Intravenous New Bag/Given 05/20/22 1806)  lactated ringers infusion ( Intravenous New Bag/Given 05/20/22 1804)  acetaminophen (TYLENOL) tablet 650 mg (has no administration in time range)  HYDROcodone-acetaminophen (NORCO/VICODIN) 5-325 MG per tablet 1 tablet (has no administration in time range)  vortioxetine HBr (TRINTELLIX) tablet 20 mg (has no administration in time range)  enoxaparin (LOVENOX) injection 50 mg (has no administration in time range)  vancomycin (VANCOREADY) IVPB 2000 mg/400 mL (2,000 mg Intravenous New Bag/Given 05/20/22 1817)  ceFEPIme (MAXIPIME) 2 g in sodium chloride 0.9 % 100 mL IVPB (has no administration in time range)  vancomycin (VANCOCIN) IVPB 1000 mg/200 mL premix (has no administration in time range)  sodium chloride 0.9 % bolus 1,000 mL (0 mLs Intravenous Stopped 05/20/22 1459)  ceFEPIme (MAXIPIME) 2 g  in sodium chloride 0.9 % 100 mL IVPB (0 g Intravenous Stopped 05/20/22 1459)  metroNIDAZOLE (FLAGYL) IVPB 500 mg (0 mg Intravenous Stopped 05/20/22 1612)  magnesium sulfate IVPB 2 g 50 mL (0 g Intravenous Stopped 05/20/22 1658)  ondansetron (ZOFRAN) injection 4 mg (4 mg Intravenous Given 05/20/22 1358)  iohexol (OMNIPAQUE) 300 MG/ML solution 100 mL (100 mLs Intravenous Contrast Given 05/20/22 1331)     Consults:  Hospitalist   EMR reviewed  Reviewed prior Wound notes from [facility] - wound does not appear to be necessarily worse than baseline     FINAL CLINICAL IMPRESSION(S) / ED DIAGNOSES   Final diagnoses:  Sepsis secondary to UTI (HCC)  Hypokalemia  Hypomagnesemia     Rx / DC Orders   ED Discharge Orders     None        Note:  This document was prepared using Dragon voice recognition software and may include unintentional dictation errors.   Shaune Pollack, MD 05/20/22 1900

## 2022-05-20 NOTE — ED Notes (Signed)
Pt to bathroom

## 2022-05-20 NOTE — ED Notes (Signed)
Unable to draw blood cultures. Edp notified.

## 2022-05-20 NOTE — ED Notes (Signed)
Resumed care from bill rn   pt alert  iv meds infusing  family with pt.  Nsr on monitor.

## 2022-05-20 NOTE — ED Notes (Signed)
MD Isaacs informed of pt's K+ of 2.3

## 2022-05-20 NOTE — Progress Notes (Signed)
PHARMACY -  BRIEF ANTIBIOTIC NOTE   Pharmacy has received consult(s) for Cefepime and Vancomycin from an ED provider.  The patient's profile has been reviewed for ht/wt/allergies/indication/available labs.    One time order(s) placed for Cefepime 2g and Vancomycin 1g by ED provider.  Further antibiotics/pharmacy consults should be ordered by admitting physician if indicated.                       Thank you, Foye Deer 05/20/2022  12:21 PM

## 2022-05-20 NOTE — Progress Notes (Signed)
[facility]link following sepsis 

## 2022-05-20 NOTE — Progress Notes (Signed)
CODE SEPSIS - PHARMACY COMMUNICATION  **Broad Spectrum Antibiotics should be administered within 1 hour of Sepsis diagnosis**  Time Code Sepsis Called/Page Received: 12:07  Antibiotics Ordered: Cefepime, Vancomycin, Metronidazole  Time of 1st antibiotic administration: Cefepime given at 13:51   Vira Blanco ,PharmD Clinical Pharmacist  05/20/2022  12:23 PM

## 2022-05-20 NOTE — ED Triage Notes (Signed)
Pt comes with c/o wound infection, headache and ear pain. Pt states this all started last Wednesday. Pt states nausea.

## 2022-05-20 NOTE — Progress Notes (Signed)
Patient states that she can not take the po potassium powder. She prefers the pill potassium instead. Secure chat sent to K. Foust NP. Awaiting orders. Reported by day shift nurse that she could not tolerate the IV potassium runs. Received 2 runs, day shift nurse reported to MD and po powder potassium was ordered.
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
## 2022-05-20 NOTE — Consult Note (Signed)
Pharmacy Antibiotic Note  Sara [name] is a 56 y.o. female admitted on 05/20/2022 with  pyelonephritis with abscesses Pharmacy has been consulted for Vancomycin and cefepime dosing.  Plan: Cefepime 2 gram Q8H Vancomycin 2000 mg LD dose followed by Vancomycin 1000 mg Q24H. Goal AUC 400-550 Estimated AUC 437 Scr 1.14, IBW, Vd 0.5 (BMI 35.9)      Temp (24hrs), Avg:99.3 F (37.4 C), Min:99.3 F (37.4 C), Max:99.3 F (37.4 C)  Recent Labs  Lab 05/20/22 0945 05/20/22 1218  WBC 9.4  --   CREATININE 1.14*  --   LATICACIDVEN  --  1.2    CrCl cannot be calculated (Unknown ideal weight.).    Allergies  Allergen Reactions   Azithromycin Diarrhea and Nausea And Vomiting   Benzalkonium Chloride Other (See Comments)    Pt unsure of allergy    Gabapentin Other (See Comments)    Patient states it "makes her loopy"   Augmentin [Amoxicillin-Pot Clavulanate] Nausea Only   Ciprofloxacin Nausea Only   Neomycin Rash   Neosporin [Bacitracin-Polymyxin B] Itching and Rash   Neosporin [Neomycin-Bacitracin Zn-Polymyx] Rash   Septra [Sulfamethoxazole-Trimethoprim] Nausea Only    Antimicrobials this admission: 5/22 metronidazole >> x 1 5/22 cefepime >>  5/22 Vancomycin >>  Dose adjustments this admission:   Microbiology results: 5/22 BCx: sent 5/22 UCx: sent    Thank you for allowing pharmacy to be a part of this patient's care.  Sharen Hones, PharmD, BCPS Clinical Pharmacist   05/20/2022 4:21 PM

## 2022-05-21 ENCOUNTER — Ambulatory Visit: Payer: Medicaid Other

## 2022-05-21 DIAGNOSIS — N12 Tubulo-interstitial nephritis, not specified as acute or chronic: Secondary | ICD-10-CM | POA: Diagnosis not present

## 2022-05-21 LAB — CBC
HCT: 32.4 % — ABNORMAL LOW (ref 36.0–46.0)
Hemoglobin: 9.5 g/dL — ABNORMAL LOW (ref 12.0–15.0)
MCH: 26.8 pg (ref 26.0–34.0)
MCHC: 29.3 g/dL — ABNORMAL LOW (ref 30.0–36.0)
MCV: 91.5 fL (ref 80.0–100.0)
Platelets: 154 10*3/uL (ref 150–400)
RBC: 3.54 MIL/uL — ABNORMAL LOW (ref 3.87–5.11)
RDW: 15 % (ref 11.5–15.5)
WBC: 5.9 10*3/uL (ref 4.0–10.5)
nRBC: 0 % (ref 0.0–0.2)

## 2022-05-21 LAB — BASIC METABOLIC PANEL
Anion gap: 11 (ref 5–15)
BUN: 9 mg/dL (ref 6–20)
CO2: 26 mmol/L (ref 22–32)
Calcium: 7.7 mg/dL — ABNORMAL LOW (ref 8.9–10.3)
Chloride: 103 mmol/L (ref 98–111)
Creatinine, Ser: 1.1 mg/dL — ABNORMAL HIGH (ref 0.44–1.00)
GFR, Estimated: 60 mL/min — ABNORMAL LOW (ref >60)
Glucose, Bld: 102 mg/dL — ABNORMAL HIGH (ref 70–99)
Potassium: 2.4 mmol/L — CL (ref 3.5–5.1)
Sodium: 140 mmol/L (ref 135–145)

## 2022-05-21 LAB — C-REACTIVE PROTEIN: CRP: 20.8 mg/dL — ABNORMAL HIGH (ref <1.0)

## 2022-05-21 LAB — MAGNESIUM: Magnesium: 1.8 mg/dL (ref 1.7–2.4)

## 2022-05-21 LAB — C DIFFICILE QUICK SCREEN W PCR REFLEX
C Diff antigen: NEGATIVE
C Diff interpretation: NOT DETECTED
C Diff toxin: NEGATIVE

## 2022-05-21 LAB — MRSA NEXT GEN BY PCR, NASAL: MRSA by PCR Next Gen: NOT DETECTED

## 2022-05-21 MED ORDER — MAGNESIUM OXIDE 400 MG PO TABS
400.0000 mg | ORAL_TABLET | Freq: Every day | ORAL | Status: DC
  Filled 2022-05-21: qty 1

## 2022-05-21 MED ORDER — POTASSIUM CHLORIDE CRYS ER 20 MEQ PO TBCR: 40.0000 meq | EXTENDED_RELEASE_TABLET | Freq: Every day | ORAL | Status: DC

## 2022-05-21 MED ORDER — MAGNESIUM SULFATE 2 GM/50ML IV SOLN
2.0000 g | Freq: Once | INTRAVENOUS | Status: AC
  Administered 2022-05-21: 2 g via INTRAVENOUS
  Filled 2022-05-21: qty 50

## 2022-05-21 MED ORDER — LACTATED RINGERS IV BOLUS
1000.0000 mL | Freq: Once | INTRAVENOUS | Status: AC
  Administered 2022-05-21: 1000 mL via INTRAVENOUS

## 2022-05-21 MED ORDER — ALBUTEROL SULFATE (2.5 MG/3ML) 0.083% IN NEBU: 3.0000 mL | INHALATION_SOLUTION | Freq: Four times a day (QID) | RESPIRATORY_TRACT | Status: DC | PRN

## 2022-05-21 MED ORDER — ONDANSETRON HCL 4 MG/2ML IJ SOLN
4.0000 mg | Freq: Four times a day (QID) | INTRAMUSCULAR | Status: DC | PRN
Start: 2022-05-21 — End: 2022-05-25
  Administered 2022-05-23: 4 mg via INTRAVENOUS
  Filled 2022-05-21: qty 2

## 2022-05-21 MED ORDER — POTASSIUM CHLORIDE IN NACL 40-0.9 MEQ/L-% IV SOLN
INTRAVENOUS | Status: DC
Start: 2022-05-21 — End: 2022-05-23
  Filled 2022-05-21 (×4): qty 1000

## 2022-05-21 MED ORDER — POTASSIUM CHLORIDE 10 MEQ/100ML IV SOLN
10.0000 meq | INTRAVENOUS | Status: DC
  Filled 2022-05-21 (×4): qty 100

## 2022-05-21 MED ORDER — POTASSIUM CHLORIDE CRYS ER 20 MEQ PO TBCR
40.0000 meq | EXTENDED_RELEASE_TABLET | Freq: Two times a day (BID) | ORAL | Status: DC
  Administered 2022-05-21: 40 meq via ORAL
  Filled 2022-05-21: qty 2

## 2022-05-21 MED ORDER — MAGNESIUM OXIDE 400 MG PO TABS
400.0000 mg | ORAL_TABLET | Freq: Every day | ORAL | Status: DC
  Administered 2022-05-22 – 2022-05-24 (×3): 400 mg via ORAL
  Filled 2022-05-21 (×5): qty 1

## 2022-05-21 NOTE — Consult Note (Addendum)
PHARMACY CONSULT NOTE - FOLLOW UP  Pharmacy Consult for Electrolyte Monitoring and Replacement   Recent Labs: Potassium (mmol/L)  Date Value  05/21/2022 2.4 (LL)   Magnesium (mg/dL)  Date Value  78/29/5621 1.8   Calcium (mg/dL)  Date Value  30/86/5784 7.7 (L)   Albumin (g/dL)  Date Value  69/62/9528 2.8 (L)   Phosphorus (mg/dL)  Date Value  41/32/4401 4.7 (H)   Sodium (mmol/L)  Date Value  05/21/2022 140  01/03/2017 144     Assessment: Patient admitted with uncontrolled vomiting. Pharmacy consulted for electrolytes management.  Noted Kcl 40 mEq po x 2 doses received on 5/22. K remains below goal at 2.4 (2.3 on admission).  Goal of Therapy:  K =/> 4 and all other electrolytes within normal limits  Plan:  Replace Kcl with po this AM (already given) plus Kcl  IVPB (total 80 meq today). Repeat K 2 hours after IVPB completed Will decrease scheduled Kcl to once daily until K remains within normal limits for 24 hours.  Jadeyn Hargett Rodriguez-Guzman PharmD, BCPS 05/21/2022 10:03 AM   5/23 @ 1030 Patient unable to tolerate KCL IVPB Will add NS with KCL @ 181ml/hr per Dr Allena Katz Resume Kcl po q 48hrs Repeat K and Mg level with AM labs Order Mg Sulfate 2gm x 1 dose today  Akaash Vandewater Rodriguez-Guzman PharmD, BCPS 05/21/2022 10:32 AM
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
## 2022-05-21 NOTE — Progress Notes (Signed)
Both IVs infiltrated.  Patient reports Left forearm is painful to the touch and the right upper arm is swollen.
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
## 2022-05-21 NOTE — Plan of Care (Signed)
  Problem: Clinical Measurements: Goal: Ability to maintain clinical measurements within normal limits will improve Outcome: Progressing   Problem: Clinical Measurements: Goal: Diagnostic test results will improve Outcome: Progressing   Problem: Clinical Measurements: Goal: Respiratory complications will improve Outcome: Progressing   

## 2022-05-21 NOTE — Progress Notes (Signed)
OT Cancellation Note  Patient Details Name: Sara [name] MRN: [id number] DOB: March 03, 1966   Cancelled Treatment:    Reason Eval/Treat Not Completed: Medical issues which prohibited therapy. Consult received, chart reviewed. Despite IV and PO potassium her lab values have remained steadily low (K+ 2.4), nursing reports that she has had a lot of diarrhea. Per PT who spoke with MD, MD OK with holding therapy today. Will re-attempt OT evaluation at later date/time as medically appropriate.     Ardeth Perfect., MPH, MS, OTR/L ascom [phone] 05/21/22, 10:02 AM

## 2022-05-21 NOTE — Progress Notes (Signed)
       CROSS COVER NOTE  NAME: AILIA ISLEY MRN: [id number] DOB : 11/21/1966  Secure chat received from nursing "mews fired yellow for low b/p 76/42. Asymptomatic" Manual BP is 80/32. 1L LR bolus ordered.   Neomia Glass DNP, MHA, FNP-BC Nurse Practitioner [facility] Pager [phone]

## 2022-05-22 ENCOUNTER — Ambulatory Visit: Payer: Medicaid Other

## 2022-05-22 DIAGNOSIS — N12 Tubulo-interstitial nephritis, not specified as acute or chronic: Secondary | ICD-10-CM | POA: Diagnosis not present

## 2022-05-22 LAB — URINE CULTURE: Culture: <10000 — AB

## 2022-05-22 LAB — MAGNESIUM: Magnesium: 2.3 mg/dL (ref 1.7–2.4)

## 2022-05-22 LAB — POTASSIUM: Potassium: 3.5 mmol/L (ref 3.5–5.1)

## 2022-05-22 LAB — C-REACTIVE PROTEIN: CRP: 11.6 mg/dL — ABNORMAL HIGH (ref <1.0)

## 2022-05-22 MED ORDER — LOPERAMIDE HCL 2 MG PO CAPS
2.0000 mg | ORAL_CAPSULE | ORAL | Status: DC | PRN
  Administered 2022-05-22 – 2022-05-23 (×3): 2 mg via ORAL
  Filled 2022-05-22 (×3): qty 1

## 2022-05-22 MED ORDER — POTASSIUM CHLORIDE CRYS ER 20 MEQ PO TBCR: 40.0000 meq | EXTENDED_RELEASE_TABLET | ORAL | Status: DC

## 2022-05-22 MED ORDER — POTASSIUM CHLORIDE CRYS ER 20 MEQ PO TBCR
40.0000 meq | EXTENDED_RELEASE_TABLET | ORAL | Status: AC
  Administered 2022-05-22 (×2): 40 meq via ORAL
  Filled 2022-05-22 (×2): qty 2
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
## 2022-05-22 NOTE — Consult Note (Signed)
PHARMACY CONSULT NOTE - FOLLOW UP  Pharmacy Consult for Electrolyte Monitoring and Replacement   Recent Labs: Potassium (mmol/L)  Date Value  05/22/2022 3.5   Magnesium (mg/dL)  Date Value  54/00/8676 2.3   Calcium (mg/dL)  Date Value  19/50/9326 7.7 (L)   Albumin (g/dL)  Date Value  71/24/5809 2.8 (L)   Phosphorus (mg/dL)  Date Value  98/33/8250 4.7 (H)   Sodium (mmol/L)  Date Value  05/21/2022 140  01/03/2017 144     Assessment: Patient admitted with uncontrolled vomiting. Pharmacy consulted for electrolytes management.  Goal of Therapy:  K =/> 4 and all other electrolytes within normal limits  Plan:  MD ordered Kcl 40 mEq x 2 doses and on NS with 40 mEq @ 50 ml/hr.  Mg oxide 400 mg daily.   Paschal Dopp PharmD, BCPS 05/22/2022 8:16 AM
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
## 2022-05-23 DIAGNOSIS — E876 Hypokalemia: Secondary | ICD-10-CM

## 2022-05-23 DIAGNOSIS — N151 Renal and perinephric abscess: Secondary | ICD-10-CM

## 2022-05-23 DIAGNOSIS — R5381 Other malaise: Secondary | ICD-10-CM | POA: Diagnosis not present

## 2022-05-23 DIAGNOSIS — S31109A Unspecified open wound of abdominal wall, unspecified quadrant without penetration into peritoneal cavity, initial encounter: Secondary | ICD-10-CM | POA: Diagnosis not present

## 2022-05-23 DIAGNOSIS — N12 Tubulo-interstitial nephritis, not specified as acute or chronic: Secondary | ICD-10-CM | POA: Diagnosis not present

## 2022-05-23 LAB — BASIC METABOLIC PANEL
Anion gap: 5 (ref 5–15)
BUN: <5 mg/dL — ABNORMAL LOW (ref 6–20)
CO2: 21 mmol/L — ABNORMAL LOW (ref 22–32)
Calcium: 8.1 mg/dL — ABNORMAL LOW (ref 8.9–10.3)
Chloride: 113 mmol/L — ABNORMAL HIGH (ref 98–111)
Creatinine, Ser: 0.86 mg/dL (ref 0.44–1.00)
GFR, Estimated: >60 mL/min (ref >60)
Glucose, Bld: 101 mg/dL — ABNORMAL HIGH (ref 70–99)
Potassium: 3.7 mmol/L (ref 3.5–5.1)
Sodium: 139 mmol/L (ref 135–145)

## 2022-05-23 LAB — CBC
HCT: 36.4 % (ref 36.0–46.0)
Hemoglobin: 10.8 g/dL — ABNORMAL LOW (ref 12.0–15.0)
MCH: 27.2 pg (ref 26.0–34.0)
MCHC: 29.7 g/dL — ABNORMAL LOW (ref 30.0–36.0)
MCV: 91.7 fL (ref 80.0–100.0)
Platelets: 258 10*3/uL (ref 150–400)
RBC: 3.97 MIL/uL (ref 3.87–5.11)
RDW: 15.3 % (ref 11.5–15.5)
WBC: 6.8 10*3/uL (ref 4.0–10.5)
nRBC: 0 % (ref 0.0–0.2)

## 2022-05-23 LAB — MAGNESIUM: Magnesium: 1.8 mg/dL (ref 1.7–2.4)

## 2022-05-23 LAB — C-REACTIVE PROTEIN: CRP: 5.4 mg/dL — ABNORMAL HIGH (ref <1.0)

## 2022-05-23 MED ORDER — ONDANSETRON 4 MG PO TBDP
4.0000 mg | ORAL_TABLET | Freq: Four times a day (QID) | ORAL | Status: DC | PRN
Start: 2022-05-23 — End: 2022-05-25
  Administered 2022-05-23 – 2022-05-24 (×2): 4 mg via ORAL
  Filled 2022-05-23 (×4): qty 1

## 2022-05-23 MED ORDER — MAGNESIUM SULFATE 2 GM/50ML IV SOLN
2.0000 g | Freq: Once | INTRAVENOUS | Status: AC
  Administered 2022-05-23: 2 g via INTRAVENOUS
  Filled 2022-05-23: qty 50

## 2022-05-23 MED ORDER — LEVOFLOXACIN 500 MG PO TABS
750.0000 mg | ORAL_TABLET | Freq: Every day | ORAL | Status: DC
Start: 2022-05-23 — End: 2022-05-25
  Administered 2022-05-23 – 2022-05-24 (×2): 750 mg via ORAL
  Filled 2022-05-23 (×2): qty 2

## 2022-05-23 MED ORDER — POTASSIUM CHLORIDE CRYS ER 20 MEQ PO TBCR
40.0000 meq | EXTENDED_RELEASE_TABLET | Freq: Once | ORAL | Status: AC
  Administered 2022-05-23: 40 meq via ORAL
  Filled 2022-05-23: qty 2

## 2022-05-23 NOTE — Progress Notes (Signed)
PT Cancellation Note  Patient Details Name: Sara [name] MRN: [id number] DOB: [birth year]   Cancelled Treatment:    Reason Eval/Treat Not Completed: Medical issues which prohibited therapy On arrival to room pt with emesis bad to her face and when asked how she was doing replies "Not so great."  She pleasantly declined working with PT but stated that she couldn't do anything today.  Malachi Pro, DPT 05/23/2022, 3:22 PM

## 2022-05-23 NOTE — Consult Note (Signed)
PHARMACY CONSULT NOTE - FOLLOW UP  Pharmacy Consult for Electrolyte Monitoring and Replacement   Recent Labs: Potassium (mmol/L)  Date Value  05/23/2022 3.7   Magnesium (mg/dL)  Date Value  55/37/4827 1.8   Calcium (mg/dL)  Date Value  07/86/7544 8.1 (L)   Albumin (g/dL)  Date Value  92/12/69 2.8 (L)   Phosphorus (mg/dL)  Date Value  21/97/5883 4.7 (H)   Sodium (mmol/L)  Date Value  05/23/2022 139  01/03/2017 144     Assessment: Patient admitted with uncontrolled vomiting. Pharmacy consulted for electrolytes management.  Goal of Therapy:  K =/> 4 and all other electrolytes within normal limits  Plan:  Continue NS with 40 mEq @ 50 ml/hr. Will give Kcl 40 mEq x 1 Mg 2 g IV x 1.  Mg oxide 400 mg daily.   Paschal Dopp PharmD, BCPS 05/23/2022 8:25 AM

## 2022-05-23 NOTE — Progress Notes (Signed)
Pt with No IV access, MD aware. MD had discussion at bedside with patient that we are waiting for ID MD to determine if patient can be switched to oral abx. If not, pt understands another IV will need to be attempted.

## 2022-05-23 NOTE — Progress Notes (Signed)
IVT assessed current PIV.  Pt states painful.  IVT offered to assess for new access, pt refused.  RN, Ginger made aware.
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
## 2022-05-23 NOTE — Progress Notes (Signed)
OT Cancellation Note  Patient Details Name: Sara [name] MRN: [id number] DOB: Apr 27, 1966   Cancelled Treatment:    Reason Eval/Treat Not Completed: Medical issues which prohibited therapy. Upon attempt, pt reporting recently finishing breakfast and feeling nauseated. RN provided zofran. Pt declines OT tx at this time, agreeable to re-attempt at later date/time.   Arman Filter., MPH, MS, OTR/L ascom [phone] 05/23/22, 9:37 AM

## 2022-05-23 NOTE — Plan of Care (Signed)
  Problem: Education: Goal: Knowledge of General Education information will improve Description Including pain rating scale, medication(s)/side effects and non-pharmacologic comfort measures Outcome: Progressing   Problem: Clinical Measurements: Goal: Ability to maintain clinical measurements within normal limits will improve Outcome: Progressing Goal: Will remain free from infection Outcome: Progressing Goal: Diagnostic test results will improve Outcome: Progressing Goal: Respiratory complications will improve Outcome: Progressing Goal: Cardiovascular complication will be avoided Outcome: Progressing   Problem: Activity: Goal: Risk for activity intolerance will decrease Outcome: Progressing   Problem: Coping: Goal: Level of anxiety will decrease Outcome: Progressing   Problem: Safety: Goal: Ability to remain free from injury will improve Outcome: Progressing   Problem: Skin Integrity: Goal: Risk for impaired skin integrity will decrease Outcome: Progressing   
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
## 2022-05-24 ENCOUNTER — Inpatient Hospital Stay: Payer: Medicaid Other

## 2022-05-24 ENCOUNTER — Ambulatory Visit: Payer: Medicaid Other | Admitting: Physician Assistant

## 2022-05-24 DIAGNOSIS — N10 Acute tubulo-interstitial nephritis: Secondary | ICD-10-CM | POA: Diagnosis not present

## 2022-05-24 DIAGNOSIS — N12 Tubulo-interstitial nephritis, not specified as acute or chronic: Secondary | ICD-10-CM | POA: Diagnosis not present

## 2022-05-24 LAB — CBC
HCT: 35.8 % — ABNORMAL LOW (ref 36.0–46.0)
Hemoglobin: 10.9 g/dL — ABNORMAL LOW (ref 12.0–15.0)
MCH: 27.7 pg (ref 26.0–34.0)
MCHC: 30.4 g/dL (ref 30.0–36.0)
MCV: 90.9 fL (ref 80.0–100.0)
Platelets: 305 10*3/uL (ref 150–400)
RBC: 3.94 MIL/uL (ref 3.87–5.11)
RDW: 15.4 % (ref 11.5–15.5)
WBC: 6.3 10*3/uL (ref 4.0–10.5)
nRBC: 0 % (ref 0.0–0.2)

## 2022-05-24 LAB — BASIC METABOLIC PANEL
Anion gap: 5 (ref 5–15)
BUN: <5 mg/dL — ABNORMAL LOW (ref 6–20)
CO2: 23 mmol/L (ref 22–32)
Calcium: 8.5 mg/dL — ABNORMAL LOW (ref 8.9–10.3)
Chloride: 110 mmol/L (ref 98–111)
Creatinine, Ser: 0.72 mg/dL (ref 0.44–1.00)
GFR, Estimated: >60 mL/min (ref >60)
Glucose, Bld: 80 mg/dL (ref 70–99)
Potassium: 3.3 mmol/L — ABNORMAL LOW (ref 3.5–5.1)
Sodium: 138 mmol/L (ref 135–145)

## 2022-05-24 LAB — MAGNESIUM: Magnesium: 1.9 mg/dL (ref 1.7–2.4)

## 2022-05-24 MED ORDER — POTASSIUM CHLORIDE CRYS ER 20 MEQ PO TBCR
40.0000 meq | EXTENDED_RELEASE_TABLET | Freq: Once | ORAL | Status: AC
  Administered 2022-05-24: 40 meq via ORAL
  Filled 2022-05-24: qty 2

## 2022-05-24 MED ORDER — LEVOFLOXACIN 750 MG PO TABS: 750.0000 mg | ORAL_TABLET | Freq: Every day | ORAL | 0 refills | Status: AC

## 2022-05-24 MED ORDER — NAPROXEN 375 MG PO TABS: 375.0000 mg | ORAL_TABLET | Freq: Every day | ORAL | Status: DC | PRN

## 2022-05-24 MED ORDER — LOPERAMIDE HCL 2 MG PO CAPS: 2.0000 mg | ORAL_CAPSULE | Freq: Two times a day (BID) | ORAL | 0 refills | Status: AC | PRN

## 2022-05-24 MED ORDER — MAGNESIUM OXIDE 400 (241.3 MG) MG PO TABS: 400.0000 mg | ORAL_TABLET | Freq: Every day | ORAL | Status: DC

## 2022-05-24 MED ORDER — SPIRONOLACTONE 25 MG PO TABS
ORAL_TABLET | ORAL | Status: DC
Start: 2022-05-24 — End: 2023-04-07

## 2022-05-24 MED ORDER — ONDANSETRON HCL 4 MG PO TABS: 4.0000 mg | ORAL_TABLET | Freq: Two times a day (BID) | ORAL | 5 refills | Status: DC | PRN

## 2022-05-24 MED ORDER — POTASSIUM CHLORIDE CRYS ER 20 MEQ PO TBCR: 40.0000 meq | EXTENDED_RELEASE_TABLET | Freq: Every day | ORAL | 0 refills | Status: DC

## 2022-05-24 MED ORDER — POTASSIUM CHLORIDE CRYS ER 20 MEQ PO TBCR: 40.0000 meq | EXTENDED_RELEASE_TABLET | Freq: Two times a day (BID) | ORAL | Status: DC
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
## 2022-05-24 NOTE — Progress Notes (Signed)
Patient discharged to home as per MD order, Patient education provided on prescribed medications and follow-up appointment with PCP. Patient verbalized understanding.

## 2022-05-24 NOTE — TOC Transition Note (Signed)
Transition of Care [facility]) - CM/SW Discharge Note   Patient Details  Name: KENLEI SAFI MRN: [id number] Date of Birth: 1966-02-14  [facility] CM/SW Contact:  Truddie Hidden, RN Phone Number: 05/24/2022, 4:19 PM   Clinical Narrative:     Transition of Care (TOC) Screening Note   Patient Details  Name: Kordelia Severin Fosse Date of Birth: 1966-12-04   Transition [facility]) CM/SW Contact:    Truddie Hidden, RN Phone Number: 05/24/2022, 4:19 PM    Transition of Care Department [facility]) has reviewed patient and no TOC needs have been identified at this time. We will continue to monitor patient advancement through interdisciplinary progression rounds. If new patient transition needs arise, please place a TOC consult.           Patient Goals and CMS Choice        Discharge Placement                       Discharge Plan and Services                                     Social Determinants of Health (SDOH) Interventions     Readmission Risk Interventions    04/02/2022    1:28 PM  Readmission Risk Prevention Plan  Post Dischage Appt Complete  Medication Screening Complete  Transportation Screening Complete

## 2022-05-24 NOTE — Consult Note (Signed)
PHARMACY CONSULT NOTE - FOLLOW UP  Pharmacy Consult for Electrolyte Monitoring and Replacement   Recent Labs: Potassium (mmol/L)  Date Value  05/24/2022 3.3 (L)   Magnesium (mg/dL)  Date Value  05/24/2022 1.9   Calcium (mg/dL)  Date Value  05/24/2022 8.5 (L)   Albumin (g/dL)  Date Value  05/20/2022 2.8 (L)   Phosphorus (mg/dL)  Date Value  10/31/2020 4.7 (H)   Sodium (mmol/L)  Date Value  05/24/2022 138  01/03/2017 144     Assessment: Patient admitted with uncontrolled vomiting. Pharmacy consulted for electrolytes management.  Goal of Therapy:  K =/> 4 and all other electrolytes within normal limits  Plan:  NS with 40 mEq @ 50 ml/hr stopped Will give Kcl 40 mEq x 2  Mg 2 g IV x 1.  Mg oxide 400 mg daily.   Eleonore Chiquito PharmD, BCPS 05/24/2022 7:30 AM
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
## 2022-05-24 NOTE — Progress Notes (Signed)
OT Cancellation Note  Patient Details Name: Sara [name] MRN: [id number] DOB: 09-17-1966   Cancelled Treatment:    Reason Eval/Treat Not Completed: Other (comment) (attempted to see pt for OT tx, pt sleeping soundly and requests therapist to attempt at a later time. OT will reattempt as able) Oleta Mouse, OTD OTR/L  05/24/22, 1:02 PM

## 2022-05-25 LAB — CULTURE, BLOOD (ROUTINE X 2)
Culture: NO GROWTH
Culture: NO GROWTH
Special Requests: ADEQUATE

## 2022-05-28 ENCOUNTER — Ambulatory Visit: Payer: Medicaid Other

## 2022-05-30 ENCOUNTER — Ambulatory Visit: Payer: Medicaid Other

## 2022-06-19 DIAGNOSIS — S72452N Displaced supracondylar fracture without intracondylar extension of lower end of left femur, subsequent encounter for open fracture type IIIA, IIIB, or IIIC with nonunion: Secondary | ICD-10-CM | POA: Diagnosis not present

## 2022-06-19 DIAGNOSIS — S92061D Displaced intraarticular fracture of right calcaneus, subsequent encounter for fracture with routine healing: Secondary | ICD-10-CM | POA: Diagnosis not present

## 2022-06-19 DIAGNOSIS — M24562 Contracture, left knee: Secondary | ICD-10-CM | POA: Diagnosis not present

## 2022-07-09 ENCOUNTER — Encounter: Payer: Medicaid Other | Attending: Physician Assistant | Admitting: Physician Assistant

## 2022-07-09 DIAGNOSIS — L98492 Non-pressure chronic ulcer of skin of other sites with fat layer exposed: Secondary | ICD-10-CM | POA: Diagnosis not present

## 2022-07-09 DIAGNOSIS — L98495 Non-pressure chronic ulcer of skin of other sites with muscle involvement without evidence of necrosis: Secondary | ICD-10-CM | POA: Insufficient documentation

## 2022-07-09 DIAGNOSIS — T8131XD Disruption of external operation (surgical) wound, not elsewhere classified, subsequent encounter: Secondary | ICD-10-CM | POA: Insufficient documentation

## 2022-07-09 DIAGNOSIS — T8131XA Disruption of external operation (surgical) wound, not elsewhere classified, initial encounter: Secondary | ICD-10-CM | POA: Diagnosis not present
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
## 2022-07-09 NOTE — Progress Notes (Signed)
Wonders, Jalynne A. ([id number]) Visit Report for 07/09/2022 Arrival Information Details Patient Name: Sara [name], [name] Date of Service: 07/09/2022 3:15 PM Medical Record Number: [id number] Patient Account Number: [id number] Date of Birth/Sex: 1966/08/01 (56 y.o. F) Treating RN: Angelina Pih Primary Care Ayven Glasco: Orson Eva Other Clinician: Referring Sanaai Doane: Orson Eva Treating Kiosha Buchan/Extender: Rowan Blase in Treatment: 12 Visit Information History Since Last Visit Added or deleted any medications: No Patient Arrived: Wheel Chair Any new allergies or adverse reactions: No Arrival Time: 15:06 Had a fall or experienced change in No Accompanied By: Marciel activities of daily living that may affect Transfer Assistance: None risk of falls: Patient Identification Verified: Yes Hospitalized since last visit: No Secondary Verification Process Completed: Yes Has Dressing in Place Sara [name] Prescribed: Yes Patient Requires Transmission-Based Precautions: No Pain Present Now: No Patient Has Alerts: Yes Patient Alerts: NOT diabetic Electronic Signature(s) Signed: 07/09/2022 4:07:49 PM By: Angelina Pih Entered By: Angelina Pih on 07/09/2022 15:07:13 Sara [name], [name] ([id number]) -------------------------------------------------------------------------------- Clinic Level of Care Assessment Details Patient Name: Sara [name], [name] Date of Service: 07/09/2022 3:15 PM Medical Record Number: [id number] Patient Account Number: [id number] Date of Birth/Sex: 06-29-1966 (56 y.o. F) Treating RN: Angelina Pih Primary Care Courtni Balash: Orson Eva Other Clinician: Referring Tricia Oaxaca: Orson Eva Treating Aedin Jeansonne/Extender: Rowan Blase in Treatment: 12 Clinic Level of Care Assessment Items TOOL 4 Quantity Score []  - Use when only an EandM is performed on FOLLOW-UP visit 0 ASSESSMENTS - Nursing Assessment / Reassessment X - Reassessment of Co-morbidities (includes  updates in patient status) 1 10 X- 1 5 Reassessment of Adherence to Treatment Plan ASSESSMENTS - Wound and Skin Assessment / Reassessment X - Simple Wound Assessment / Reassessment - one wound 1 5 []  - 0 Complex Wound Assessment / Reassessment - multiple wounds []  - 0 Dermatologic / Skin Assessment (not related to wound area) ASSESSMENTS - Focused Assessment []  - Circumferential Edema Measurements - multi extremities 0 []  - 0 Nutritional Assessment / Counseling / Intervention []  - 0 Lower Extremity Assessment (monofilament, tuning fork, pulses) []  - 0 Peripheral Arterial Disease Assessment (using hand held doppler) ASSESSMENTS - Ostomy and/or Continence Assessment and Care []  - Incontinence Assessment and Management 0 []  - 0 Ostomy Care Assessment and Management (repouching, etc.) PROCESS - Coordination of Care X - Simple Patient / Family Education for ongoing care 1 15 []  - 0 Complex (extensive) Patient / Family Education for ongoing care []  - 0 Staff obtains , Records, Test Results / Process Orders []  - 0 Staff telephones HHA, Nursing Homes / Clarify orders / etc []  - 0 Routine Transfer to another Facility (non-emergent condition) []  - 0 Routine Hospital Admission (non-emergent condition) []  - 0 New Admissions / / Ordering NPWT, Apligraf, etc. []  - 0 Emergency Hospital Admission (emergent condition) X- 1 10 Simple Discharge Coordination []  - 0 Complex (extensive) Discharge Coordination PROCESS - Special Needs []  - Pediatric / Minor Patient Management 0 []  - 0 Isolation Patient Management []  - 0 Hearing / Language / Visual special needs []  - 0 Assessment of Community assistance (transportation, D/C planning, etc.) []  - 0 Additional assistance / Altered mentation []  - 0 Support Surface(s) Assessment (bed, cushion, seat, etc.) INTERVENTIONS - Wound Cleansing / Measurement Sayavong, Meztli A. ( ) X- 1 5 Simple Wound  Cleansing - one wound []  - 0 Complex Wound Cleansing - multiple wounds X- 1 5 Wound Imaging (photographs - any number of wounds) []  - 0 Wound Tracing (instead of photographs) X- 1 5  Simple Wound Measurement - one wound []  - 0 Complex Wound Measurement - multiple wounds INTERVENTIONS - Wound Dressings X - Small Wound Dressing one or multiple wounds 1 10 []  - 0 Medium Wound Dressing one or multiple wounds []  - 0 Large Wound Dressing one or multiple wounds []  - 0 Application of Medications - topical []  - 0 Application of Medications - injection INTERVENTIONS - Miscellaneous []  - External ear exam 0 []  - 0 Specimen Collection (cultures, biopsies, blood, body fluids, etc.) []  - 0 Specimen(s) / Culture(s) sent or taken to Lab for analysis []  - 0 Patient Transfer (multiple staff / / Similar devices) []  - 0 Simple Staple / Suture removal (25 or less) []  - 0 Complex Staple / Suture removal (26 or more) []  - 0 Hypo / Hyperglycemic Management (close monitor of Blood Glucose) []  - 0 Ankle / Brachial Index (ABI) - do not check if billed separately X- 1 5 Vital Signs Has the patient been seen at the hospital within the last three years: Yes Total Score: 75 Level Of Care: New/Established - Level 2 Electronic Signature(s) Signed: 07/09/2022 4:07:49 PM By: Entered By: on 07/09/2022 15:31:29 Sara [name], [name] ( ) -------------------------------------------------------------------------------- Encounter Discharge Information Details Patient Name: Sara [name], [name] Date of Service: 07/09/2022 3:15 PM Medical Record Number: Patient Account Number: Date of Birth/Sex: 04/08/1966 (56 y.o. F) Treating RN: Primary Care Violeta Lecount: Other Clinician: Referring Kallyn Demarcus: Treating Damonica Chopra/Extender: 09/09/2022 in Treatment: 12 Encounter Discharge Information  Items Discharge Condition: Stable Ambulatory Status: Wheelchair Discharge Destination: Home Transportation: Private Auto Accompanied By: son Schedule Follow-up Appointment: Yes Clinical Summary of Care: Electronic Signature(s) Signed: 07/09/2022 4:07:49 PM By: 09/09/2022 Entered By: [id number] on 07/09/2022 15:32:16 Cabriales, Drake A. ([id number]) -------------------------------------------------------------------------------- Lower Extremity Assessment Details Patient Name: Sara [name], [name] Date of Service: 07/09/2022 3:15 PM Medical Record Number: 03/22/1966 Patient Account Number: [id number] Date of Birth/Sex: 12-28-1966 (56 y.o. F) Treating RN: Orson Eva Primary Care Jisella Ashenfelter: Rowan Blase Other Clinician: Referring Kaelani Kendrick: 13 Treating Hayato Guaman/Extender: 09/09/2022 Weeks in Treatment: 12 Electronic Signature(s) Signed: 07/09/2022 4:07:49 PM By: Angelina Pih Entered By: 09/09/2022 on 07/09/2022 15:14:04 Zechman, Tyhesha A. (09/09/2022) -------------------------------------------------------------------------------- Multi Wound Chart Details Patient Name: Sara [name], [name] Date of Service: 07/09/2022 3:15 PM Medical Record Number: [id number] Patient Account Number: 03/22/1966 Date of Birth/Sex: 1966/11/16 (56 y.o. F) Treating RN: Orson Eva Primary Care Jie Stickels: Orson Eva Other Clinician: Referring Bilal Manzer: Allen Derry Treating Doctor Sheahan/Extender: 09/09/2022 Weeks in Treatment: 12 Vital Signs Height(in): 65 Pulse(bpm): 72 Weight(lbs): 220 Blood Pressure(mmHg): 109/70 Body Mass Index(BMI): 36.6 Temperature(F): 98.3 Respiratory Rate(breaths/min): 18 Photos: [N/A:N/A] Wound Location: Abdomen - midline N/A N/A Wounding Event: Surgical Injury N/A N/A Primary Etiology: Open Surgical Wound N/A N/A Comorbid History: Asthma, Arrhythmia N/A N/A Date Acquired: 03/25/2022 N/A N/A Weeks of Treatment: 12 N/A N/A Wound Status:  Open N/A N/A Wound Recurrence: No N/A N/A Measurements L x W x D (cm) 1.5x1x0.1 N/A N/A Area (cm) : 1.178 N/A N/A Volume (cm) : 0.118 N/A N/A % Reduction in Area: 98.90% N/A N/A % Reduction in Volume: 99.90% N/A N/A Classification: Full Thickness With Exposed N/A N/A Support Structures Exudate Amount: Medium N/A N/A Exudate Type: Serosanguineous N/A N/A Exudate Color: red, brown N/A N/A Granulation Amount: Large (67-100%) N/A N/A Granulation Quality: Red, Hyper-granulation N/A N/A Necrotic Amount: Small (1-33%) N/A N/A Exposed Structures: Fat Layer (Subcutaneous Tissue): N/A N/A Yes Fascia: No  Tendon: No Muscle: No Joint: No Bone: No Epithelialization: Medium (34-66%) N/A N/A Treatment Notes Electronic Signature(s) Signed: 07/09/2022 3:22:23 PM By: Angelina Pih Entered By: Angelina Pih on 07/09/2022 15:22:23 Fussell, Aveleen A. ([id number]) -------------------------------------------------------------------------------- Multi-Disciplinary Care Plan Details Patient Name: Sara [name], [name] Date of Service: 07/09/2022 3:15 PM Medical Record Number: [id number] Patient Account Number: [id number] Date of Birth/Sex: 25-Nov-1966 (56 y.o. F) Treating RN: Angelina Pih Primary Care Fannye Myer: Orson Eva Other Clinician: Referring Elder Davidian: Orson Eva Treating Jenasia Dolinar/Extender: Allen Derry Weeks in Treatment: 12 Active Inactive Electronic Signature(s) Signed: 07/09/2022 3:22:17 PM By: Angelina Pih Entered By: Angelina Pih on 07/09/2022 15:22:17 Sara [name], [name] ([id number]) -------------------------------------------------------------------------------- Pain Assessment Details Patient Name: Sara [name], [name] Date of Service: 07/09/2022 3:15 PM Medical Record Number: [id number] Patient Account Number: [id number] Date of Birth/Sex: July 25, 1966 (56 y.o. F) Treating RN: Angelina Pih Primary Care Kaytlyn Din: Orson Eva Other Clinician: Referring Sherrika Weakland:  Orson Eva Treating Joelyn Lover/Extender: Allen Derry Weeks in Treatment: 12 Active Problems Location of Pain Severity and Description of Pain Patient Has Paino No Site Locations Rate the pain. Current Pain Level: 0 Pain Management and Medication Current Pain Management: Electronic Signature(s) Signed: 07/09/2022 4:07:49 PM By: Angelina Pih Entered By: Angelina Pih on 07/09/2022 15:11:15 Sara [name], [name] ([id number]) -------------------------------------------------------------------------------- Patient/Caregiver Education Details Patient Name: Guedea, Elli A. Date of Service: 07/09/2022 3:15 PM Medical Record Number: [id number] Patient Account Number: [id number] Date of Birth/Gender: 1966-10-09 (56 y.o. F) Treating RN: Angelina Pih Primary Care Physician: Orson Eva Other Clinician: Referring Physician: Orson Eva Treating Physician/Extender: Rowan Blase in Treatment: 12 Education Assessment Education Provided To: Patient Education Topics Provided Wound/Skin Impairment: Handouts: Caring for Your Ulcer Methods: Explain/Verbal Responses: State content correctly Electronic Signature(s) Signed: 07/09/2022 4:07:49 PM By: Angelina Pih Entered By: Angelina Pih on 07/09/2022 15:31:44 Asher, Harlow A. ([id number]) -------------------------------------------------------------------------------- Wound Assessment Details Patient Name: Rody, Marriah A. Date of Service: 07/09/2022 3:15 PM Medical Record Number: [id number] Patient Account Number: [id number] Date of Birth/Sex: Jan 05, 1966 (56 y.o. F) Treating RN: Angelina Pih Primary Care Aneisha Skyles: Orson Eva Other Clinician: Referring Aulden Calise: Orson Eva Treating Evon Lopezperez/Extender: Allen Derry Weeks in Treatment: 12 Wound Status Wound Number: 1 Primary Etiology: Open Surgical Wound Wound Location: Abdomen - midline Wound Status: Open Wounding Event: Surgical Injury Comorbid History:  Asthma, Arrhythmia Date Acquired: 03/25/2022 Weeks Of Treatment: 12 Clustered Wound: No Photos Wound Measurements Length: (cm) 1.5 Width: (cm) 1 Depth: (cm) 0.1 Area: (cm) 1.178 Volume: (cm) 0.118 % Reduction in Area: 98.9% % Reduction in Volume: 99.9% Epithelialization: Medium (34-66%) Tunneling: No Undermining: No Wound Description Classification: Full Thickness With Exposed Support Structures Exudate Amount: Medium Exudate Type: Serosanguineous Exudate Color: red, brown Foul Odor After Cleansing: No Slough/Fibrino Yes Wound Bed Granulation Amount: Large (67-100%) Exposed Structure Granulation Quality: Red, Hyper-granulation Fascia Exposed: No Necrotic Amount: Small (1-33%) Fat Layer (Subcutaneous Tissue) Exposed: Yes Necrotic Quality: Adherent Slough Tendon Exposed: No Muscle Exposed: No Joint Exposed: No Bone Exposed: No Treatment Notes Wound #1 (Abdomen - midline) Cleanser Wound Cleanser Discharge Instruction: Wash your hands with soap and water. Remove old dressing, discard into plastic bag and place into trash. Cleanse the wound with Wound Cleanser prior to applying a clean dressing using gauze sponges, not tissues or cotton balls. Do not scrub or use excessive force. Pat dry using gauze sponges, not tissue or cotton balls. Bellew, Felix A. ([id number]) Peri-Wound Care Topical Primary Dressing Silvercel 4 1/4x 4 1/4 (in/in) Discharge Instruction: Apply Silvercel 4 1/4x 4 1/4 (in/in) Sara [name] instructed Secondary Dressing ABD Pad 5x9 (  in/in) Discharge Instruction: Cover with ABD pad Secured With Medipore Tape - 65M Medipore H Soft Cloth Surgical Tape, 2x2 (in/yd) Compression Wrap Compression Stockings Add-Ons Electronic Signature(s) Signed: 07/09/2022 4:07:49 PM By: Angelina Pih Entered By: Angelina Pih on 07/09/2022 15:13:54 Hemmelgarn, Tawanda A. ([id number]) -------------------------------------------------------------------------------- Vitals  Details Patient Name: Senseney, Jerica A. Date of Service: 07/09/2022 3:15 PM Medical Record Number: [id number] Patient Account Number: [id number] Date of Birth/Sex: [birth year] (56 y.o. F) Treating RN: Angelina Pih Primary Care Kindrick Lankford: Orson Eva Other Clinician: Referring June Vacha: Orson Eva Treating Clarion Mooneyhan/Extender: Allen Derry Weeks in Treatment: 12 Vital Signs Time Taken: 15:07 Temperature (F): 98.3 Height (in): 65 Pulse (bpm): 72 Weight (lbs): 220 Respiratory Rate (breaths/min): 18 Body Mass Index (BMI): 36.6 Blood Pressure (mmHg): 109/70 Reference Range: 80 - 120 mg / dl Electronic Signature(s) Signed: 07/09/2022 4:07:49 PM By: Angelina Pih Entered By: Angelina Pih on 07/09/2022 15:11:09

## 2022-07-23 ENCOUNTER — Encounter: Payer: Medicaid Other | Admitting: Internal Medicine

## 2022-07-23 DIAGNOSIS — T8131XD Disruption of external operation (surgical) wound, not elsewhere classified, subsequent encounter: Secondary | ICD-10-CM | POA: Diagnosis not present

## 2022-07-23 DIAGNOSIS — L98495 Non-pressure chronic ulcer of skin of other sites with muscle involvement without evidence of necrosis: Secondary | ICD-10-CM | POA: Diagnosis not present

## 2022-07-23 DIAGNOSIS — T8131XA Disruption of external operation (surgical) wound, not elsewhere classified, initial encounter: Secondary | ICD-10-CM | POA: Diagnosis not present

## 2022-07-23 DIAGNOSIS — L98492 Non-pressure chronic ulcer of skin of other sites with fat layer exposed: Secondary | ICD-10-CM | POA: Diagnosis not present

## 2022-07-24 NOTE — Progress Notes (Signed)
Foor, [name] ([id number]) Visit Report for 07/23/2022 Arrival Information Details Patient Name: Sara [name], [name] Date of Service: 07/23/2022 3:00 PM Medical Record Number: [id number] Patient Account Number: [id number] Date of Birth/Sex: Jul 25, 1966 (56 y.o. F) Treating RN: Huel Coventry Primary Care Taia Bramlett: Orson Eva Other Clinician: Betha Loa Referring Shyler Hamill: Orson Eva Treating Anelise Staron/Extender: Altamese [place] in Treatment: 14 Visit Information History Since Last Visit All ordered tests and consults were completed: No Patient Arrived: Wheel Chair Added or deleted any medications: No Arrival Time: 15:47 Any new allergies or adverse reactions: No Transfer Assistance: EasyPivot Patient Lift Had a fall or experienced change in No activities of daily living that may affect Patient Requires Transmission-Based No risk of falls: Precautions: Hospitalized since last visit: No Patient Has Alerts: Yes Pain Present Now: No Patient Alerts: NOT diabetic Electronic Signature(s) Signed: 07/24/2022 8:24:32 AM By: Betha Loa Entered By: Betha Loa on 07/23/2022 15:48:26 Reggio, Litisha A. ([id number]) -------------------------------------------------------------------------------- Clinic Level of Care Assessment Details Patient Name: [name], [name] Date of Service: 07/23/2022 3:00 PM Medical Record Number: [id number] Patient Account Number: [id number] Date of Birth/Sex: [birth year] (56 y.o. F) Treating RN: Huel Coventry Primary Care Ronny Korff: Orson Eva Other Clinician: Betha Loa Referring Eniyah Eastmond: Orson Eva Treating Carlon Davidson/Extender: Altamese [place] in Treatment: 14 Clinic Level of Care Assessment Items TOOL 4 Quantity Score []  - Use when only an EandM is performed on FOLLOW-UP visit 0 ASSESSMENTS - Nursing Assessment / Reassessment X - Reassessment of Co-morbidities (includes updates in patient status) 1 10 X- 1  5 Reassessment of Adherence to Treatment Plan ASSESSMENTS - Wound and Skin Assessment / Reassessment X - Simple Wound Assessment / Reassessment - one wound 1 5 []  - 0 Complex Wound Assessment / Reassessment - multiple wounds []  - 0 Dermatologic / Skin Assessment (not related to wound area) ASSESSMENTS - Focused Assessment []  - Circumferential Edema Measurements - multi extremities 0 []  - 0 Nutritional Assessment / Counseling / Intervention []  - 0 Lower Extremity Assessment (monofilament, tuning fork, pulses) []  - 0 Peripheral Arterial Disease Assessment (using hand held doppler) ASSESSMENTS - Ostomy and/or Continence Assessment and Care []  - Incontinence Assessment and Management 0 []  - 0 Ostomy Care Assessment and Management (repouching, etc.) PROCESS - Coordination of Care X - Simple Patient / Family Education for ongoing care 1 15 []  - 0 Complex (extensive) Patient / Family Education for ongoing care []  - 0 Staff obtains , Records, Test Results / Process Orders []  - 0 Staff telephones HHA, Nursing Homes / Clarify orders / etc []  - 0 Routine Transfer to another Facility (non-emergent condition) []  - 0 Routine Hospital Admission (non-emergent condition) []  - 0 New Admissions / / Ordering NPWT, Apligraf, etc. []  - 0 Emergency Hospital Admission (emergent condition) X- 1 10 Simple Discharge Coordination []  - 0 Complex (extensive) Discharge Coordination PROCESS - Special Needs []  - Pediatric / Minor Patient Management 0 []  - 0 Isolation Patient Management []  - 0 Hearing / Language / Visual special needs []  - 0 Assessment of Community assistance (transportation, D/C planning, etc.) []  - 0 Additional assistance / Altered mentation []  - 0 Support Surface(s) Assessment (bed, cushion, seat, etc.) INTERVENTIONS - Wound Cleansing / Measurement Heider, Assata A. ( ) X- 1 5 Simple Wound Cleansing - one wound []  - 0 Complex Wound  Cleansing - multiple wounds X- 1 5 Wound Imaging (photographs - any number of wounds) []  - 0 Wound Tracing (instead of photographs) X- 1 5 Simple Wound Measurement -  one wound []  - 0 Complex Wound Measurement - multiple wounds INTERVENTIONS - Wound Dressings []  - Small Wound Dressing one or multiple wounds 0 X- 1 15 Medium Wound Dressing one or multiple wounds []  - 0 Large Wound Dressing one or multiple wounds []  - 0 Application of Medications - topical []  - 0 Application of Medications - injection INTERVENTIONS - Miscellaneous []  - External ear exam 0 []  - 0 Specimen Collection (cultures, biopsies, blood, body fluids, etc.) []  - 0 Specimen(s) / Culture(s) sent or taken to Lab for analysis []  - 0 Patient Transfer (multiple staff / / Similar devices) []  - 0 Simple Staple / Suture removal (25 or less) []  - 0 Complex Staple / Suture removal (26 or more) []  - 0 Hypo / Hyperglycemic Management (close monitor of Blood Glucose) []  - 0 Ankle / Brachial Index (ABI) - do not check if billed separately X- 1 5 Vital Signs Has the patient been seen at the hospital within the last three years: Yes Total Score: 80 Level Of Care: New/Established - Level 3 Electronic Signature(s) Signed: 07/24/2022 8:24:32 AM By: Entered By: on 07/23/2022 16:13:49 Mcevoy, Eilidh A. ( ) -------------------------------------------------------------------------------- Encounter Discharge Information Details Patient Name: [name], [name] Date of Service: 07/23/2022 3:00 PM Medical Record Number: Patient Account Number: Date of Birth/Sex: Nov 19, 1966 (56 y.o. F) Treating RN: Primary Care Cillian Gwinner: Other Clinician: Referring Alquan Morrish: 07/26/2022 Treating Kohner Orlick/Extender: Betha Loa in Treatment: 14 Encounter Discharge Information Items Discharge Condition:  Stable Ambulatory Status: Wheelchair Discharge Destination: Home Transportation: Private Auto Accompanied By: Grounds Schedule Follow-up Appointment: Yes Clinical Summary of Care: Electronic Signature(s) Signed: 07/24/2022 8:24:32 AM By: 07/25/2022 Entered By: [id number] on 07/23/2022 16:15:23 Okubo, Laryn A. ([id number]) -------------------------------------------------------------------------------- Lower Extremity Assessment Details Patient Name: Schafer, Reaghan A. Date of Service: 07/23/2022 3:00 PM Medical Record Number: 03/22/1966 Patient Account Number: [id number] Date of Birth/Sex: [birth year] (56 y.o. F) Treating RN: Betha Loa Primary Care Dexton Zwilling: Orson Eva Other Clinician: Altamese [place] Referring Christien Frankl: 07/26/2022 Treating Danna Casella/Extender: Betha Loa in Treatment: 14 Electronic Signature(s) Signed: 07/24/2022 8:24:32 AM By: 07/25/2022 Signed: 07/24/2022 4:29:23 PM By: 07/25/2022 BSN, RN, CWS, Kim RN, BSN Entered By: [id number] on 07/23/2022 15:59:21 Marcom, Neli A. (03/22/1966) -------------------------------------------------------------------------------- Multi Wound Chart Details Patient Name: Kalina, Lela A. Date of Service: 07/23/2022 3:00 PM Medical Record Number: Huel Coventry Patient Account Number: Orson Eva Date of Birth/Sex: [birth year] (56 y.o. F) Treating RN: Altamese [place] Primary Care Issaih Kaus: 07/26/2022 Other Clinician: Betha Loa Referring Jahbari Repinski: 07/26/2022 Treating Lakisa Lotz/Extender: Elliot Gurney Weeks in Treatment: 14 Vital Signs Height(in): 65 Pulse(bpm): 57 Weight(lbs): 220 Blood Pressure(mmHg): 100/64 Body Mass Index(BMI): 36.6 Temperature(F): 98.5 Respiratory Rate(breaths/min): 16 Photos: [N/A:N/A] Wound Location: Abdomen - midline N/A N/A Wounding Event: Surgical Injury N/A N/A Primary Etiology: Open Surgical Wound N/A N/A Comorbid History: Asthma, Arrhythmia N/A N/A Date  Acquired: 03/25/2022 N/A N/A Weeks of Treatment: 14 N/A N/A Wound Status: Open N/A N/A Wound Recurrence: No N/A N/A Measurements L x W x D (cm) 1x0.5x0.1 N/A N/A Area (cm) : 0.393 N/A N/A Volume (cm) : 0.039 N/A N/A % Reduction in Area: 99.60% N/A N/A % Reduction in Volume: 100.00% N/A N/A Classification: Full Thickness With Exposed N/A N/A Support Structures Exudate Amount: Medium N/A N/A Exudate Type: Serosanguineous N/A N/A Exudate Color: red, brown N/A N/A Granulation Amount: Large (67-100%) N/A N/A Granulation Quality: Red, Hyper-granulation N/A N/A Necrotic  Amount: Small (1-33%) N/A N/A Exposed Structures: Fat Layer (Subcutaneous Tissue): N/A N/A Yes Fascia: No Tendon: No Muscle: No Joint: No Bone: No Epithelialization: Medium (34-66%) N/A N/A Treatment Notes Electronic Signature(s) Signed: 07/24/2022 8:24:32 AM By: Betha Loa Entered By: Betha Loa on 07/23/2022 15:59:36 Parslow, Walda A. ([id number]) -------------------------------------------------------------------------------- Multi-Disciplinary Care Plan Details Patient Name: Ahlgren, Marielouise A. Date of Service: 07/23/2022 3:00 PM Medical Record Number: [id number] Patient Account Number: [id number] Date of Birth/Sex: 03-14-1966 (56 y.o. F) Treating RN: Huel Coventry Primary Care Dustine Stickler: Orson Eva Other Clinician: Betha Loa Referring Victora Irby: Orson Eva Treating Danyele Smejkal/Extender: Altamese [place] in Treatment: 14 Active Inactive Electronic Signature(s) Signed: 07/24/2022 8:24:32 AM By: Betha Loa Signed: 07/24/2022 4:29:23 PM By: Elliot Gurney BSN, RN, CWS, Kim RN, BSN Entered By: Betha Loa on 07/23/2022 15:59:26 Kauer, Poppi A. ([id number]) -------------------------------------------------------------------------------- Pain Assessment Details Patient Name: Gehret, Wyvonne A. Date of Service: 07/23/2022 3:00 PM Medical Record Number: [id number] Patient Account Number:  [id number] Date of Birth/Sex: 02-Jul-1966 (56 y.o. F) Treating RN: Huel Coventry Primary Care Noreene Boreman: Orson Eva Other Clinician: Betha Loa Referring Drakkar Medeiros: Orson Eva Treating Nidhi Jacome/Extender: Altamese [place] in Treatment: 14 Active Problems Location of Pain Severity and Description of Pain Patient Has Paino No Site Locations Pain Management and Medication Current Pain Management: Electronic Signature(s) Signed: 07/24/2022 8:24:32 AM By: Betha Loa Signed: 07/24/2022 4:29:23 PM By: Elliot Gurney, BSN, RN, CWS, Kim RN, BSN Entered By: Betha Loa on 07/23/2022 15:51:51 Bonneau, Jaielle AMarland Kitchen ([id number]) -------------------------------------------------------------------------------- Patient/Caregiver Education Details Patient Name: Havens, Jeronica A. Date of Service: 07/23/2022 3:00 PM Medical Record Number: [id number] Patient Account Number: [id number] Date of Birth/Gender: 10/23/1966 (56 y.o. F) Treating RN: Huel Coventry Primary Care Physician: Orson Eva Other Clinician: Betha Loa Referring Physician: Orson Eva Treating Physician/Extender: Altamese [place] in Treatment: 14 Education Assessment Education Provided To: Patient Education Topics Provided Wound/Skin Impairment: Handouts: Other: continue wound care as directed Electronic Signature(s) Signed: 07/24/2022 8:24:32 AM By: Betha Loa Entered By: Betha Loa on 07/23/2022 16:14:32 Kimbrell, Paisly A. ([id number]) -------------------------------------------------------------------------------- Wound Assessment Details Patient Name: Nickell, Yolani A. Date of Service: 07/23/2022 3:00 PM Medical Record Number: [id number] Patient Account Number: [id number] Date of Birth/Sex: 03-Dec-1966 (56 y.o. F) Treating RN: Huel Coventry Primary Care Casidee Jann: Orson Eva Other Clinician: Betha Loa Referring Nasrin Lanzo: Orson Eva Treating Minoru Chap/Extender: Maxwell Caul Weeks in  Treatment: 14 Wound Status Wound Number: 1 Primary Etiology: Open Surgical Wound Wound Location: Abdomen - midline Wound Status: Open Wounding Event: Surgical Injury Comorbid History: Asthma, Arrhythmia Date Acquired: 03/25/2022 Weeks Of Treatment: 14 Clustered Wound: No Photos Wound Measurements Length: (cm) 1 Width: (cm) 0.5 Depth: (cm) 0.1 Area: (cm) 0.393 Volume: (cm) 0.039 % Reduction in Area: 99.6% % Reduction in Volume: 100% Epithelialization: Medium (34-66%) Wound Description Classification: Full Thickness With Exposed Support Structures Exudate Amount: Medium Exudate Type: Serosanguineous Exudate Color: red, brown Foul Odor After Cleansing: No Slough/Fibrino Yes Wound Bed Granulation Amount: Large (67-100%) Exposed Structure Granulation Quality: Red, Hyper-granulation Fascia Exposed: No Necrotic Amount: Small (1-33%) Fat Layer (Subcutaneous Tissue) Exposed: Yes Necrotic Quality: Adherent Slough Tendon Exposed: No Muscle Exposed: No Joint Exposed: No Bone Exposed: No Treatment Notes Wound #1 (Abdomen - midline) Cleanser Wound Cleanser Discharge Instruction: Wash your hands with soap and water. Remove old dressing, discard into plastic bag and place into trash. Cleanse the wound with Wound Cleanser prior to applying a clean dressing using gauze sponges, not tissues or cotton balls. Do not scrub or use excessive force. Pat dry using gauze  sponges, not tissue or cotton balls. Leist, Michille A. ([id number]) Peri-Wound Care Topical Primary Dressing Silvercel 4 1/4x 4 1/4 (in/in) Discharge Instruction: Apply Silvercel 4 1/4x 4 1/4 (in/in) as instructed Secondary Dressing ABD Pad 5x9 (in/in) Discharge Instruction: Cover with ABD pad Secured With Medipore Tape - 71M Medipore H Soft Cloth Surgical Tape, 2x2 (in/yd) Compression Wrap Compression Stockings Add-Ons Electronic Signature(s) Signed: 07/24/2022 8:24:32 AM By: Betha Loa Signed: 07/24/2022 4:29:23  PM By: Elliot Gurney, BSN, RN, CWS, Kim RN, BSN Entered By: Betha Loa on 07/23/2022 15:58:49 Secrist, Caresse A. ([id number]) -------------------------------------------------------------------------------- Vitals Details Patient Name: Miera, Ruwayda A. Date of Service: 07/23/2022 3:00 PM Medical Record Number: [id number] Patient Account Number: [id number] Date of Birth/Sex: Oct 30, 1966 (56 y.o. F) Treating RN: Huel Coventry Primary Care Crews Mccollam: Orson Eva Other Clinician: Betha Loa Referring Bethanne Mule: Orson Eva Treating [place] Zirkelbach/Extender: Altamese [place] in Treatment: 14 Vital Signs Time Taken: 15:48 Temperature (F): 98.5 Height (in): 65 Pulse (bpm): 57 Weight (lbs): 220 Respiratory Rate (breaths/min): 16 Body Mass Index (BMI): 36.6 Blood Pressure (mmHg): 100/64 Reference Range: 80 - 120 mg / dl Electronic Signature(s) Signed: 07/24/2022 8:24:32 AM By: Betha Loa Entered By: Betha Loa on 07/23/2022 15:51:43
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
## 2022-07-30 DIAGNOSIS — Z419 Encounter for procedure for purposes other than remedying health state, unspecified: Secondary | ICD-10-CM | POA: Diagnosis not present

## 2022-08-06 ENCOUNTER — Ambulatory Visit: Payer: Medicaid Other | Admitting: Physician Assistant

## 2022-08-13 ENCOUNTER — Encounter: Payer: Medicaid Other | Attending: Physician Assistant | Admitting: Physician Assistant

## 2022-08-13 DIAGNOSIS — L98495 Non-pressure chronic ulcer of skin of other sites with muscle involvement without evidence of necrosis: Secondary | ICD-10-CM | POA: Diagnosis not present

## 2022-08-13 DIAGNOSIS — Y838 Other surgical procedures as the cause of abnormal reaction of the patient, or of later complication, without mention of misadventure at the time of the procedure: Secondary | ICD-10-CM | POA: Insufficient documentation

## 2022-08-13 DIAGNOSIS — T8131XA Disruption of external operation (surgical) wound, not elsewhere classified, initial encounter: Secondary | ICD-10-CM | POA: Diagnosis not present

## 2022-08-15 NOTE — Progress Notes (Addendum)
Sara [name], Sara [name]. ([id number]) Visit Report for 08/13/2022 Arrival Information Details Patient Name: Sara [name]. Date of Service: 08/13/2022 3:45 PM Medical Record Number: [id number] Patient Account Number: [id number] Date of Birth/Sex: [birth year] (56 y.o. F) Treating RN: Huel Coventry Primary Care Bexton Haak: Orson Eva Other Clinician: Betha Loa Referring Charolett Yarrow: Orson Eva Treating Zakia Sainato/Extender: Rowan Blase in Treatment: 17 Visit Information History Since Last Visit All ordered tests and consults were completed: No Patient Arrived: Ambulatory Added or deleted any medications: No Arrival Time: 15:40 Any new allergies or adverse reactions: No Transfer Assistance: None Had [name] fall or experienced change in No Patient Requires Transmission-Based Precautions: No activities of daily living that may affect Patient Has Alerts: Yes risk of falls: Patient Alerts: NOT diabetic Hospitalized since last visit: No Pain Present Now: No Electronic Signature(s) Signed: 08/14/2022 1:47:09 PM By: Betha Loa Entered By: Betha Loa on 08/13/2022 15:41:05 Sara [name], Sara [name]. ([id number]) -------------------------------------------------------------------------------- Clinic Level of Care Assessment Details Patient Name: Sara [name]. Date of Service: 08/13/2022 3:45 PM Medical Record Number: [id number] Patient Account Number: [id number] Date of Birth/Sex: October 26, 1966 (56 y.o. F) Treating RN: Huel Coventry Primary Care Jamillah Camilo: Orson Eva Other Clinician: Betha Loa Referring Azriel Dancy: Orson Eva Treating Kateland Leisinger/Extender: Rowan Blase in Treatment: 17 Clinic Level of Care Assessment Items TOOL 4 Quantity Score []  - Use when only an EandM is performed on FOLLOW-UP visit 0 ASSESSMENTS - Nursing Assessment / Reassessment X - Reassessment of Co-morbidities (includes updates in patient status) 1 10 X- 1 5 Reassessment of Adherence to Treatment  Plan ASSESSMENTS - Wound and Skin Assessment / Reassessment X - Simple Wound Assessment / Reassessment - one wound 1 5 []  - 0 Complex Wound Assessment / Reassessment - multiple wounds []  - 0 Dermatologic / Skin Assessment (not related to wound area) ASSESSMENTS - Focused Assessment []  - Circumferential Edema Measurements - multi extremities 0 []  - 0 Nutritional Assessment / Counseling / Intervention []  - 0 Lower Extremity Assessment (monofilament, tuning fork, pulses) []  - 0 Peripheral Arterial Disease Assessment (using hand held doppler) ASSESSMENTS - Ostomy and/or Continence Assessment and Care []  - Incontinence Assessment and Management 0 []  - 0 Ostomy Care Assessment and Management (repouching, etc.) PROCESS - Coordination of Care X - Simple Patient / Family Education for ongoing care 1 15 []  - 0 Complex (extensive) Patient / Family Education for ongoing care []  - 0 Staff obtains , Records, Test Results / Process Orders []  - 0 Staff telephones HHA, Nursing Homes / Clarify orders / etc []  - 0 Routine Transfer to another Facility (non-emergent condition) []  - 0 Routine Hospital Admission (non-emergent condition) []  - 0 New Admissions / / Ordering NPWT, Apligraf, etc. []  - 0 Emergency Hospital Admission (emergent condition) X- 1 10 Simple Discharge Coordination []  - 0 Complex (extensive) Discharge Coordination PROCESS - Special Needs []  - Pediatric / Minor Patient Management 0 []  - 0 Isolation Patient Management []  - 0 Hearing / Language / Visual special needs []  - 0 Assessment of Community assistance (transportation, D/C planning, etc.) []  - 0 Additional assistance / Altered mentation []  - 0 Support Surface(s) Assessment (bed, cushion, seat, etc.) INTERVENTIONS - Wound Cleansing / Measurement Sara [name]. ( ) X- 1 5 Simple Wound Cleansing - one wound []  - 0 Complex Wound Cleansing - multiple wounds X- 1 5 Wound  Imaging (photographs - any number of wounds) []  - 0 Wound Tracing (instead of photographs) X- 1 5 Simple Wound Measurement - one wound []  -  0 Complex Wound Measurement - multiple wounds INTERVENTIONS - Wound Dressings []  - Small Wound Dressing one or multiple wounds 0 X- 1 15 Medium Wound Dressing one or multiple wounds []  - 0 Large Wound Dressing one or multiple wounds []  - 0 Application of Medications - topical []  - 0 Application of Medications - injection INTERVENTIONS - Miscellaneous []  - External ear exam 0 []  - 0 Specimen Collection (cultures, biopsies, blood, body fluids, etc.) []  - 0 Specimen(s) / Culture(s) sent or taken to Lab for analysis []  - 0 Patient Transfer (multiple staff / / Similar devices) []  - 0 Simple Staple / Suture removal (25 or less) []  - 0 Complex Staple / Suture removal (26 or more) []  - 0 Hypo / Hyperglycemic Management (close monitor of Blood Glucose) []  - 0 Ankle / Brachial Index (ABI) - do not check if billed separately X- 1 5 Vital Signs Has the patient been seen at the hospital within the last three years: Yes Total Score: 80 Level Of Care: New/Established - Level 3 Electronic Signature(s) Signed: 08/14/2022 1:47:09 PM By: Entered By: on 08/13/2022 16:19:48 Sara [name], Sara [name]. ( ) -------------------------------------------------------------------------------- Encounter Discharge Information Details Patient Name: Sara [name]. Date of Service: 08/13/2022 3:45 PM Medical Record Number: Patient Account Number: Nurse, adult Date of Birth/Sex: September 25, 1966 (56 y.o. F) Treating RN: Primary Care Cleotis Sparr: Other Clinician: 08/16/2022 Referring Sarya Linenberger: Betha Loa Treating Sophia Sperry/Extender: Betha Loa in Treatment: 17 Encounter Discharge Information Items Discharge Condition: Stable Ambulatory Status: Ambulatory Discharge Destination:  Home Transportation: Private Auto Accompanied By: Shelden Schedule Follow-up Appointment: Yes Clinical Summary of Care: Electronic Signature(s) Signed: 08/14/2022 1:47:09 PM By: 08/15/2022 Entered By: [id number] on 08/13/2022 16:20:48 Sara [name], Sara [name]. (03/22/1966) -------------------------------------------------------------------------------- Lower Extremity Assessment Details Patient Name: Laplant, Anitha [name]. Date of Service: 08/13/2022 3:45 PM Medical Record Number: Huel Coventry Patient Account Number: Orson Eva Date of Birth/Sex: 1966/12/22 (56 y.o. F) Treating RN: Rowan Blase Primary Care Grier Vu: 18 Other Clinician: 08/16/2022 Referring Mariusz Jubb: Betha Loa Treating Cheria Sadiq/Extender: Betha Loa in Treatment: 17 Electronic Signature(s) Signed: 08/13/2022 5:01:39 PM By: [id number] BSN, RN, CWS, Kim RN, BSN Signed: 08/14/2022 1:47:09 PM By: [id number] Entered By: [id number] on 08/13/2022 15:55:54 Sara [name], Sara [name]. (57) -------------------------------------------------------------------------------- Multi Wound Chart Details Patient Name: Korf, Mattia [name]. Date of Service: 08/13/2022 3:45 PM Medical Record Number: Orson Eva Patient Account Number: Betha Loa Date of Birth/Sex: 1966-06-06 (56 y.o. F) Treating RN: 08/15/2022 Primary Care Skylar Priest: Elliot Gurney Other Clinician: 08/16/2022 Referring Ladena Jacquez: Betha Loa Treating Jemmie Rhinehart/Extender: Betha Loa Weeks in Treatment: 17 Vital Signs Height(in): 65 Pulse(bpm): 70 Weight(lbs): 220 Blood Pressure(mmHg): 109/74 Body Mass Index(BMI): 36.6 Temperature(F): 98.0 Respiratory Rate(breaths/min): 16 Photos: [N/[name]:N/[name]] Wound Location: Abdomen - midline N/[name] N/[name] Wounding Event: Surgical Injury N/[name] N/[name] Primary Etiology: Open Surgical Wound N/[name] N/[name] Comorbid History: Asthma, Arrhythmia N/[name] N/[name] Date Acquired: 03/25/2022 N/[name] N/[name] Weeks of Treatment: 17 N/[name] N/[name] Wound Status: Open  N/[name] N/[name] Wound Recurrence: No N/[name] N/[name] Measurements L x W x D (cm) 0.4x0.7x0.1 N/[name] N/[name] Area (cm) : 0.22 N/[name] N/[name] Volume (cm) : 0.022 N/[name] N/[name] % Reduction in Area: 99.80% N/[name] N/[name] % Reduction in Volume: 100.00% N/[name] N/[name] Classification: Full Thickness With Exposed N/[name] N/[name] Support Structures Exudate Amount: Medium N/[name] N/[name] Exudate Type: Serosanguineous N/[name] N/[name] Exudate Color: red, brown N/[name] N/[name] Granulation Amount: Large (67-100%) N/[name] N/[name] Granulation Quality: Red, Hyper-granulation N/[name] N/[name] Necrotic Amount: Small (1-33%) N/[name] N/[name] Exposed Structures:  Fat Layer (Subcutaneous Tissue): N/[name] N/[name] Yes Fascia: No Tendon: No Muscle: No Joint: No Bone: No Epithelialization: Medium (34-66%) N/[name] N/[name] Treatment Notes Electronic Signature(s) Signed: 08/14/2022 1:47:09 PM By: Betha Loa Entered By: Betha Loa on 08/13/2022 15:56:08 Sara [name], Sara [name]. ([id number]) -------------------------------------------------------------------------------- Multi-Disciplinary Care Plan Details Patient Name: Fogelman, Charlize [name]. Date of Service: 08/13/2022 3:45 PM Medical Record Number: [id number] Patient Account Number: [id number] Date of Birth/Sex: 1966/03/09 (56 y.o. F) Treating RN: Huel Coventry Primary Care Twanna Resh: Orson Eva Other Clinician: Betha Loa Referring Cephus Tupy: Orson Eva Treating Temari Schooler/Extender: Allen Derry Weeks in Treatment: 17 Active Inactive Electronic Signature(s) Signed: 08/13/2022 5:01:39 PM By: Elliot Gurney, BSN, RN, CWS, Kim RN, BSN Signed: 08/14/2022 1:47:09 PM By: Betha Loa Entered By: Betha Loa on 08/13/2022 15:55:59 Sara [name], Sara [name]. ([id number]) -------------------------------------------------------------------------------- Pain Assessment Details Patient Name: Goetting, Michaela [name]. Date of Service: 08/13/2022 3:45 PM Medical Record Number: [id number] Patient Account Number: [id number] Date of Birth/Sex: [birth year] (56 y.o. F) Treating RN: Huel Coventry Primary Care  Jacquelynn Friend: Orson Eva Other Clinician: Betha Loa Referring Shemeca Lukasik: Orson Eva Treating Eldin Bonsell/Extender: Allen Derry Weeks in Treatment: 17 Active Problems Location of Pain Severity and Description of Pain Patient Has Paino No Site Locations Pain Management and Medication Current Pain Management: Electronic Signature(s) Signed: 08/13/2022 5:01:39 PM By: Elliot Gurney, BSN, RN, CWS, Kim RN, BSN Signed: 08/14/2022 1:47:09 PM By: Betha Loa Entered By: Betha Loa on 08/13/2022 15:45:29 Sara [name], Sara [name]. ([id number]) -------------------------------------------------------------------------------- Patient/Caregiver Education Details Patient Name: Sara [name], Sara [name]. Date of Service: 08/13/2022 3:45 PM Medical Record Number: [id number] Patient Account Number: [id number] Date of Birth/Gender: 11/01/1966 (56 y.o. F) Treating RN: Huel Coventry Primary Care Physician: Orson Eva Other Clinician: Betha Loa Referring Physician: Orson Eva Treating Physician/Extender: Rowan Blase in Treatment: 17 Education Assessment Education Provided To: Patient Education Topics Provided Wound/Skin Impairment: Handouts: Other: continue with wound care as directed Methods: Explain/Verbal Responses: State content correctly Electronic Signature(s) Signed: 08/14/2022 1:47:09 PM By: Betha Loa Entered By: Betha Loa on 08/13/2022 16:20:16 Sara [name], Sara [name]. ([id number]) -------------------------------------------------------------------------------- Wound Assessment Details Patient Name: Sara [name], Sara [name]. Date of Service: 08/13/2022 3:45 PM Medical Record Number: [id number] Patient Account Number: [id number] Date of Birth/Sex: June 28, 1966 (56 y.o. F) Treating RN: Huel Coventry Primary Care Yanelle Sousa: Orson Eva Other Clinician: Betha Loa Referring Ewan Grau: Orson Eva Treating Luticia Tadros/Extender: Allen Derry Weeks in Treatment: 17 Wound Status Wound Number:  1 Primary Etiology: Open Surgical Wound Wound Location: Abdomen - midline Wound Status: Open Wounding Event: Surgical Injury Comorbid History: Asthma, Arrhythmia Date Acquired: 03/25/2022 Weeks Of Treatment: 17 Clustered Wound: No Photos Wound Measurements Length: (cm) 0.4 Width: (cm) 0.7 Depth: (cm) 0.1 Area: (cm) 0.22 Volume: (cm) 0.022 % Reduction in Area: 99.8% % Reduction in Volume: 100% Epithelialization: Medium (34-66%) Wound Description Classification: Full Thickness With Exposed Support Structures Exudate Amount: Medium Exudate Type: Serosanguineous Exudate Color: red, brown Foul Odor After Cleansing: No Slough/Fibrino Yes Wound Bed Granulation Amount: Large (67-100%) Exposed Structure Granulation Quality: Red, Hyper-granulation Fascia Exposed: No Necrotic Amount: Small (1-33%) Fat Layer (Subcutaneous Tissue) Exposed: Yes Necrotic Quality: Adherent Slough Tendon Exposed: No Muscle Exposed: No Joint Exposed: No Bone Exposed: No Treatment Notes Wound #1 (Abdomen - midline) Cleanser Wound Cleanser Discharge Instruction: Wash your hands with soap and water. Remove old dressing, discard into plastic bag and place into trash. Cleanse the wound with Wound Cleanser prior to applying [name] clean dressing using gauze sponges, not tissues or cotton balls. Do not scrub or use excessive force. Pat dry using gauze sponges, not tissue or  cotton balls. Sara [name], Sara [name]. ([id number]) Peri-Wound Care Topical Primary Dressing Curad Oil Emulsion Dressing 3x3 (in/in) Secondary Dressing ABD Pad 5x9 (in/in) Discharge Instruction: Cover with ABD pad Secured With Medipore Tape - 95M Medipore H Soft Cloth Surgical Tape, 2x2 (in/yd) Compression Wrap Compression Stockings Add-Ons Electronic Signature(s) Signed: 08/13/2022 5:01:39 PM By: Elliot Gurney, BSN, RN, CWS, Kim RN, BSN Signed: 08/14/2022 1:47:09 PM By: Betha Loa Entered By: Betha Loa on 08/13/2022 15:55:25 Sara [name], Sara [name]. ([id number]) -------------------------------------------------------------------------------- Vitals Details Patient Name: Lira, Jerald [name]. Date of Service: 08/13/2022 3:45 PM Medical Record Number: [id number] Patient Account Number: [id number] Date of Birth/Sex: 01-23-1966 (56 y.o. F) Treating RN: Huel Coventry Primary Care Glorine Hanratty: Orson Eva Other Clinician: Betha Loa Referring Latavion Halls: Orson Eva Treating Brenon Antosh/Extender: Allen Derry Weeks in Treatment: 17 Vital Signs Time Taken: 15:41 Temperature (F): 98.0 Height (in): 65 Pulse (bpm): 70 Weight (lbs): 220 Respiratory Rate (breaths/min): 16 Body Mass Index (BMI): 36.6 Blood Pressure (mmHg): 109/74 Reference Range: 80 - 120 mg / dl Electronic Signature(s) Signed: 08/14/2022 1:47:09 PM By: Betha Loa Entered By: Betha Loa on 08/13/2022 15:45:24
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
## 2022-08-27 ENCOUNTER — Encounter: Payer: Medicaid Other | Admitting: Physician Assistant

## 2022-08-27 DIAGNOSIS — T8131XA Disruption of external operation (surgical) wound, not elsewhere classified, initial encounter: Secondary | ICD-10-CM | POA: Diagnosis not present

## 2022-08-27 DIAGNOSIS — L98495 Non-pressure chronic ulcer of skin of other sites with muscle involvement without evidence of necrosis: Secondary | ICD-10-CM | POA: Diagnosis not present
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
## 2022-08-28 DIAGNOSIS — S72452N Displaced supracondylar fracture without intracondylar extension of lower end of left femur, subsequent encounter for open fracture type IIIA, IIIB, or IIIC with nonunion: Secondary | ICD-10-CM | POA: Diagnosis not present

## 2022-08-28 DIAGNOSIS — M24562 Contracture, left knee: Secondary | ICD-10-CM | POA: Diagnosis not present

## 2022-08-28 DIAGNOSIS — S92061D Displaced intraarticular fracture of right calcaneus, subsequent encounter for fracture with routine healing: Secondary | ICD-10-CM | POA: Diagnosis not present

## 2022-08-30 NOTE — Progress Notes (Signed)
Lindenbaum, Meilin A. ([id number]) Visit Report for 08/27/2022 Arrival Information Details Patient Name: Perfect, Wiley A. Date of Service: 08/27/2022 3:45 PM Medical Record Number: [id number] Patient Account Number: [id number] Date of Birth/Sex: 1966/01/07 (56 y.o. Female) Treating RN: Huel Coventry Primary Care Eileen Kangas: Orson Eva Other Clinician: Betha Loa Referring Madge Therrien: Orson Eva Treating Logyn Kendrick/Extender: Rowan Blase in Treatment: 19 Visit Information History Since Last Visit All ordered tests and consults were completed: No Patient Arrived: Wheel Chair Added or deleted any medications: No Arrival Time: 16:00 Any new allergies or adverse reactions: No Transfer Assistance: EasyPivot Patient Lift Had a fall or experienced change in No activities of daily living that may affect Patient Requires Transmission-Based No risk of falls: Precautions: Hospitalized since last visit: No Patient Has Alerts: Yes Pain Present Now: No Patient Alerts: NOT diabetic Electronic Signature(s) Signed: 08/29/2022 4:44:47 PM By: Betha Loa Entered By: Betha Loa on 08/27/2022 16:00:51 Bonifas, Khadeeja A. ([id number]) -------------------------------------------------------------------------------- Clinic Level of Care Assessment Details Patient Name: Tibbs, Mandeep A. Date of Service: 08/27/2022 3:45 PM Medical Record Number: [id number] Patient Account Number: [id number] Date of Birth/Sex: Oct 04, 1966 (56 y.o. Female) Treating RN: Huel Coventry Primary Care Wister Hoefle: Orson Eva Other Clinician: Betha Loa Referring Dywane Peruski: Orson Eva Treating Adya Wirz/Extender: Rowan Blase in Treatment: 19 Clinic Level of Care Assessment Items TOOL 4 Quantity Score []  - Use when only an EandM is performed on FOLLOW-UP visit 0 ASSESSMENTS - Nursing Assessment / Reassessment X - Reassessment of Co-morbidities (includes updates in patient status) 1 10 X- 1 5 Reassessment  of Adherence to Treatment Plan ASSESSMENTS - Wound and Skin Assessment / Reassessment X - Simple Wound Assessment / Reassessment - one wound 1 5 []  - 0 Complex Wound Assessment / Reassessment - multiple wounds []  - 0 Dermatologic / Skin Assessment (not related to wound area) ASSESSMENTS - Focused Assessment []  - Circumferential Edema Measurements - multi extremities 0 []  - 0 Nutritional Assessment / Counseling / Intervention []  - 0 Lower Extremity Assessment (monofilament, tuning fork, pulses) []  - 0 Peripheral Arterial Disease Assessment (using hand held doppler) ASSESSMENTS - Ostomy and/or Continence Assessment and Care []  - Incontinence Assessment and Management 0 []  - 0 Ostomy Care Assessment and Management (repouching, etc.) PROCESS - Coordination of Care X - Simple Patient / Family Education for ongoing care 1 15 []  - 0 Complex (extensive) Patient / Family Education for ongoing care []  - 0 Staff obtains , Records, Test Results / Process Orders []  - 0 Staff telephones HHA, Nursing Homes / Clarify orders / etc []  - 0 Routine Transfer to another Facility (non-emergent condition) []  - 0 Routine Hospital Admission (non-emergent condition) []  - 0 New Admissions / / Ordering NPWT, Apligraf, etc. []  - 0 Emergency Hospital Admission (emergent condition) X- 1 10 Simple Discharge Coordination []  - 0 Complex (extensive) Discharge Coordination PROCESS - Special Needs []  - Pediatric / Minor Patient Management 0 []  - 0 Isolation Patient Management []  - 0 Hearing / Language / Visual special needs []  - 0 Assessment of Community assistance (transportation, D/C planning, etc.) []  - 0 Additional assistance / Altered mentation []  - 0 Support Surface(s) Assessment (bed, cushion, seat, etc.) INTERVENTIONS - Wound Cleansing / Measurement Kluever, Anahit A. ( ) X- 1 5 Simple Wound Cleansing - one wound []  - 0 Complex Wound Cleansing -  multiple wounds X- 1 5 Wound Imaging (photographs - any number of wounds) []  - 0 Wound Tracing (instead of photographs) X- 1 5 Simple Wound Measurement - one  wound []  - 0 Complex Wound Measurement - multiple wounds INTERVENTIONS - Wound Dressings []  - Small Wound Dressing one or multiple wounds 0 X- 1 15 Medium Wound Dressing one or multiple wounds []  - 0 Large Wound Dressing one or multiple wounds []  - 0 Application of Medications - topical []  - 0 Application of Medications - injection INTERVENTIONS - Miscellaneous []  - External ear exam 0 []  - 0 Specimen Collection (cultures, biopsies, blood, body fluids, etc.) []  - 0 Specimen(s) / Culture(s) sent or taken to Lab for analysis []  - 0 Patient Transfer (multiple staff / / Similar devices) []  - 0 Simple Staple / Suture removal (25 or less) []  - 0 Complex Staple / Suture removal (26 or more) []  - 0 Hypo / Hyperglycemic Management (close monitor of Blood Glucose) []  - 0 Ankle / Brachial Index (ABI) - do not check if billed separately X- 1 5 Vital Signs Has the patient been seen at the hospital within the last three years: Yes Total Score: 80 Level Of Care: New/Established - Level 3 Electronic Signature(s) Signed: 08/29/2022 4:44:47 PM By: Entered By: on 08/27/2022 16:27:52 Corsino, Jonte A. ( ) -------------------------------------------------------------------------------- Encounter Discharge Information Details Patient Name: Broaden, Patra A. Date of Service: 08/27/2022 3:45 PM Medical Record Number: Patient Account Number: Date of Birth/Sex: [birth year] (56 y.o. Female) Treating RN: Primary Care Dedrick Heffner: Other Clinician: Referring Sidda Humm: 08/31/2022 Treating Marcio Hoque/Extender: Betha Loa in Treatment: 19 Encounter Discharge Information Items Discharge Condition: Stable Ambulatory Status:  Wheelchair Discharge Destination: Home Transportation: Private Auto Accompanied By: Upperman Schedule Follow-up Appointment: Yes Clinical Summary of Care: Electronic Signature(s) Signed: 08/29/2022 4:44:47 PM By: [id number] Entered By: 08/29/2022 on 08/27/2022 16:38:32 Manter, Piya A. ([id number]) -------------------------------------------------------------------------------- Lower Extremity Assessment Details Patient Name: Mier, Dezeray A. Date of Service: 08/27/2022 3:45 PM Medical Record Number: [id number] Patient Account Number: Huel Coventry Date of Birth/Sex: 12-26-1966 (56 y.o. Female) Treating RN: Orson Eva Primary Care Yorley Buch: Rowan Blase Other Clinician: 20 Referring Shawnte Demarest: 08/31/2022 Treating Eniola Cerullo/Extender: Betha Loa Weeks in Treatment: 19 Electronic Signature(s) Signed: 08/29/2022 4:44:47 PM By: 08/29/2022 Signed: 08/29/2022 6:20:35 PM By: 08/29/2022, BSN, RN, CWS, Kim RN, BSN Entered By: [id number] on 08/27/2022 16:14:06 Stallworth, Donelda A. (03/22/1966) -------------------------------------------------------------------------------- Multi Wound Chart Details Patient Name: Shreeve, Joclyn A. Date of Service: 08/27/2022 3:45 PM Medical Record Number: Huel Coventry Patient Account Number: Orson Eva Date of Birth/Sex: 06/11/1966 (56 y.o. Female) Treating RN: Allen Derry Primary Care Diahann Guajardo: 08/31/2022 Other Clinician: Betha Loa Referring Media Pizzini: 08/31/2022 Treating Ridge Lafond/Extender: Elliot Gurney Weeks in Treatment: 19 Vital Signs Height(in): 65 Pulse(bpm): 63 Weight(lbs): 220 Blood Pressure(mmHg): 125/81 Body Mass Index(BMI): 36.6 Temperature(F): 98.5 Respiratory Rate(breaths/min): 18 Photos: [N/A:N/A] Wound Location: Abdomen - midline N/A N/A Wounding Event: Surgical Injury N/A N/A Primary Etiology: Open Surgical Wound N/A N/A Comorbid History: Asthma, Arrhythmia N/A N/A Date Acquired: 03/25/2022 N/A N/A Weeks  of Treatment: 19 N/A N/A Wound Status: Open N/A N/A Wound Recurrence: No N/A N/A Measurements L x W x D (cm) 7.5x3x0.1 N/A N/A Area (cm) : 17.671 N/A N/A Volume (cm) : 1.767 N/A N/A % Reduction in Area: 82.90% N/A N/A % Reduction in Volume: 98.80% N/A N/A Classification: Full Thickness With Exposed N/A N/A Support Structures Exudate Amount: Medium N/A N/A Exudate Type: Serosanguineous N/A N/A Exudate Color: red, brown N/A N/A Granulation Amount: Large (67-100%) N/A N/A Granulation Quality: Red N/A N/A Necrotic Amount: None Present (0%) N/A  N/A Exposed Structures: Fat Layer (Subcutaneous Tissue): N/A N/A Yes Fascia: No Tendon: No Muscle: No Joint: No Bone: No Epithelialization: Medium (34-66%) N/A N/A Treatment Notes Electronic Signature(s) Signed: 08/29/2022 4:44:47 PM By: Betha Loa Entered By: Betha Loa on 08/27/2022 16:14:20 Pack, Flonnie A. ([id number]) -------------------------------------------------------------------------------- Multi-Disciplinary Care Plan Details Patient Name: Hert, Ling A. Date of Service: 08/27/2022 3:45 PM Medical Record Number: [id number] Patient Account Number: [id number] Date of Birth/Sex: [birth year] (56 y.o. Female) Treating RN: Huel Coventry Primary Care Jerome Otter: Orson Eva Other Clinician: Betha Loa Referring Lacye Mccarn: Orson Eva Treating Amandeep Hogston/Extender: Allen Derry Weeks in Treatment: 19 Active Inactive Electronic Signature(s) Signed: 08/29/2022 4:44:47 PM By: Betha Loa Signed: 08/29/2022 6:20:35 PM By: Elliot Gurney, BSN, RN, CWS, Kim RN, BSN Entered By: Betha Loa on 08/27/2022 16:14:10 Higashi, Camira A. ([id number]) -------------------------------------------------------------------------------- Pain Assessment Details Patient Name: Speir, Aemilia A. Date of Service: 08/27/2022 3:45 PM Medical Record Number: [id number] Patient Account Number: [id number] Date of Birth/Sex: 1966-05-11 (57 y.o.  Female) Treating RN: Huel Coventry Primary Care Donne Baley: Orson Eva Other Clinician: Betha Loa Referring Alyissa Whidbee: Orson Eva Treating Kialee Kham/Extender: Allen Derry Weeks in Treatment: 19 Active Problems Location of Pain Severity and Description of Pain Patient Has Paino No Site Locations Pain Management and Medication Current Pain Management: Electronic Signature(s) Signed: 08/29/2022 4:44:47 PM By: Betha Loa Signed: 08/29/2022 6:20:35 PM By: Elliot Gurney, BSN, RN, CWS, Kim RN, BSN Entered By: Betha Loa on 08/27/2022 16:06:35 Freeberg, Young Berry ([id number]) -------------------------------------------------------------------------------- Patient/Caregiver Education Details Patient Name: Chilcott, Josetta A. Date of Service: 08/27/2022 3:45 PM Medical Record Number: [id number] Patient Account Number: [id number] Date of Birth/Gender: May 23, 1966 (56 y.o. Female) Treating RN: Huel Coventry Primary Care Physician: Orson Eva Other Clinician: Betha Loa Referring Physician: Orson Eva Treating Physician/Extender: Rowan Blase in Treatment: 19 Education Assessment Education Provided To: Patient Education Topics Provided Wound/Skin Impairment: Handouts: Other: continue wound care as directed Methods: Explain/Verbal Responses: State content correctly Electronic Signature(s) Signed: 08/29/2022 4:44:47 PM By: Betha Loa Entered By: Betha Loa on 08/27/2022 16:28:27 Grajales, Riot A. ([id number]) -------------------------------------------------------------------------------- Wound Assessment Details Patient Name: Kostelecky, Avelyn A. Date of Service: 08/27/2022 3:45 PM Medical Record Number: [id number] Patient Account Number: [id number] Date of Birth/Sex: [birth year] (56 y.o. Female) Treating RN: Huel Coventry Primary Care Jennipher Weatherholtz: Orson Eva Other Clinician: Betha Loa Referring Jaysean Manville: Orson Eva Treating Delena Casebeer/Extender: Allen Derry Weeks in Treatment: 19 Wound Status Wound Number: 1 Primary Etiology: Open Surgical Wound Wound Location: Abdomen - midline Wound Status: Open Wounding Event: Surgical Injury Comorbid History: Asthma, Arrhythmia Date Acquired: 03/25/2022 Weeks Of Treatment: 19 Clustered Wound: No Photos Wound Measurements Length: (cm) 7.5 Width: (cm) 3 Depth: (cm) 0.1 Area: (cm) 17.671 Volume: (cm) 1.767 % Reduction in Area: 82.9% % Reduction in Volume: 98.8% Epithelialization: Medium (34-66%) Tunneling: No Undermining: No Wound Description Classification: Full Thickness With Exposed Support Structures Exudate Amount: Medium Exudate Type: Serosanguineous Exudate Color: red, brown Foul Odor After Cleansing: No Slough/Fibrino Yes Wound Bed Granulation Amount: Large (67-100%) Exposed Structure Granulation Quality: Red Fascia Exposed: No Necrotic Amount: None Present (0%) Fat Layer (Subcutaneous Tissue) Exposed: Yes Tendon Exposed: No Muscle Exposed: No Joint Exposed: No Bone Exposed: No Treatment Notes Wound #1 (Abdomen - midline) Cleanser Wound Cleanser Discharge Instruction: Wash your hands with soap and water. Remove old dressing, discard into plastic bag and place into trash. Cleanse the wound with Wound Cleanser prior to applying a clean dressing using gauze sponges, not tissues or cotton balls. Do not scrub or use excessive force. Pat dry using gauze sponges, not  tissue or cotton balls. Marsee, Tamilyn A. ([id number]) Peri-Wound Care Topical Primary Dressing Vaseline Petrolatum Gauze Strip, 3x9 (in/in) Discharge Instruction: double layer Secondary Dressing ABD Pad 5x9 (in/in) Discharge Instruction: Cover with ABD pad Secured With Medipore Tape - 26M Medipore H Soft Cloth Surgical Tape, 2x2 (in/yd) Compression Wrap Compression Stockings Add-Ons Electronic Signature(s) Signed: 08/29/2022 4:44:47 PM By: Betha Loa Signed: 08/29/2022 6:20:35 PM By: Elliot Gurney, BSN, RN,  CWS, Kim RN, BSN Entered By: Betha Loa on 08/27/2022 16:13:42 Dye, Minh A. ([id number]) -------------------------------------------------------------------------------- Vitals Details Patient Name: Colley, Kionna A. Date of Service: 08/27/2022 3:45 PM Medical Record Number: [id number] Patient Account Number: [id number] Date of Birth/Sex: [birth year] (56 y.o. Female) Treating RN: Huel Coventry Primary Care Josedaniel Haye: Orson Eva Other Clinician: Betha Loa Referring Willona Phariss: Orson Eva Treating Dyson Sevey/Extender: Allen Derry Weeks in Treatment: 19 Vital Signs Time Taken: 16:01 Temperature (F): 98.5 Height (in): 65 Pulse (bpm): 63 Weight (lbs): 220 Respiratory Rate (breaths/min): 18 Body Mass Index (BMI): 36.6 Blood Pressure (mmHg): 125/81 Reference Range: 80 - 120 mg / dl Electronic Signature(s) Signed: 08/29/2022 4:44:47 PM By: Betha Loa Entered By: Betha Loa on 08/27/2022 16:06:30

## 2022-09-17 ENCOUNTER — Other Ambulatory Visit: Payer: Self-pay | Admitting: Orthopedic Surgery

## 2022-09-17 ENCOUNTER — Encounter: Payer: Medicaid Other | Attending: Physician Assistant | Admitting: Physician Assistant

## 2022-09-17 DIAGNOSIS — T8131XD Disruption of external operation (surgical) wound, not elsewhere classified, subsequent encounter: Secondary | ICD-10-CM | POA: Insufficient documentation

## 2022-09-17 DIAGNOSIS — Z8619 Personal history of other infectious and parasitic diseases: Secondary | ICD-10-CM | POA: Insufficient documentation

## 2022-09-17 DIAGNOSIS — L98495 Non-pressure chronic ulcer of skin of other sites with muscle involvement without evidence of necrosis: Secondary | ICD-10-CM | POA: Insufficient documentation

## 2022-09-17 DIAGNOSIS — S72452N Displaced supracondylar fracture without intracondylar extension of lower end of left femur, subsequent encounter for open fracture type IIIA, IIIB, or IIIC with nonunion: Secondary | ICD-10-CM

## 2022-09-17 NOTE — Progress Notes (Addendum)
Skeen, Samani A. ([id number]) Visit Report for 09/17/2022 Arrival Information Details Patient Name: Sara [name], [name] Date of Service: 09/17/2022 3:00 PM Medical Record Number: [id number] Patient Account Number: [id number] Date of Birth/Sex: 02/11/1966 (56 y.o. F) Treating RN: Carlene Coria Primary Care Todd Argabright: Evern Bio Other Clinician: Massie Kluver Referring Charne Mcbrien: Evern Bio Treating Lochlyn Zullo/Extender: Skipper Cliche in Treatment: 9 Visit Information History Since Last Visit All ordered tests and consults were completed: No Patient Arrived: Wheel Chair Added or deleted any medications: No Arrival Time: 15:27 Any new allergies or adverse reactions: No Transfer Assistance: EasyPivot Patient Lift Had a fall or experienced change in No activities of daily living that may affect Patient Requires Transmission-Based No risk of falls: Precautions: Hospitalized since last visit: No Patient Has Alerts: Yes Pain Present Now: No Patient Alerts: NOT diabetic Electronic Signature(s) Signed: 09/19/2022 11:41:41 AM By: Massie Kluver Entered By: Massie Kluver on 09/17/2022 15:28:36 Livas, Drena A. ([id number]) -------------------------------------------------------------------------------- Clinic Level of Care Assessment Details Patient Name: [name], [name] Date of Service: 09/17/2022 3:00 PM Medical Record Number: [id number] Patient Account Number: [id number] Date of Birth/Sex: 09-16-1966 (56 y.o. F) Treating RN: Carlene Coria Primary Care Ashtan Girtman: Evern Bio Other Clinician: Massie Kluver Referring Endya Austin: Evern Bio Treating Kennith Morss/Extender: Skipper Cliche in Treatment: 22 Clinic Level of Care Assessment Items TOOL 4 Quantity Score []  - Use when only an EandM is performed on FOLLOW-UP visit 0 ASSESSMENTS - Nursing Assessment / Reassessment X - Reassessment of Co-morbidities (includes updates in patient status) 1 10 X- 1 5 Reassessment of  Adherence to Treatment Plan ASSESSMENTS - Wound and Skin Assessment / Reassessment X - Simple Wound Assessment / Reassessment - one wound 1 5 []  - 0 Complex Wound Assessment / Reassessment - multiple wounds []  - 0 Dermatologic / Skin Assessment (not related to wound area) ASSESSMENTS - Focused Assessment []  - Circumferential Edema Measurements - multi extremities 0 []  - 0 Nutritional Assessment / Counseling / Intervention []  - 0 Lower Extremity Assessment (monofilament, tuning fork, pulses) []  - 0 Peripheral Arterial Disease Assessment (using hand held doppler) ASSESSMENTS - Ostomy and/or Continence Assessment and Care []  - Incontinence Assessment and Management 0 []  - 0 Ostomy Care Assessment and Management (repouching, etc.) PROCESS - Coordination of Care X - Simple Patient / Family Education for ongoing care 1 15 []  - 0 Complex (extensive) Patient / Family Education for ongoing care []  - 0 Staff obtains Programmer, systems, Records, Test Results / Process Orders []  - 0 Staff telephones HHA, Nursing Homes / Clarify orders / etc []  - 0 Routine Transfer to another Facility (non-emergent condition) []  - 0 Routine Hospital Admission (non-emergent condition) []  - 0 New Admissions / Biomedical engineer / Ordering NPWT, Apligraf, etc. []  - 0 Emergency Hospital Admission (emergent condition) X- 1 10 Simple Discharge Coordination []  - 0 Complex (extensive) Discharge Coordination PROCESS - Special Needs []  - Pediatric / Minor Patient Management 0 []  - 0 Isolation Patient Management []  - 0 Hearing / Language / Visual special needs []  - 0 Assessment of Community assistance (transportation, D/C planning, etc.) []  - 0 Additional assistance / Altered mentation []  - 0 Support Surface(s) Assessment (bed, cushion, seat, etc.) INTERVENTIONS - Wound Cleansing / Measurement Whobrey, Zendaya A. ([id number]) X- 1 5 Simple Wound Cleansing - one wound []  - 0 Complex Wound Cleansing - multiple  wounds X- 1 5 Wound Imaging (photographs - any number of wounds) []  - 0 Wound Tracing (instead of photographs) X- 1 5 Simple Wound Measurement - one  wound []  - 0 Complex Wound Measurement - multiple wounds INTERVENTIONS - Wound Dressings []  - Small Wound Dressing one or multiple wounds 0 X- 1 15 Medium Wound Dressing one or multiple wounds []  - 0 Large Wound Dressing one or multiple wounds []  - 0 Application of Medications - topical []  - 0 Application of Medications - injection INTERVENTIONS - Miscellaneous []  - External ear exam 0 []  - 0 Specimen Collection (cultures, biopsies, blood, body fluids, etc.) []  - 0 Specimen(s) / Culture(s) sent or taken to Lab for analysis []  - 0 Patient Transfer (multiple staff / Civil Service fast streamer / Similar devices) []  - 0 Simple Staple / Suture removal (25 or less) []  - 0 Complex Staple / Suture removal (26 or more) []  - 0 Hypo / Hyperglycemic Management (close monitor of Blood Glucose) []  - 0 Ankle / Brachial Index (ABI) - do not check if billed separately X- 1 5 Vital Signs Has the patient been seen at the hospital within the last three years: Yes Total Score: 80 Level Of Care: New/Established - Level 3 Electronic Signature(s) Signed: 09/19/2022 11:41:41 AM By: Massie Kluver Entered By: Massie Kluver on 09/17/2022 15:52:14 Devinney, Gracie A. ([id number]) -------------------------------------------------------------------------------- Lower Extremity Assessment Details Patient Name: Sara [name] A. Date of Service: 09/17/2022 3:00 PM Medical Record Number: [id number] Patient Account Number: [id number] Date of Birth/Sex: January 08, 1966 (56 y.o. F) Treating RN: Carlene Coria Primary Care Keywon Mestre: Evern Bio Other Clinician: Massie Kluver Referring Emilya Justen: Evern Bio Treating Colbie Danner/Extender: Jeri Cos Weeks in Treatment: 22 Electronic Signature(s) Signed: 09/19/2022 11:41:41 AM By: Massie Kluver Signed: 09/20/2022 9:44:45 AM  By: Carlene Coria RN Entered By: Massie Kluver on 09/17/2022 15:36:49 Strahle, Ewa A. ([id number]) -------------------------------------------------------------------------------- Multi Wound Chart Details Patient Name: Budai, Kelle A. Date of Service: 09/17/2022 3:00 PM Medical Record Number: [id number] Patient Account Number: [id number] Date of Birth/Sex: [birth year] (56 y.o. F) Treating RN: Carlene Coria Primary Care Abreanna Drawdy: Evern Bio Other Clinician: Massie Kluver Referring Daren Yeagle: Evern Bio Treating Azalynn Maxim/Extender: Jeri Cos Weeks in Treatment: 22 Vital Signs Height(in): 65 Pulse(bpm): 71 Weight(lbs): 220 Blood Pressure(mmHg): 110/73 Body Mass Index(BMI): 36.6 Temperature(F): 98.4 Respiratory Rate(breaths/min): 18 Photos: [N/A:N/A] Wound Location: Abdomen - midline N/A N/A Wounding Event: Surgical Injury N/A N/A Primary Etiology: Open Surgical Wound N/A N/A Comorbid History: Asthma, Arrhythmia N/A N/A Date Acquired: 03/25/2022 N/A N/A Weeks of Treatment: 22 N/A N/A Wound Status: Open N/A N/A Wound Recurrence: No N/A N/A Measurements L x W x D (cm) 3x3.4x0.1 N/A N/A Area (cm) : 8.011 N/A N/A Volume (cm) : 0.801 N/A N/A % Reduction in Area: 92.20% N/A N/A % Reduction in Volume: 99.40% N/A N/A Classification: Full Thickness With Exposed N/A N/A Support Structures Exudate Amount: Medium N/A N/A Exudate Type: Serosanguineous N/A N/A Exudate Color: red, brown N/A N/A Granulation Amount: Large (67-100%) N/A N/A Granulation Quality: Red N/A N/A Necrotic Amount: None Present (0%) N/A N/A Exposed Structures: Fat Layer (Subcutaneous Tissue): N/A N/A Yes Fascia: No Tendon: No Muscle: No Joint: No Bone: No Epithelialization: Medium (34-66%) N/A N/A Treatment Notes Electronic Signature(s) Signed: 09/19/2022 11:41:41 AM By: Massie Kluver Entered By: Massie Kluver on 09/17/2022 15:37:03 Hsiung, Shronda A.  ([id number]) -------------------------------------------------------------------------------- Multi-Disciplinary Care Plan Details Patient Name: Chavero, Myda A. Date of Service: 09/17/2022 3:00 PM Medical Record Number: [id number] Patient Account Number: [id number] Date of Birth/Sex: 1966/10/12 (56 y.o. F) Treating RN: Carlene Coria Primary Care Jaselle Pryer: Evern Bio Other Clinician: Massie Kluver Referring Shraddha Lebron: Evern Bio Treating Markeise Mathews/Extender: Jeri Cos Weeks in Treatment: 22 Active Inactive Electronic Signature(s)  Signed: 09/19/2022 11:41:41 AM By: Massie Kluver Signed: 09/20/2022 9:44:45 AM By: Carlene Coria RN Entered By: Massie Kluver on 09/17/2022 15:36:53 Lecker, Sundra A. ([id number]) -------------------------------------------------------------------------------- Pain Assessment Details Patient Name: Edler, Anjeli A. Date of Service: 09/17/2022 3:00 PM Medical Record Number: [id number] Patient Account Number: [id number] Date of Birth/Sex: 1966-09-06 (56 y.o. F) Treating RN: Carlene Coria Primary Care Crystalee Ventress: Evern Bio Other Clinician: Massie Kluver Referring Terrill Alperin: Evern Bio Treating Taran Haynesworth/Extender: Jeri Cos Weeks in Treatment: 22 Active Problems Location of Pain Severity and Description of Pain Patient Has Paino No Site Locations Pain Management and Medication Current Pain Management: Electronic Signature(s) Signed: 09/19/2022 11:41:41 AM By: Massie Kluver Signed: 09/20/2022 9:44:45 AM By: Carlene Coria RN Entered By: Massie Kluver on 09/17/2022 15:31:59 Goucher, Kiyla A. ([id number]) -------------------------------------------------------------------------------- Patient/Caregiver Education Details Patient Name: Crull, Tehani A. Date of Service: 09/17/2022 3:00 PM Medical Record Number: [id number] Patient Account Number: [id number] Date of Birth/Gender: 01-18-1966 (56 y.o. F) Treating RN: Carlene Coria Primary Care  Physician: Evern Bio Other Clinician: Massie Kluver Referring Physician: Evern Bio Treating Physician/Extender: Skipper Cliche in Treatment: 22 Education Assessment Education Provided To: Patient Education Topics Provided Wound/Skin Impairment: Handouts: Other: continue wound care as directed Methods: Explain/Verbal Responses: State content correctly Electronic Signature(s) Signed: 09/19/2022 11:41:41 AM By: Massie Kluver Entered By: Massie Kluver on 09/17/2022 15:52:44 Muha, Shaqueena A. ([id number]) -------------------------------------------------------------------------------- Wound Assessment Details Patient Name: Mankins, Fey A. Date of Service: 09/17/2022 3:00 PM Medical Record Number: [id number] Patient Account Number: [id number] Date of Birth/Sex: 02/07/1966 (56 y.o. F) Treating RN: Carlene Coria Primary Care Pearley Baranek: Evern Bio Other Clinician: Massie Kluver Referring Alyvia Derk: Evern Bio Treating Gayleen Sholtz/Extender: Jeri Cos Weeks in Treatment: 22 Wound Status Wound Number: 1 Primary Etiology: Open Surgical Wound Wound Location: Abdomen - midline Wound Status: Open Wounding Event: Surgical Injury Comorbid History: Asthma, Arrhythmia Date Acquired: 03/25/2022 Weeks Of Treatment: 22 Clustered Wound: No Photos Wound Measurements Length: (cm) 3 Width: (cm) 3.4 Depth: (cm) 0.1 Area: (cm) 8.011 Volume: (cm) 0.801 % Reduction in Area: 92.2% % Reduction in Volume: 99.4% Epithelialization: Medium (34-66%) Wound Description Classification: Full Thickness With Exposed Support Structures Exudate Amount: Medium Exudate Type: Serosanguineous Exudate Color: red, brown Foul Odor After Cleansing: No Slough/Fibrino Yes Wound Bed Granulation Amount: Large (67-100%) Exposed Structure Granulation Quality: Red Fascia Exposed: No Necrotic Amount: None Present (0%) Fat Layer (Subcutaneous Tissue) Exposed: Yes Tendon Exposed: No Muscle  Exposed: No Joint Exposed: No Bone Exposed: No Electronic Signature(s) Signed: 09/19/2022 11:41:41 AM By: Massie Kluver Signed: 09/20/2022 9:44:45 AM By: Carlene Coria RN Entered By: Massie Kluver on 09/17/2022 15:36:25 Lafalce, Shalae A. ([id number]) -------------------------------------------------------------------------------- Vitals Details Patient Name: Winterrowd, Yamilex A. Date of Service: 09/17/2022 3:00 PM Medical Record Number: [id number] Patient Account Number: [id number] Date of Birth/Sex: 05-30-1966 (56 y.o. F) Treating RN: Carlene Coria Primary Care Dillyn Joaquin: Evern Bio Other Clinician: Massie Kluver Referring Ethlyn Alto: Evern Bio Treating Josel Keo/Extender: Jeri Cos Weeks in Treatment: 22 Vital Signs Time Taken: 15:29 Temperature (F): 98.4 Height (in): 65 Pulse (bpm): 71 Weight (lbs): 220 Respiratory Rate (breaths/min): 18 Body Mass Index (BMI): 36.6 Blood Pressure (mmHg): 110/73 Reference Range: 80 - 120 mg / dl Electronic Signature(s) Signed: 09/19/2022 11:41:41 AM By: Massie Kluver Entered By: Massie Kluver on 09/17/2022 15:31:52
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
## 2022-09-20 ENCOUNTER — Ambulatory Visit
Admission: RE | Admit: 2022-09-20 | Discharge: 2022-09-20 | Disposition: A | Discharge status: Home / Self Care | Payer: Medicaid Other | Source: Ambulatory Visit | Attending: Orthopedic Surgery | Admitting: Orthopedic Surgery

## 2022-09-20 DIAGNOSIS — S72452N Displaced supracondylar fracture without intracondylar extension of lower end of left femur, subsequent encounter for open fracture type IIIA, IIIB, or IIIC with nonunion: Secondary | ICD-10-CM

## 2022-10-01 ENCOUNTER — Encounter: Payer: Medicaid Other | Attending: Physician Assistant | Admitting: Physician Assistant

## 2022-10-01 DIAGNOSIS — T8131XA Disruption of external operation (surgical) wound, not elsewhere classified, initial encounter: Secondary | ICD-10-CM | POA: Insufficient documentation

## 2022-10-01 DIAGNOSIS — L98495 Non-pressure chronic ulcer of skin of other sites with muscle involvement without evidence of necrosis: Secondary | ICD-10-CM | POA: Insufficient documentation
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
## 2022-10-02 NOTE — Progress Notes (Signed)
Golightly, Keylani A. ([id number]) Visit Report for 10/01/2022 Arrival Information Details Patient Name: Sara [name], [name] Date of Service: 10/01/2022 3:00 PM Medical Record Number: [id number] Patient Account Number: [id number] Date of Birth/Sex: 1966/09/05 (56 y.o. F) Treating RN: Huel Coventry Primary Care Anvita Hirata: Orson Eva Other Clinician: Betha Loa Referring Husam Hohn: Orson Eva Treating Randee Huston/Extender: Rowan Blase in Treatment: 24 Visit Information History Since Last Visit All ordered tests and consults were completed: No Patient Arrived: Dan Humphreys Added or deleted any medications: No Arrival Time: 15:36 Any new allergies or adverse reactions: No Transfer Assistance: None Had a fall or experienced change in No Patient Requires Transmission-Based Precautions: No activities of daily living that may affect Patient Has Alerts: Yes risk of falls: Patient Alerts: NOT diabetic Hospitalized since last visit: No Pain Present Now: No Electronic Signature(s) Signed: 10/01/2022 5:15:02 PM By: Betha Loa Entered By: Betha Loa on 10/01/2022 15:37:14 Daniel, Ariani A. ([id number]) -------------------------------------------------------------------------------- Clinic Level of Care Assessment Details Patient Name: Kristiansen, Miniya A. Date of Service: 10/01/2022 3:00 PM Medical Record Number: [id number] Patient Account Number: [id number] Date of Birth/Sex: [birth year] (56 y.o. F) Treating RN: Huel Coventry Primary Care Celesta Funderburk: Orson Eva Other Clinician: Betha Loa Referring Demaria Deeney: Orson Eva Treating Amarissa Koerner/Extender: Rowan Blase in Treatment: 24 Clinic Level of Care Assessment Items TOOL 4 Quantity Score []  - Use when only an EandM is performed on FOLLOW-UP visit 0 ASSESSMENTS - Nursing Assessment / Reassessment X - Reassessment of Co-morbidities (includes updates in patient status) 1 10 X- 1 5 Reassessment of Adherence to Treatment  Plan ASSESSMENTS - Wound and Skin Assessment / Reassessment X - Simple Wound Assessment / Reassessment - one wound 1 5 []  - 0 Complex Wound Assessment / Reassessment - multiple wounds []  - 0 Dermatologic / Skin Assessment (not related to wound area) ASSESSMENTS - Focused Assessment []  - Circumferential Edema Measurements - multi extremities 0 []  - 0 Nutritional Assessment / Counseling / Intervention []  - 0 Lower Extremity Assessment (monofilament, tuning fork, pulses) []  - 0 Peripheral Arterial Disease Assessment (using hand held doppler) ASSESSMENTS - Ostomy and/or Continence Assessment and Care []  - Incontinence Assessment and Management 0 []  - 0 Ostomy Care Assessment and Management (repouching, etc.) PROCESS - Coordination of Care X - Simple Patient / Family Education for ongoing care 1 15 []  - 0 Complex (extensive) Patient / Family Education for ongoing care []  - 0 Staff obtains , Records, Test Results / Process Orders []  - 0 Staff telephones HHA, Nursing Homes / Clarify orders / etc []  - 0 Routine Transfer to another Facility (non-emergent condition) []  - 0 Routine Hospital Admission (non-emergent condition) []  - 0 New Admissions / / Ordering NPWT, Apligraf, etc. []  - 0 Emergency Hospital Admission (emergent condition) X- 1 10 Simple Discharge Coordination []  - 0 Complex (extensive) Discharge Coordination PROCESS - Special Needs []  - Pediatric / Minor Patient Management 0 []  - 0 Isolation Patient Management []  - 0 Hearing / Language / Visual special needs []  - 0 Assessment of Community assistance (transportation, D/C planning, etc.) []  - 0 Additional assistance / Altered mentation []  - 0 Support Surface(s) Assessment (bed, cushion, seat, etc.) INTERVENTIONS - Wound Cleansing / Measurement Muilenburg, Uzma A. ( ) X- 1 5 Simple Wound Cleansing - one wound []  - 0 Complex Wound Cleansing - multiple wounds X- 1 5 Wound  Imaging (photographs - any number of wounds) []  - 0 Wound Tracing (instead of photographs) []  - 0 Simple Wound Measurement - one wound []  -  0 Complex Wound Measurement - multiple wounds INTERVENTIONS - Wound Dressings X - Small Wound Dressing one or multiple wounds 1 10 []  - 0 Medium Wound Dressing one or multiple wounds []  - 0 Large Wound Dressing one or multiple wounds []  - 0 Application of Medications - topical []  - 0 Application of Medications - injection INTERVENTIONS - Miscellaneous []  - External ear exam 0 []  - 0 Specimen Collection (cultures, biopsies, blood, body fluids, etc.) []  - 0 Specimen(s) / Culture(s) sent or taken to Lab for analysis []  - 0 Patient Transfer (multiple staff / Lift / Similar devices) []  - 0 Simple Staple / Suture removal (25 or less) []  - 0 Complex Staple / Suture removal (26 or more) []  - 0 Hypo / Hyperglycemic Management (close monitor of Blood Glucose) []  - 0 Ankle / Brachial Index (ABI) - do not check if billed separately X- 1 5 Vital Signs Has the patient been seen at the hospital within the last three years: Yes Total Score: 70 Level Of Care: New/Established - Level 2 Electronic Signature(s) Signed: 10/01/2022 5:15:02 PM By: Entered By: on 10/01/2022 15:58:09 Poynor, Diasha A. ( ) -------------------------------------------------------------------------------- Encounter Discharge Information Details Patient Name: Iwanicki, Oleta A. Date of Service: 10/01/2022 3:00 PM Medical Record Number: Patient Account Number: Michiel Sites Date of Birth/Sex: 17-Oct-1966 (56 y.o. F) Treating RN: Primary Care Landen Knoedler: Other Clinician: 12/01/2022 Referring Kenan Moodie: Betha Loa Treating Helvi Royals/Extender: Betha Loa in Treatment: 24 Encounter Discharge Information Items Discharge Condition: Stable Ambulatory Status: Walker Discharge Destination:  Home Transportation: Private Auto Accompanied By: Hatchell Schedule Follow-up Appointment: Yes Clinical Summary of Care: Electronic Signature(s) Signed: 10/01/2022 5:15:02 PM By: 12/01/2022 Entered By: [id number] on 10/01/2022 16:00:03 Arguijo, Trenia A. (03/22/1966) -------------------------------------------------------------------------------- Lower Extremity Assessment Details Patient Name: Halbert, Sapphire A. Date of Service: 10/01/2022 3:00 PM Medical Record Number: Huel Coventry Patient Account Number: Orson Eva Date of Birth/Sex: 03-05-1966 (56 y.o. F) Treating RN: Rowan Blase Primary Care Theoren Palka: 25 Other Clinician: 12/01/2022 Referring Vilma Will: Betha Loa Treating Yu Peggs/Extender: Betha Loa Weeks in Treatment: 24 Electronic Signature(s) Signed: 10/01/2022 5:15:02 PM By: 12/01/2022 Signed: 10/02/2022 9:40:21 AM By: [id number], BSN, RN, CWS, Kim RN, BSN Entered By: 03/22/1966 on 10/01/2022 15:42:42 Kerney, Fujie A. (Huel Coventry) -------------------------------------------------------------------------------- Multi Wound Chart Details Patient Name: Seymore, Maikayla A. Date of Service: 10/01/2022 3:00 PM Medical Record Number: Betha Loa Patient Account Number: Orson Eva Date of Birth/Sex: January 26, 1966 (56 y.o. F) Treating RN: 12/01/2022 Primary Care Pascale Maves: Betha Loa Other Clinician: 12/02/2022 Referring Lindaann Gradilla: Elliot Gurney Treating Jorah Hua/Extender: Betha Loa Weeks in Treatment: 24 Vital Signs Height(in): 65 Pulse(bpm): 60 Weight(lbs): 220 Blood Pressure(mmHg): 105/70 Body Mass Index(BMI): 36.6 Temperature(F): 98.2 Respiratory Rate(breaths/min): 18 Photos: [N/A:N/A] Wound Location: Abdomen - midline N/A N/A Wounding Event: Surgical Injury N/A N/A Primary Etiology: Open Surgical Wound N/A N/A Comorbid History: Asthma, Arrhythmia N/A N/A Date Acquired: 03/25/2022 N/A N/A Weeks of Treatment: 24 N/A N/A Wound Status: Open  N/A N/A Wound Recurrence: No N/A N/A Measurements L x W x D (cm) 0.1x0.1x0.1 N/A N/A Area (cm) : 0.008 N/A N/A Volume (cm) : 0.001 N/A N/A % Reduction in Area: 100.00% N/A N/A % Reduction in Volume: 100.00% N/A N/A Classification: Full Thickness With Exposed N/A N/A Support Structures Exudate Amount: None Present N/A N/A Granulation Amount: None Present (0%) N/A N/A Necrotic Amount: None Present (0%) N/A N/A Exposed Structures: Fascia: No N/A N/A Fat Layer (Subcutaneous Tissue): No Tendon: No Muscle: No  Joint: No Bone: No Epithelialization: Large (67-100%) N/A N/A Treatment Notes Electronic Signature(s) Signed: 10/01/2022 5:15:02 PM By: Betha Loa Entered By: Betha Loa on 10/01/2022 15:43:17 Boulier, Marijke A. ([id number]) -------------------------------------------------------------------------------- Multi-Disciplinary Care Plan Details Patient Name: Gohr, Sibley A. Date of Service: 10/01/2022 3:00 PM Medical Record Number: [id number] Patient Account Number: [id number] Date of Birth/Sex: Nov 22, 1966 (56 y.o. F) Treating RN: Huel Coventry Primary Care Gustin Zobrist: Orson Eva Other Clinician: Betha Loa Referring Kiaan Overholser: Orson Eva Treating Joyia Riehle/Extender: Allen Derry Weeks in Treatment: 24 Active Inactive Electronic Signature(s) Signed: 10/01/2022 5:15:02 PM By: Betha Loa Signed: 10/02/2022 9:40:21 AM By: Elliot Gurney, BSN, RN, CWS, Kim RN, BSN Entered By: Betha Loa on 10/01/2022 15:42:47 Hulbert, Mckaela A. ([id number]) -------------------------------------------------------------------------------- Pain Assessment Details Patient Name: Hollingsworth, Ernestina A. Date of Service: 10/01/2022 3:00 PM Medical Record Number: [id number] Patient Account Number: [id number] Date of Birth/Sex: 1966-03-01 (56 y.o. F) Treating RN: Huel Coventry Primary Care Vonzella Althaus: Orson Eva Other Clinician: Betha Loa Referring Georgine Wiltse: Orson Eva Treating  Ramses Klecka/Extender: Allen Derry Weeks in Treatment: 24 Active Problems Location of Pain Severity and Description of Pain Patient Has Paino No Site Locations Pain Management and Medication Current Pain Management: Electronic Signature(s) Signed: 10/01/2022 5:15:02 PM By: Betha Loa Signed: 10/02/2022 9:40:21 AM By: Elliot Gurney, BSN, RN, CWS, Kim RN, BSN Entered By: Betha Loa on 10/01/2022 15:39:43 Meeuwsen, Young Berry ([id number]) -------------------------------------------------------------------------------- Patient/Caregiver Education Details Patient Name: Mendolia, Shaunita A. Date of Service: 10/01/2022 3:00 PM Medical Record Number: [id number] Patient Account Number: [id number] Date of Birth/Gender: [birth year] (56 y.o. F) Treating RN: Huel Coventry Primary Care Physician: Orson Eva Other Clinician: Betha Loa Referring Physician: Orson Eva Treating Physician/Extender: Rowan Blase in Treatment: 24 Education Assessment Education Provided To: Patient Education Topics Provided Wound/Skin Impairment: Handouts: Other: your wound is healed Please call if any further issues arise Methods: Explain/Verbal Responses: State content correctly Electronic Signature(s) Signed: 10/01/2022 5:15:02 PM By: Betha Loa Entered By: Betha Loa on 10/01/2022 15:59:26 Montrose, Rotha A. ([id number]) -------------------------------------------------------------------------------- Wound Assessment Details Patient Name: Hayner, Keleigh A. Date of Service: 10/01/2022 3:00 PM Medical Record Number: [id number] Patient Account Number: [id number] Date of Birth/Sex: 11/25/1966 (56 y.o. F) Treating RN: Huel Coventry Primary Care Cephus Tupy: Orson Eva Other Clinician: Betha Loa Referring Brenner Visconti: Orson Eva Treating Kristie Bracewell/Extender: Allen Derry Weeks in Treatment: 24 Wound Status Wound Number: 1 Primary Etiology: Open Surgical Wound Wound Location: Abdomen - midline Wound  Status: Healed - Epithelialized Wounding Event: Surgical Injury Comorbid History: Asthma, Arrhythmia Date Acquired: 03/25/2022 Weeks Of Treatment: 24 Clustered Wound: No Photos Wound Measurements Length: (cm) Width: (cm) Depth: (cm) Area: (cm) Volume: (cm) 0 % Reduction in Area: 100% 0 % Reduction in Volume: 100% 0 Epithelialization: Large (67-100%) 0 Tunneling: No 0 Undermining: No Wound Description Classification: Full Thickness With Exposed Support Structures Exudate Amount: None Present Foul Odor After Cleansing: No Slough/Fibrino No Wound Bed Granulation Amount: None Present (0%) Exposed Structure Necrotic Amount: None Present (0%) Fascia Exposed: No Fat Layer (Subcutaneous Tissue) Exposed: No Tendon Exposed: No Muscle Exposed: No Joint Exposed: No Bone Exposed: No Treatment Notes Wound #1 (Abdomen - midline) Cleanser Peri-Wound Care Topical Primary Dressing Dowdle, Mavery A. ([id number]) Secondary Dressing Secured With Compression Wrap Compression Stockings Add-Ons Electronic Signature(s) Signed: 10/01/2022 5:15:02 PM By: Betha Loa Signed: 10/02/2022 9:40:21 AM By: Elliot Gurney, BSN, RN, CWS, Kim RN, BSN Entered By: Betha Loa on 10/01/2022 15:55:57 Hinchman, Ludean A. ([id number]) -------------------------------------------------------------------------------- Vitals Details Patient Name: Albus, Sevannah A. Date of Service: 10/01/2022 3:00 PM Medical Record Number: [id number] Patient Account Number:  [id number] Date of Birth/Sex: 01-30-1966 (56 y.o. F) Treating RN: Cornell Barman Primary Care Dyshaun Bonzo: Evern Bio Other Clinician: Massie Kluver Referring Stoy Fenn: Evern Bio Treating Macky Galik/Extender: Jeri Cos Weeks in Treatment: 24 Vital Signs Time Taken: 15:37 Temperature (F): 98.2 Height (in): 65 Pulse (bpm): 60 Weight (lbs): 220 Respiratory Rate (breaths/min): 18 Body Mass Index (BMI): 36.6 Blood Pressure (mmHg): 105/70 Reference  Range: 80 - 120 mg / dl Electronic Signature(s) Signed: 10/01/2022 5:15:02 PM By: Massie Kluver Entered By: Massie Kluver on 10/01/2022 15:39:38

## 2022-10-08 ENCOUNTER — Other Ambulatory Visit: Payer: Medicaid Other

## 2022-10-17 ENCOUNTER — Other Ambulatory Visit: Payer: Self-pay | Admitting: Surgery

## 2022-10-17 DIAGNOSIS — K439 Ventral hernia without obstruction or gangrene: Secondary | ICD-10-CM

## 2022-10-29 ENCOUNTER — Other Ambulatory Visit: Payer: Medicaid Other

## 2022-11-07 ENCOUNTER — Ambulatory Visit
Admission: RE | Admit: 2022-11-07 | Discharge: 2022-11-07 | Disposition: A | Discharge status: Home / Self Care | Payer: Medicaid Other | Source: Ambulatory Visit | Attending: Surgery | Admitting: Surgery

## 2022-11-07 DIAGNOSIS — K439 Ventral hernia without obstruction or gangrene: Secondary | ICD-10-CM

## 2022-11-07 MED ORDER — IOPAMIDOL (ISOVUE-300) INJECTION 61%
100.0000 mL | Freq: Once | INTRAVENOUS | Status: AC | PRN
  Administered 2022-11-07: 100 mL via INTRAVENOUS

## 2023-03-13 ENCOUNTER — Encounter: Payer: Self-pay | Admitting: *Deleted

## 2023-03-17 ENCOUNTER — Ambulatory Visit: Payer: Medicaid Other | Admitting: Nurse Practitioner

## 2023-03-24 ENCOUNTER — Ambulatory Visit: Payer: Medicaid Other | Admitting: Nurse Practitioner

## 2023-04-07 ENCOUNTER — Ambulatory Visit (INDEPENDENT_AMBULATORY_CARE_PROVIDER_SITE_OTHER): Payer: Medicaid Other | Admitting: Nurse Practitioner

## 2023-04-07 VITALS — BP 108/68 | HR 76 | Ht 64.0 in | Wt 214.0 lb

## 2023-04-07 DIAGNOSIS — R7301 Impaired fasting glucose: Secondary | ICD-10-CM

## 2023-04-07 DIAGNOSIS — H9313 Tinnitus, bilateral: Secondary | ICD-10-CM | POA: Diagnosis not present

## 2023-04-07 DIAGNOSIS — E663 Overweight: Secondary | ICD-10-CM

## 2023-04-07 DIAGNOSIS — F32A Depression, unspecified: Secondary | ICD-10-CM

## 2023-04-07 DIAGNOSIS — R5383 Other fatigue: Secondary | ICD-10-CM | POA: Diagnosis not present

## 2023-04-07 MED ORDER — VORTIOXETINE HBR 20 MG PO TABS: 20.0000 mg | ORAL_TABLET | Freq: Every day | ORAL | 0 refills | Status: DC

## 2023-04-07 MED ORDER — CLOBETASOL PROPIONATE 0.05 % EX SOLN: Freq: Two times a day (BID) | CUTANEOUS | 5 refills | Status: DC

## 2023-04-07 MED ORDER — ONDANSETRON HCL 4 MG PO TABS: 4.0000 mg | ORAL_TABLET | Freq: Three times a day (TID) | ORAL | 1 refills | Status: DC | PRN

## 2023-04-07 MED ORDER — BETAMETHASONE DIPROPIONATE 0.05 % EX CREA
TOPICAL_CREAM | Freq: Every day | CUTANEOUS | 5 refills | Status: DC
Start: 2023-04-07 — End: 2023-08-21

## 2023-04-07 MED ORDER — ALBUTEROL SULFATE HFA 108 (90 BASE) MCG/ACT IN AERS: 2.0000 | INHALATION_SPRAY | Freq: Four times a day (QID) | RESPIRATORY_TRACT | 5 refills | Status: AC | PRN

## 2023-04-07 MED ORDER — METHOCARBAMOL 500 MG PO TABS: 500.0000 mg | ORAL_TABLET | Freq: Three times a day (TID) | ORAL | 5 refills | Status: DC

## 2023-04-07 MED ORDER — SPIRONOLACTONE 25 MG PO TABS: 25.0000 mg | ORAL_TABLET | Freq: Every day | ORAL | 11 refills | Status: DC

## 2023-04-07 NOTE — Progress Notes (Signed)
Established Patient Office Visit  Subjective:  Patient ID: Sara [name], female    DOB: December 14, 1966  Age: 57 y.o. MRN: [id number]  No chief complaint on file.   5 month follow up, no labs done.  Need refils, tinnitus and vertigo continues to be an issue so will re-refer to ENT.  Vertigo is more acute when patient bends at waist, when looking up or turns head to the side to look.      No other concerns at this time.   Past Medical History:  Diagnosis Date   ADHD    Contracture of left knee 01/25/2022   Depression    Dyspnea    Dysrhythmia    Heart murmur    Plantar fasciitis    bilateral feet   Thyroid disease     Past Surgical History:  Procedure Laterality Date   COLON SURGERY     COLONOSCOPY WITH PROPOFOL N/A 03/25/2022   Procedure: COLONOSCOPY WITH PROPOFOL;  Surgeon: Toney Reil, MD;  Location: [facility];  Service: Gastroenterology;  Laterality: N/A;   COLOSTOMY REVERSAL N/A 03/27/2022   Procedure: COLOSTOMY TAKEDOWN VENTRAL HERNIA REPAIR;  Surgeon: Quentin Ore, MD;  Location: [facility];  Service: General;  Laterality: N/A;   I & D EXTREMITY Left 11/06/2020   Procedure: IRRIGATION AND DEBRIDEMENT EXTREMITY;  Surgeon: Myrene Galas, MD;  Location: [facility] OR;  Service: Orthopedics;  Laterality: Left;   IR CATHETER TUBE CHANGE  11/15/2020   IRRIGATION AND DEBRIDEMENT KNEE  09/22/2020   Procedure: IRRIGATION AND DEBRIDEMENT LEFT KNEE PLACEMENT OF EXTERNAL FIXATION,;  Surgeon: Yolonda Kida, MD;  Location: [facility] OR;  Service: Orthopedics;;   LAPAROTOMY N/A 09/24/2020   Procedure: EXPLORATORY LAPAROTOMY COLOSTOMY  AND REPAIR  FLANK HERNIA;  Surgeon: Berna Bue, MD;  Location: [facility] OR;  Service: General;  Laterality: N/A;   LAPAROTOMY N/A 09/22/2020   Procedure: EXPLORATORY LAPAROTOMY, Ileocecectomy;  Surgeon: Berna Bue, MD;  Location: [facility] OR;  Service: General;  Laterality: N/A;   OPEN REDUCTION INTERNAL FIXATION (ORIF) DISTAL RADIAL FRACTURE  Right 09/26/2020   Procedure: OPEN REDUCTION INTERNAL FIXATION (ORIF) DISTAL RADIUS FRACTURE;  Surgeon: Myrene Galas, MD;  Location: [facility] OR;  Service: Orthopedics;  Laterality: Right;   ORIF FEMUR FRACTURE Left 11/02/2020   Procedure: INCISION DRAINAGE DEEP WOUND LEFT LEG, APPLICATION OF WOUND VAC;  Surgeon: Myrene Galas, MD;  Location: [facility] OR;  Service: Orthopedics;  Laterality: Left;   ORIF FEMUR FRACTURE Left 12/07/2020   Procedure: OPEN REDUCTION INTERNAL FIXATION (ORIF) DISTAL FEMUR FRACTURE : REPAIR OF LEFT DISTAL FEMUR NONUNION;  Surgeon: Myrene Galas, MD;  Location: [facility] OR;  Service: Orthopedics;  Laterality: Left;   ORIF FEMUR FRACTURE Left 01/24/2022   Procedure: REPAIR LEFT FEMUR NONUNION, QUADRECEPLASTY;  Surgeon: Myrene Galas, MD;  Location: [facility] OR;  Service: Orthopedics;  Laterality: Left;   ORIF TIBIA PLATEAU Bilateral 09/26/2020   Procedure: OPEN REDUCTION INTERNAL FIXATION (ORIF) RIGHT DISTAL FEMUR, RIGHT CALCANEUS, LEFT DISTAL FEMUR, LEFT TIBIAL PLATEAU FRACTURE.  IRRIGATION AND DEBRIDEMENT LEFT LEG; REMOVAL OF EXTERNAL FIXATOR LEFT LEG;  Surgeon: Myrene Galas, MD;  Location: [facility] OR;  Service: Orthopedics;  Laterality: Bilateral;    Social History   Socioeconomic History   Marital status: Single    Spouse name: Not on file   Number of children: Not on file   Years of education: Not on file   Highest education level: Not on file  Occupational History   Not on file  Tobacco Use  Smoking status: Former    Types: Cigarettes   Smokeless tobacco: Former  Building services engineer Use: Some days   Substances: CBD  Substance and Sexual Activity   Alcohol use: Yes    Comment: occas.   Drug use: No   Sexual activity: Not Currently    Birth control/protection: I.U.D.  Other Topics Concern   Not on file  Social History Narrative   ** Merged History Encounter **       Social Determinants of Health   Financial Resource Strain: Not on file  Food Insecurity: Not on file   Transportation Needs: Not on file  Physical Activity: Not on file  Stress: Not on file  Social Connections: Not on file  Intimate Partner Violence: Not on file    Family History  Problem Relation Age of Onset   Breast cancer Maternal Aunt     Allergies  Allergen Reactions   Azithromycin Diarrhea and Nausea And Vomiting   Benzalkonium Chloride Other (See Comments)    Pt unsure of allergy    Gabapentin Other (See Comments)    Patient states it "makes her loopy"   Augmentin [Amoxicillin-Pot Clavulanate] Nausea Only   Ciprofloxacin Nausea Only   Neomycin Rash   Neosporin [Bacitracin-Polymyxin B] Itching and Rash   Neosporin [Neomycin-Bacitracin Zn-Polymyx] Rash   Septra [Sulfamethoxazole-Trimethoprim] Nausea Only    Review of Systems  Constitutional: Negative.   HENT:  Positive for tinnitus.   Eyes: Negative.   Respiratory: Negative.    Cardiovascular: Negative.   Gastrointestinal: Negative.   Genitourinary: Negative.   Musculoskeletal: Negative.   Skin: Negative.   Neurological: Negative.   Endo/Heme/Allergies: Negative.   Psychiatric/Behavioral: Negative.         Objective:   There were no vitals taken for this visit.  There were no vitals filed for this visit.  Physical Exam Vitals reviewed.  Constitutional:      Appearance: Normal appearance.  HENT:     Head: Normocephalic.     Nose: Nose normal.     Mouth/Throat:     Mouth: Mucous membranes are moist.  Eyes:     Pupils: Pupils are equal, round, and reactive to light.  Cardiovascular:     Rate and Rhythm: Normal rate and regular rhythm.  Pulmonary:     Effort: Pulmonary effort is normal.     Breath sounds: Normal breath sounds.  Abdominal:     General: Bowel sounds are normal.     Palpations: Abdomen is soft.  Musculoskeletal:     Cervical back: Normal range of motion and neck supple.  Skin:    General: Skin is warm and dry.  Neurological:     Mental Status: She is alert and oriented to  person, place, and time.  Psychiatric:        Mood and Affect: Mood normal.        Behavior: Behavior normal.      No results found for any visits on 04/07/23.      Assessment & Plan:   Problem List Items Addressed This Visit   None   No follow-ups on file.   Total time spent: 35 minutes  Orson Eva, NP  04/07/2023

## 2023-04-07 NOTE — Patient Instructions (Addendum)
1) ENT referral 2) Fasting labs today 3) 6 month follow up fasting labs prior

## 2023-04-08 LAB — CMP14+EGFR
ALT: 20 IU/L (ref 0–32)
AST: 19 IU/L (ref 0–40)
Albumin/Globulin Ratio: 1.6 (ref 1.2–2.2)
Albumin: 4.2 g/dL (ref 3.8–4.9)
Alkaline Phosphatase: 78 IU/L (ref 44–121)
BUN/Creatinine Ratio: 11 (ref 9–23)
BUN: 11 mg/dL (ref 6–24)
Bilirubin Total: 0.4 mg/dL (ref 0.0–1.2)
CO2: 23 mmol/L (ref 20–29)
Calcium: 9.2 mg/dL (ref 8.7–10.2)
Chloride: 103 mmol/L (ref 96–106)
Creatinine, Ser: 1.01 mg/dL — ABNORMAL HIGH (ref 0.57–1.00)
Globulin, Total: 2.6 g/dL (ref 1.5–4.5)
Glucose: 79 mg/dL (ref 70–99)
Potassium: 3.7 mmol/L (ref 3.5–5.2)
Sodium: 143 mmol/L (ref 134–144)
Total Protein: 6.8 g/dL (ref 6.0–8.5)
eGFR: 66 mL/min/{1.73_m2} (ref >59)

## 2023-04-08 LAB — LIPID PANEL
Chol/HDL Ratio: 2.3 ratio (ref 0.0–4.4)
Cholesterol, Total: 169 mg/dL (ref 100–199)
HDL: 72 mg/dL (ref >39)
LDL Chol Calc (NIH): 82 mg/dL (ref 0–99)
Triglycerides: 80 mg/dL (ref 0–149)
VLDL Cholesterol Cal: 15 mg/dL (ref 5–40)

## 2023-04-08 LAB — HEMOGLOBIN A1C
Est. average glucose Bld gHb Est-mCnc: 114 mg/dL
Hgb A1c MFr Bld: 5.6 % (ref 4.8–5.6)

## 2023-04-08 LAB — TSH: TSH: 1.62 u[IU]/mL (ref 0.450–4.500)

## 2023-05-20 IMAGING — CT CT KNEE*L* W/O CM
1 series · 12 of 14 positions shown, 15 images · non-contrast
Comparison: Left knee and femur radiographs 01/04/2021

CLINICAL DATA: Evaluate left knee supracondylar fracture and distal
left femur fracture, multiple surgeries

EXAM:
CT OF THE LEFT KNEE WITHOUT CONTRAST
TECHNIQUE: Multidetector CT imaging of the LEFT knee was performed according to
the standard protocol. Multiplanar CT image reconstructions were
also generated.

[Series 4: lower ext 1.5 st · axial · 0.42mm/px · z∈[-390,-58]mm · 12 of 263 slices shown, 15 images]
[im 21/263  soft-tissue]
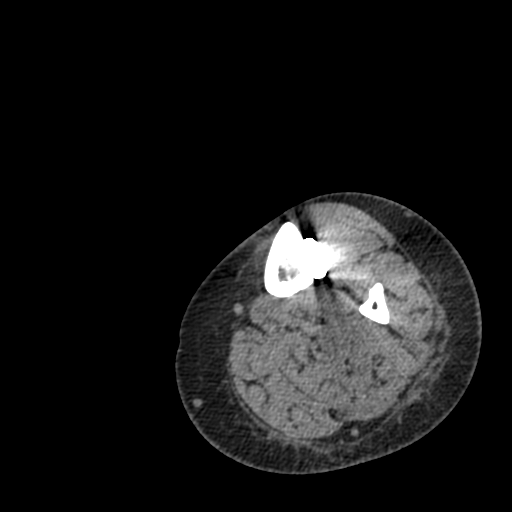
[im 21/263  bone]
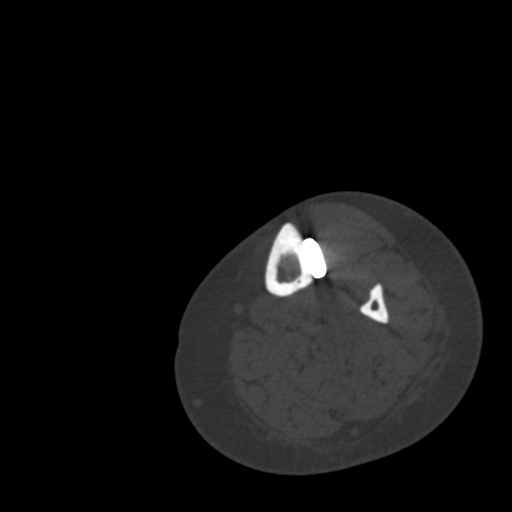
[im 41/263  bone]
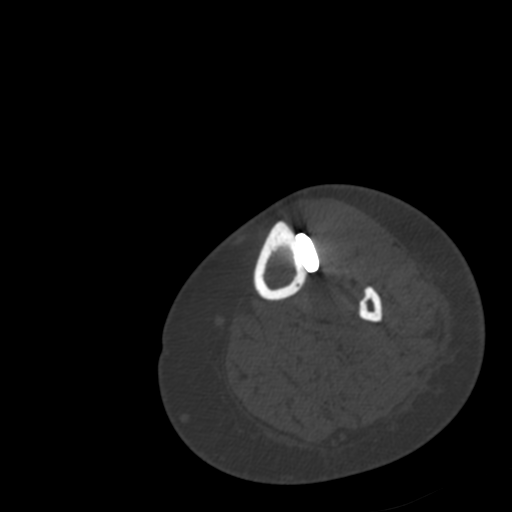
[im 61/263  bone]
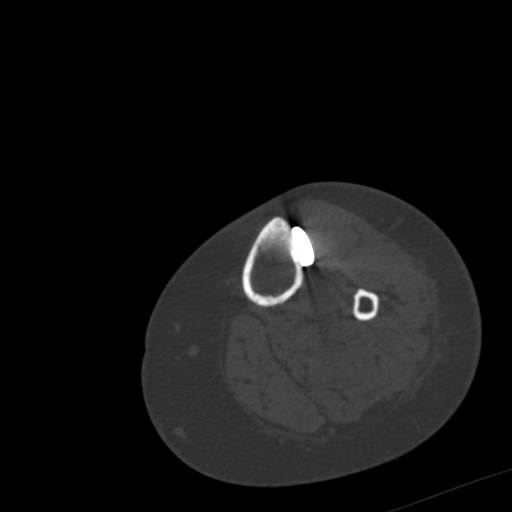
[im 81/263  bone]
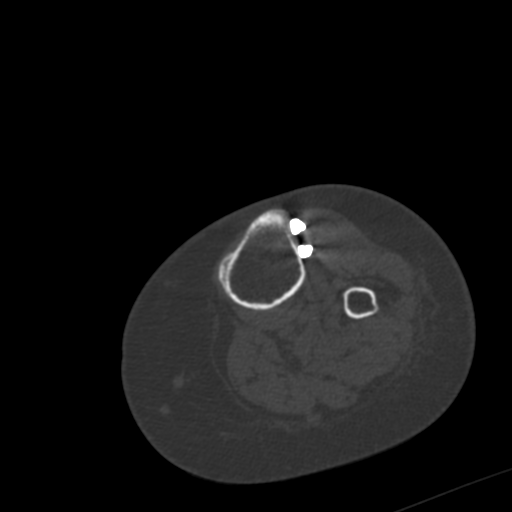
[im 101/263  soft-tissue]
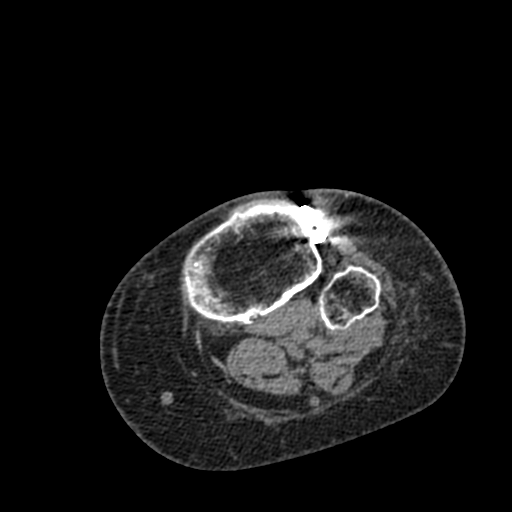
[im 101/263  bone]
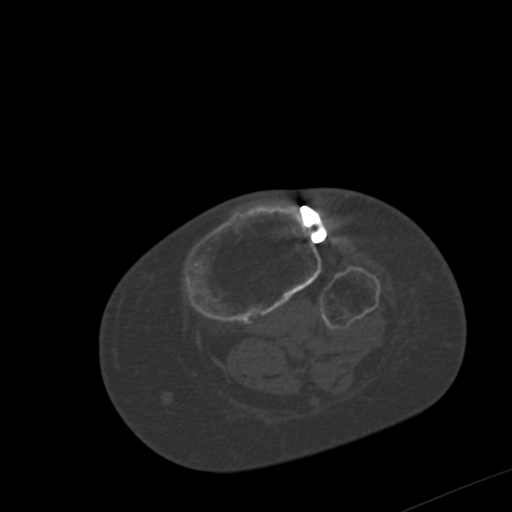
[im 121/263  bone]
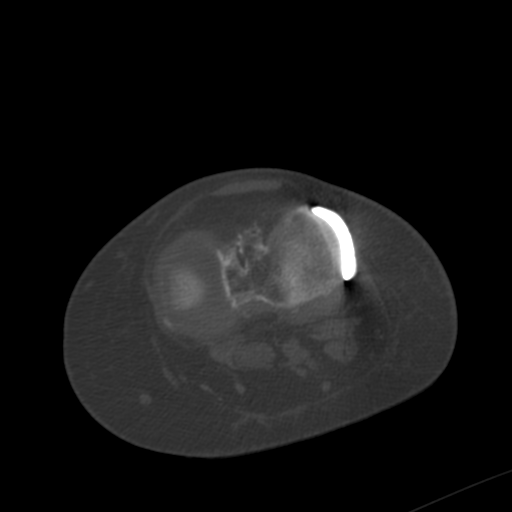
[im 142/263  bone]
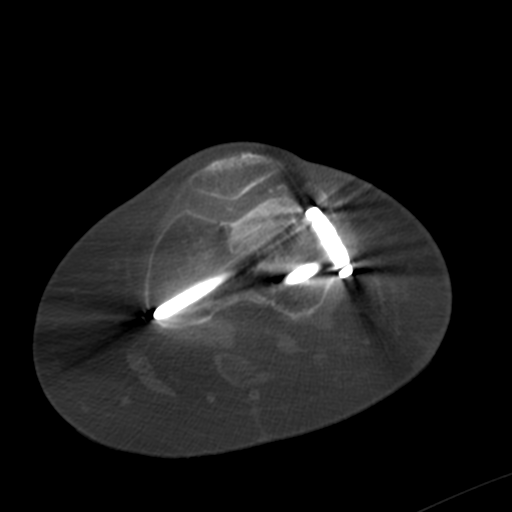
[im 162/263  bone]
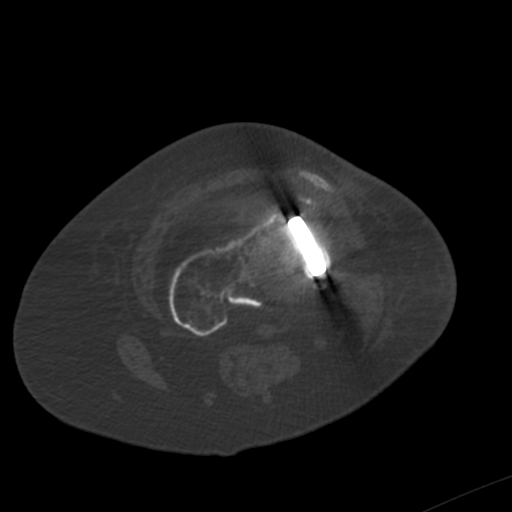
[im 182/263  soft-tissue]
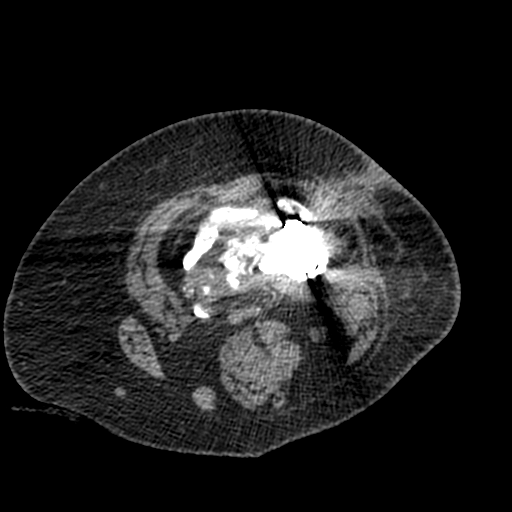
[im 182/263  bone]
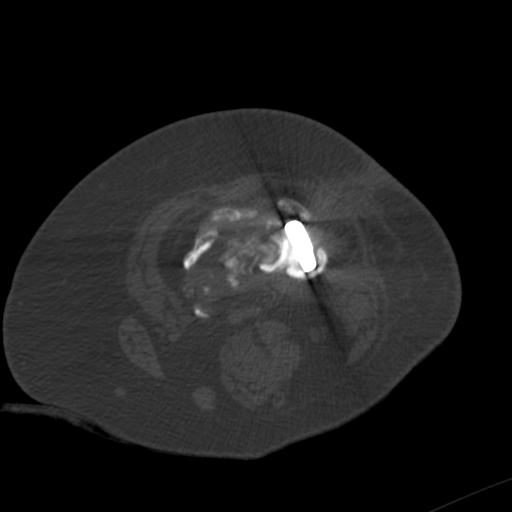
[im 202/263  bone]
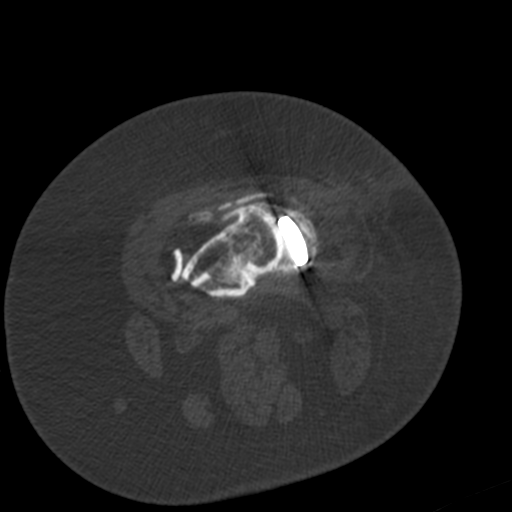
[im 222/263  bone]
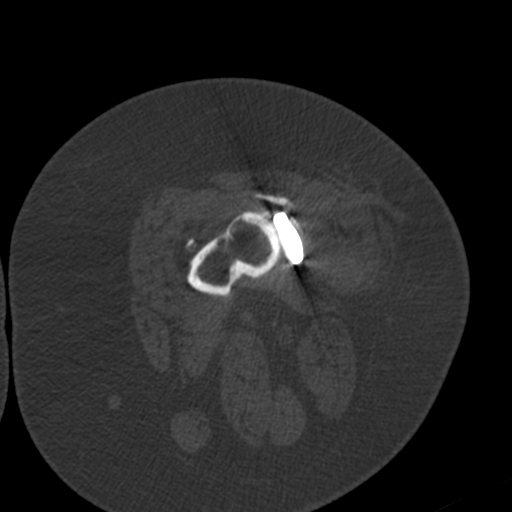
[im 242/263  bone]
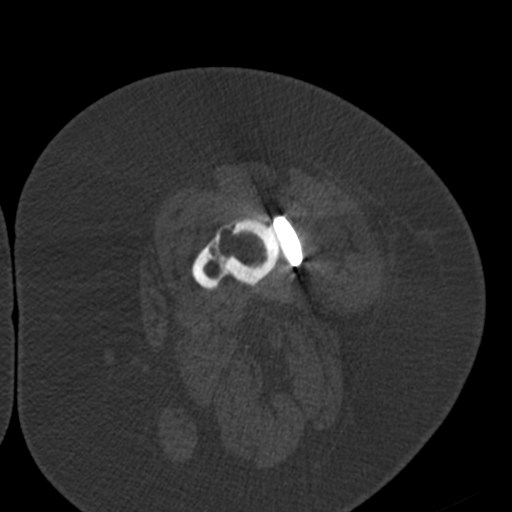

[12 of 14 positions shown; findings below may reference images not displayed]

FINDINGS: Bones/Joint/Cartilage

There is a highly comminuted distal femur fracture with lateral
plate/screw fixation hardware. Hardware is intact without evidence
of loosening. There are multiple screws traversing through the
intercondylar notch, unchanged. There is graft material between the
major fracture segments, with incomplete incorporation and sclerotic
fracture margins. There is non-fusion across the supracondylar
fracture separating the proximal segment (femoral diaphysis) and the
distal segment (femoral metaphysis/femoral condyles). The femoral
diaphyseal fracture fragments of the proximal segment are fused.
With regards to the distal fracture segment, there is a persistent
fracture line with sclerotic margins coursing into the intercondylar
notch and there is unchanged anterior angulation of the distal
segment.

Lateral plate fixation for a bicondylar proximal tibia fracture.
There is incomplete healing with some solid bony bridging and some
residual visible fracture lines. The tibial fixation hardware is
intact without evidence of loosening.

There are multiple small tricompartment intra-articular joint bodies
of the knee. There is a small knee joint effusion. There is mild
posttraumatic tricompartment osteoarthritis.

Ligaments

Suboptimally assessed by CT.

Muscles and Tendons

No significant muscle atrophy.  No intramuscular collection.

Soft tissues

Chronic postsurgical soft tissue changes. No focal fluid collection.
IMPRESSION: Comminuted distal femur fracture post lateral plate/screw fixation.
Intact hardware without evidence of loosening. Graft material is
present between the major fracture segments, with incomplete
incorporation, sclerotic fracture margins, and no significant bony
bridging between the femoral diaphysis and the metaphysis.
Persistent fracture line with sclerotic margins coursing through the
intercondylar notch.

Incomplete healing of the proximal tibial fracture with some bony
bridging and some persistent fracture lines. Intact tibial fixation
hardware without evidence of loosening.

Mild posttraumatic tricompartment osteoarthritis of the knee, with
small knee joint effusion and multiple small tricompartment
articular joint bodies the knee.

## 2023-06-10 ENCOUNTER — Ambulatory Visit: Payer: Medicaid Other | Admitting: Podiatry

## 2023-06-10 DIAGNOSIS — L600 Ingrowing nail: Secondary | ICD-10-CM

## 2023-06-10 NOTE — Progress Notes (Signed)
Subjective:  Patient ID: Sara [name], female    DOB: 09/28/1966,  MRN: [id number]  Chief Complaint  Patient presents with   Nail Problem    57 y.o. female presents with the above complaint.  Patient presents with bilateral lateral border ingrown.  Patient states pain for touch is progressive getting worse worse with ambulation worse with pressure she states that hurts with ambulation.  She does not want to have it removed she would like to know if she can be just schedule an slant back procedure.  Denies any other acute complaints   Review of Systems: Negative except as noted in the HPI. Denies N/V/F/Ch.  Past Medical History:  Diagnosis Date   ADHD    Contracture of left knee 01/25/2022   Depression    Dyspnea    Dysrhythmia    Heart murmur    Plantar fasciitis    bilateral feet   Thyroid disease     Current Outpatient Medications:    acetaminophen (TYLENOL) 500 MG tablet, Take 1 tablet (500 mg total) by mouth every 8 (eight) hours as needed for moderate pain or mild pain., Disp: 30 tablet, Rfl: 0   albuterol (VENTOLIN HFA) 108 (90 Base) MCG/ACT inhaler, Inhale 2 puffs into the lungs every 6 (six) hours as needed for wheezing or shortness of breath., Disp: 18 g, Rfl: 5   betamethasone dipropionate 0.05 % cream, Apply topically daily., Disp: 30 g, Rfl: 5   clobetasol (TEMOVATE) 0.05 % external solution, Apply topically 2 (two) times daily., Disp: 50 mL, Rfl: 5   methocarbamol (ROBAXIN) 500 MG tablet, Take 1 tablet (500 mg total) by mouth 3 (three) times daily., Disp: 90 tablet, Rfl: 5   ondansetron (ZOFRAN) 4 MG tablet, Take 1 tablet (4 mg total) by mouth every 8 (eight) hours as needed for nausea or vomiting., Disp: 30 tablet, Rfl: 1   potassium chloride SA (KLOR-CON M) 20 MEQ tablet, Take 2 tablets (40 mEq total) by mouth daily for 7 days. (Patient not taking: Reported on 04/07/2023), Disp: 14 tablet, Rfl: 0   spironolactone (ALDACTONE) 25 MG tablet, Take 1 tablet (25 mg total)  by mouth daily., Disp: 30 tablet, Rfl: 11   Triamcinolone Acetonide (TRIAMCINOLONE 0.1 % CREAM : EUCERIN) CREA, , Disp: , Rfl:    vortioxetine HBr (TRINTELLIX) 20 MG TABS tablet, Take 1 tablet (20 mg total) by mouth daily., Disp: 30 tablet, Rfl: 0  Social History   Tobacco Use  Smoking Status Former   Types: Cigarettes  Smokeless Tobacco Former    Allergies  Allergen Reactions   Azithromycin Diarrhea and Nausea And Vomiting   Benzalkonium Chloride Other (See Comments)    Pt unsure of allergy    Gabapentin Other (See Comments)    Patient states it "makes her loopy"   Augmentin [Amoxicillin-Pot Clavulanate] Nausea Only   Ciprofloxacin Nausea Only   Neomycin Rash   Neosporin [Bacitracin-Polymyxin B] Itching and Rash   Neosporin [Neomycin-Bacitracin Zn-Polymyx] Rash   Septra [Sulfamethoxazole-Trimethoprim] Nausea Only   Objective:  There were no vitals filed for this visit. There is no height or weight on file to calculate BMI. Constitutional Well developed. Well nourished.  Vascular Dorsalis pedis pulses palpable bilaterally. Posterior tibial pulses palpable bilaterally. Capillary refill normal to all digits.  No cyanosis or clubbing noted. Pedal hair growth normal.  Neurologic Normal speech. Oriented to person, place, and time. Epicritic sensation to light touch grossly present bilaterally.  Dermatologic Nails bilateral ingrown toenails noted to lateral border pain  on palpation no clinical signs of infection Skin within normal limits  Orthopedic: Normal joint ROM without pain or crepitus bilaterally. No visible deformities. No bony tenderness.   Radiographs: None Assessment:   1. Ingrown left big toenail   2. Ingrown toenail of right foot    Plan:  Patient was evaluated and treated and all questions answered.  Bilateral hallux ingrown -All questions and concerns were discussed with the patient in extensive detail.  At this time we will hold off on taking the  entire coronary nail off for now patient would benefit from slant back procedure.  Using nail nipper the slant back procedure was carried out in standard technique no complication noted no bleeding noted.  No follow-ups on file.

## 2023-06-12 ENCOUNTER — Other Ambulatory Visit: Payer: Self-pay | Admitting: Orthopedic Surgery

## 2023-06-12 DIAGNOSIS — S72461K Displaced supracondylar fracture with intracondylar extension of lower end of right femur, subsequent encounter for closed fracture with nonunion: Secondary | ICD-10-CM

## 2023-06-17 ENCOUNTER — Ambulatory Visit
Admission: RE | Admit: 2023-06-17 | Discharge: 2023-06-17 | Disposition: A | Discharge status: Home / Self Care | Payer: Medicaid Other | Source: Ambulatory Visit | Attending: Orthopedic Surgery | Admitting: Orthopedic Surgery

## 2023-06-17 DIAGNOSIS — S72461K Displaced supracondylar fracture with intracondylar extension of lower end of right femur, subsequent encounter for closed fracture with nonunion: Secondary | ICD-10-CM

## 2023-06-17 NOTE — Addendum Note (Signed)
Encounter addended by: Dorthea Cove on: 06/17/2023 3:41 PM  Actions taken: Imaging Exam ended

## 2023-06-27 ENCOUNTER — Other Ambulatory Visit: Payer: Self-pay

## 2023-06-27 ENCOUNTER — Encounter: Payer: Self-pay | Admitting: Neurology

## 2023-06-27 DIAGNOSIS — R202 Paresthesia of skin: Secondary | ICD-10-CM

## 2023-06-30 ENCOUNTER — Other Ambulatory Visit: Payer: Self-pay | Admitting: Nurse Practitioner

## 2023-07-07 ENCOUNTER — Ambulatory Visit (INDEPENDENT_AMBULATORY_CARE_PROVIDER_SITE_OTHER): Payer: Medicaid Other | Admitting: Neurology

## 2023-07-07 DIAGNOSIS — G5621 Lesion of ulnar nerve, right upper limb: Secondary | ICD-10-CM

## 2023-07-07 DIAGNOSIS — R202 Paresthesia of skin: Secondary | ICD-10-CM

## 2023-07-07 NOTE — Procedures (Signed)
[facility] Neurology  [address] [place], [address]  [place], [place] [address] Tel: [phone] Fax: [phone] Test Date:  07/07/2023  Patient: Sara [name] DOB: 1966/07/16 Physician: Jacquelyne Balint, MD  Sex: Female Height: 5\' 4"  Ref Phys: Myrene Galas, MD  ID#: [id number]   Technician:    History: This is a 57 year old female with numbness of the right upper limb.  NCV & EMG Findings: Extensive electrodiagnostic evaluation of the right upper limb with additional nerve conduction studies of the left upper limb shows: Right ulnar sensory response is absent. Left ulnar, right median, and right radial sensory responses are within normal limits. Right ulnar (ADM) motor response shows no response below or above the elbow, but response is within normal limits at the wrist. Left ulnar (ADM) and right median (APB) motor responses are within normal limits. Right ulnar (ADM) F wave is absent. Chronic motor axon loss changes without accompanying active denervation changes are seen in the right first dorsal interosseous and abductor digiti minimi muscles. All other tested muscles are within normal limits with normal motor unit configurations and recruitment patterns.  Impression: This is an abnormal study. The findings are most consistent with the following: Evidence of a right ulnar mononeuropathy, axon loss in type, with lesion localized proximal to the wrist but distal to the take off to the flexor carpal ulnaris and flexor digitorum profundus to digits 4,5 muscles. This is an atypical area for compression. As such, neuromuscular ultrasound may be helpful for further localization. No electrodiagnostic evidence of a right cervical (C5-C8) motor radiculopathy. No electrodiagnostic evidence of a right median mononeuropathy at or distal to the wrist (ie: carpal tunnel syndrome).    ___________________________ Jacquelyne Balint, MD    Nerve Conduction Studies Motor Nerve Results    Latency  Amplitude F-Lat Segment Distance CV Comment  Site (ms) Norm (mV) Norm (ms)  (cm) (m/s) Norm   Right Median (APB) Motor  Wrist 2.3  < 4.0 7.3  > 6.0        Elbow 6.4 - 6.3 -  Elbow-Wrist 24.5 60  > 50   Left Ulnar (ADM) Motor  Wrist 1.55  < 3.1 9.8  > 7.0        Bel elbow 4.5 - 9.1 -  Bel elbow-Wrist 19 63  > 50   Ab elbow 6.1 - 8.5 -  Ab elbow-Bel elbow 10 63 -   Right Ulnar (ADM) Motor  Wrist 2.0  < 3.1 8.2  > 7.0 *NR       Bel elbow *NR - *NR -  Bel elbow-Wrist - *NR  > 50   Ab elbow *NR - *NR -  Ab elbow-Bel elbow - *NR -    Sensory Sites    Neg Peak Lat Amplitude (O-P) Segment Distance Velocity Comment  Site (ms) Norm (V) Norm  (cm) (ms)   Right Median Sensory  Wrist-Dig II 2.6  < 3.6 47  > 15 Wrist-Dig II 13    Right Radial Sensory  Forearm-Wrist 1.98  < 2.7 47  > 14 Forearm-Wrist 10    Left Ulnar Sensory  Wrist-Dig V 2.4  < 3.1 53  > 10 Wrist-Dig V 11    Right Ulnar Sensory  Wrist-Dig V 2.8  < 3.1 *6  > 10 Wrist-Dig V 11     Electromyography   Side Muscle Ins.Act Fibs Fasc Recrt Amp Dur Poly Activation Comment  Right FDI Nml Nml Nml *2- *1+ *1+ *1+ Nml N/A  Right  EIP Nml Nml Nml Nml Nml Nml Nml Nml N/A  Right ADM Nml Nml Nml *3- *1+ *1+ *1+ Nml N/A  Right Pronator teres Nml Nml Nml Nml Nml Nml Nml Nml N/A  Right FCU Nml Nml Nml Nml Nml Nml Nml Nml N/A  Right FDP Nml Nml Nml Nml Nml Nml Nml Nml N/A  Right Biceps Nml Nml Nml Nml Nml Nml Nml Nml N/A  Right Triceps Nml Nml Nml Nml Nml Nml Nml Nml N/A  Right Deltoid Nml Nml Nml Nml Nml Nml Nml Nml N/A      Waveforms:  Motor        Sensory           F-Wave

## 2023-07-15 ENCOUNTER — Encounter: Payer: Self-pay | Admitting: Neurology

## 2023-08-14 ENCOUNTER — Other Ambulatory Visit: Payer: Self-pay

## 2023-08-14 MED ORDER — VORTIOXETINE HBR 20 MG PO TABS: 20.0000 mg | ORAL_TABLET | Freq: Every day | ORAL | 0 refills | Status: DC

## 2023-08-21 ENCOUNTER — Ambulatory Visit: Payer: Medicaid Other | Admitting: Cardiology

## 2023-08-21 ENCOUNTER — Encounter: Payer: Self-pay | Admitting: Cardiology

## 2023-08-21 VITALS — BP 130/70 | HR 85 | Ht 64.0 in | Wt 220.0 lb

## 2023-08-21 DIAGNOSIS — B372 Candidiasis of skin and nail: Secondary | ICD-10-CM | POA: Diagnosis not present

## 2023-08-21 MED ORDER — NYSTATIN 100000 UNIT/GM EX CREA: TOPICAL_CREAM | Freq: Two times a day (BID) | CUTANEOUS | 4 refills | Status: DC

## 2023-08-21 NOTE — Progress Notes (Signed)
Established Patient Office Visit  Subjective:  Patient ID: Sara [name], female    DOB: 05-18-1966  Age: 57 y.o. MRN: [id number]  Chief Complaint  Patient presents with   Follow-up    Rash under breast    Patient in office complaining of rash under both breasts, both groin, and right hip area. Started about a weak ago. Has used hydrocortisone cream with no change. Rash appears red, shiny. Patient reports rash itches and is tender. Will send in nystatin cream to be used twice daily. Keep areas clean and dry.     No other concerns at this time.   Past Medical History:  Diagnosis Date   ADHD    Contracture of left knee 01/25/2022   Depression    Dyspnea    Dysrhythmia    Heart murmur    Plantar fasciitis    bilateral feet   Thyroid disease     Past Surgical History:  Procedure Laterality Date   COLON SURGERY     COLONOSCOPY WITH PROPOFOL N/A 03/25/2022   Procedure: COLONOSCOPY WITH PROPOFOL;  Surgeon: Toney Reil, MD;  Location: [facility] ENDOSCOPY;  Service: Gastroenterology;  Laterality: N/A;   COLOSTOMY REVERSAL N/A 03/27/2022   Procedure: COLOSTOMY TAKEDOWN VENTRAL HERNIA REPAIR;  Surgeon: Quentin Ore, MD;  Location: [facility] ORS;  Service: General;  Laterality: N/A;   I & D EXTREMITY Left 11/06/2020   Procedure: IRRIGATION AND DEBRIDEMENT EXTREMITY;  Surgeon: Myrene Galas, MD;  Location: [facility] OR;  Service: Orthopedics;  Laterality: Left;   IR CATHETER TUBE CHANGE  11/15/2020   IRRIGATION AND DEBRIDEMENT KNEE  09/22/2020   Procedure: IRRIGATION AND DEBRIDEMENT LEFT KNEE PLACEMENT OF EXTERNAL FIXATION,;  Surgeon: Yolonda Kida, MD;  Location: [facility] OR;  Service: Orthopedics;;   LAPAROTOMY N/A 09/24/2020   Procedure: EXPLORATORY LAPAROTOMY COLOSTOMY  AND REPAIR  FLANK HERNIA;  Surgeon: Berna Bue, MD;  Location: [facility] OR;  Service: General;  Laterality: N/A;   LAPAROTOMY N/A 09/22/2020   Procedure: EXPLORATORY LAPAROTOMY, Ileocecectomy;  Surgeon: Berna Bue, MD;  Location: [facility] OR;  Service: General;  Laterality: N/A;   OPEN REDUCTION INTERNAL FIXATION (ORIF) DISTAL RADIAL FRACTURE Right 09/26/2020   Procedure: OPEN REDUCTION INTERNAL FIXATION (ORIF) DISTAL RADIUS FRACTURE;  Surgeon: Myrene Galas, MD;  Location: [facility] OR;  Service: Orthopedics;  Laterality: Right;   ORIF FEMUR FRACTURE Left 11/02/2020   Procedure: INCISION DRAINAGE DEEP WOUND LEFT LEG, APPLICATION OF WOUND VAC;  Surgeon: Myrene Galas, MD;  Location: [facility] OR;  Service: Orthopedics;  Laterality: Left;   ORIF FEMUR FRACTURE Left 12/07/2020   Procedure: OPEN REDUCTION INTERNAL FIXATION (ORIF) DISTAL FEMUR FRACTURE : REPAIR OF LEFT DISTAL FEMUR NONUNION;  Surgeon: Myrene Galas, MD;  Location: [facility] OR;  Service: Orthopedics;  Laterality: Left;   ORIF FEMUR FRACTURE Left 01/24/2022   Procedure: REPAIR LEFT FEMUR NONUNION, QUADRECEPLASTY;  Surgeon: Myrene Galas, MD;  Location: [facility] OR;  Service: Orthopedics;  Laterality: Left;   ORIF TIBIA PLATEAU Bilateral 09/26/2020   Procedure: OPEN REDUCTION INTERNAL FIXATION (ORIF) RIGHT DISTAL FEMUR, RIGHT CALCANEUS, LEFT DISTAL FEMUR, LEFT TIBIAL PLATEAU FRACTURE.  IRRIGATION AND DEBRIDEMENT LEFT LEG; REMOVAL OF EXTERNAL FIXATOR LEFT LEG;  Surgeon: Myrene Galas, MD;  Location: [facility] OR;  Service: Orthopedics;  Laterality: Bilateral;    Social History   Socioeconomic History   Marital status: Single    Spouse name: Not on file   Number of children: Not on file   Years of education: Not on file  Highest education level: Not on file  Occupational History   Not on file  Tobacco Use   Smoking status: Former    Types: Cigarettes   Smokeless tobacco: Former  Building services engineer status: Some Days   Substances: CBD  Substance and Sexual Activity   Alcohol use: Yes    Comment: occas.   Drug use: No   Sexual activity: Not Currently    Birth control/protection: I.U.D.  Other Topics Concern   Not on file  Social History Narrative   ** Merged  History Encounter **       Social Determinants of Health   Financial Resource Strain: Not on file  Food Insecurity: Not on file  Transportation Needs: Not on file  Physical Activity: Not on file  Stress: Not on file  Social Connections: Not on file  Intimate Partner Violence: Not on file    Family History  Problem Relation Age of Onset   Breast cancer Maternal Aunt     Allergies  Allergen Reactions   Azithromycin Diarrhea and Nausea And Vomiting   Benzalkonium Chloride Other (See Comments)    Pt unsure of allergy    Gabapentin Other (See Comments)    Patient states it "makes her loopy"   Augmentin [Amoxicillin-Pot Clavulanate] Nausea Only   Ciprofloxacin Nausea Only   Neomycin Rash   Neosporin [Bacitracin-Polymyxin B] Itching and Rash   Neosporin [Neomycin-Bacitracin Zn-Polymyx] Rash   Septra [Sulfamethoxazole-Trimethoprim] Nausea Only    Review of Systems  Constitutional: Negative.   HENT: Negative.    Eyes: Negative.   Respiratory: Negative.  Negative for shortness of breath.   Cardiovascular: Negative.  Negative for chest pain.  Gastrointestinal: Negative.  Negative for abdominal pain, constipation and diarrhea.  Genitourinary: Negative.   Musculoskeletal:  Negative for joint pain and myalgias.  Skin:  Positive for itching and rash.  Neurological: Negative.  Negative for dizziness and headaches.  Endo/Heme/Allergies: Negative.   All other systems reviewed and are negative.      Objective:   BP 130/70   Pulse 85   Ht 5\' 4"  (1.626 m)   Wt 220 lb (99.8 kg)   SpO2 95%   BMI 37.76 kg/m   Vitals:   08/21/23 1415  BP: 130/70  Pulse: 85  Height: 5\' 4"  (1.626 m)  Weight: 220 lb (99.8 kg)  SpO2: 95%  BMI (Calculated): 37.74    Physical Exam Vitals and nursing note reviewed.  Constitutional:      Appearance: Normal appearance. She is normal weight.  HENT:     Head: Normocephalic and atraumatic.     Nose: Nose normal.     Mouth/Throat:     Mouth:  Mucous membranes are moist.  Eyes:     Extraocular Movements: Extraocular movements intact.     Conjunctiva/sclera: Conjunctivae normal.     Pupils: Pupils are equal, round, and reactive to light.  Cardiovascular:     Rate and Rhythm: Normal rate and regular rhythm.     Pulses: Normal pulses.     Heart sounds: Normal heart sounds.  Pulmonary:     Effort: Pulmonary effort is normal.     Breath sounds: Normal breath sounds.  Abdominal:     General: Abdomen is flat. Bowel sounds are normal.     Palpations: Abdomen is soft.  Musculoskeletal:        General: Normal range of motion.     Cervical back: Normal range of motion.  Skin:    General: Skin  is warm and dry.       Neurological:     General: No focal deficit present.     Mental Status: She is alert and oriented to person, place, and time.  Psychiatric:        Mood and Affect: Mood normal.        Behavior: Behavior normal.        Thought Content: Thought content normal.        Judgment: Judgment normal.      No results found for any visits on 08/21/23.  No results found for this or any previous visit (from the past 2160 hour(s)).    Assessment & Plan:  Nystatin cream twice daily. Keep areas clean and dry.  Problem List Items Addressed This Visit       Musculoskeletal and Integument   Skin yeast infection - Primary   Relevant Medications   mupirocin 2% oint-hydrocortisone 2.5% cream-nystatin cream-zinc oxide 13% oint 1:1:1:5 mixture    Return if symptoms worsen or fail to improve.   Total time spent: 25 minutes  Google, NP  08/21/2023   This document may have been prepared by Dragon Voice Recognition software and as such may include unintentional dictation errors.

## 2023-08-26 ENCOUNTER — Telehealth: Payer: Self-pay

## 2023-08-26 NOTE — Telephone Encounter (Signed)
Patient was prescribed a medication last Thursday and when she picked up my insurance wouldn't pay for it and was wanting to see if you could  in something else.

## 2023-09-26 ENCOUNTER — Other Ambulatory Visit: Payer: Self-pay | Admitting: Family

## 2023-09-26 ENCOUNTER — Other Ambulatory Visit: Payer: Self-pay | Admitting: Cardiology

## 2023-10-07 ENCOUNTER — Encounter: Payer: Self-pay | Admitting: Cardiology

## 2023-10-07 ENCOUNTER — Ambulatory Visit: Payer: Medicaid Other | Admitting: Cardiology

## 2023-10-07 VITALS — BP 129/75 | HR 79 | Ht 64.0 in | Wt 218.0 lb

## 2023-10-07 DIAGNOSIS — E66811 Obesity, class 1: Secondary | ICD-10-CM | POA: Insufficient documentation

## 2023-10-07 DIAGNOSIS — H9313 Tinnitus, bilateral: Secondary | ICD-10-CM

## 2023-10-07 DIAGNOSIS — Z131 Encounter for screening for diabetes mellitus: Secondary | ICD-10-CM

## 2023-10-07 DIAGNOSIS — R42 Dizziness and giddiness: Secondary | ICD-10-CM | POA: Diagnosis not present

## 2023-10-07 DIAGNOSIS — Z124 Encounter for screening for malignant neoplasm of cervix: Secondary | ICD-10-CM

## 2023-10-07 DIAGNOSIS — Z1329 Encounter for screening for other suspected endocrine disorder: Secondary | ICD-10-CM | POA: Diagnosis not present

## 2023-10-07 DIAGNOSIS — Z6837 Body mass index (BMI) 37.0-37.9, adult: Secondary | ICD-10-CM

## 2023-10-07 DIAGNOSIS — Z13220 Encounter for screening for lipoid disorders: Secondary | ICD-10-CM

## 2023-10-07 DIAGNOSIS — E6609 Other obesity due to excess calories: Secondary | ICD-10-CM | POA: Insufficient documentation

## 2023-10-07 DIAGNOSIS — E66812 Obesity, class 2: Secondary | ICD-10-CM

## 2023-10-07 MED ORDER — ZEPBOUND 2.5 MG/0.5ML ~~LOC~~ SOAJ: 2.5000 mg | SUBCUTANEOUS | 3 refills | Status: DC

## 2023-10-07 MED ORDER — FLUTICASONE PROPIONATE 50 MCG/ACT NA SUSP: 1.0000 | Freq: Every day | NASAL | 3 refills | Status: AC

## 2023-10-07 MED ORDER — NYSTATIN 100000 UNIT/GM EX POWD: 1.0000 | Freq: Three times a day (TID) | CUTANEOUS | 0 refills | Status: DC

## 2023-10-07 NOTE — Progress Notes (Signed)
Established Patient Office Visit  Subjective:  Patient ID: Sara [name], female    DOB: 08-07-1966  Age: 57 y.o. MRN: [id number]  Chief Complaint  Patient presents with   Follow-up    6 mo f/u    Patient in office for 6 month follow up. Patient reports feeling well. Complaining of vertigo, tinnitus. Will send referral to ENT.  Last pap smear 11/2019, patient unsure if she still has a IUD in place. Will refer back to GYN and IUD management.  Patient fasting today, will do lab work.  Patient requesting a medication to aid in weight loss. Will send in Zepbound. May need PA.     No other concerns at this time.   Past Medical History:  Diagnosis Date   ADHD    Contracture of left knee 01/25/2022   Depression    Dyspnea    Dysrhythmia    Heart murmur    Plantar fasciitis    bilateral feet   Thyroid disease     Past Surgical History:  Procedure Laterality Date   COLON SURGERY     COLONOSCOPY WITH PROPOFOL N/A 03/25/2022   Procedure: COLONOSCOPY WITH PROPOFOL;  Surgeon: Toney Reil, MD;  Location: [facility] ENDOSCOPY;  Service: Gastroenterology;  Laterality: N/A;   COLOSTOMY REVERSAL N/A 03/27/2022   Procedure: COLOSTOMY TAKEDOWN VENTRAL HERNIA REPAIR;  Surgeon: Quentin Ore, MD;  Location: [facility] ORS;  Service: General;  Laterality: N/A;   I & D EXTREMITY Left 11/06/2020   Procedure: IRRIGATION AND DEBRIDEMENT EXTREMITY;  Surgeon: Myrene Galas, MD;  Location: [facility] OR;  Service: Orthopedics;  Laterality: Left;   IR CATHETER TUBE CHANGE  11/15/2020   IRRIGATION AND DEBRIDEMENT KNEE  09/22/2020   Procedure: IRRIGATION AND DEBRIDEMENT LEFT KNEE PLACEMENT OF EXTERNAL FIXATION,;  Surgeon: Yolonda Kida, MD;  Location: [facility] OR;  Service: Orthopedics;;   LAPAROTOMY N/A 09/24/2020   Procedure: EXPLORATORY LAPAROTOMY COLOSTOMY  AND REPAIR  FLANK HERNIA;  Surgeon: Berna Bue, MD;  Location: [facility] OR;  Service: General;  Laterality: N/A;   LAPAROTOMY N/A 09/22/2020    Procedure: EXPLORATORY LAPAROTOMY, Ileocecectomy;  Surgeon: Berna Bue, MD;  Location: [facility] OR;  Service: General;  Laterality: N/A;   OPEN REDUCTION INTERNAL FIXATION (ORIF) DISTAL RADIAL FRACTURE Right 09/26/2020   Procedure: OPEN REDUCTION INTERNAL FIXATION (ORIF) DISTAL RADIUS FRACTURE;  Surgeon: Myrene Galas, MD;  Location: [facility] OR;  Service: Orthopedics;  Laterality: Right;   ORIF FEMUR FRACTURE Left 11/02/2020   Procedure: INCISION DRAINAGE DEEP WOUND LEFT LEG, APPLICATION OF WOUND VAC;  Surgeon: Myrene Galas, MD;  Location: [facility] OR;  Service: Orthopedics;  Laterality: Left;   ORIF FEMUR FRACTURE Left 12/07/2020   Procedure: OPEN REDUCTION INTERNAL FIXATION (ORIF) DISTAL FEMUR FRACTURE : REPAIR OF LEFT DISTAL FEMUR NONUNION;  Surgeon: Myrene Galas, MD;  Location: [facility] OR;  Service: Orthopedics;  Laterality: Left;   ORIF FEMUR FRACTURE Left 01/24/2022   Procedure: REPAIR LEFT FEMUR NONUNION, QUADRECEPLASTY;  Surgeon: Myrene Galas, MD;  Location: [facility] OR;  Service: Orthopedics;  Laterality: Left;   ORIF TIBIA PLATEAU Bilateral 09/26/2020   Procedure: OPEN REDUCTION INTERNAL FIXATION (ORIF) RIGHT DISTAL FEMUR, RIGHT CALCANEUS, LEFT DISTAL FEMUR, LEFT TIBIAL PLATEAU FRACTURE.  IRRIGATION AND DEBRIDEMENT LEFT LEG; REMOVAL OF EXTERNAL FIXATOR LEFT LEG;  Surgeon: Myrene Galas, MD;  Location: [facility] OR;  Service: Orthopedics;  Laterality: Bilateral;    Social History   Socioeconomic History   Marital status: Single    Spouse name: Not on file  Number of children: Not on file   Years of education: Not on file   Highest education level: Not on file  Occupational History   Not on file  Tobacco Use   Smoking status: Former    Types: Cigarettes   Smokeless tobacco: Former  Advertising account planner   Vaping status: Some Days   Substances: CBD  Substance and Sexual Activity   Alcohol use: Yes    Comment: occas.   Drug use: No   Sexual activity: Not Currently    Birth control/protection: I.U.D.  Other  Topics Concern   Not on file  Social History Narrative   ** Merged History Encounter **       Social Determinants of Health   Financial Resource Strain: Not on file  Food Insecurity: Not on file  Transportation Needs: Not on file  Physical Activity: Not on file  Stress: Not on file  Social Connections: Not on file  Intimate Partner Violence: Not on file    Family History  Problem Relation Age of Onset   Breast cancer Maternal Aunt     Allergies  Allergen Reactions   Azithromycin Diarrhea and Nausea And Vomiting   Benzalkonium Chloride Other (See Comments)    Pt unsure of allergy    Gabapentin Other (See Comments)    Patient states it "makes her loopy"   Augmentin [Amoxicillin-Pot Clavulanate] Nausea Only   Ciprofloxacin Nausea Only   Neomycin Rash   Neosporin [Bacitracin-Polymyxin B] Itching and Rash   Neosporin [Neomycin-Bacitracin Zn-Polymyx] Rash   Septra [Sulfamethoxazole-Trimethoprim] Nausea Only    Review of Systems  Constitutional: Negative.   HENT: Negative.    Eyes: Negative.   Respiratory: Negative.  Negative for shortness of breath.   Cardiovascular: Negative.  Negative for chest pain.  Gastrointestinal: Negative.  Negative for abdominal pain, constipation and diarrhea.  Genitourinary: Negative.   Musculoskeletal:  Negative for joint pain and myalgias.  Skin: Negative.   Neurological: Negative.  Negative for dizziness and headaches.  Endo/Heme/Allergies: Negative.   All other systems reviewed and are negative.      Objective:   BP 129/75   Pulse 79   Ht 5\' 4"  (1.626 m)   Wt 218 lb (98.9 kg)   SpO2 93%   BMI 37.42 kg/m   Vitals:   10/07/23 1402  BP: 129/75  Pulse: 79  Height: 5\' 4"  (1.626 m)  Weight: 218 lb (98.9 kg)  SpO2: 93%  BMI (Calculated): 37.4    Physical Exam Vitals and nursing note reviewed.  Constitutional:      Appearance: Normal appearance. She is normal weight.  HENT:     Head: Normocephalic and atraumatic.      Nose: Nose normal.     Mouth/Throat:     Mouth: Mucous membranes are moist.  Eyes:     Extraocular Movements: Extraocular movements intact.     Conjunctiva/sclera: Conjunctivae normal.     Pupils: Pupils are equal, round, and reactive to light.  Cardiovascular:     Rate and Rhythm: Normal rate and regular rhythm.     Pulses: Normal pulses.     Heart sounds: Normal heart sounds.  Pulmonary:     Effort: Pulmonary effort is normal.     Breath sounds: Normal breath sounds.  Abdominal:     General: Abdomen is flat. Bowel sounds are normal.     Palpations: Abdomen is soft.  Musculoskeletal:        General: Normal range of motion.     Cervical  back: Normal range of motion.  Skin:    General: Skin is warm and dry.  Neurological:     General: No focal deficit present.     Mental Status: She is alert and oriented to person, place, and time.  Psychiatric:        Mood and Affect: Mood normal.        Behavior: Behavior normal.        Thought Content: Thought content normal.        Judgment: Judgment normal.      No results found for any visits on 10/07/23.  No results found for this or any previous visit (from the past 2160 hour(s)).    Assessment & Plan:  ENT referral for vertigo, tinnitus.  Referral to GYN for pap smear and IUD management.  Fasting labs today.  Zepbound for weight loss.   Problem List Items Addressed This Visit       Other   Tinnitus of both ears   Relevant Orders   Ambulatory referral to ENT   Vertigo - Primary   Relevant Orders   Ambulatory referral to ENT   Class 2 obesity due to excess calories without serious comorbidity with body mass index (BMI) of 37.0 to 37.9 in adult   Relevant Medications   tirzepatide (ZEPBOUND) 2.5 MG/0.5ML Pen   Other Visit Diagnoses     Lipid screening       Relevant Orders   CMP14+EGFR   Lipid Profile   Thyroid disorder screening       Relevant Orders   TSH   Diabetes mellitus screening       Relevant Orders    CMP14+EGFR   Hemoglobin A1c   Pap smear for cervical cancer screening       Relevant Orders   Ambulatory referral to Obstetrics / Gynecology       Return in about 4 weeks (around 11/04/2023).   Total time spent: 25 minutes  Google, NP  10/07/2023   This document may have been prepared by Dragon Voice Recognition software and as such may include unintentional dictation errors.

## 2023-10-08 LAB — LIPID PANEL
Chol/HDL Ratio: 2.6 {ratio} (ref 0.0–4.4)
Cholesterol, Total: 146 mg/dL (ref 100–199)
HDL: 56 mg/dL (ref >39)
LDL Chol Calc (NIH): 72 mg/dL (ref 0–99)
Triglycerides: 99 mg/dL (ref 0–149)
VLDL Cholesterol Cal: 18 mg/dL (ref 5–40)

## 2023-10-08 LAB — CMP14+EGFR
ALT: 14 [IU]/L (ref 0–32)
AST: 14 [IU]/L (ref 0–40)
Albumin: 4 g/dL (ref 3.8–4.9)
Alkaline Phosphatase: 75 [IU]/L (ref 44–121)
BUN/Creatinine Ratio: 14 (ref 9–23)
BUN: 13 mg/dL (ref 6–24)
Bilirubin Total: 0.3 mg/dL (ref 0.0–1.2)
CO2: 21 mmol/L (ref 20–29)
Calcium: 9.2 mg/dL (ref 8.7–10.2)
Chloride: 103 mmol/L (ref 96–106)
Creatinine, Ser: 0.93 mg/dL (ref 0.57–1.00)
Globulin, Total: 2.5 g/dL (ref 1.5–4.5)
Glucose: 81 mg/dL (ref 70–99)
Potassium: 3.6 mmol/L (ref 3.5–5.2)
Sodium: 142 mmol/L (ref 134–144)
Total Protein: 6.5 g/dL (ref 6.0–8.5)
eGFR: 73 mL/min/{1.73_m2} (ref >59)

## 2023-10-08 LAB — TSH: TSH: 2.23 u[IU]/mL (ref 0.450–4.500)

## 2023-10-08 LAB — HEMOGLOBIN A1C
Est. average glucose Bld gHb Est-mCnc: 108 mg/dL
Hgb A1c MFr Bld: 5.4 % (ref 4.8–5.6)

## 2023-10-13 ENCOUNTER — Other Ambulatory Visit: Payer: Self-pay | Admitting: Cardiology

## 2023-10-13 ENCOUNTER — Telehealth: Payer: Self-pay

## 2023-10-13 MED ORDER — NITROFURANTOIN MONOHYD MACRO 100 MG PO CAPS: 100.0000 mg | ORAL_CAPSULE | Freq: Two times a day (BID) | ORAL | 0 refills | Status: AC

## 2023-10-13 MED ORDER — ZEPBOUND 2.5 MG/0.5ML ~~LOC~~ SOAJ: 2.5000 mg | SUBCUTANEOUS | 3 refills | Status: DC

## 2023-10-23 ENCOUNTER — Encounter: Payer: Self-pay | Admitting: Orthopedic Surgery

## 2023-10-23 ENCOUNTER — Encounter: Payer: Self-pay | Admitting: Neurology

## 2023-10-24 ENCOUNTER — Encounter: Payer: Self-pay | Admitting: Obstetrics

## 2023-10-24 ENCOUNTER — Ambulatory Visit (INDEPENDENT_AMBULATORY_CARE_PROVIDER_SITE_OTHER): Payer: Medicaid Other | Admitting: Obstetrics

## 2023-10-24 ENCOUNTER — Other Ambulatory Visit: Payer: Self-pay | Admitting: Orthopedic Surgery

## 2023-10-24 ENCOUNTER — Other Ambulatory Visit (HOSPITAL_COMMUNITY)
Admission: RE | Admit: 2023-10-24 | Discharge: 2023-10-24 | Disposition: A | Discharge status: Home / Self Care | Payer: Medicaid Other | Source: Ambulatory Visit | Attending: Obstetrics | Admitting: Obstetrics

## 2023-10-24 VITALS — BP 114/51 | HR 66 | Ht 64.0 in | Wt 215.0 lb

## 2023-10-24 DIAGNOSIS — L0292 Furuncle, unspecified: Secondary | ICD-10-CM

## 2023-10-24 DIAGNOSIS — Z124 Encounter for screening for malignant neoplasm of cervix: Secondary | ICD-10-CM

## 2023-10-24 DIAGNOSIS — Z30431 Encounter for routine checking of intrauterine contraceptive device: Secondary | ICD-10-CM | POA: Diagnosis not present

## 2023-10-24 DIAGNOSIS — S72452N Displaced supracondylar fracture without intracondylar extension of lower end of left femur, subsequent encounter for open fracture type IIIA, IIIB, or IIIC with nonunion: Secondary | ICD-10-CM

## 2023-10-24 DIAGNOSIS — S83282A Other tear of lateral meniscus, current injury, left knee, initial encounter: Secondary | ICD-10-CM

## 2023-10-24 MED ORDER — CEPHALEXIN 500 MG PO CAPS
500.0000 mg | ORAL_CAPSULE | Freq: Four times a day (QID) | ORAL | 2 refills | Status: DC
Start: 2023-10-24 — End: 2023-12-02
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
## 2023-10-24 NOTE — Addendum Note (Signed)
Addended by: Mirna Mires on: 10/24/2023 04:38 PM   Modules accepted: Orders

## 2023-10-29 LAB — CYTOLOGY - PAP
Adequacy: ABSENT
Comment: NEGATIVE
Diagnosis: NEGATIVE
High risk HPV: NEGATIVE

## 2023-10-30 ENCOUNTER — Other Ambulatory Visit: Payer: Self-pay | Admitting: Cardiology

## 2023-11-04 ENCOUNTER — Ambulatory Visit: Payer: Medicaid Other | Admitting: Cardiology

## 2023-11-04 ENCOUNTER — Encounter: Payer: Self-pay | Admitting: Cardiology

## 2023-11-04 VITALS — BP 112/62 | HR 71 | Ht 65.0 in | Wt 213.0 lb

## 2023-11-04 DIAGNOSIS — E66812 Obesity, class 2: Secondary | ICD-10-CM | POA: Diagnosis not present

## 2023-11-04 DIAGNOSIS — E6609 Other obesity due to excess calories: Secondary | ICD-10-CM

## 2023-11-04 DIAGNOSIS — Z6835 Body mass index (BMI) 35.0-35.9, adult: Secondary | ICD-10-CM

## 2023-11-04 MED ORDER — ZEPBOUND 2.5 MG/0.5ML ~~LOC~~ SOAJ: 2.5000 mg | SUBCUTANEOUS | 3 refills | Status: DC

## 2023-11-04 NOTE — Progress Notes (Signed)
Established Patient Office Visit  Subjective:  Patient ID: Sara [name], female    DOB: [birth year]  Age: 57 y.o. MRN: [id number]  No chief complaint on file.   Patient in office for 4 week follow up. Patient taking and tolerating Zepbound. Down 2 lbs since previous visit. Patient reports making dietary changes and exercising as tolerated.     No other concerns at this time.   Past Medical History:  Diagnosis Date   ADHD    Contracture of left knee 01/25/2022   Depression    Dyspnea    Dysrhythmia    Heart murmur    Plantar fasciitis    bilateral feet   Thyroid disease     Past Surgical History:  Procedure Laterality Date   COLON SURGERY     COLONOSCOPY WITH PROPOFOL N/A 03/25/2022   Procedure: COLONOSCOPY WITH PROPOFOL;  Surgeon: Toney Reil, MD;  Location: [facility] ENDOSCOPY;  Service: Gastroenterology;  Laterality: N/A;   COLOSTOMY REVERSAL N/A 03/27/2022   Procedure: COLOSTOMY TAKEDOWN VENTRAL HERNIA REPAIR;  Surgeon: Quentin Ore, MD;  Location: [facility];  Service: General;  Laterality: N/A;   I & D EXTREMITY Left 11/06/2020   Procedure: IRRIGATION AND DEBRIDEMENT EXTREMITY;  Surgeon: Myrene Galas, MD;  [facility];  Service: Orthopedics;  Laterality: Left;   IR CATHETER TUBE CHANGE  11/15/2020   IRRIGATION AND DEBRIDEMENT KNEE  09/22/2020   Procedure: IRRIGATION AND DEBRIDEMENT LEFT KNEE PLACEMENT OF EXTERNAL FIXATION,;  Surgeon: Yolonda Kida, MD;  Location: [facility] OR;  Service: Orthopedics;;   LAPAROTOMY N/A 09/24/2020   Procedure: EXPLORATORY LAPAROTOMY COLOSTOMY  AND REPAIR  FLANK HERNIA;  Surgeon: Berna Bue, MD;  [facility];  Service: General;  Laterality: N/A;   LAPAROTOMY N/A 09/22/2020   Procedure: EXPLORATORY LAPAROTOMY, Ileocecectomy;  Surgeon: Berna Bue, MD;  [facility];  Service: General;  Laterality: N/A;   OPEN REDUCTION INTERNAL FIXATION (ORIF) DISTAL RADIAL FRACTURE Right 09/26/2020   Procedure: OPEN  REDUCTION INTERNAL FIXATION (ORIF) DISTAL RADIUS FRACTURE;  Surgeon: Myrene Galas, MD;  [facility];  Service: Orthopedics;  Laterality: Right;   ORIF FEMUR FRACTURE Left 11/02/2020   Procedure: INCISION DRAINAGE DEEP WOUND LEFT LEG, APPLICATION OF WOUND VAC;  Surgeon: Myrene Galas, MD;  [facility];  Service: Orthopedics;  Laterality: Left;   ORIF FEMUR FRACTURE Left 12/07/2020   Procedure: OPEN REDUCTION INTERNAL FIXATION (ORIF) DISTAL FEMUR FRACTURE : REPAIR OF LEFT DISTAL FEMUR NONUNION;  Surgeon: Myrene Galas, MD;  [facility];  Service: Orthopedics;  Laterality: Left;   ORIF FEMUR FRACTURE Left 01/24/2022   Procedure: REPAIR LEFT FEMUR NONUNION, QUADRECEPLASTY;  Surgeon: Myrene Galas, MD;  [facility];  Service: Orthopedics;  Laterality: Left;   ORIF TIBIA PLATEAU Bilateral 09/26/2020   Procedure: OPEN REDUCTION INTERNAL FIXATION (ORIF) RIGHT DISTAL FEMUR, RIGHT CALCANEUS, LEFT DISTAL FEMUR, LEFT TIBIAL PLATEAU FRACTURE.  IRRIGATION AND DEBRIDEMENT LEFT LEG; REMOVAL OF EXTERNAL FIXATOR LEFT LEG;  Surgeon: Myrene Galas, MD;  Location[facility];  Service: Orthopedics;  Laterality: Bilateral;    Social History   Socioeconomic History   Marital status: Single    Spouse name: Not on file   Number of children: Not on file   Years of education: Not on file   Highest education level: Not on file  Occupational History   Not on file  Tobacco Use   Smoking status: Former    Types: Cigarettes   Smokeless tobacco: Former  Advertising account planner  Vaping status: Every Day   Substances: CBD  Substance and Sexual Activity   Alcohol use: Yes    Comment: occas.   Drug use: Yes    Types: Marijuana   Sexual activity: Not Currently    Birth control/protection: I.U.D.    Comment: Mirena  Other Topics Concern   Not on file  Social History Narrative   ** Merged History Encounter **       Social Determinants of Health   Financial Resource Strain: Not on file  Food Insecurity: Not on  file  Transportation Needs: Not on file  Physical Activity: Not on file  Stress: Not on file  Social Connections: Not on file  Intimate Partner Violence: Not on file    Family History  Problem Relation Age of Onset   Breast cancer Maternal Aunt        70/80s   Cancer Maternal Aunt        unsure if uterine or ovarian    Allergies  Allergen Reactions   Azithromycin Diarrhea and Nausea And Vomiting   Benzalkonium Chloride Other (See Comments)    Pt unsure of allergy    Gabapentin Other (See Comments)    Patient states it "makes her loopy"   Augmentin [Amoxicillin-Pot Clavulanate] Nausea Only   Ciprofloxacin Nausea Only   Neomycin Rash   Neosporin [Bacitracin-Polymyxin B] Itching and Rash   Neosporin [Neomycin-Bacitracin Zn-Polymyx] Rash   Septra [Sulfamethoxazole-Trimethoprim] Nausea Only    Review of Systems  Constitutional: Negative.   HENT: Negative.    Eyes: Negative.   Respiratory: Negative.  Negative for shortness of breath.   Cardiovascular: Negative.  Negative for chest pain.  Gastrointestinal: Negative.  Negative for abdominal pain, constipation and diarrhea.  Genitourinary: Negative.   Musculoskeletal:  Negative for joint pain and myalgias.  Skin: Negative.   Neurological: Negative.  Negative for dizziness and headaches.  Endo/Heme/Allergies: Negative.   All other systems reviewed and are negative.      Objective:   BP 112/62   Pulse 71   Ht 5\' 5"  (1.651 m)   Wt 213 lb (96.6 kg)   SpO2 96%   BMI 35.45 kg/m   Vitals:   11/04/23 1359  BP: 112/62  Pulse: 71  Height: 5\' 5"  (1.651 m)  Weight: 213 lb (96.6 kg)  SpO2: 96%  BMI (Calculated): 35.44    Physical Exam Vitals and nursing note reviewed.  Constitutional:      Appearance: Normal appearance. She is normal weight.  HENT:     Head: Normocephalic and atraumatic.     Nose: Nose normal.     Mouth/Throat:     Mouth: Mucous membranes are moist.  Eyes:     Extraocular Movements: Extraocular  movements intact.     Conjunctiva/sclera: Conjunctivae normal.     Pupils: Pupils are equal, round, and reactive to light.  Cardiovascular:     Rate and Rhythm: Normal rate and regular rhythm.     Pulses: Normal pulses.     Heart sounds: Normal heart sounds.  Pulmonary:     Effort: Pulmonary effort is normal.     Breath sounds: Normal breath sounds.  Abdominal:     General: Abdomen is flat. Bowel sounds are normal.     Palpations: Abdomen is soft.  Musculoskeletal:        General: Normal range of motion.     Cervical back: Normal range of motion.  Skin:    General: Skin is warm and dry.  Neurological:  General: No focal deficit present.     Mental Status: She is alert and oriented to person, place, and time.  Psychiatric:        Mood and Affect: Mood normal.        Behavior: Behavior normal.        Thought Content: Thought content normal.        Judgment: Judgment normal.      No results found for any visits on 11/04/23.  Recent Results (from the past 2160 hour(s))  CMP14+EGFR     Status: None   Collection Time: 10/07/23  4:12 PM  Result Value Ref Range   Glucose 81 70 - 99 mg/dL   BUN 13 6 - 24 mg/dL   Creatinine, Ser 7.84 0.57 - 1.00 mg/dL   eGFR 73 >69 GE/XBM/8.41   BUN/Creatinine Ratio 14 9 - 23   Sodium 142 134 - 144 mmol/L   Potassium 3.6 3.5 - 5.2 mmol/L   Chloride 103 96 - 106 mmol/L   CO2 21 20 - 29 mmol/L   Calcium 9.2 8.7 - 10.2 mg/dL   Total Protein 6.5 6.0 - 8.5 g/dL   Albumin 4.0 3.8 - 4.9 g/dL   Globulin, Total 2.5 1.5 - 4.5 g/dL   Bilirubin Total 0.3 0.0 - 1.2 mg/dL   Alkaline Phosphatase 75 44 - 121 IU/L   AST 14 0 - 40 IU/L   ALT 14 0 - 32 IU/L  Lipid Profile     Status: None   Collection Time: 10/07/23  4:12 PM  Result Value Ref Range   Cholesterol, Total 146 100 - 199 mg/dL   Triglycerides 99 0 - 149 mg/dL   HDL 56 >32 mg/dL   VLDL Cholesterol Cal 18 5 - 40 mg/dL   LDL Chol Calc (NIH) 72 0 - 99 mg/dL   Chol/HDL Ratio 2.6 0.0 - 4.4  ratio    Comment:                                   T. Chol/HDL Ratio                                             Men  Women                               1/2 Avg.Risk  3.4    3.3                                   Avg.Risk  5.0    4.4                                2X Avg.Risk  9.6    7.1                                3X Avg.Risk 23.4   11.0   TSH     Status: None   Collection Time: 10/07/23  4:12 PM  Result Value Ref Range   TSH 2.230 0.450 - 4.500 uIU/mL  Hemoglobin A1c  Status: None   Collection Time: 10/07/23  4:12 PM  Result Value Ref Range   Hgb A1c MFr Bld 5.4 4.8 - 5.6 %    Comment:          Prediabetes: 5.7 - 6.4          Diabetes: >6.4          Glycemic control for adults with diabetes: <7.0    Est. average glucose Bld gHb Est-mCnc 108 mg/dL  Cytology - PAP     Status: None   Collection Time: 10/24/23  4:38 PM  Result Value Ref Range   High risk HPV Negative    Adequacy      Satisfactory for evaluation; transformation zone component ABSENT.   Diagnosis      - Negative for intraepithelial lesion or malignancy (NILM)   Comment Normal Reference Range HPV - Negative       Assessment & Plan:  Continue current dose of Zepbound.   Problem List Items Addressed This Visit       Other   Class 2 obesity due to excess calories without serious comorbidity with body mass index (BMI) of 37.0 to 37.9 in adult - Primary    Return in about 4 weeks (around 12/02/2023).   Total time spent: 25 minutes  [employer], NP  11/04/2023   This document may have been prepared by Dragon Voice Recognition software and as such may include unintentional dictation errors.

## 2023-11-05 ENCOUNTER — Ambulatory Visit
Admission: RE | Admit: 2023-11-05 | Discharge: 2023-11-05 | Disposition: A | Discharge status: Home / Self Care | Payer: Medicaid Other | Source: Ambulatory Visit | Attending: Orthopedic Surgery | Admitting: Orthopedic Surgery

## 2023-11-05 DIAGNOSIS — S83282A Other tear of lateral meniscus, current injury, left knee, initial encounter: Secondary | ICD-10-CM

## 2023-11-05 DIAGNOSIS — S72452N Displaced supracondylar fracture without intracondylar extension of lower end of left femur, subsequent encounter for open fracture type IIIA, IIIB, or IIIC with nonunion: Secondary | ICD-10-CM

## 2023-11-11 ENCOUNTER — Ambulatory Visit (INDEPENDENT_AMBULATORY_CARE_PROVIDER_SITE_OTHER): Payer: Medicaid Other | Admitting: Neurology

## 2023-11-11 ENCOUNTER — Other Ambulatory Visit: Payer: Self-pay

## 2023-11-11 DIAGNOSIS — G5621 Lesion of ulnar nerve, right upper limb: Secondary | ICD-10-CM

## 2023-11-11 DIAGNOSIS — R202 Paresthesia of skin: Secondary | ICD-10-CM | POA: Diagnosis not present

## 2023-11-11 NOTE — Procedures (Signed)
  [facility]  [address], [address]  [place], [place] [address] Tel: [phone] Fax: [phone] Test Date:  11/11/2023  Patient: Sara [name] DOB: 06/13/1966 Physician: Jacquelyne Balint, MD  Sex: Female Height: 5\' 4"  Ref Phys: Marisue Ivan, NP  ID#: [id number]   Technician:    History: This is a 57 year old female with numbness in her right upper limb.  Findings: High frequency (4.0-16.0 MHz) B-mode, nonvascular ultrasound of the right upper limb shows: Cross sectional areas (CSA) of the right ulnar (wrist to mid upper arm) nerves are within normal limits. There is no focal enlargement, compression, or lesions of the right ulnar nerve. There is no subluxation or dislocation of either ulnar nerve from the ulnar groove with maximal elbow flexion.   Impression: This is a normal neuromuscular ultrasound. Specifically, there is no ultrasonographic evidence of entrapment/repetitive nerve trauma of the right ulnar nerve at the elbow segment. No other obvious lesion involving the adjacent bone or tendon is identified. No definite vascular abnormalities.    _______________  Jacquelyne Balint, MD [facility]   Nerve Measurements   Site Area Segment Area Ratio Mobility Vascularity Comment   mm Norm   Norm     Right Ulnar  Wrist 2.4  < 10.0         Forearm 3.1  < 10.0  Elbow - Forearm 1.77      Distal elbow 4.7  < 10.0         Elbow 5.5  < 10.0         Proximal elbow 5.8  < 10.0         Mid-arm 4.5  < 10.0          Ultrasound Images:

## 2023-11-21 ENCOUNTER — Other Ambulatory Visit: Payer: Self-pay | Admitting: Orthopedic Surgery

## 2023-11-21 DIAGNOSIS — M7122 Synovial cyst of popliteal space [Baker], left knee: Secondary | ICD-10-CM

## 2023-11-28 ENCOUNTER — Other Ambulatory Visit: Payer: Self-pay | Admitting: Cardiology

## 2023-12-01 ENCOUNTER — Other Ambulatory Visit: Payer: Self-pay

## 2023-12-01 ENCOUNTER — Other Ambulatory Visit: Payer: Self-pay | Admitting: Family

## 2023-12-01 MED ORDER — METHOCARBAMOL 500 MG PO TABS: 500.0000 mg | ORAL_TABLET | Freq: Three times a day (TID) | ORAL | 5 refills | Status: DC

## 2023-12-02 ENCOUNTER — Ambulatory Visit (INDEPENDENT_AMBULATORY_CARE_PROVIDER_SITE_OTHER): Payer: Medicaid Other | Admitting: Cardiology

## 2023-12-02 ENCOUNTER — Encounter: Payer: Self-pay | Admitting: Cardiology

## 2023-12-02 ENCOUNTER — Other Ambulatory Visit: Payer: Self-pay | Admitting: Cardiology

## 2023-12-02 VITALS — BP 114/82 | HR 63 | Ht 65.0 in | Wt 209.0 lb

## 2023-12-02 DIAGNOSIS — E6609 Other obesity due to excess calories: Secondary | ICD-10-CM

## 2023-12-02 DIAGNOSIS — E66811 Obesity, class 1: Secondary | ICD-10-CM | POA: Diagnosis not present

## 2023-12-02 DIAGNOSIS — Z6834 Body mass index (BMI) 34.0-34.9, adult: Secondary | ICD-10-CM | POA: Diagnosis not present

## 2023-12-02 DIAGNOSIS — Z0130 Encounter for examination of blood pressure without abnormal findings: Secondary | ICD-10-CM

## 2023-12-02 DIAGNOSIS — L0292 Furuncle, unspecified: Secondary | ICD-10-CM | POA: Diagnosis not present

## 2023-12-02 MED ORDER — CEPHALEXIN 500 MG PO CAPS: 500.0000 mg | ORAL_CAPSULE | Freq: Four times a day (QID) | ORAL | 2 refills | Status: DC

## 2023-12-02 MED ORDER — ZEPBOUND 5 MG/0.5ML ~~LOC~~ SOAJ: 5.0000 mg | SUBCUTANEOUS | 3 refills | Status: DC

## 2023-12-02 NOTE — Progress Notes (Signed)
Established Patient Office Visit  Subjective:  Patient ID: Sara [name], female    DOB: 01/15/1966  Age: 57 y.o. MRN: [id number]  Chief Complaint  Patient presents with   Follow-up    4 week follow up    Patient in office for 4 week follow up. Patient taking and tolerating Zepbound. Down 11 lbs total. Patient reports making dietary changes and exercising as tolerated. Will increase Zepbound to 5 mg.  Patient complaining of boils on her buttocks. Patient states she was prescribed cephalexin for boils by her GYN. Patient states they are mostly gone, requesting a refill on cephalexin.      No other concerns at this time.   Past Medical History:  Diagnosis Date   ADHD    Contracture of left knee 01/25/2022   Depression    Dyspnea    Dysrhythmia    Heart murmur    Plantar fasciitis    bilateral feet   Thyroid disease     Past Surgical History:  Procedure Laterality Date   COLON SURGERY     COLONOSCOPY WITH PROPOFOL N/A 03/25/2022   Procedure: COLONOSCOPY WITH PROPOFOL;  Surgeon: Toney Reil, MD;  Location: [facility] ENDOSCOPY;  Service: Gastroenterology;  Laterality: N/A;   COLOSTOMY REVERSAL N/A 03/27/2022   Procedure: COLOSTOMY TAKEDOWN VENTRAL HERNIA REPAIR;  Surgeon: Quentin Ore, MD;  Location: [facility] ORS;  Service: General;  Laterality: N/A;   I & D EXTREMITY Left 11/06/2020   Procedure: IRRIGATION AND DEBRIDEMENT EXTREMITY;  Surgeon: Myrene Galas, MD;  Location: [facility] OR;  Service: Orthopedics;  Laterality: Left;   IR CATHETER TUBE CHANGE  11/15/2020   IRRIGATION AND DEBRIDEMENT KNEE  09/22/2020   Procedure: IRRIGATION AND DEBRIDEMENT LEFT KNEE PLACEMENT OF EXTERNAL FIXATION,;  Surgeon: Yolonda Kida, MD;  Location: [facility] OR;  Service: Orthopedics;;   LAPAROTOMY N/A 09/24/2020   Procedure: EXPLORATORY LAPAROTOMY COLOSTOMY  AND REPAIR  FLANK HERNIA;  Surgeon: Berna Bue, MD;  [facility] OR;  Service: General;  Laterality: N/A;   LAPAROTOMY N/A  09/22/2020   Procedure: EXPLORATORY LAPAROTOMY, Ileocecectomy;  Surgeon: Berna Bue, MD;  [facility];  Service: General;  Laterality: N/A;   OPEN REDUCTION INTERNAL FIXATION (ORIF) DISTAL RADIAL FRACTURE Right 09/26/2020   Procedure: OPEN REDUCTION INTERNAL FIXATION (ORIF) DISTAL RADIUS FRACTURE;  Surgeon: Myrene Galas, MD;  [facility];  Service: Orthopedics;  Laterality: Right;   ORIF FEMUR FRACTURE Left 11/02/2020   Procedure: INCISION DRAINAGE DEEP WOUND LEFT LEG, APPLICATION OF WOUND VAC;  Surgeon: Myrene Galas, MD;  [facility] OR;  Service: Orthopedics;  Laterality: Left;   ORIF FEMUR FRACTURE Left 12/07/2020   Procedure: OPEN REDUCTION INTERNAL FIXATION (ORIF) DISTAL FEMUR FRACTURE : REPAIR OF LEFT DISTAL FEMUR NONUNION;  Surgeon: Myrene Galas, MD;  [facility];  Service: Orthopedics;  Laterality: Left;   ORIF FEMUR FRACTURE Left 01/24/2022   Procedure: REPAIR LEFT FEMUR NONUNION, QUADRECEPLASTY;  Surgeon: Myrene Galas, MD;  [facility];  Service: Orthopedics;  Laterality: Left;   ORIF TIBIA PLATEAU Bilateral 09/26/2020   Procedure: OPEN REDUCTION INTERNAL FIXATION (ORIF) RIGHT DISTAL FEMUR, RIGHT CALCANEUS, LEFT DISTAL FEMUR, LEFT TIBIAL PLATEAU FRACTURE.  IRRIGATION AND DEBRIDEMENT LEFT LEG; REMOVAL OF EXTERNAL FIXATOR LEFT LEG;  Surgeon: Myrene Galas, MD;  [facility] OR;  Service: Orthopedics;  Laterality: Bilateral;    Social History   Socioeconomic History   Marital status: Single    Spouse name: Not on file   Number of children: Not  on file   Years of education: Not on file   Highest education level: Not on file  Occupational History   Not on file  Tobacco Use   Smoking status: Former    Types: Cigarettes   Smokeless tobacco: Former  Advertising account planner   Vaping status: Every Day   Substances: CBD  Substance and Sexual Activity   Alcohol use: Yes    Comment: occas.   Drug use: Yes    Types: Marijuana   Sexual activity: Not Currently    Birth  control/protection: I.U.D.    Comment: Mirena  Other Topics Concern   Not on file  Social History Narrative   ** Merged History Encounter **       Social Determinants of Health   Financial Resource Strain: Not on file  Food Insecurity: Not on file  Transportation Needs: Not on file  Physical Activity: Not on file  Stress: Not on file  Social Connections: Not on file  Intimate Partner Violence: Not on file    Family History  Problem Relation Age of Onset   Breast cancer Maternal Aunt        70/80s   Cancer Maternal Aunt        unsure if uterine or ovarian    Allergies  Allergen Reactions   Azithromycin Diarrhea and Nausea And Vomiting   Benzalkonium Chloride Other (See Comments)    Pt unsure of allergy    Gabapentin Other (See Comments)    Patient states it "makes her loopy"   Augmentin [Amoxicillin-Pot Clavulanate] Nausea Only   Ciprofloxacin Nausea Only   Neomycin Rash   Neosporin [Bacitracin-Polymyxin B] Itching and Rash   Neosporin [Neomycin-Bacitracin Zn-Polymyx] Rash   Septra [Sulfamethoxazole-Trimethoprim] Nausea Only    Outpatient Medications Prior to Visit  Medication Sig   albuterol (VENTOLIN HFA) 108 (90 Base) MCG/ACT inhaler Inhale 2 puffs into the lungs every 6 (six) hours as needed for wheezing or shortness of breath.   fluticasone (FLONASE) 50 MCG/ACT nasal spray Place 1 spray into both nostrils daily.   levonorgestrel (MIRENA) 20 MCG/DAY IUD 1 each by Intrauterine route once.   methocarbamol (ROBAXIN) 500 MG tablet Take 1 tablet (500 mg total) by mouth 3 (three) times daily.   nystatin (MYCOSTATIN/NYSTOP) powder Apply 1 Application topically 3 (three) times daily.   ondansetron (ZOFRAN) 4 MG tablet TAKE 1 TABLET BY MOUTH EVERY 8 HOURS AS NEEDED FOR NAUSEA OR VOMITING   spironolactone (ALDACTONE) 25 MG tablet Take 1 tablet (25 mg total) by mouth daily.   TRINTELLIX 20 MG TABS tablet TAKE 1 TABLET BY MOUTH ONCE DAILY   [DISCONTINUED] tirzepatide  (ZEPBOUND) 2.5 MG/0.5ML Pen Inject 2.5 mg into the skin once a week.   [DISCONTINUED] cephALEXin (KEFLEX) 500 MG capsule Take 1 capsule (500 mg total) by mouth 4 (four) times daily. (Patient not taking: Reported on 12/02/2023)   No facility-administered medications prior to visit.    Review of Systems  Constitutional: Negative.   HENT: Negative.    Eyes: Negative.   Respiratory: Negative.  Negative for shortness of breath.   Cardiovascular: Negative.  Negative for chest pain.  Gastrointestinal: Negative.  Negative for abdominal pain, constipation and diarrhea.  Genitourinary: Negative.   Musculoskeletal:  Negative for joint pain and myalgias.  Skin: Negative.   Neurological: Negative.  Negative for dizziness and headaches.  Endo/Heme/Allergies: Negative.   All other systems reviewed and are negative.      Objective:   BP 114/82   Pulse 63  Ht 5\' 5"  (1.651 m)   Wt 209 lb (94.8 kg)   SpO2 95%   BMI 34.78 kg/m   Vitals:   12/02/23 1353  BP: 114/82  Pulse: 63  Height: 5\' 5"  (1.651 m)  Weight: 209 lb (94.8 kg)  SpO2: 95%  BMI (Calculated): 34.78    Physical Exam Vitals and nursing note reviewed.  Constitutional:      Appearance: Normal appearance. She is normal weight.  HENT:     Head: Normocephalic and atraumatic.     Nose: Nose normal.     Mouth/Throat:     Mouth: Mucous membranes are moist.  Eyes:     Extraocular Movements: Extraocular movements intact.     Conjunctiva/sclera: Conjunctivae normal.     Pupils: Pupils are equal, round, and reactive to light.  Cardiovascular:     Rate and Rhythm: Normal rate and regular rhythm.     Pulses: Normal pulses.     Heart sounds: Normal heart sounds.  Pulmonary:     Effort: Pulmonary effort is normal.     Breath sounds: Normal breath sounds.  Abdominal:     General: Abdomen is flat. Bowel sounds are normal.     Palpations: Abdomen is soft.  Musculoskeletal:        General: Normal range of motion.     Cervical  back: Normal range of motion.  Skin:    General: Skin is warm and dry.  Neurological:     General: No focal deficit present.     Mental Status: She is alert and oriented to person, place, and time.  Psychiatric:        Mood and Affect: Mood normal.        Behavior: Behavior normal.        Thought Content: Thought content normal.        Judgment: Judgment normal.      No results found for any visits on 12/02/23.  Recent Results (from the past 2160 hour(s))  CMP14+EGFR     Status: None   Collection Time: 10/07/23  4:12 PM  Result Value Ref Range   Glucose 81 70 - 99 mg/dL   BUN 13 6 - 24 mg/dL   Creatinine, Ser 4.54 0.57 - 1.00 mg/dL   eGFR 73 >09 WJ/XBJ/4.78   BUN/Creatinine Ratio 14 9 - 23   Sodium 142 134 - 144 mmol/L   Potassium 3.6 3.5 - 5.2 mmol/L   Chloride 103 96 - 106 mmol/L   CO2 21 20 - 29 mmol/L   Calcium 9.2 8.7 - 10.2 mg/dL   Total Protein 6.5 6.0 - 8.5 g/dL   Albumin 4.0 3.8 - 4.9 g/dL   Globulin, Total 2.5 1.5 - 4.5 g/dL   Bilirubin Total 0.3 0.0 - 1.2 mg/dL   Alkaline Phosphatase 75 44 - 121 IU/L   AST 14 0 - 40 IU/L   ALT 14 0 - 32 IU/L  Lipid Profile     Status: None   Collection Time: 10/07/23  4:12 PM  Result Value Ref Range   Cholesterol, Total 146 100 - 199 mg/dL   Triglycerides 99 0 - 149 mg/dL   HDL 56 >29 mg/dL   VLDL Cholesterol Cal 18 5 - 40 mg/dL   LDL Chol Calc (NIH) 72 0 - 99 mg/dL   Chol/HDL Ratio 2.6 0.0 - 4.4 ratio    Comment:  T. Chol/HDL Ratio                                             Men  Women                               1/2 Avg.Risk  3.4    3.3                                   Avg.Risk  5.0    4.4                                2X Avg.Risk  9.6    7.1                                3X Avg.Risk 23.4   11.0   TSH     Status: None   Collection Time: 10/07/23  4:12 PM  Result Value Ref Range   TSH 2.230 0.450 - 4.500 uIU/mL  Hemoglobin A1c     Status: None   Collection Time: 10/07/23   4:12 PM  Result Value Ref Range   Hgb A1c MFr Bld 5.4 4.8 - 5.6 %    Comment:          Prediabetes: 5.7 - 6.4          Diabetes: >6.4          Glycemic control for adults with diabetes: <7.0    Est. average glucose Bld gHb Est-mCnc 108 mg/dL  Cytology - PAP     Status: None   Collection Time: 10/24/23  4:38 PM  Result Value Ref Range   High risk HPV Negative    Adequacy      Satisfactory for evaluation; transformation zone component ABSENT.   Diagnosis      - Negative for intraepithelial lesion or malignancy (NILM)   Comment Normal Reference Range HPV - Negative       Assessment & Plan:  Increase Zepbound to 5 mg  Cephalexin refilled.   Problem List Items Addressed This Visit       Other   Class 1 obesity due to excess calories without serious comorbidity with body mass index (BMI) of 34.0 to 34.9 in adult - Primary   Relevant Medications   tirzepatide (ZEPBOUND) 5 MG/0.5ML Pen   Other Visit Diagnoses     Recurrent boils       Relevant Medications   cephALEXin (KEFLEX) 500 MG capsule       Return in about 2 months (around 02/02/2024).   Total time spent: 25 minutes  [employer], NP  12/02/2023   This document may have been prepared by Dragon Voice Recognition software and as such may include unintentional dictation errors.

## 2023-12-03 ENCOUNTER — Ambulatory Visit
Admission: RE | Admit: 2023-12-03 | Discharge: 2023-12-03 | Disposition: A | Discharge status: Home / Self Care | Payer: Medicaid Other | Source: Ambulatory Visit | Attending: Orthopedic Surgery | Admitting: Orthopedic Surgery

## 2023-12-03 DIAGNOSIS — M7122 Synovial cyst of popliteal space [Baker], left knee: Secondary | ICD-10-CM

## 2024-01-07 ENCOUNTER — Encounter: Payer: Self-pay | Admitting: Cardiology

## 2024-01-08 ENCOUNTER — Other Ambulatory Visit: Payer: Self-pay | Admitting: Cardiology

## 2024-01-08 MED ORDER — METHYLPREDNISOLONE 4 MG PO TBPK: ORAL_TABLET | ORAL | 0 refills | Status: DC

## 2024-01-08 MED ORDER — ZEPBOUND 7.5 MG/0.5ML ~~LOC~~ SOAJ: 7.5000 mg | SUBCUTANEOUS | 3 refills | Status: DC

## 2024-01-16 ENCOUNTER — Encounter (HOSPITAL_COMMUNITY): Payer: Self-pay | Admitting: Orthopedic Surgery

## 2024-01-16 ENCOUNTER — Other Ambulatory Visit: Payer: Self-pay

## 2024-01-16 NOTE — Progress Notes (Signed)
PCP - Marisue Ivan, NP  Cardiologist -Dr Ursula Beath   Chest x-ray - n/a EKG - DOS (01/20/24) Stress Test -  n/a ECHO - 09/04/20 Cardiac Cath - n/a  ICD Pacemaker/Loop - n/a  Sleep Study -  n/a CPAP - none  Diabetes - n/a  Blood Thinner Instructions:  n/a  Aspirin Instructions: n/a  Hold Zepbound (weight loss) for 7 days prior to procedure.  Last dose was on 01/09/24.   ERAS - Clear liquids til 5 AM DOS.  Anesthesia review: Yes  STOP now taking any Aspirin (unless otherwise instructed by your surgeon), Aleve, Naproxen, Ibuprofen, Motrin, Advil, Goody's, BC's, all herbal medications, fish oil, and all vitamins.   Coronavirus Screening Do you have any of the following symptoms:  Cough yes/no: No Fever (>100.58F)  yes/no: No Runny nose yes/no: No Sore throat yes/no: No Difficulty breathing/shortness of breath  yes/no: No  Have you traveled in the last 14 days and where? yes/no: No  Patient verbalized understanding of instructions that were given to them at the PAT appointment. Patient was also instructed that they will need to review over the PAT instructions again at home before surgery.

## 2024-01-20 ENCOUNTER — Encounter (HOSPITAL_COMMUNITY): Payer: Self-pay | Admitting: Orthopedic Surgery

## 2024-01-20 ENCOUNTER — Inpatient Hospital Stay (HOSPITAL_COMMUNITY): Payer: Medicaid Other

## 2024-01-20 ENCOUNTER — Encounter (HOSPITAL_COMMUNITY)
Admission: RE | Disposition: A | Discharge status: Home / Self Care | Payer: Self-pay | Source: Ambulatory Visit | Attending: Orthopedic Surgery

## 2024-01-20 ENCOUNTER — Inpatient Hospital Stay (HOSPITAL_BASED_OUTPATIENT_CLINIC_OR_DEPARTMENT_OTHER): Payer: Self-pay | Admitting: Physician Assistant

## 2024-01-20 ENCOUNTER — Inpatient Hospital Stay (HOSPITAL_COMMUNITY): Payer: Self-pay | Admitting: Physician Assistant

## 2024-01-20 ENCOUNTER — Ambulatory Visit (HOSPITAL_COMMUNITY)
Admission: RE | Admit: 2024-01-20 | Discharge: 2024-01-21 | Disposition: A | Discharge status: Home / Self Care | Payer: Medicaid Other | Source: Ambulatory Visit | Attending: Orthopedic Surgery | Admitting: Orthopedic Surgery

## 2024-01-20 ENCOUNTER — Other Ambulatory Visit: Payer: Self-pay

## 2024-01-20 DIAGNOSIS — M1731 Unilateral post-traumatic osteoarthritis, right knee: Secondary | ICD-10-CM | POA: Diagnosis not present

## 2024-01-20 DIAGNOSIS — F17290 Nicotine dependence, other tobacco product, uncomplicated: Secondary | ICD-10-CM | POA: Insufficient documentation

## 2024-01-20 DIAGNOSIS — Z793 Long term (current) use of hormonal contraceptives: Secondary | ICD-10-CM | POA: Diagnosis not present

## 2024-01-20 DIAGNOSIS — M12561 Traumatic arthropathy, right knee: Secondary | ICD-10-CM | POA: Diagnosis present

## 2024-01-20 DIAGNOSIS — E039 Hypothyroidism, unspecified: Secondary | ICD-10-CM | POA: Diagnosis not present

## 2024-01-20 DIAGNOSIS — S72462N Displaced supracondylar fracture with intracondylar extension of lower end of left femur, subsequent encounter for open fracture type IIIA, IIIB, or IIIC with nonunion: Principal | ICD-10-CM

## 2024-01-20 DIAGNOSIS — Z87891 Personal history of nicotine dependence: Secondary | ICD-10-CM | POA: Diagnosis not present

## 2024-01-20 DIAGNOSIS — I10 Essential (primary) hypertension: Secondary | ICD-10-CM | POA: Insufficient documentation

## 2024-01-20 DIAGNOSIS — S72452K Displaced supracondylar fracture without intracondylar extension of lower end of left femur, subsequent encounter for closed fracture with nonunion: Secondary | ICD-10-CM | POA: Diagnosis not present

## 2024-01-20 DIAGNOSIS — S72492N Other fracture of lower end of left femur, subsequent encounter for open fracture type IIIA, IIIB, or IIIC with nonunion: Secondary | ICD-10-CM

## 2024-01-20 DIAGNOSIS — S72402M Unspecified fracture of lower end of left femur, subsequent encounter for open fracture type I or II with nonunion: Secondary | ICD-10-CM | POA: Diagnosis present

## 2024-01-20 DIAGNOSIS — F1290 Cannabis use, unspecified, uncomplicated: Secondary | ICD-10-CM | POA: Insufficient documentation

## 2024-01-20 HISTORY — DX: Hypothyroidism, unspecified: E03.9

## 2024-01-20 HISTORY — PX: FEMUR IM NAIL: SHX1597

## 2024-01-20 LAB — URINALYSIS, ROUTINE W REFLEX MICROSCOPIC
Bilirubin Urine: NEGATIVE
Glucose, UA: NEGATIVE mg/dL
Ketones, ur: NEGATIVE mg/dL
Nitrite: NEGATIVE
Protein, ur: 30 mg/dL — AB
Specific Gravity, Urine: 1.027 (ref 1.005–1.030)
pH: 5 (ref 5.0–8.0)

## 2024-01-20 LAB — CBC WITH DIFFERENTIAL/PLATELET
Abs Immature Granulocytes: 0.06 10*3/uL (ref 0.00–0.07)
Basophils Absolute: 0.1 10*3/uL (ref 0.0–0.1)
Basophils Relative: 1 %
Eosinophils Absolute: 0.1 10*3/uL (ref 0.0–0.5)
Eosinophils Relative: 1 %
HCT: 45.8 % (ref 36.0–46.0)
Hemoglobin: 14.6 g/dL (ref 12.0–15.0)
Immature Granulocytes: 1 %
Lymphocytes Relative: 18 %
Lymphs Abs: 1.9 10*3/uL (ref 0.7–4.0)
MCH: 30 pg (ref 26.0–34.0)
MCHC: 31.9 g/dL (ref 30.0–36.0)
MCV: 94.2 fL (ref 80.0–100.0)
Monocytes Absolute: 1.1 10*3/uL — ABNORMAL HIGH (ref 0.1–1.0)
Monocytes Relative: 10 %
Neutro Abs: 7.4 10*3/uL (ref 1.7–7.7)
Neutrophils Relative %: 69 %
Platelets: 194 10*3/uL (ref 150–400)
RBC: 4.86 MIL/uL (ref 3.87–5.11)
RDW: 13.9 % (ref 11.5–15.5)
WBC: 10.7 10*3/uL — ABNORMAL HIGH (ref 4.0–10.5)
nRBC: 0 % (ref 0.0–0.2)

## 2024-01-20 LAB — POCT I-STAT, CHEM 8
BUN: 17 mg/dL (ref 6–20)
Calcium, Ion: 1.09 mmol/L — ABNORMAL LOW (ref 1.15–1.40)
Chloride: 103 mmol/L (ref 98–111)
Creatinine, Ser: 1.1 mg/dL — ABNORMAL HIGH (ref 0.44–1.00)
Glucose, Bld: 91 mg/dL (ref 70–99)
HCT: 45 % (ref 36.0–46.0)
Hemoglobin: 15.3 g/dL — ABNORMAL HIGH (ref 12.0–15.0)
Potassium: 3.6 mmol/L (ref 3.5–5.1)
Sodium: 140 mmol/L (ref 135–145)
TCO2: 31 mmol/L (ref 22–32)

## 2024-01-20 LAB — PROTIME-INR
INR: 1 (ref 0.8–1.2)
Prothrombin Time: 13.1 s (ref 11.4–15.2)

## 2024-01-20 LAB — C-REACTIVE PROTEIN: CRP: 1.2 mg/dL — ABNORMAL HIGH (ref <1.0)

## 2024-01-20 LAB — SEDIMENTATION RATE: Sed Rate: 6 mm/h (ref 0–22)

## 2024-01-20 LAB — VITAMIN D 25 HYDROXY (VIT D DEFICIENCY, FRACTURES): Vit D, 25-Hydroxy: 82.89 ng/mL (ref 30–100)

## 2024-01-20 LAB — POCT PREGNANCY, URINE: Preg Test, Ur: NEGATIVE

## 2024-01-20 SURGERY — INSERTION, INTRAMEDULLARY ROD, FEMUR
Anesthesia: General | Laterality: Left

## 2024-01-20 MED ORDER — CEFAZOLIN SODIUM-DEXTROSE 2-3 GM-%(50ML) IV SOLR
INTRAVENOUS | Status: DC | PRN
  Administered 2024-01-20: 2 g via INTRAVENOUS

## 2024-01-20 MED ORDER — VORTIOXETINE HBR 20 MG PO TABS
20.0000 mg | ORAL_TABLET | Freq: Every morning | ORAL | Status: DC
  Administered 2024-01-21: 20 mg via ORAL
  Filled 2024-01-20: qty 1

## 2024-01-20 MED ORDER — ACETAMINOPHEN 325 MG PO TABS: 325.0000 mg | ORAL_TABLET | Freq: Four times a day (QID) | ORAL | Status: DC | PRN

## 2024-01-20 MED ORDER — ONDANSETRON HCL 4 MG/2ML IJ SOLN
INTRAMUSCULAR | Status: AC
  Filled 2024-01-20: qty 2

## 2024-01-20 MED ORDER — HYDROMORPHONE HCL 1 MG/ML IJ SOLN
0.2500 mg | INTRAMUSCULAR | Status: DC | PRN
Start: 2024-01-20 — End: 2024-01-20
  Administered 2024-01-20 (×4): 0.5 mg via INTRAVENOUS

## 2024-01-20 MED ORDER — ONDANSETRON HCL 4 MG/2ML IJ SOLN: 4.0000 mg | Freq: Four times a day (QID) | INTRAMUSCULAR | Status: DC | PRN

## 2024-01-20 MED ORDER — DOCUSATE SODIUM 100 MG PO CAPS
100.0000 mg | ORAL_CAPSULE | Freq: Two times a day (BID) | ORAL | Status: DC
  Administered 2024-01-20 – 2024-01-21 (×2): 100 mg via ORAL
  Filled 2024-01-20 (×2): qty 1

## 2024-01-20 MED ORDER — HYDROMORPHONE HCL 1 MG/ML IJ SOLN
INTRAMUSCULAR | Status: AC
  Filled 2024-01-20: qty 1

## 2024-01-20 MED ORDER — ORAL CARE MOUTH RINSE: 15.0000 mL | Freq: Once | OROMUCOSAL | Status: AC

## 2024-01-20 MED ORDER — KETOROLAC TROMETHAMINE 15 MG/ML IJ SOLN
7.5000 mg | Freq: Four times a day (QID) | INTRAMUSCULAR | Status: DC
  Administered 2024-01-20 – 2024-01-21 (×3): 7.5 mg via INTRAVENOUS
  Filled 2024-01-20 (×4): qty 1

## 2024-01-20 MED ORDER — SUGAMMADEX SODIUM 200 MG/2ML IV SOLN
INTRAVENOUS | Status: DC | PRN
  Administered 2024-01-20: 200 mg via INTRAVENOUS

## 2024-01-20 MED ORDER — SPIRONOLACTONE 25 MG PO TABS
25.0000 mg | ORAL_TABLET | Freq: Every day | ORAL | Status: DC
  Filled 2024-01-20: qty 1

## 2024-01-20 MED ORDER — CHLORHEXIDINE GLUCONATE 0.12 % MT SOLN
15.0000 mL | Freq: Once | OROMUCOSAL | Status: AC
  Administered 2024-01-20: 15 mL via OROMUCOSAL
  Filled 2024-01-20: qty 15

## 2024-01-20 MED ORDER — FENTANYL CITRATE (PF) 250 MCG/5ML IJ SOLN
INTRAMUSCULAR | Status: DC | PRN
  Administered 2024-01-20: 50 ug via INTRAVENOUS
  Administered 2024-01-20: 100 ug via INTRAVENOUS

## 2024-01-20 MED ORDER — ONDANSETRON HCL 4 MG/2ML IJ SOLN
INTRAMUSCULAR | Status: DC | PRN
  Administered 2024-01-20: 4 mg via INTRAVENOUS

## 2024-01-20 MED ORDER — EPHEDRINE SULFATE-NACL 50-0.9 MG/10ML-% IV SOSY
PREFILLED_SYRINGE | INTRAVENOUS | Status: DC | PRN
  Administered 2024-01-20 (×3): 5 mg via INTRAVENOUS
  Administered 2024-01-20 (×2): 2.5 mg via INTRAVENOUS

## 2024-01-20 MED ORDER — POTASSIUM CHLORIDE IN NACL 20-0.9 MEQ/L-% IV SOLN
INTRAVENOUS | Status: DC
  Filled 2024-01-20: qty 1000

## 2024-01-20 MED ORDER — OXYCODONE HCL 5 MG PO TABS
ORAL_TABLET | ORAL | Status: AC
  Filled 2024-01-20: qty 2

## 2024-01-20 MED ORDER — FENTANYL CITRATE (PF) 250 MCG/5ML IJ SOLN
INTRAMUSCULAR | Status: AC
  Filled 2024-01-20: qty 5

## 2024-01-20 MED ORDER — HYDROMORPHONE HCL 1 MG/ML IJ SOLN: 0.5000 mg | INTRAMUSCULAR | Status: DC | PRN

## 2024-01-20 MED ORDER — MIDAZOLAM HCL 2 MG/2ML IJ SOLN
INTRAMUSCULAR | Status: AC
  Filled 2024-01-20: qty 2

## 2024-01-20 MED ORDER — LIDOCAINE 2% (20 MG/ML) 5 ML SYRINGE
INTRAMUSCULAR | Status: AC
  Filled 2024-01-20: qty 5

## 2024-01-20 MED ORDER — ALBUMIN HUMAN 5 % IV SOLN: INTRAVENOUS | Status: DC | PRN

## 2024-01-20 MED ORDER — PROPOFOL 10 MG/ML IV BOLUS
INTRAVENOUS | Status: AC
  Filled 2024-01-20: qty 20

## 2024-01-20 MED ORDER — FENTANYL CITRATE (PF) 100 MCG/2ML IJ SOLN
INTRAMUSCULAR | Status: AC
  Filled 2024-01-20: qty 2

## 2024-01-20 MED ORDER — DEXAMETHASONE SODIUM PHOSPHATE 10 MG/ML IJ SOLN
INTRAMUSCULAR | Status: AC
  Filled 2024-01-20: qty 1

## 2024-01-20 MED ORDER — PROPOFOL 10 MG/ML IV BOLUS
INTRAVENOUS | Status: DC | PRN
  Administered 2024-01-20: 150 mg via INTRAVENOUS

## 2024-01-20 MED ORDER — ONDANSETRON HCL 4 MG PO TABS: 4.0000 mg | ORAL_TABLET | Freq: Four times a day (QID) | ORAL | Status: DC | PRN

## 2024-01-20 MED ORDER — PHENYLEPHRINE HCL-NACL 20-0.9 MG/250ML-% IV SOLN
INTRAVENOUS | Status: DC | PRN
  Administered 2024-01-20: 15 ug/min via INTRAVENOUS

## 2024-01-20 MED ORDER — METOCLOPRAMIDE HCL 5 MG PO TABS: 5.0000 mg | ORAL_TABLET | Freq: Three times a day (TID) | ORAL | Status: DC | PRN

## 2024-01-20 MED ORDER — OXYCODONE HCL 5 MG PO TABS
5.0000 mg | ORAL_TABLET | ORAL | Status: DC | PRN
  Administered 2024-01-20 – 2024-01-21 (×2): 10 mg via ORAL
  Filled 2024-01-20: qty 2

## 2024-01-20 MED ORDER — ACETAMINOPHEN 500 MG PO TABS
1000.0000 mg | ORAL_TABLET | Freq: Four times a day (QID) | ORAL | Status: DC
  Administered 2024-01-20 – 2024-01-21 (×3): 1000 mg via ORAL
  Filled 2024-01-20 (×4): qty 2

## 2024-01-20 MED ORDER — MIDAZOLAM HCL 2 MG/2ML IJ SOLN
INTRAMUSCULAR | Status: DC | PRN
  Administered 2024-01-20: 2 mg via INTRAVENOUS

## 2024-01-20 MED ORDER — LIDOCAINE 2% (20 MG/ML) 5 ML SYRINGE
INTRAMUSCULAR | Status: DC | PRN
  Administered 2024-01-20: 60 mg via INTRAVENOUS

## 2024-01-20 MED ORDER — FENTANYL CITRATE (PF) 100 MCG/2ML IJ SOLN
25.0000 ug | INTRAMUSCULAR | Status: DC | PRN
  Administered 2024-01-20 (×3): 50 ug via INTRAVENOUS

## 2024-01-20 MED ORDER — CEFAZOLIN SODIUM-DEXTROSE 2-4 GM/100ML-% IV SOLN
2.0000 g | Freq: Four times a day (QID) | INTRAVENOUS | Status: AC
Start: 2024-01-20 — End: 2024-01-20
  Administered 2024-01-20 (×2): 2 g via INTRAVENOUS
  Filled 2024-01-20 (×2): qty 100

## 2024-01-20 MED ORDER — ROCURONIUM BROMIDE 10 MG/ML (PF) SYRINGE
PREFILLED_SYRINGE | INTRAVENOUS | Status: DC | PRN
  Administered 2024-01-20: 50 mg via INTRAVENOUS
  Administered 2024-01-20: 20 mg via INTRAVENOUS

## 2024-01-20 MED ORDER — CEFAZOLIN SODIUM-DEXTROSE 2-4 GM/100ML-% IV SOLN
INTRAVENOUS | Status: AC
  Filled 2024-01-20: qty 200

## 2024-01-20 MED ORDER — DEXAMETHASONE SODIUM PHOSPHATE 10 MG/ML IJ SOLN
INTRAMUSCULAR | Status: DC | PRN
  Administered 2024-01-20: 5 mg via INTRAVENOUS

## 2024-01-20 MED ORDER — SODIUM CHLORIDE 0.9 % IV SOLN: INTRAVENOUS | Status: DC | PRN

## 2024-01-20 MED ORDER — ACETAMINOPHEN 500 MG PO TABS
1000.0000 mg | ORAL_TABLET | Freq: Once | ORAL | Status: AC
  Administered 2024-01-20: 1000 mg via ORAL
  Filled 2024-01-20: qty 2

## 2024-01-20 MED ORDER — AMISULPRIDE (ANTIEMETIC) 5 MG/2ML IV SOLN: 10.0000 mg | Freq: Once | INTRAVENOUS | Status: DC | PRN

## 2024-01-20 MED ORDER — METOCLOPRAMIDE HCL 5 MG/ML IJ SOLN: 5.0000 mg | Freq: Three times a day (TID) | INTRAMUSCULAR | Status: DC | PRN

## 2024-01-20 MED ORDER — PHENYLEPHRINE 80 MCG/ML (10ML) SYRINGE FOR IV PUSH (FOR BLOOD PRESSURE SUPPORT)
PREFILLED_SYRINGE | INTRAVENOUS | Status: DC | PRN
  Administered 2024-01-20: 160 ug via INTRAVENOUS
  Administered 2024-01-20 (×2): 80 ug via INTRAVENOUS

## 2024-01-20 MED ORDER — 0.9 % SODIUM CHLORIDE (POUR BTL) OPTIME
TOPICAL | Status: DC | PRN
  Administered 2024-01-20: 1000 mL

## 2024-01-20 SURGICAL SUPPLY — 49 items
BAG COUNTER SPONGE SURGICOUNT (BAG) ×1 IMPLANT
BIT DRILL PANGEA 3.2X215 (BIT) IMPLANT
BIT DRILL PANGEA 4.3X215 (BIT) IMPLANT
BONE CANC CHIPS 40CC CAN1/2 (Bone Implant) ×1 IMPLANT
BRUSH SCRUB EZ PLAIN DRY (MISCELLANEOUS) ×2 IMPLANT
COVER BACK TABLE 80X110 HD (DRAPES) IMPLANT
COVER SURGICAL LIGHT HANDLE (MISCELLANEOUS) ×2 IMPLANT
DRAPE C-ARMOR (DRAPES) ×1 IMPLANT
DRAPE HALF SHEET 40X57 (DRAPES) IMPLANT
DRAPE SURG ORHT 6 SPLT 77X108 (DRAPES) ×2 IMPLANT
DRAPE U-SHAPE 47X51 STRL (DRAPES) ×1 IMPLANT
DRESSING MEPILEX FLEX 4X4 (GAUZE/BANDAGES/DRESSINGS) ×1 IMPLANT
DRSG MEPILEX FLEX 4X4 (GAUZE/BANDAGES/DRESSINGS) ×1
DRSG MEPILEX POST OP 4X8 (GAUZE/BANDAGES/DRESSINGS) ×1 IMPLANT
ELECT REM PT RETURN 9FT ADLT (ELECTROSURGICAL) ×1
ELECTRODE REM PT RTRN 9FT ADLT (ELECTROSURGICAL) ×1 IMPLANT
GLOVE BIO SURGEON STRL SZ7.5 (GLOVE) ×1 IMPLANT
GLOVE BIO SURGEON STRL SZ8 (GLOVE) ×1 IMPLANT
GLOVE BIOGEL PI IND STRL 7.5 (GLOVE) ×1 IMPLANT
GLOVE BIOGEL PI IND STRL 8 (GLOVE) ×1 IMPLANT
GLOVE SURG ORTHO LTX SZ7.5 (GLOVE) ×2 IMPLANT
GOWN STRL REUS W/ TWL LRG LVL3 (GOWN DISPOSABLE) ×2 IMPLANT
GOWN STRL REUS W/ TWL XL LVL3 (GOWN DISPOSABLE) ×1 IMPLANT
GRAFT BNE CHIP CANC 1-8 40 (Bone Implant) IMPLANT
K-WIRE SMOOTH 2.0X150 (WIRE) ×3
KIT BASIN OR (CUSTOM PROCEDURE TRAY) ×1 IMPLANT
KIT INFUSE LRG II (Orthopedic Implant) IMPLANT
KIT TURNOVER KIT B (KITS) ×1 IMPLANT
KWIRE SMOOTH 2.0X150 (WIRE) IMPLANT
MANIFOLD NEPTUNE II (INSTRUMENTS) ×1 IMPLANT
NS IRRIG 1000ML POUR BTL (IV SOLUTION) ×1 IMPLANT
PACK GENERAL/GYN (CUSTOM PROCEDURE TRAY) ×1 IMPLANT
PAD ARMBOARD 7.5X6 YLW CONV (MISCELLANEOUS) ×2 IMPLANT
PLATE DM FEM 5X226 8H STL (Plate) IMPLANT
SCREW CORT ST AXSOS 4.5X26 (Screw) IMPLANT
SCREW CORT ST AXSOS 4.5X80 (Screw) IMPLANT
SCREW CORTICAL 4.5X36MM (Screw) IMPLANT
SCREW CORTICAL 4.5X44MM (Screw) IMPLANT
SCREW LOCK PANGEA 5X65 (Screw) IMPLANT
SCREW LOCK PANGEA 5X75 (Screw) IMPLANT
SCREW LOCK PANGEA 5X80 (Screw) IMPLANT
STAPLER VISISTAT 35W (STAPLE) ×1 IMPLANT
SUT ETHILON 2 0 FS 18 (SUTURE) ×1 IMPLANT
SUT VIC AB 0 CT1 27XBRD ANBCTR (SUTURE) ×1 IMPLANT
SUT VIC AB 1 CT1 27XBRD ANBCTR (SUTURE) ×1 IMPLANT
SUT VIC AB 2-0 CT1 TAPERPNT 27 (SUTURE) ×1 IMPLANT
TOWEL GREEN STERILE (TOWEL DISPOSABLE) ×2 IMPLANT
TOWEL GREEN STERILE FF (TOWEL DISPOSABLE) ×1 IMPLANT
WATER STERILE IRR 1000ML POUR (IV SOLUTION) ×1 IMPLANT

## 2024-01-20 NOTE — Transfer of Care (Signed)
Immediate Anesthesia Transfer of Care Note  Patient: Sara [name]  Procedure(s) Performed: INTRAMEDULLARY (IM) NAIL FEMORAL WITH GRAFT (Left)  Patient Location: PACU  Anesthesia Type:General  Level of Consciousness: awake, alert , patient cooperative, and responds to stimulation  Airway & Oxygen Therapy: Patient Spontanous Breathing and Patient connected to face mask oxygen  Post-op Assessment: Report given to RN and Post -op Vital signs reviewed and stable  Post vital signs: Reviewed and stable  Last Vitals:  Vitals Value Taken Time  BP 123/54 01/20/24 1115  Temp    Pulse 72 01/20/24 1116  Resp 11 01/20/24 1116  SpO2 100 % 01/20/24 1116  Vitals shown include unfiled device data.  Last Pain:  Vitals:   01/20/24 0708  TempSrc:   PainSc: 0-No pain         Complications: No notable events documented.

## 2024-01-20 NOTE — Anesthesia Procedure Notes (Signed)
Procedure Name: Intubation Date/Time: 01/20/2024 8:50 AM  Performed by: Shary Decamp, CRNAPre-anesthesia Checklist: Patient identified, Patient being monitored, Timeout performed, Emergency Drugs available and Suction available Patient Re-evaluated:Patient Re-evaluated prior to induction Oxygen Delivery Method: Circle System Utilized Preoxygenation: Pre-oxygenation with 100% oxygen Induction Type: IV induction Ventilation: Mask ventilation without difficulty Laryngoscope Size: Miller and 2 Grade View: Grade I Tube type: Oral Tube size: 7.0 mm Number of attempts: 1 Airway Equipment and Method: Stylet Placement Confirmation: ETT inserted through vocal cords under direct vision, positive ETCO2 and breath sounds checked- equal and bilateral Secured at: 21 cm Tube secured with: Tape Dental Injury: Teeth and Oropharynx as per pre-operative assessment
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
## 2024-01-20 NOTE — Plan of Care (Signed)

## 2024-01-20 NOTE — Anesthesia Preprocedure Evaluation (Signed)
Anesthesia Evaluation  Patient identified by MRN, date of birth, ID band Patient awake    Reviewed: Allergy & Precautions, NPO status , Patient's Chart, lab work & pertinent test results  Airway Mallampati: II  TM Distance: >3 FB Neck ROM: Full    Dental   Pulmonary former smoker   breath sounds clear to auscultation       Cardiovascular hypertension, Pt. on medications + dysrhythmias  Rhythm:Regular Rate:Normal     Neuro/Psych  Neuromuscular disease    GI/Hepatic negative GI ROS,,,  Endo/Other  Hypothyroidism    Renal/GU Renal disease     Musculoskeletal   Abdominal   Peds  Hematology negative hematology ROS (+)   Anesthesia Other Findings   Reproductive/Obstetrics                             Anesthesia Physical Anesthesia Plan  ASA: 3  Anesthesia Plan: General   Post-op Pain Management: Tylenol PO (pre-op)*   Induction: Intravenous  PONV Risk Score and Plan: 3 and Dexamethasone and Treatment may vary due to age or medical condition  Airway Management Planned: Oral ETT  Additional Equipment: None  Intra-op Plan:   Post-operative Plan: Extubation in OR  Informed Consent: I have reviewed the patients History and Physical, chart, labs and discussed the procedure including the risks, benefits and alternatives for the proposed anesthesia with the patient or authorized representative who has indicated his/her understanding and acceptance.     Dental advisory given  Plan Discussed with: CRNA  Anesthesia Plan Comments:        Anesthesia Quick Evaluation

## 2024-01-21 ENCOUNTER — Other Ambulatory Visit (HOSPITAL_COMMUNITY): Payer: Self-pay

## 2024-01-21 ENCOUNTER — Encounter (HOSPITAL_COMMUNITY): Payer: Self-pay | Admitting: Orthopedic Surgery

## 2024-01-21 DIAGNOSIS — F17290 Nicotine dependence, other tobacco product, uncomplicated: Secondary | ICD-10-CM

## 2024-01-21 DIAGNOSIS — M1731 Unilateral post-traumatic osteoarthritis, right knee: Secondary | ICD-10-CM | POA: Diagnosis not present

## 2024-01-21 DIAGNOSIS — M12561 Traumatic arthropathy, right knee: Secondary | ICD-10-CM | POA: Diagnosis present

## 2024-01-21 HISTORY — DX: Nicotine dependence, other tobacco product, uncomplicated: F17.290

## 2024-01-21 MED ORDER — LIDOCAINE HCL (PF) 2 % IJ SOLN
0.0000 mL | Freq: Once | INTRAMUSCULAR | Status: DC | PRN
Start: 2024-01-21 — End: 2024-01-21
  Filled 2024-01-21 (×2): qty 20

## 2024-01-21 MED ORDER — DOCUSATE SODIUM 100 MG PO CAPS
100.0000 mg | ORAL_CAPSULE | Freq: Two times a day (BID) | ORAL | 0 refills | Status: AC
  Filled 2024-01-21: qty 10, 5d supply, fill #0

## 2024-01-21 MED ORDER — METHOCARBAMOL 500 MG PO TABS
500.0000 mg | ORAL_TABLET | Freq: Three times a day (TID) | ORAL | 0 refills | Status: AC | PRN
  Filled 2024-01-21: qty 60, 20d supply, fill #0

## 2024-01-21 MED ORDER — METHYLPREDNISOLONE ACETATE 40 MG/ML IJ SUSP
40.0000 mg | INTRAMUSCULAR | Status: AC
  Administered 2024-01-21: 40 mg via INTRA_ARTICULAR
  Filled 2024-01-21: qty 1

## 2024-01-21 MED ORDER — ASPIRIN 81 MG PO TBEC
81.0000 mg | DELAYED_RELEASE_TABLET | Freq: Every day | ORAL | 0 refills | Status: AC
  Filled 2024-01-21: qty 30, 30d supply, fill #0

## 2024-01-21 MED ORDER — OXYCODONE-ACETAMINOPHEN 7.5-325 MG PO TABS
1.0000 | ORAL_TABLET | Freq: Three times a day (TID) | ORAL | 0 refills | Status: DC | PRN
  Filled 2024-01-21: qty 30, 5d supply, fill #0

## 2024-01-21 NOTE — Procedures (Cosign Needed)
Clinician: Mearl Latin, PA-C  Specimen: none   Complications: none  Intra-articular medications: 4 cc 2% lidocaine (plain), 1 cc depomedrol (40 mg/mL)  Description  Risks and effects of intra-articular injection were reviewed with the patient.  She wished to proceed.  Verbal consent was obtained.  Utilizing sterile technique the medial joint line of the Right knee was prepped with alcohol swab, followed by betadine swabs x 3.  The skin was anesthetized with Gebauers pain ease spray.  After adequate anesthesia of the skin was achieved, an 23 G needle on a 5 cc syringe was then advanced into the knee joint. A palpable pop was appreciated upon entrance to the knee.  No joint fluid was aspirated.  This syringe containing 4 cc of 2% lidocaine and 1 cc of Depo-Medrol (40 mg/mL) this was advanced into the knee joint with ease.  No resistance was encountered..  Needle removed without difficulty, dressing applied.  Patient tolerated very well.  No issues noted  Pt can participate with therapies as she can tolerate. WBAT, no ROM restrictions.  She can ice for any soreness postinjection as needed  Mearl Latin, PA-C [phone] (C) 01/21/2024, 11:50 AM  [facility] [address] [place] [address] [phone] (F)
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
## 2024-01-21 NOTE — Discharge Instructions (Signed)
[facility] Discharge Instructions   General Discharge Instructions   WEIGHT BEARING STATUS: Weightbearing as tolerated left leg with assistance.  May need to use walker for a few weeks  RANGE OF MOTION/ACTIVITY: Unrestricted range of motion of left knee.  Activity as tolerated  Bone health: vitamin d levels look excellent  Review the following resource for additional information regarding bone health  [email or web address].com.cy  Wound Care: Daily wound care as needed starting on 01/23/2024.  Okay to leave incisions open to the air once there is no drainage.  You can cover with 4 x 4 gauze and tape or a silicone foam dressing such as a Mepilex.  Once there is no drainage you can shower and clean wounds with soap and water only   Discharge Wound Care Instructions  Do NOT apply any ointments, solutions or lotions to pin sites or surgical wounds.  These prevent needed drainage and even though solutions like hydrogen peroxide kill bacteria, they also damage cells lining the pin sites that help fight infection.  Applying lotions or ointments can keep the wounds moist and can cause them to breakdown and open up as well. This can increase the risk for infection. When in doubt call the office.  Surgical incisions should be dressed daily.  If any drainage is noted, use one layer of adaptic or Mepitel, then gauze and Ace wrap.  You can use an Ace wrap if there is significant swelling otherwise you do not need to use one.  Alternatively you can use a compression sock.  NetCamper.cz https://dennis-soto.com/?pd_rd_i=[email or web address]&th=1  http://rojas.com/  These dressing supplies should be available at local medical supply stores ([employer], [employer], etc). They are  not usually carried at places like [facility], [facility], [facility], etc  Once the incision is completely dry and without drainage, it may be left open to air out.  Showering may begin 36-48 hours later.  Cleaning gently with soap and water.  DVT/PE prophylaxis: Aspirin 81 mg every 12 hours x 30 days for blood clot prevention   Diet: as you were eating previously.  Can use over the counter stool softeners and bowel preparations, such as Miralax, to help with bowel movements.  Narcotics can be constipating.  Be sure to drink plenty of fluids  PAIN MEDICATION USE AND EXPECTATIONS  You have likely been given narcotic medications to help control your pain.  After a traumatic event that results in an fracture (broken bone) with or without surgery, it is ok to use narcotic pain medications to help control one's pain.  We understand that everyone responds to pain differently and each individual patient will be evaluated on a regular basis for the continued need for narcotic medications. Ideally, narcotic medication use should last no more than 6-8 weeks (coinciding with fracture healing).   As a patient it is your responsibility as well to monitor narcotic medication use and report the amount and frequency you use these medications when you come to your office visit.   We would also advise that if you are using narcotic medications, you should take a dose prior to therapy to maximize you participation.  IF YOU ARE ON NARCOTIC MEDICATIONS IT IS NOT PERMISSIBLE TO OPERATE A MOTOR VEHICLE (MOTORCYCLE/CAR/TRUCK/MOPED) OR HEAVY MACHINERY DO NOT MIX NARCOTICS WITH OTHER CNS (CENTRAL NERVOUS SYSTEM) DEPRESSANTS SUCH AS ALCOHOL   POST-OPERATIVE OPIOID TAPER INSTRUCTIONS: It is important to wean off of your opioid medication as soon as possible. If you do not need pain medication after your surgery  it is ok to stop day one. Opioids include: Codeine, Hydrocodone(Norco, Vicodin), Oxycodone(Percocet, oxycontin) and  hydromorphone amongst others.  Long term and even short term use of opiods can cause: Increased pain response Dependence Constipation Depression Respiratory depression And more.  Withdrawal symptoms can include Flu like symptoms Nausea, vomiting And more Techniques to manage these symptoms Hydrate well Eat regular healthy meals Stay active Use relaxation techniques(deep breathing, meditating, yoga) Do Not substitute Alcohol to help with tapering If you have been on opioids for less than two weeks and do not have pain than it is ok to stop all together.  Plan to wean off of opioids This plan should start within one week post op of your fracture surgery  Maintain the same interval or time between taking each dose and first decrease the dose.  Cut the total daily intake of opioids by one tablet each day Next start to increase the time between doses. The last dose that should be eliminated is the evening dose.    STOP SMOKING OR USING NICOTINE PRODUCTS!!!!  As discussed nicotine severely impairs your body's ability to heal surgical and traumatic wounds but also impairs bone healing.  Wounds and bone heal by forming microscopic blood vessels (angiogenesis) and nicotine is a vasoconstrictor (essentially, shrinks blood vessels).  Therefore, if vasoconstriction occurs to these microscopic blood vessels they essentially disappear and are unable to deliver necessary nutrients to the healing tissue.  This is one modifiable factor that you can do to dramatically increase your chances of healing your injury.    (This means no smoking, no nicotine gum, patches, etc)  DO NOT USE NONSTEROIDAL ANTI-INFLAMMATORY DRUGS (NSAID'S)  Using products such as Advil (ibuprofen), Aleve (naproxen), Motrin (ibuprofen) for additional pain control during fracture healing can delay and/or prevent the healing response.  If you would like to take over the counter (OTC) medication, Tylenol (acetaminophen) is ok.   However, some narcotic medications that are given for pain control contain acetaminophen as well. Therefore, you should not exceed more than 4000 mg of tylenol in a day if you do not have liver disease.  Also note that there are may OTC medicines, such as cold medicines and allergy medicines that my contain tylenol as well.  If you have any questions about medications and/or interactions please ask your doctor/PA or your pharmacist.      ICE AND ELEVATE INJURED/OPERATIVE EXTREMITY  Using ice and elevating the injured extremity above your heart can help with swelling and pain control.  Icing in a pulsatile fashion, such as 20 minutes on and 20 minutes off, can be followed.    Do not place ice directly on skin. Make sure there is a barrier between to skin and the ice pack.    Using frozen items such as frozen peas works well as the conform nicely to the are that needs to be iced.  USE AN ACE WRAP OR TED HOSE FOR SWELLING CONTROL  In addition to icing and elevation, Ace wraps or TED hose are used to help limit and resolve swelling.  It is recommended to use Ace wraps or TED hose until you are informed to stop.    When using Ace Wraps start the wrapping distally (farthest away from the body) and wrap proximally (closer to the body)   Example: If you had surgery on your leg or thing and you do not have a splint on, start the ace wrap at the toes and work your way up to the thigh  If you had surgery on your upper extremity and do not have a splint on, start the ace wrap at your fingers and work your way up to the upper arm  IF YOU ARE IN A SPLINT OR CAST DO NOT REMOVE IT FOR ANY REASON   If your splint gets wet for any reason please contact the office immediately. You may shower in your splint or cast as long as you keep it dry.  This can be done by wrapping in a cast cover or garbage back (or similar)  Do Not stick any thing down your splint or cast such as pencils, money, or hangers to try and  scratch yourself with.  If you feel itchy take benadryl as prescribed on the bottle for itching  IF YOU ARE IN A CAM BOOT (BLACK BOOT)  You may remove boot periodically. Perform daily dressing changes as noted below.  Wash the liner of the boot regularly and wear a sock when wearing the boot. It is recommended that you sleep in the boot until told otherwise    Call office for the following: Temperature greater than 101F Persistent nausea and vomiting Severe uncontrolled pain Redness, tenderness, or signs of infection (pain, swelling, redness, odor or green/yellow discharge around the site) Difficulty breathing, headache or visual disturbances Hives Persistent dizziness or light-headedness Extreme fatigue Any other questions or concerns you may have after discharge  In an emergency, call 911 or go to an Emergency Department at a nearby hospital  HELPFUL INFORMATION  If you had a block, it will wear off between 8-24 hrs postop typically.  This is period when your pain may go from nearly zero to the pain you would have had postop without the block.  This is an abrupt transition but nothing dangerous is happening.  You may take an extra dose of narcotic when this happens.  You should wean off your narcotic medicines as soon as you are able.  Most patients will be off or using minimal narcotics before their first postop appointment.   We suggest you use the pain medication the first night prior to going to bed, in order to ease any pain when the anesthesia wears off. You should avoid taking pain medications on an empty stomach as it will make you nauseous.  Do not drink alcoholic beverages or take illicit drugs when taking pain medications.  In most states it is against the law to drive while you are in a splint or sling.  And certainly against the law to drive while taking narcotics.  You may return to work/school in the next couple of days when you feel up to it.   Pain medication may  make you constipated.  Below are a few solutions to try in this order: Decrease the amount of pain medication if you aren't having pain. Drink lots of decaffeinated fluids. Drink prune juice and/or each dried prunes  If the first 3 don't work start with additional solutions Take Colace - an over-the-counter stool softener Take Senokot - an over-the-counter laxative Take Miralax - a stronger over-the-counter laxative     CALL THE OFFICE WITH ANY QUESTIONS OR CONCERNS: [phone]   VISIT OUR WEBSITE FOR ADDITIONAL INFORMATION: [email or web address]

## 2024-01-21 NOTE — Plan of Care (Signed)
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
## 2024-01-21 NOTE — Anesthesia Postprocedure Evaluation (Signed)
Anesthesia Post Note  Patient: Sara [name]  Procedure(s) Performed: INTRAMEDULLARY (IM) NAIL FEMORAL WITH GRAFT (Left)     Patient location during evaluation: PACU Anesthesia Type: General Level of consciousness: awake and alert Pain management: pain level controlled Vital Signs Assessment: post-procedure vital signs reviewed and stable Respiratory status: spontaneous breathing, nonlabored ventilation, respiratory function stable and patient connected to nasal cannula oxygen Cardiovascular status: blood pressure returned to baseline and stable Postop Assessment: no apparent nausea or vomiting Anesthetic complications: no   No notable events documented.  Last Vitals:  Vitals:   01/21/24 0427 01/21/24 0732  BP: (!) 94/54 (!) 90/48  Pulse: 64 62  Resp: 16 16  Temp: 36.7 C (!) 36.4 C  SpO2: 99% 100%    Last Pain:  Vitals:   01/21/24 0815  TempSrc:   PainSc: 6                  Kennieth Rad

## 2024-01-21 NOTE — TOC CM/SW Note (Signed)
PT recommendations for OP PT. Notes not in yet. Secure chatted PT/OT . Patient being discharged states she has all DME . Patient prefers OP PT at [facility] . Ordered entered asked PA to sign .

## 2024-01-23 ENCOUNTER — Encounter (HOSPITAL_COMMUNITY): Payer: Self-pay | Admitting: Orthopedic Surgery

## 2024-01-25 ENCOUNTER — Encounter: Payer: Self-pay | Admitting: Cardiology

## 2024-01-25 LAB — AEROBIC/ANAEROBIC CULTURE W GRAM STAIN (SURGICAL/DEEP WOUND)
Culture: NO GROWTH
Gram Stain: NONE SEEN

## 2024-01-28 ENCOUNTER — Other Ambulatory Visit: Payer: Self-pay | Admitting: Cardiology

## 2024-01-28 MED ORDER — FLUCONAZOLE 150 MG PO TABS: ORAL_TABLET | ORAL | 0 refills | Status: DC

## 2024-01-28 MED ORDER — NITROFURANTOIN MONOHYD MACRO 100 MG PO CAPS: 100.0000 mg | ORAL_CAPSULE | Freq: Two times a day (BID) | ORAL | 0 refills | Status: AC

## 2024-02-02 ENCOUNTER — Ambulatory Visit: Payer: Medicaid Other | Admitting: Cardiology

## 2024-02-05 ENCOUNTER — Encounter: Payer: Self-pay | Admitting: Cardiology

## 2024-02-05 ENCOUNTER — Ambulatory Visit: Payer: Medicaid Other | Admitting: Cardiology

## 2024-02-05 ENCOUNTER — Other Ambulatory Visit: Payer: Self-pay | Admitting: Cardiology

## 2024-02-05 VITALS — BP 96/58 | HR 88 | Ht 65.0 in | Wt 194.0 lb

## 2024-02-05 DIAGNOSIS — Z131 Encounter for screening for diabetes mellitus: Secondary | ICD-10-CM | POA: Diagnosis not present

## 2024-02-05 DIAGNOSIS — Z0130 Encounter for examination of blood pressure without abnormal findings: Secondary | ICD-10-CM

## 2024-02-05 DIAGNOSIS — Z713 Dietary counseling and surveillance: Secondary | ICD-10-CM

## 2024-02-05 DIAGNOSIS — Z13220 Encounter for screening for lipoid disorders: Secondary | ICD-10-CM

## 2024-02-05 DIAGNOSIS — E66811 Obesity, class 1: Secondary | ICD-10-CM | POA: Diagnosis not present

## 2024-02-05 DIAGNOSIS — Z1329 Encounter for screening for other suspected endocrine disorder: Secondary | ICD-10-CM | POA: Diagnosis not present

## 2024-02-05 NOTE — Progress Notes (Signed)
 Established Patient Office Visit  Subjective:  Patient ID: Sara [name], female    DOB: 03-12-1966  Age: 58 y.o. MRN: [id number]  Chief Complaint  Patient presents with   Follow-up    2 Month Follow Up    Patient in office for two month follow up. Patient did not have blood work done, is fasting today. Patient doing well, no complaints today. Patient recovering from recent leg surgery. No change in medications.     No other concerns at this time.   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    Past Surgical History:  Procedure Laterality Date   COLON SURGERY     COLONOSCOPY WITH PROPOFOL  N/A 03/25/2022   Procedure: COLONOSCOPY WITH PROPOFOL ;  Surgeon: Unk Corinn Skiff, MD;  Location: [facility];  Service: Gastroenterology;  Laterality: N/A;   COLOSTOMY REVERSAL N/A 03/27/2022   Procedure: COLOSTOMY TAKEDOWN VENTRAL HERNIA REPAIR;  Surgeon: Lyndel Deward PARAS, MD;  Location: [facility];  Service: General;  Laterality: N/A;   FEMUR IM NAIL Left 01/20/2024   Procedure: INTRAMEDULLARY (IM) NAIL FEMORAL WITH GRAFT;  Surgeon: Celena Sharper, MD;  Location: [facility] OR;  Service: Orthopedics;  Laterality: Left;   I & D EXTREMITY Left 11/06/2020   Procedure: IRRIGATION AND DEBRIDEMENT EXTREMITY;  Surgeon: Celena Sharper, MD;  [facility];  Service: Orthopedics;  Laterality: Left;   IR CATHETER TUBE CHANGE  11/15/2020   IRRIGATION AND DEBRIDEMENT KNEE  09/22/2020   Procedure: IRRIGATION AND DEBRIDEMENT LEFT KNEE PLACEMENT OF EXTERNAL FIXATION,;  Surgeon: Sharl Selinda Dover, MD;  Location: [facility] OR;  Service: Orthopedics;;   LAPAROTOMY N/A 09/24/2020   Procedure: EXPLORATORY LAPAROTOMY COLOSTOMY   AND REPAIR  FLANK HERNIA;  Surgeon: Signe Mitzie DELENA, MD;  Location: [facility];  Service: General;  Laterality: N/A;   LAPAROTOMY N/A 09/22/2020   Procedure: EXPLORATORY LAPAROTOMY, Ileocecectomy;  Surgeon: Signe Mitzie DELENA, MD;  Location: [facility];  Service: General;  Laterality: N/A;   OPEN REDUCTION INTERNAL FIXATION (ORIF) DISTAL RADIAL FRACTURE Right 09/26/2020   Procedure: OPEN REDUCTION INTERNAL FIXATION (ORIF) DISTAL RADIUS FRACTURE;  Surgeon: Celena Sharper, MD;  Location: [facility];  Service: Orthopedics;  Laterality: Right;   ORIF FEMUR FRACTURE Left 11/02/2020   Procedure: INCISION DRAINAGE DEEP WOUND LEFT LEG, APPLICATION OF WOUND VAC;  Surgeon: Celena Sharper, MD;  Location: [facility];  Service: Orthopedics;  Laterality: Left;   ORIF FEMUR FRACTURE Left 12/07/2020   Procedure: OPEN REDUCTION INTERNAL FIXATION (ORIF) DISTAL FEMUR FRACTURE : REPAIR OF LEFT DISTAL FEMUR NONUNION;  Surgeon: Celena Sharper, MD;  Location[facility];  Service: Orthopedics;  Laterality: Left;   ORIF FEMUR FRACTURE Left 01/24/2022   Procedure: REPAIR LEFT FEMUR NONUNION, QUADRECEPLASTY;  Surgeon: Celena Sharper, MD;  Location[facility];  Service: Orthopedics;  Laterality: Left;   ORIF TIBIA PLATEAU Bilateral 09/26/2020   Procedure: OPEN REDUCTION INTERNAL FIXATION (ORIF) RIGHT DISTAL FEMUR, RIGHT CALCANEUS, LEFT DISTAL FEMUR, LEFT TIBIAL PLATEAU FRACTURE.  IRRIGATION AND DEBRIDEMENT LEFT LEG; REMOVAL OF EXTERNAL FIXATOR LEFT LEG;  Surgeon: Celena Sharper, MD;  Location: [facility];  Service: Orthopedics;  Laterality:  Bilateral;    Social History   Socioeconomic History   Marital status: Single    Spouse name: Not on file   Number of children: Not on file   Years of education: Not on file   Highest education level: Not on file  Occupational History   Not on file  Tobacco Use   Smoking status: Former    Types: Cigarettes   Smokeless tobacco: Former   Tobacco comments:    Quit smoking cigarettes in 2021.  Vaping Use   Vaping  status: Every Day   Substances: Nicotine, CBD, Flavoring  Substance and Sexual Activity   Alcohol use: Yes    Comment: occas.   Drug use: Yes    Types: Marijuana    Comment: Last use in  10/2023   Sexual activity: Not Currently    Birth control/protection: I.U.D.    Comment: Mirena  IUD  Other Topics Concern   Not on file  Social History Narrative   ** Merged History Encounter **       Social Drivers of Health   Financial Resource Strain: Not on file  Food Insecurity: No Food Insecurity (01/20/2024)   Hunger Vital Sign    Worried About Running Out of Food in the Last Year: Never true    Ran Out of Food in the Last Year: Never true  Transportation Needs: No Transportation Needs (01/20/2024)   PRAPARE - Administrator, Civil Service (Medical): No    Lack of Transportation (Non-Medical): No  Physical Activity: Not on file  Stress: Not on file  Social Connections: Not on file  Intimate Partner Violence: Not At Risk (01/20/2024)   Humiliation, Afraid, Rape, and Kick questionnaire    Fear of Current or Ex-Partner: No    Emotionally Abused: No    Physically Abused: No    Sexually Abused: No    Family History  Problem Relation Age of Onset   Breast cancer Maternal Aunt        70/80s   Cancer Maternal Aunt        unsure if uterine or ovarian    Allergies  Allergen Reactions   Azithromycin Diarrhea and Nausea And Vomiting   Benzalkonium Chloride Other (See Comments)    Pt unsure of allergy    Gabapentin  Other (See Comments)    Patient states it makes her loopy   Augmentin [Amoxicillin-Pot Clavulanate] Nausea Only   Ciprofloxacin Nausea Only   Neomycin  Rash   Neosporin [Bacitracin-Polymyxin B] Itching and Rash   Neosporin [Neomycin -Bacitracin Zn-Polymyx] Rash   Septra [Sulfamethoxazole-Trimethoprim] Nausea Only    Outpatient Medications Prior to Visit  Medication Sig   ACAI BERRY PO Take 1 capsule by mouth daily.   acidophilus (RISAQUAD) CAPS capsule  Take 1 capsule by mouth daily.   albuterol  (VENTOLIN  HFA) 108 (90 Base) MCG/ACT inhaler Inhale 2 puffs into the lungs every 6 (six) hours as needed for wheezing or shortness of breath.   aspirin  EC 81 MG tablet Take 1 tablet (81 mg total) by mouth daily. Swallow whole.   Cholecalciferol  (VITAMIN D3 PO) Take 1 tablet by mouth daily.   docusate sodium  (COLACE) 100 MG capsule Take 1 capsule (100 mg total) by mouth 2 (two) times daily.   fluticasone  (FLONASE ) 50 MCG/ACT nasal spray Place 1 spray into both nostrils daily.   levonorgestrel  (MIRENA ) 20 MCG/DAY IUD 1 each by Intrauterine route once.   methocarbamol  (ROBAXIN ) 500 MG tablet Take 1 tablet (500 mg total) by mouth 3 (three)  times daily as needed for muscle spasms.   Omega-3 Fatty Acids (FISH OIL PO) Take 1 capsule by mouth daily.   ondansetron  (ZOFRAN ) 4 MG tablet TAKE 1 TABLET BY MOUTH EVERY 8 HOURS AS NEEDED FOR NAUSEA OR VOMITING   oxyCODONE -acetaminophen  (PERCOCET) 7.5-325 MG tablet Take 1-2 tablets by mouth every 8 (eight) hours as needed for severe pain (pain score 7-10).   spironolactone  (ALDACTONE ) 25 MG tablet Take 1 tablet (25 mg total) by mouth daily.   tirzepatide  (ZEPBOUND ) 7.5 MG/0.5ML Pen Inject 7.5 mg into the skin once a week.   [DISCONTINUED] cephALEXin  (KEFLEX ) 500 MG capsule Take 1 capsule (500 mg total) by mouth 4 (four) times daily.   [DISCONTINUED] fluconazole  (DIFLUCAN ) 150 MG tablet Take one tablet every 72 hours (Patient not taking: Reported on 02/05/2024)   [DISCONTINUED] methylPREDNISolone  (MEDROL  DOSEPAK) 4 MG TBPK tablet As directed   [DISCONTINUED] nystatin  (MYCOSTATIN /NYSTOP ) powder Apply 1 Application topically 3 (three) times daily. (Patient not taking: Reported on 01/13/2024)   [DISCONTINUED] TRINTELLIX  20 MG TABS tablet TAKE 1 TABLET BY MOUTH ONCE DAILY (Patient taking differently: Take 20 mg by mouth every morning.)   No facility-administered medications prior to visit.    Review of Systems   Constitutional: Negative.   HENT: Negative.    Eyes: Negative.   Respiratory: Negative.  Negative for shortness of breath.   Cardiovascular: Negative.  Negative for chest pain.  Gastrointestinal: Negative.  Negative for abdominal pain, constipation and diarrhea.  Genitourinary: Negative.   Musculoskeletal:  Negative for joint pain and myalgias.  Skin: Negative.   Neurological: Negative.  Negative for dizziness and headaches.  Endo/Heme/Allergies: Negative.   All other systems reviewed and are negative.      Objective:   BP (!) 96/58   Pulse 88   Ht 5' 5 (1.651 m)   Wt 194 lb (88 kg)   SpO2 91%   BMI 32.28 kg/m   Vitals:   02/05/24 1436  BP: (!) 96/58  Pulse: 88  Height: 5' 5 (1.651 m)  Weight: 194 lb (88 kg)  SpO2: 91%  BMI (Calculated): 32.28    Physical Exam Vitals and nursing note reviewed.  Constitutional:      Appearance: Normal appearance. She is normal weight.  HENT:     Head: Normocephalic and atraumatic.     Nose: Nose normal.     Mouth/Throat:     Mouth: Mucous membranes are moist.  Eyes:     Extraocular Movements: Extraocular movements intact.     Conjunctiva/sclera: Conjunctivae normal.     Pupils: Pupils are equal, round, and reactive to light.  Cardiovascular:     Rate and Rhythm: Normal rate and regular rhythm.     Pulses: Normal pulses.     Heart sounds: Normal heart sounds.  Pulmonary:     Effort: Pulmonary effort is normal.     Breath sounds: Normal breath sounds.  Abdominal:     General: Abdomen is flat. Bowel sounds are normal.     Palpations: Abdomen is soft.  Musculoskeletal:        General: Normal range of motion.     Cervical back: Normal range of motion.  Skin:    General: Skin is warm and dry.  Neurological:     General: No focal deficit present.     Mental Status: She is alert and oriented to person, place, and time.  Psychiatric:        Mood and Affect: Mood normal.  Behavior: Behavior normal.        Thought  Content: Thought content normal.        Judgment: Judgment normal.      No results found for any visits on 02/05/24.  Recent Results (from the past 2160 hours)  Pregnancy, urine POC     Status: None   Collection Time: 01/20/24  7:33 AM  Result Value Ref Range   Preg Test, Ur NEGATIVE NEGATIVE    Comment:        THE SENSITIVITY OF THIS METHODOLOGY IS >24 mIU/mL   Urinalysis, Routine w reflex microscopic -Urine, Clean Catch     Status: Abnormal   Collection Time: 01/20/24  7:34 AM  Result Value Ref Range   Color, Urine Rembert Browe (A) YELLOW    Comment: BIOCHEMICALS MAY BE AFFECTED BY COLOR   APPearance CLOUDY (A) CLEAR   Specific Gravity, Urine 1.027 1.005 - 1.030   pH 5.0 5.0 - 8.0   Glucose, UA NEGATIVE NEGATIVE mg/dL   Hgb urine dipstick LARGE (A) NEGATIVE   Bilirubin Urine NEGATIVE NEGATIVE   Ketones, ur NEGATIVE NEGATIVE mg/dL   Protein, ur 30 (A) NEGATIVE mg/dL   Nitrite NEGATIVE NEGATIVE   Leukocytes,Ua SMALL (A) NEGATIVE   RBC / HPF 6-10 0 - 5 RBC/hpf   WBC, UA 21-50 0 - 5 WBC/hpf   Bacteria, UA RARE (A) NONE SEEN   Squamous Epithelial / HPF 6-10 0 - 5 /HPF   Mucus PRESENT    Hyaline Casts, UA PRESENT    Granular Casts, UA PRESENT    Ca Oxalate Crys, UA PRESENT     Comment: Performed at [facility], [address], [place], [place] [address]  VITAMIN D  25 Hydroxy (Vit-D Deficiency, Fractures)     Status: None   Collection Time: 01/20/24  7:38 AM  Result Value Ref Range   Vit D, 25-Hydroxy 82.89 30 - 100 ng/mL    Comment: (NOTE) Vitamin D  deficiency has been defined by the Institute of Medicine  and an Endocrine Society practice guideline as a level of serum 25-OH  vitamin D  less than 20 ng/mL (1,2). The Endocrine Society went on to  further define vitamin D  insufficiency as a level between 21 and 29  ng/mL (2).  1. IOM (Institute of Medicine). 2010. Dietary reference intakes for  calcium  and D. [place]: [employer]. 2. Holick MF,  Binkley [place], Bischoff-Ferrari HA, et al. Evaluation,  treatment, and prevention of vitamin D  deficiency: an Endocrine  Society clinical practice guideline, JCEM. 2011 Jul; 96(7): 1911-30.  Performed at [facility], [address], [place], [place] [address]   C-reactive protein     Status: Abnormal   Collection Time: 01/20/24  7:38 AM  Result Value Ref Range   CRP 1.2 (H) <1.0 mg/dL    Comment: Performed at [facility], [address], [place], [place] [address]  CBC with Differential/Platelet     Status: Abnormal   Collection Time: 01/20/24  8:18 AM  Result Value Ref Range   WBC 10.7 (H) 4.0 - 10.5 K/uL   RBC 4.86 3.87 - 5.11 MIL/uL   Hemoglobin 14.6 12.0 - 15.0 g/dL   HCT 54.1 63.9 - 53.9 %   MCV 94.2 80.0 - 100.0 fL   MCH 30.0 26.0 - 34.0 pg   MCHC 31.9 30.0 - 36.0 g/dL   RDW 86.0 88.4 - 84.4 %   Platelets 194 150 - 400 K/uL  nRBC 0.0 0.0 - 0.2 %   Neutrophils Relative % 69 %   Neutro Abs 7.4 1.7 - 7.7 K/uL   Lymphocytes Relative 18 %   Lymphs Abs 1.9 0.7 - 4.0 K/uL   Monocytes Relative 10 %   Monocytes Absolute 1.1 (H) 0.1 - 1.0 K/uL   Eosinophils Relative 1 %   Eosinophils Absolute 0.1 0.0 - 0.5 K/uL   Basophils Relative 1 %   Basophils Absolute 0.1 0.0 - 0.1 K/uL   Immature Granulocytes 1 %   Abs Immature Granulocytes 0.06 0.00 - 0.07 K/uL    Comment: Performed at [facility], [address] [place], [place] [address]  Sedimentation rate     Status: None   Collection Time: 01/20/24  8:18 AM  Result Value Ref Range   Sed Rate 6 0 - 22 mm/hr    Comment: Performed at [facility], [address], [place], [place] [address]  I-STAT, nathanael 8     Status: Abnormal   Collection Time: 01/20/24  8:27 AM  Result Value Ref Range   Sodium 140 135 - 145 mmol/L   Potassium 3.6 3.5 - 5.1 mmol/L   Chloride 103 98 - 111 mmol/L   BUN 17 6 - 20 mg/dL   Creatinine, Ser 8.89 (H) 0.44 - 1.00 mg/dL   Glucose, Bld 91 70 - 99 mg/dL    Comment: Glucose  reference range applies only to samples taken after fasting for at least 8 hours.   Calcium , Ion 1.09 (L) 1.15 - 1.40 mmol/L   TCO2 31 22 - 32 mmol/L   Hemoglobin 15.3 (H) 12.0 - 15.0 g/dL   HCT 54.9 63.9 - 53.9 %  Aerobic/Anaerobic Culture w Gram Stain (surgical/deep wound)     Status: None   Collection Time: 01/20/24  9:39 AM   Specimen: PATH Soft tissue  Result Value Ref Range   Specimen Description TISSUE    Special Requests left leg non union site    Gram Stain NO WBC SEEN NO ORGANISMS SEEN     Culture      No growth aerobically or anaerobically. Performed at [facility], [address], [place], [place] [address]    Report Status 01/25/2024 FINAL   Protime-INR     Status: None   Collection Time: 01/20/24  3:33 PM  Result Value Ref Range   Prothrombin Time 13.1 11.4 - 15.2 seconds   INR 1.0 0.8 - 1.2    Comment: (NOTE) INR goal varies based on device and disease states. Performed at [facility], [address], [place], [place] [address]       Assessment & Plan:  Fasting lab work today. Continue same medications.   Problem [name] Items Addressed This Visit       Other   Weight loss counseling, encounter for   Obesity (BMI 30.0-34.9) - Primary   Other Visit Diagnoses       Diabetes mellitus screening       Relevant Orders   Hemoglobin A1c   CMP14+EGFR     Thyroid disorder screening       Relevant Orders   TSH     Lipid screening       Relevant Orders   Lipid Profile       Return in about 4 months (around 06/04/2024) for with fasting labs prior.   Total time spent: 25 minutes  [employer], NP  02/05/2024   This document may have  been prepared by [employer] and as such may include unintentional dictation errors.
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
## 2024-02-08 NOTE — Brief Op Note (Addendum)
 01/20/2024  10:28 AM  PATIENT:  Sara [name]  58 y.o. female  PRE-OPERATIVE DIAGNOSIS:  NONUNION OPEN LEFT SUPRACONDYLAR FEMUR FRACTURE  #

## 2024-02-09 ENCOUNTER — Other Ambulatory Visit: Payer: Self-pay | Admitting: Cardiology

## 2024-03-19 ENCOUNTER — Other Ambulatory Visit: Payer: Self-pay | Admitting: Family

## 2024-03-19 ENCOUNTER — Other Ambulatory Visit: Payer: Self-pay | Admitting: Cardiology

## 2024-03-25 ENCOUNTER — Encounter: Payer: Self-pay | Admitting: Cardiology

## 2024-03-25 ENCOUNTER — Other Ambulatory Visit: Payer: Self-pay

## 2024-04-02 ENCOUNTER — Encounter: Payer: Self-pay | Admitting: Cardiology

## 2024-04-02 ENCOUNTER — Ambulatory Visit: Admitting: Cardiology

## 2024-04-02 DIAGNOSIS — R300 Dysuria: Secondary | ICD-10-CM

## 2024-04-02 LAB — POCT URINALYSIS DIPSTICK
Glucose, UA: NEGATIVE
Ketones, UA: NEGATIVE
Nitrite, UA: NEGATIVE
Protein, UA: POSITIVE — AB
Spec Grav, UA: 1.025 (ref 1.010–1.025)
Urobilinogen, UA: 0.2 U/dL
pH, UA: 6 (ref 5.0–8.0)

## 2024-04-02 MED ORDER — NITROFURANTOIN MONOHYD MACRO 100 MG PO CAPS: 100.0000 mg | ORAL_CAPSULE | Freq: Two times a day (BID) | ORAL | 0 refills | Status: AC

## 2024-04-05 ENCOUNTER — Other Ambulatory Visit: Payer: Self-pay

## 2024-04-05 DIAGNOSIS — R300 Dysuria: Secondary | ICD-10-CM

## 2024-04-08 ENCOUNTER — Other Ambulatory Visit: Payer: Self-pay | Admitting: Cardiology

## 2024-04-08 LAB — URINE CULTURE

## 2024-04-08 MED ORDER — LEVOFLOXACIN 250 MG PO TABS: 250.0000 mg | ORAL_TABLET | Freq: Every day | ORAL | 0 refills | Status: AC

## 2024-04-14 ENCOUNTER — Other Ambulatory Visit: Payer: Self-pay | Admitting: Family

## 2024-04-14 ENCOUNTER — Other Ambulatory Visit: Payer: Self-pay | Admitting: Cardiology

## 2024-04-16 ENCOUNTER — Encounter: Payer: Self-pay | Admitting: Cardiology

## 2024-04-24 ENCOUNTER — Other Ambulatory Visit: Payer: Self-pay | Admitting: Cardiology

## 2024-04-28 ENCOUNTER — Other Ambulatory Visit: Payer: Self-pay | Admitting: Orthopedic Surgery

## 2024-05-10 ENCOUNTER — Encounter (HOSPITAL_BASED_OUTPATIENT_CLINIC_OR_DEPARTMENT_OTHER): Payer: Self-pay | Admitting: Orthopedic Surgery

## 2024-05-12 ENCOUNTER — Encounter (HOSPITAL_BASED_OUTPATIENT_CLINIC_OR_DEPARTMENT_OTHER)
Admission: RE | Admit: 2024-05-12 | Discharge: 2024-05-12 | Disposition: A | Discharge status: Home / Self Care | Source: Ambulatory Visit | Attending: Orthopedic Surgery | Admitting: Orthopedic Surgery

## 2024-05-12 DIAGNOSIS — Z87891 Personal history of nicotine dependence: Secondary | ICD-10-CM | POA: Diagnosis not present

## 2024-05-12 DIAGNOSIS — F1290 Cannabis use, unspecified, uncomplicated: Secondary | ICD-10-CM | POA: Diagnosis not present

## 2024-05-12 DIAGNOSIS — G5621 Lesion of ulnar nerve, right upper limb: Secondary | ICD-10-CM | POA: Diagnosis present

## 2024-05-12 DIAGNOSIS — I10 Essential (primary) hypertension: Secondary | ICD-10-CM | POA: Diagnosis not present

## 2024-05-12 LAB — BASIC METABOLIC PANEL WITH GFR
Anion gap: 10 (ref 5–15)
BUN: 11 mg/dL (ref 6–20)
CO2: 26 mmol/L (ref 22–32)
Calcium: 8.9 mg/dL (ref 8.9–10.3)
Chloride: 107 mmol/L (ref 98–111)
Creatinine, Ser: 1.03 mg/dL — ABNORMAL HIGH (ref 0.44–1.00)
GFR, Estimated: >60 mL/min (ref >60)
Glucose, Bld: 89 mg/dL (ref 70–99)
Potassium: 3.7 mmol/L (ref 3.5–5.1)
Sodium: 143 mmol/L (ref 135–145)
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
## 2024-05-12 NOTE — Progress Notes (Signed)

## 2024-05-13 ENCOUNTER — Ambulatory Visit (HOSPITAL_BASED_OUTPATIENT_CLINIC_OR_DEPARTMENT_OTHER): Payer: Self-pay | Admitting: Anesthesiology

## 2024-05-13 ENCOUNTER — Other Ambulatory Visit: Payer: Self-pay

## 2024-05-13 ENCOUNTER — Other Ambulatory Visit: Payer: Self-pay | Admitting: Cardiology

## 2024-05-13 ENCOUNTER — Encounter (HOSPITAL_BASED_OUTPATIENT_CLINIC_OR_DEPARTMENT_OTHER)
Admission: RE | Disposition: A | Discharge status: Home / Self Care | Payer: Self-pay | Source: Home / Self Care | Attending: Orthopedic Surgery

## 2024-05-13 ENCOUNTER — Ambulatory Visit (HOSPITAL_BASED_OUTPATIENT_CLINIC_OR_DEPARTMENT_OTHER)
Admission: RE | Admit: 2024-05-13 | Discharge: 2024-05-13 | Disposition: A | Discharge status: Home / Self Care | Attending: Orthopedic Surgery | Admitting: Orthopedic Surgery

## 2024-05-13 ENCOUNTER — Encounter (HOSPITAL_BASED_OUTPATIENT_CLINIC_OR_DEPARTMENT_OTHER): Payer: Self-pay | Admitting: Orthopedic Surgery

## 2024-05-13 DIAGNOSIS — I10 Essential (primary) hypertension: Secondary | ICD-10-CM

## 2024-05-13 DIAGNOSIS — G5621 Lesion of ulnar nerve, right upper limb: Secondary | ICD-10-CM | POA: Diagnosis not present

## 2024-05-13 DIAGNOSIS — Z87891 Personal history of nicotine dependence: Secondary | ICD-10-CM

## 2024-05-13 DIAGNOSIS — E039 Hypothyroidism, unspecified: Secondary | ICD-10-CM

## 2024-05-13 DIAGNOSIS — E876 Hypokalemia: Secondary | ICD-10-CM

## 2024-05-13 DIAGNOSIS — F1290 Cannabis use, unspecified, uncomplicated: Secondary | ICD-10-CM | POA: Insufficient documentation

## 2024-05-13 LAB — POCT PREGNANCY, URINE: Preg Test, Ur: NEGATIVE

## 2024-05-13 SURGERY — NERVE TUNNEL DECOMPRESSION, UPPER EXTREMITY
Anesthesia: General | Site: Arm Lower | Laterality: Right

## 2024-05-13 MED ORDER — LIDOCAINE 2% (20 MG/ML) 5 ML SYRINGE
INTRAMUSCULAR | Status: DC | PRN
  Administered 2024-05-13: 80 mg via INTRAVENOUS

## 2024-05-13 MED ORDER — FENTANYL CITRATE (PF) 100 MCG/2ML IJ SOLN
INTRAMUSCULAR | Status: AC
  Filled 2024-05-13: qty 2

## 2024-05-13 MED ORDER — EPHEDRINE 5 MG/ML INJ
INTRAVENOUS | Status: AC
  Filled 2024-05-13: qty 5

## 2024-05-13 MED ORDER — EPHEDRINE SULFATE (PRESSORS) 50 MG/ML IJ SOLN
INTRAMUSCULAR | Status: DC | PRN
  Administered 2024-05-13 (×2): 10 mg via INTRAVENOUS

## 2024-05-13 MED ORDER — OXYCODONE HCL 5 MG/5ML PO SOLN: 5.0000 mg | Freq: Once | ORAL | Status: DC | PRN

## 2024-05-13 MED ORDER — PHENYLEPHRINE 80 MCG/ML (10ML) SYRINGE FOR IV PUSH (FOR BLOOD PRESSURE SUPPORT)
PREFILLED_SYRINGE | INTRAVENOUS | Status: AC
  Filled 2024-05-13: qty 10

## 2024-05-13 MED ORDER — SUCCINYLCHOLINE CHLORIDE 200 MG/10ML IV SOSY
PREFILLED_SYRINGE | INTRAVENOUS | Status: AC
  Filled 2024-05-13: qty 10

## 2024-05-13 MED ORDER — HYDROCODONE-ACETAMINOPHEN 5-325 MG PO TABS: 1.0000 | ORAL_TABLET | Freq: Four times a day (QID) | ORAL | 0 refills | Status: AC | PRN

## 2024-05-13 MED ORDER — DROPERIDOL 2.5 MG/ML IJ SOLN: 0.6250 mg | Freq: Once | INTRAMUSCULAR | Status: DC | PRN

## 2024-05-13 MED ORDER — ONDANSETRON HCL 4 MG/2ML IJ SOLN
INTRAMUSCULAR | Status: AC
  Filled 2024-05-13: qty 2

## 2024-05-13 MED ORDER — MIDAZOLAM HCL 2 MG/2ML IJ SOLN
INTRAMUSCULAR | Status: AC
  Filled 2024-05-13: qty 2

## 2024-05-13 MED ORDER — 0.9 % SODIUM CHLORIDE (POUR BTL) OPTIME
TOPICAL | Status: DC | PRN
  Administered 2024-05-13: 1000 mL

## 2024-05-13 MED ORDER — OXYCODONE HCL 5 MG PO TABS
ORAL_TABLET | ORAL | Status: AC
  Filled 2024-05-13: qty 1

## 2024-05-13 MED ORDER — ATROPINE SULFATE 0.4 MG/ML IV SOLN
INTRAVENOUS | Status: AC
  Filled 2024-05-13: qty 1

## 2024-05-13 MED ORDER — LACTATED RINGERS IV SOLN: INTRAVENOUS | Status: DC

## 2024-05-13 MED ORDER — DEXAMETHASONE SODIUM PHOSPHATE 10 MG/ML IJ SOLN
INTRAMUSCULAR | Status: DC | PRN
  Administered 2024-05-13: 5 mg via INTRAVENOUS

## 2024-05-13 MED ORDER — LIDOCAINE 2% (20 MG/ML) 5 ML SYRINGE
INTRAMUSCULAR | Status: AC
  Filled 2024-05-13: qty 5

## 2024-05-13 MED ORDER — CEFAZOLIN SODIUM-DEXTROSE 2-4 GM/100ML-% IV SOLN
INTRAVENOUS | Status: AC
  Filled 2024-05-13: qty 100

## 2024-05-13 MED ORDER — ONDANSETRON HCL 4 MG/2ML IJ SOLN
INTRAMUSCULAR | Status: DC | PRN
  Administered 2024-05-13: 4 mg via INTRAVENOUS

## 2024-05-13 MED ORDER — BUPIVACAINE HCL (PF) 0.25 % IJ SOLN
INTRAMUSCULAR | Status: DC | PRN
  Administered 2024-05-13: 9 mL

## 2024-05-13 MED ORDER — PROPOFOL 10 MG/ML IV BOLUS
INTRAVENOUS | Status: DC | PRN
  Administered 2024-05-13: 130 mg via INTRAVENOUS
  Administered 2024-05-13: 40 mg via INTRAVENOUS

## 2024-05-13 MED ORDER — ACETAMINOPHEN 500 MG PO TABS
1000.0000 mg | ORAL_TABLET | Freq: Once | ORAL | Status: AC
  Administered 2024-05-13: 1000 mg via ORAL

## 2024-05-13 MED ORDER — FENTANYL CITRATE (PF) 100 MCG/2ML IJ SOLN
INTRAMUSCULAR | Status: AC
Start: 2024-05-13 — End: ?
  Filled 2024-05-13: qty 2

## 2024-05-13 MED ORDER — DEXAMETHASONE SODIUM PHOSPHATE 10 MG/ML IJ SOLN
INTRAMUSCULAR | Status: AC
  Filled 2024-05-13: qty 1

## 2024-05-13 MED ORDER — ACETAMINOPHEN 500 MG PO TABS
ORAL_TABLET | ORAL | Status: AC
  Filled 2024-05-13: qty 2

## 2024-05-13 MED ORDER — FENTANYL CITRATE (PF) 100 MCG/2ML IJ SOLN
25.0000 ug | INTRAMUSCULAR | Status: DC | PRN
  Administered 2024-05-13 (×2): 50 ug via INTRAVENOUS

## 2024-05-13 MED ORDER — MIDAZOLAM HCL 5 MG/5ML IJ SOLN
INTRAMUSCULAR | Status: DC | PRN
  Administered 2024-05-13: 2 mg via INTRAVENOUS

## 2024-05-13 MED ORDER — OXYCODONE HCL 5 MG PO TABS: 5.0000 mg | ORAL_TABLET | Freq: Once | ORAL | Status: DC | PRN

## 2024-05-13 MED ORDER — CEFAZOLIN SODIUM-DEXTROSE 2-4 GM/100ML-% IV SOLN
2.0000 g | INTRAVENOUS | Status: AC
  Administered 2024-05-13: 2 g via INTRAVENOUS

## 2024-05-13 SURGICAL SUPPLY — 46 items
BLADE MINI RND TIP GREEN BEAV (BLADE) IMPLANT
BLADE SURG 15 STRL LF DISP TIS (BLADE) ×2 IMPLANT
BNDG ELASTIC 3INX 5YD STR LF (GAUZE/BANDAGES/DRESSINGS) ×2 IMPLANT
BNDG ELASTIC 4INX 5YD STR LF (GAUZE/BANDAGES/DRESSINGS) ×1 IMPLANT
BNDG ESMARK 4X9 LF (GAUZE/BANDAGES/DRESSINGS) ×1 IMPLANT
BNDG GAUZE DERMACEA FLUFF 4 (GAUZE/BANDAGES/DRESSINGS) ×1 IMPLANT
CHLORAPREP W/TINT 26 (MISCELLANEOUS) ×1 IMPLANT
CLIP TI MEDIUM 6 (CLIP) IMPLANT
CORD BIPOLAR FORCEPS 12FT (ELECTRODE) ×1 IMPLANT
COVER BACK TABLE 60X90IN (DRAPES) ×1 IMPLANT
COVER MAYO STAND STRL (DRAPES) ×1 IMPLANT
CUFF TOURN SGL QUICK 18X3 (MISCELLANEOUS) ×1 IMPLANT
CUFF TOURN SGL QUICK 18X4 (TOURNIQUET CUFF) IMPLANT
DRAPE EXTREMITY T 121X128X90 (DISPOSABLE) ×1 IMPLANT
DRAPE SURG 17X23 STRL (DRAPES) ×1 IMPLANT
GAUZE 4X4 16PLY ~~LOC~~+RFID DBL (SPONGE) IMPLANT
GAUZE PAD ABD 8X10 STRL (GAUZE/BANDAGES/DRESSINGS) ×1 IMPLANT
GAUZE SPONGE 4X4 12PLY STRL (GAUZE/BANDAGES/DRESSINGS) ×1 IMPLANT
GAUZE XEROFORM 1X8 LF (GAUZE/BANDAGES/DRESSINGS) ×1 IMPLANT
GLOVE BIO SURGEON STRL SZ7.5 (GLOVE) ×1 IMPLANT
GLOVE BIOGEL PI IND STRL 6.5 (GLOVE) IMPLANT
GLOVE BIOGEL PI IND STRL 8 (GLOVE) ×1 IMPLANT
GLOVE BIOGEL PI IND STRL 8.5 (GLOVE) ×1 IMPLANT
GLOVE SURG ORTHO 8.0 STRL STRW (GLOVE) ×1 IMPLANT
GLOVE SURG SS PI 6.5 STRL IVOR (GLOVE) IMPLANT
GOWN STRL REUS W/ TWL LRG LVL3 (GOWN DISPOSABLE) ×1 IMPLANT
GOWN STRL REUS W/TWL XL LVL3 (GOWN DISPOSABLE) ×2 IMPLANT
NDL HYPO 25X1 1.5 SAFETY (NEEDLE) IMPLANT
NEEDLE HYPO 25X1 1.5 SAFETY (NEEDLE) ×1 IMPLANT
NS IRRIG 1000ML POUR BTL (IV SOLUTION) ×1 IMPLANT
PACK BASIN DAY SURGERY FS (CUSTOM PROCEDURE TRAY) ×1 IMPLANT
PAD CAST 3X4 CTTN HI CHSV (CAST SUPPLIES) ×1 IMPLANT
PAD CAST 4YDX4 CTTN HI CHSV (CAST SUPPLIES) ×1 IMPLANT
PADDING CAST ABS COTTON 4X4 ST (CAST SUPPLIES) ×1 IMPLANT
SLEEVE SCD COMPRESS KNEE MED (STOCKING) ×1 IMPLANT
SPIKE FLUID TRANSFER (MISCELLANEOUS) IMPLANT
SPLINT PLASTER CAST FAST 5X30 (CAST SUPPLIES) IMPLANT
SPLINT PLASTER CAST XFAST 3X15 (CAST SUPPLIES) IMPLANT
STOCKINETTE 4X48 STRL (DRAPES) ×1 IMPLANT
SUT ETHILON 4 0 PS 2 18 (SUTURE) ×1 IMPLANT
SUT VIC AB 2-0 SH 27XBRD (SUTURE) ×1 IMPLANT
SUT VIC AB 4-0 PS2 18 (SUTURE) IMPLANT
SYR BULB EAR ULCER 3OZ GRN STR (SYRINGE) ×1 IMPLANT
SYR CONTROL 10ML LL (SYRINGE) IMPLANT
TOWEL GREEN STERILE FF (TOWEL DISPOSABLE) ×1 IMPLANT
UNDERPAD 30X36 HEAVY ABSORB (UNDERPADS AND DIAPERS) ×1 IMPLANT

## 2024-05-13 NOTE — Anesthesia Procedure Notes (Signed)
 Anesthesia Procedure Note Ultrasound guided peripheral venous access placement. L AC

## 2024-05-13 NOTE — Discharge Instructions (Addendum)
 [facility] Instructions Hand Surgery  Wound Care: Keep your hand elevated above the level of your heart.  Do not allow it to dangle by your side.  Keep the dressing dry and do not remove it unless your doctor advises you to do so.  He will usually change it at the time of your post-op visit.  Moving your fingers is advised to stimulate circulation but will depend on the site of your surgery.  If you have a splint applied, your doctor will advise you regarding movement.  Activity: Do not drive or operate machinery today.  Rest today and then you may return to your normal activity and work as indicated by your physician.  Diet:  Drink liquids today or eat a light diet.  You may resume a regular diet tomorrow.    General expectations: Pain for two to three days. Fingers may become slightly swollen.  Call your doctor if any of the following occur: Severe pain not relieved by pain medication. Elevated temperature. Dressing soaked with blood. Inability to move fingers. White or bluish color to fingers.    Post Anesthesia Home Care Instructions  Activity: Get plenty of rest for the remainder of the day. A responsible individual must stay with you for 24 hours following the procedure.  For the next 24 hours, DO NOT: -Drive a car -Advertising copywriter -Drink alcoholic beverages -Take any medication unless instructed by your physician -Make any legal decisions or sign important papers.  Meals: Start with liquid foods such as gelatin or soup. Progress to regular foods as tolerated. Avoid greasy, spicy, heavy foods. If nausea and/or vomiting occur, drink only clear liquids until the nausea and/or vomiting subsides. Call your physician if vomiting continues.  Special Instructions/Symptoms: Your throat may feel dry or sore from the anesthesia or the breathing tube placed in your throat during surgery. If this causes discomfort, gargle with warm salt water . The discomfort should disappear  within 24 hours.  If you had a scopolamine  patch placed behind your ear for the management of post- operative nausea and/or vomiting:  1. The medication in the patch is effective for 72 hours, after which it should be removed.  Wrap patch in a tissue and discard in the trash. Wash hands thoroughly with soap and water . 2. You may remove the patch earlier than 72 hours if you experience unpleasant side effects which may include dry mouth, dizziness or visual disturbances. 3. Avoid touching the patch. Wash your hands with soap and water  after contact with the patch.     *May have Tylenol  at 3:15pm*

## 2024-05-13 NOTE — Transfer of Care (Signed)
 Immediate Anesthesia Transfer of Care Note  Patient: Sara [name]  Procedure(s) Performed: NERVE TUNNEL DECOMPRESSION, UPPER EXTREMITY (Right: Arm Lower)  Patient Location: PACU  Anesthesia Type:General  Level of Consciousness: sedated  Airway & Oxygen Therapy: Patient Spontanous Breathing and Patient connected to face mask oxygen  Post-op Assessment: Report given to RN and Post -op Vital signs reviewed and stable  Post vital signs: Reviewed and stable  Last Vitals:  Vitals Value Taken Time  BP    Temp    Pulse    Resp    SpO2      Last Pain:  Vitals:   05/13/24 0908  TempSrc: Temporal  PainSc: 0-No pain         Complications: No notable events documented.

## 2024-05-13 NOTE — Anesthesia Postprocedure Evaluation (Signed)
 Anesthesia Post Note  Patient: Sara [name]  Procedure(s) Performed: NERVE TUNNEL DECOMPRESSION, UPPER EXTREMITY (Right: Arm Lower)     Patient location during evaluation: PACU Anesthesia Type: General Level of consciousness: sedated and patient cooperative Pain management: pain level controlled Vital Signs Assessment: post-procedure vital signs reviewed and stable Respiratory status: spontaneous breathing Cardiovascular status: stable Anesthetic complications: no   No notable events documented.  Last Vitals:  Vitals:   05/13/24 1325 05/13/24 1333  BP:  (!) 148/92  Pulse: 77 78  Resp: 17 16  Temp:  (!) 36.2 C  SpO2: 98%     Last Pain:  Vitals:   05/13/24 1333  TempSrc: Temporal  PainSc: 4                  Gorman Laughter

## 2024-05-13 NOTE — H&P (Signed)
 Sara [name] is an 58 y.o. female.   Chief Complaint: ulnar neuropathy HPI: 58 yo female with right ring and small finger numbness and tingling.  Positive nerve conduction studies localizing to forearm in area of previous surgery.  She wishes to proceed with surgical ulnar nerve decompression.  Allergies:  Allergies  Allergen Reactions   Azithromycin Diarrhea and Nausea And Vomiting   Benzalkonium Chloride Other (See Comments)    Pt unsure of allergy    Gabapentin  Other (See Comments)    Patient states it "makes her loopy"   Augmentin [Amoxicillin-Pot Clavulanate] Nausea Only   Ciprofloxacin Nausea Only   Neomycin  Rash   Neosporin [Bacitracin-Polymyxin B] Itching and Rash   Neosporin [Neomycin -Bacitracin Zn-Polymyx] Rash   Septra [Sulfamethoxazole-Trimethoprim] Nausea Only    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    Past Surgical History:  Procedure Laterality Date   COLON SURGERY     COLONOSCOPY WITH PROPOFOL  N/A 03/25/2022   Procedure: COLONOSCOPY WITH PROPOFOL ;  Surgeon: Selena Daily, MD;  Location: [facility];  Service: Gastroenterology;  Laterality: N/A;   COLOSTOMY REVERSAL N/A 03/27/2022   Procedure: COLOSTOMY TAKEDOWN VENTRAL HERNIA REPAIR;  Surgeon: Junie Olds, MD;  Location: [facility];  Service: General;  Laterality: N/A;   FEMUR IM NAIL Left 01/20/2024   Procedure: INTRAMEDULLARY (IM) NAIL FEMORAL WITH GRAFT;  Surgeon: Hardy Lia, MD;  Location: [facility] OR;  Service: Orthopedics;  Laterality: Left;   I & D EXTREMITY Left 11/06/2020   Procedure: IRRIGATION AND DEBRIDEMENT EXTREMITY;  Surgeon: Hardy Lia, MD;  Location: [facility] OR;  Service: Orthopedics;   Laterality: Left;   IR CATHETER TUBE CHANGE  11/15/2020   IRRIGATION AND DEBRIDEMENT KNEE  09/22/2020   Procedure: IRRIGATION AND DEBRIDEMENT LEFT KNEE PLACEMENT OF EXTERNAL FIXATION,;  Surgeon: Janeth Medicus, MD;  Location: [facility];  Service: Orthopedics;;   LAPAROTOMY N/A 09/24/2020   Procedure: EXPLORATORY LAPAROTOMY COLOSTOMY  AND REPAIR  FLANK HERNIA;  Surgeon: Adalberto Acton, MD;  Location: [facility] OR;  Service: General;  Laterality: N/A;   LAPAROTOMY N/A 09/22/2020   Procedure: EXPLORATORY LAPAROTOMY, Ileocecectomy;  Surgeon: Adalberto Acton, MD;  Location: [facility] OR;  Service: General;  Laterality: N/A;   OPEN REDUCTION INTERNAL FIXATION (ORIF) DISTAL RADIAL FRACTURE Right 09/26/2020   Procedure: OPEN REDUCTION INTERNAL FIXATION (ORIF) DISTAL RADIUS FRACTURE;  Surgeon: Hardy Lia, MD;  Location: [facility] OR;  Service: Orthopedics;  Laterality: Right;   ORIF FEMUR FRACTURE Left 11/02/2020   Procedure: INCISION DRAINAGE DEEP WOUND LEFT LEG, APPLICATION OF WOUND VAC;  Surgeon: Hardy Lia, MD;  Location: [facility] OR;  Service: Orthopedics;  Laterality: Left;   ORIF FEMUR FRACTURE Left 12/07/2020   Procedure: OPEN REDUCTION INTERNAL FIXATION (ORIF) DISTAL FEMUR FRACTURE : REPAIR OF LEFT DISTAL FEMUR NONUNION;  Surgeon: Hardy Lia, MD;  Location: [facility] OR;  Service: Orthopedics;  Laterality: Left;   ORIF FEMUR FRACTURE Left 01/24/2022   Procedure: REPAIR LEFT FEMUR NONUNION, QUADRECEPLASTY;  Surgeon: Hardy Lia, MD;  Location: [facility] OR;  Service: Orthopedics;  Laterality: Left;   ORIF TIBIA PLATEAU Bilateral 09/26/2020   Procedure:  OPEN REDUCTION INTERNAL FIXATION (ORIF) RIGHT DISTAL FEMUR, RIGHT CALCANEUS, LEFT DISTAL FEMUR, LEFT TIBIAL PLATEAU FRACTURE.  IRRIGATION AND DEBRIDEMENT LEFT LEG; REMOVAL OF EXTERNAL FIXATOR LEFT LEG;  Surgeon: Hardy Lia, MD;  Location: [facility] OR;  Service: Orthopedics;  Laterality: Bilateral;    Family History: Family History  Problem Relation Age of Onset   Breast  cancer Maternal Aunt        70/80s   Cancer Maternal Aunt        unsure if uterine or ovarian    Social History:   reports that she has quit smoking. Her smoking use included cigarettes. She has quit using smokeless tobacco. She reports current alcohol use. She reports current drug use. Drug: Marijuana.  Medications: Medications Prior to Admission  Medication Sig Dispense Refill   ACAI BERRY PO Take 1 capsule by mouth daily.     Cholecalciferol  (VITAMIN D3 PO) Take 1 tablet by mouth daily.     methocarbamol  (ROBAXIN ) 500 MG tablet Take 1 tablet (500 mg total) by mouth 3 (three) times daily as needed for muscle spasms. 60 tablet 0   Omega-3 Fatty Acids (FISH OIL PO) Take 1 capsule by mouth daily.     ondansetron  (ZOFRAN ) 4 MG tablet TAKE 1 TABLET BY MOUTH EVERY 8 HOURS AS NEEDED FOR NAUSEA OR VOMITING 30 tablet 1   oxyCODONE -acetaminophen  (PERCOCET) 7.5-325 MG tablet Take 1-2 tablets by mouth every 8 (eight) hours as needed for severe pain (pain score 7-10). 30 tablet 0   spironolactone  (ALDACTONE ) 25 MG tablet Take 1 tablet (25 mg total) by mouth daily. 30 tablet 11   TRINTELLIX  20 MG TABS tablet TAKE 1 TABLET BY MOUTH ONCE DAILY 30 tablet 0   acidophilus (RISAQUAD) CAPS capsule Take 1 capsule by mouth daily.     albuterol  (VENTOLIN  HFA) 108 (90 Base) MCG/ACT inhaler Inhale 2 puffs into the lungs every 6 (six) hours as needed for wheezing or shortness of breath. 18 g 5   docusate sodium  (COLACE) 100 MG capsule Take 1 capsule (100 mg total) by mouth 2 (two) times daily. 10 capsule 0   fluticasone  (FLONASE ) 50 MCG/ACT nasal spray Place 1 spray into both nostrils daily. 48 mL 3   levonorgestrel  (MIRENA ) 20 MCG/DAY IUD 1 each by Intrauterine route once.     ZEPBOUND  7.5 MG/0.5ML Pen INJECT 7.5MG  SUBCUTANEOUSLY ONCE A WEEK 2 mL 3    Results for orders placed or performed during the hospital encounter of 05/13/24 (from the past 48 hours)  Basic metabolic panel per protocol     Status: Abnormal    Collection Time: 05/12/24  2:30 PM  Result Value Ref Range   Sodium 143 135 - 145 mmol/L   Potassium 3.7 3.5 - 5.1 mmol/L   Chloride 107 98 - 111 mmol/L   CO2 26 22 - 32 mmol/L   Glucose, Bld 89 70 - 99 mg/dL    Comment: Glucose reference range applies only to samples taken after fasting for at least 8 hours.   BUN 11 6 - 20 mg/dL   Creatinine, Ser 1.47 (H) 0.44 - 1.00 mg/dL   Calcium  8.9 8.9 - 10.3 mg/dL   GFR, Estimated >82 >95 mL/min    Comment: (NOTE) Calculated using the CKD-EPI Creatinine Equation (2021)    Anion gap 10 5 - 15    Comment: Performed at [facility], [address] [place], [place] [address]  Pregnancy, urine POC     Status: None   Collection Time:  05/13/24  8:38 AM  Result Value Ref Range   Preg Test, Ur NEGATIVE NEGATIVE    Comment:        THE SENSITIVITY OF THIS METHODOLOGY IS >24 mIU/mL     No results found.    Height 5\' 5"  (1.651 m), weight 83 kg.  General appearance: alert, cooperative, and appears stated age Head: Normocephalic, without obvious abnormality, atraumatic Neck: supple, symmetrical, trachea midline Extremities: Intact sensation and capillary refill all digits.  +epl/fpl/io.  No wounds.  Skin: Skin color, texture, turgor normal. No rashes or lesions Neurologic: Grossly normal Incision/Wound: none  Assessment/Plan Right ulnar neuropathy in forearm.  Non operative and operative treatment options have been discussed with the patient and patient wishes to proceed with operative treatment. Risks, benefits, and alternatives of surgery have been discussed and the patient agrees with the plan of care.   Kae Lauman 05/13/2024, 8:50 AM

## 2024-05-13 NOTE — Op Note (Signed)
 I assisted Surgeons and Role:    * Brunilda Capra, MD - Primary    Lyanne Sample, MD - Assisting on the Procedure(s): NERVE TUNNEL DECOMPRESSION, UPPER EXTREMITY on 05/13/2024.  I provided assistance on this case as follows: Set up, approach, identification of the nerve, release of the ulnar nerve through the forearm, closure of the wound and application of the dressing.  Electronically signed by: Lyanne Sample, MD Date: 05/13/2024 Time: 12:36 PM

## 2024-05-13 NOTE — Anesthesia Procedure Notes (Addendum)
 Procedure Name: LMA Insertion Date/Time: 05/13/2024 11:59 AM  Performed by: Eugenia Hess, CRNAPre-anesthesia Checklist: Patient identified, Emergency Drugs available, Suction available and Patient being monitored Patient Re-evaluated:Patient Re-evaluated prior to induction Oxygen Delivery Method: Circle System Utilized Preoxygenation: Pre-oxygenation with 100% oxygen Induction Type: IV induction Ventilation: Mask ventilation without difficulty LMA: LMA inserted LMA Size: 4.0 Number of attempts: 1 Airway Equipment and Method: bite block Placement Confirmation: positive ETCO2 Tube secured with: Tape Dental Injury: Teeth and Oropharynx as per pre-operative assessment

## 2024-05-13 NOTE — Op Note (Addendum)
 NAME: Sara [name] MEDICAL RECORD NO: [id number] DATE OF BIRTH: 1966-07-25 FACILITY[facility] LOCATION: [facility] PHYSICIAN: Kiyo Heal R. Birttany Dechellis, MD   OPERATIVE REPORT   DATE OF PROCEDURE: 05/13/24    PREOPERATIVE DIAGNOSIS: Right ulnar neuropathy in forearm   POSTOPERATIVE DIAGNOSIS: Right ulnar neuropathy in forearm   PROCEDURE: Right ulnar nerve between right forearm   SURGEON:  Brunilda Capra, M.D.   ASSISTANT: Lyanne Sample, MD   ANESTHESIA:  General   INTRAVENOUS FLUIDS:  Per anesthesia flow sheet.   ESTIMATED BLOOD LOSS:  Minimal.   COMPLICATIONS:  None.   SPECIMENS:  none   TOURNIQUET TIME:    Total Tourniquet Time Documented: Upper Arm (Right) - 18 minutes Total: Upper Arm (Right) - 18 minutes    DISPOSITION:  Stable to PACU.   INDICATIONS: 58 year old female with numbness of the ring of right ring and small fingers..  Nerve conduction studies reveal ulnar neuropathy in the forearm in the area of previous surgery.  She wishes to proceed with surgical ulnar nerve under decompression.  Risks, benefits and alternatives of surgery were discussed including the risks of blood loss, infection, damage to nerves, vessels, tendons, ligaments, bone for surgery, need for additional surgery, complications with wound healing, continued pain, stiffness, , recurrence.  She voiced understanding of these risks and elected to proceed.  OPERATIVE COURSE:  After being identified preoperatively by myself,  the patient and I agreed on the procedure and site of the procedure.  The surgical site was marked.  Surgical consent had been signed. Preoperative IV antibiotic prophylaxis was given. She was transferred to the operating room and placed on the operating table in supine position with the Right upper extremity on an arm board.  General anesthesia was induced by the anesthesiologist.  Right upper extremity was prepped and draped in normal sterile orthopedic fashion.  A surgical  pause was performed between the surgeons, anesthesia, and operating room staff and all were in agreement as to the patient, procedure, and site of procedure.  Tourniquet at the proximal aspect of the extremity was inflated to 250 mmHg after exsanguination of the arm with an Esmarch bandage.  Incision was made along the distal forearm in the area of previous surgical procedure.  I have not seen it is a scalpel tissues by spreading technique.   The ulnar nerve was identified.  It was carefully decompressed both proximally and and distally under direct visualization.  There was some swelling of the nerve distally.  The nerve was was released circumferentially and to ensure that there was no scar tethering the nerve.  No distinct compressive area was noted.  The wound was irrigated with sterile saline and closed with 4-0 nylon in a horizontal mattress fashion.  It was injected with quarter percent plain Marcaine  to aid in postoperative analgesia.  The wound was dressed with sterile Xeroform 4 x 4's and ABD and wrapped with Kerlix and Ace bandage.  Tourniquet was deflated at 18 minutes.  Fingertips were pink with brisk capillary refill after deflation of tourniquet.  The operative  drapes were broken down.  The patient was awoken from anesthesia safely.  She was transferred back to the stretcher and taken to PACU in stable condition.  I will see her back in the office in 1 week for postoperative followup.  I will give her a prescription for Norco 5/325 1 tab PO q6 hours prn pain, dispense #15.   Kimeka Badour, MD Electronically signed, 05/13/24  Addendum (05/19/24):  Correct preop diagnosis

## 2024-05-14 ENCOUNTER — Encounter (HOSPITAL_BASED_OUTPATIENT_CLINIC_OR_DEPARTMENT_OTHER): Payer: Self-pay | Admitting: Orthopedic Surgery

## 2024-05-21 ENCOUNTER — Other Ambulatory Visit: Payer: Self-pay | Admitting: Cardiology

## 2024-05-25 ENCOUNTER — Other Ambulatory Visit: Payer: Self-pay | Admitting: Cardiology

## 2024-05-25 ENCOUNTER — Telehealth: Payer: Self-pay

## 2024-05-25 ENCOUNTER — Encounter: Payer: Self-pay | Admitting: Cardiology

## 2024-05-25 MED ORDER — ZEPBOUND 10 MG/0.5ML ~~LOC~~ SOAJ: 10.0000 mg | SUBCUTANEOUS | 4 refills | Status: DC

## 2024-05-25 MED ORDER — VORTIOXETINE HBR 20 MG PO TABS: 20.0000 mg | ORAL_TABLET | Freq: Every day | ORAL | 4 refills | Status: DC

## 2024-05-25 NOTE — Telephone Encounter (Signed)
 got a call from [facility] about pt's rx zepbound - they asked if we can resend rx for quantity of 2mL instead of 0.78mL? Please advise

## 2024-05-25 NOTE — Progress Notes (Signed)
 Sara [name]

## 2024-06-04 ENCOUNTER — Ambulatory Visit: Payer: Medicaid Other | Admitting: Cardiology

## 2024-06-11 ENCOUNTER — Ambulatory Visit: Admitting: Cardiology

## 2024-06-22 ENCOUNTER — Other Ambulatory Visit: Payer: Self-pay | Admitting: Cardiology

## 2024-07-01 ENCOUNTER — Telehealth: Payer: Self-pay

## 2024-07-01 NOTE — Telephone Encounter (Signed)
 Patient called requesting something to treat her UTI. Please advise.

## 2024-07-05 NOTE — Telephone Encounter (Signed)
Pt informed

## 2024-07-13 ENCOUNTER — Ambulatory Visit: Payer: Self-pay | Admitting: Cardiology

## 2024-07-13 ENCOUNTER — Other Ambulatory Visit: Payer: Self-pay | Admitting: Cardiology

## 2024-07-13 ENCOUNTER — Ambulatory Visit (INDEPENDENT_AMBULATORY_CARE_PROVIDER_SITE_OTHER): Admitting: Cardiology

## 2024-07-13 DIAGNOSIS — R300 Dysuria: Secondary | ICD-10-CM

## 2024-07-13 LAB — POCT URINALYSIS DIPSTICK
Bilirubin, UA: 2
Blood, UA: POSITIVE
Glucose, UA: NEGATIVE
Ketones, UA: NEGATIVE
Nitrite, UA: NEGATIVE
Protein, UA: POSITIVE — AB
Spec Grav, UA: >=1.03 — AB (ref 1.010–1.025)
Urobilinogen, UA: 1 U/dL
pH, UA: 6 (ref 5.0–8.0)

## 2024-07-13 MED ORDER — NITROFURANTOIN MONOHYD MACRO 100 MG PO CAPS: 100.0000 mg | ORAL_CAPSULE | Freq: Two times a day (BID) | ORAL | 0 refills | Status: AC

## 2024-07-15 LAB — URINE CULTURE

## 2024-07-19 ENCOUNTER — Ambulatory Visit: Payer: Self-pay | Admitting: Internal Medicine

## 2024-08-06 ENCOUNTER — Other Ambulatory Visit: Payer: Self-pay | Admitting: Cardiology

## 2024-08-06 ENCOUNTER — Ambulatory Visit (INDEPENDENT_AMBULATORY_CARE_PROVIDER_SITE_OTHER): Admitting: Cardiology

## 2024-08-06 DIAGNOSIS — R300 Dysuria: Secondary | ICD-10-CM | POA: Diagnosis not present

## 2024-08-06 LAB — POCT URINALYSIS DIPSTICK
Blood, UA: NEGATIVE
Glucose, UA: NEGATIVE
Nitrite, UA: NEGATIVE
Protein, UA: POSITIVE — AB
Spec Grav, UA: 1.02 (ref 1.010–1.025)
Urobilinogen, UA: 0.2 U/dL
pH, UA: 6 (ref 5.0–8.0)

## 2024-08-06 MED ORDER — DOXYCYCLINE HYCLATE 100 MG PO TABS
100.0000 mg | ORAL_TABLET | Freq: Two times a day (BID) | ORAL | 0 refills | Status: AC
Start: 2024-08-06 — End: 2024-08-11

## 2024-08-18 ENCOUNTER — Other Ambulatory Visit: Payer: Self-pay | Admitting: Cardiology

## 2024-09-10 ENCOUNTER — Telehealth: Payer: Self-pay

## 2024-09-10 NOTE — Telephone Encounter (Signed)
 Patient LM asking for another round of doxycycline  to be called in for her as she feels like she still has a UTI, please advidse

## 2024-09-13 NOTE — Telephone Encounter (Signed)
 My chart message sent

## 2024-10-18 ENCOUNTER — Encounter: Payer: Self-pay | Admitting: Cardiology

## 2024-10-18 NOTE — Telephone Encounter (Signed)
 Patient also LVM at 2:09 asking for the same thing

## 2024-10-22 ENCOUNTER — Telehealth: Payer: Self-pay

## 2024-10-22 NOTE — Telephone Encounter (Signed)
 The Patients insurance denied the zepbound  [place] and she is wanting to know if she can take the phentermine.

## 2024-10-26 ENCOUNTER — Other Ambulatory Visit: Payer: Self-pay | Admitting: Cardiology

## 2024-10-26 DIAGNOSIS — E559 Vitamin D deficiency, unspecified: Secondary | ICD-10-CM

## 2024-10-26 DIAGNOSIS — Z1329 Encounter for screening for other suspected endocrine disorder: Secondary | ICD-10-CM

## 2024-10-26 DIAGNOSIS — Z131 Encounter for screening for diabetes mellitus: Secondary | ICD-10-CM

## 2024-10-26 DIAGNOSIS — Z13220 Encounter for screening for lipoid disorders: Secondary | ICD-10-CM

## 2024-11-04 ENCOUNTER — Ambulatory Visit: Payer: Self-pay | Admitting: Cardiology

## 2024-11-04 ENCOUNTER — Other Ambulatory Visit

## 2024-11-04 ENCOUNTER — Ambulatory Visit (INDEPENDENT_AMBULATORY_CARE_PROVIDER_SITE_OTHER): Admitting: Cardiology

## 2024-11-04 ENCOUNTER — Other Ambulatory Visit: Payer: Self-pay | Admitting: Cardiology

## 2024-11-04 DIAGNOSIS — Z131 Encounter for screening for diabetes mellitus: Secondary | ICD-10-CM

## 2024-11-04 DIAGNOSIS — E559 Vitamin D deficiency, unspecified: Secondary | ICD-10-CM

## 2024-11-04 DIAGNOSIS — Z13220 Encounter for screening for lipoid disorders: Secondary | ICD-10-CM

## 2024-11-04 DIAGNOSIS — R339 Retention of urine, unspecified: Secondary | ICD-10-CM

## 2024-11-04 DIAGNOSIS — Z1329 Encounter for screening for other suspected endocrine disorder: Secondary | ICD-10-CM

## 2024-11-04 DIAGNOSIS — R300 Dysuria: Secondary | ICD-10-CM

## 2024-11-04 LAB — POCT URINALYSIS DIPSTICK
Bilirubin, UA: POSITIVE
Blood, UA: NEGATIVE
Glucose, UA: NEGATIVE
Ketones, UA: NEGATIVE
Leukocytes, UA: NEGATIVE
Nitrite, UA: NEGATIVE
Protein, UA: POSITIVE — AB
Spec Grav, UA: >=1.03 — AB (ref 1.010–1.025)
Urobilinogen, UA: 0.2 U/dL — NL
pH, UA: 6 (ref 5.0–8.0)

## 2024-11-05 ENCOUNTER — Ambulatory Visit: Payer: Self-pay | Admitting: Cardiology

## 2024-11-05 LAB — CBC WITH DIFFERENTIAL/PLATELET
Basophils Absolute: 0.1 x10E3/uL (ref 0.0–0.2)
Basos: 1 %
EOS (ABSOLUTE): 0.1 x10E3/uL (ref 0.0–0.4)
Eos: 1 %
Hematocrit: 39.9 % (ref 34.0–46.6)
Hemoglobin: 13.2 g/dL (ref 11.1–15.9)
Immature Grans (Abs): 0 x10E3/uL (ref 0.0–0.1)
Immature Granulocytes: 0 %
Lymphocytes Absolute: 1 x10E3/uL (ref 0.7–3.1)
Lymphs: 15 %
MCH: 31.8 pg (ref 26.6–33.0)
MCHC: 33.1 g/dL (ref 31.5–35.7)
MCV: 96 fL (ref 79–97)
Monocytes Absolute: 0.4 x10E3/uL (ref 0.1–0.9)
Monocytes: 6 %
Neutrophils Absolute: 4.9 x10E3/uL (ref 1.4–7.0)
Neutrophils: 77 %
Platelets: 166 x10E3/uL (ref 150–450)
RBC: 4.15 x10E6/uL (ref 3.77–5.28)
RDW: 12.8 % (ref 11.7–15.4)
WBC: 6.5 x10E3/uL (ref 3.4–10.8)

## 2024-11-05 LAB — CMP14+EGFR
ALT: 25 IU/L (ref 0–32)
AST: 30 IU/L (ref 0–40)
Albumin: 3.6 g/dL — ABNORMAL LOW (ref 3.8–4.9)
Alkaline Phosphatase: 70 IU/L (ref 49–135)
BUN/Creatinine Ratio: 9 (ref 9–23)
BUN: 9 mg/dL (ref 6–24)
Bilirubin Total: 0.5 mg/dL (ref 0.0–1.2)
CO2: 20 mmol/L (ref 20–29)
Calcium: 8.8 mg/dL (ref 8.7–10.2)
Chloride: 107 mmol/L — ABNORMAL HIGH (ref 96–106)
Creatinine, Ser: 0.95 mg/dL (ref 0.57–1.00)
Globulin, Total: 2.2 g/dL (ref 1.5–4.5)
Glucose: 105 mg/dL — ABNORMAL HIGH (ref 70–99)
Potassium: 3.6 mmol/L (ref 3.5–5.2)
Sodium: 144 mmol/L (ref 134–144)
Total Protein: 5.8 g/dL — ABNORMAL LOW (ref 6.0–8.5)
eGFR: 70 mL/min/1.73 (ref >59)

## 2024-11-05 LAB — HEMOGLOBIN A1C
Est. average glucose Bld gHb Est-mCnc: 91 mg/dL
Hgb A1c MFr Bld: 4.8 % (ref 4.8–5.6)

## 2024-11-05 LAB — LIPID PANEL
Chol/HDL Ratio: 2.8 ratio (ref 0.0–4.4)
Cholesterol, Total: 116 mg/dL (ref 100–199)
HDL: 41 mg/dL (ref >39)
LDL Chol Calc (NIH): 60 mg/dL (ref 0–99)
Triglycerides: 74 mg/dL (ref 0–149)
VLDL Cholesterol Cal: 15 mg/dL (ref 5–40)

## 2024-11-05 LAB — VITAMIN D 25 HYDROXY (VIT D DEFICIENCY, FRACTURES): Vit D, 25-Hydroxy: 85.3 ng/mL (ref 30.0–100.0)

## 2024-11-05 LAB — TSH: TSH: 2.15 u[IU]/mL (ref 0.450–4.500)

## 2024-11-08 DIAGNOSIS — R300 Dysuria: Secondary | ICD-10-CM | POA: Insufficient documentation

## 2024-11-08 NOTE — Progress Notes (Signed)
 Opened in error

## 2024-11-09 ENCOUNTER — Encounter: Payer: Self-pay | Admitting: Cardiology

## 2024-11-09 ENCOUNTER — Other Ambulatory Visit: Payer: Self-pay | Admitting: Cardiology

## 2024-11-09 ENCOUNTER — Ambulatory Visit: Admitting: Cardiology

## 2024-11-09 VITALS — BP 94/52 | HR 72 | Ht 65.0 in | Wt 163.0 lb

## 2024-11-09 DIAGNOSIS — L732 Hidradenitis suppurativa: Secondary | ICD-10-CM

## 2024-11-09 DIAGNOSIS — Z1231 Encounter for screening mammogram for malignant neoplasm of breast: Secondary | ICD-10-CM

## 2024-11-09 DIAGNOSIS — H814 Vertigo of central origin: Secondary | ICD-10-CM | POA: Insufficient documentation

## 2024-11-09 DIAGNOSIS — N3000 Acute cystitis without hematuria: Secondary | ICD-10-CM | POA: Diagnosis not present

## 2024-11-09 DIAGNOSIS — Z0130 Encounter for examination of blood pressure without abnormal findings: Secondary | ICD-10-CM

## 2024-11-09 DIAGNOSIS — Z131 Encounter for screening for diabetes mellitus: Secondary | ICD-10-CM

## 2024-11-09 LAB — URINE CULTURE

## 2024-11-09 MED ORDER — CLINDAMYCIN PHOS (TWICE-DAILY) 1 % EX GEL: Freq: Two times a day (BID) | CUTANEOUS | 0 refills | Status: DC

## 2024-11-09 MED ORDER — DOXYCYCLINE HYCLATE 100 MG PO TABS: 100.0000 mg | ORAL_TABLET | Freq: Two times a day (BID) | ORAL | 0 refills | Status: AC

## 2024-11-09 MED ORDER — WEGOVY 0.25 MG/0.5ML ~~LOC~~ SOAJ: 0.2500 mg | SUBCUTANEOUS | 3 refills | Status: DC

## 2024-11-09 NOTE — Progress Notes (Addendum)
 Established Patient Office Visit  Subjective:  Patient ID: Sara [name], female    DOB: 1966/09/30  Age: 58 y.o. MRN: [id number]  Chief Complaint  Patient presents with   Results    Discuss Lab Results    Patient in office for regular follow up, discuss recent lab results. Patient has multiple complaints, requests.  Patient complaining of boils, requesting a referral to dermatology for her HS. Referral sent.  Patient thinks she may have UTI. Complains of urgency, frequency and dysuria that started about a week ago. UA today abnormal. Will send for culture. Will send in doxycyline due to antibiotic allergies.  Patient complains of vertigo. States she has seen ENT for the epley maneuver which didn't help. Will order a MRI of the head.  Due for mammogram, order placed.  Discussed recent lab work. Hgb A1c normal, LDL at goal. Normal kidney function.  Patient successfully lost weight on Zepbound . Down about 57 lbs. Insurance no longer paying for Zepbound . Patient requesting Wegovy  due to history of palpitations.  Continue all other medications.   Urinary Tract Infection  This is a new problem. The current episode started in the past 7 days. There has been no fever. Associated symptoms include frequency and urgency. She has tried nothing for the symptoms. The treatment provided no relief.    No other concerns at this time.   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    Past Surgical History:  Procedure Laterality Date   COLON SURGERY     COLONOSCOPY WITH PROPOFOL  N/A 03/25/2022   Procedure: COLONOSCOPY WITH PROPOFOL ;  Surgeon: Unk Corinn Skiff, MD;  Location: [facility];  Service:  Gastroenterology;  Laterality: N/A;   COLOSTOMY REVERSAL N/A 03/27/2022   Procedure: COLOSTOMY TAKEDOWN VENTRAL HERNIA REPAIR;  Surgeon: Lyndel Deward PARAS, MD;  Location: [facility];  Service: General;  Laterality: N/A;   FEMUR IM NAIL Left 01/20/2024   Procedure: INTRAMEDULLARY (IM) NAIL FEMORAL WITH GRAFT;  Surgeon: Celena Sharper, MD;  Location: [facility];  Service: Orthopedics;  Laterality: Left;   I & D EXTREMITY Left 11/06/2020   Procedure: IRRIGATION AND DEBRIDEMENT EXTREMITY;  Surgeon: Celena Sharper, MD;  Location: [facility] OR;  Service: Orthopedics;  Laterality: Left;   IR CATHETER TUBE CHANGE  11/15/2020   IRRIGATION AND DEBRIDEMENT KNEE  09/22/2020   Procedure: IRRIGATION AND DEBRIDEMENT LEFT KNEE PLACEMENT OF EXTERNAL FIXATION,;  Surgeon: Sharl Selinda Dover, MD;  Location: [facility] OR;  Service: Orthopedics;;   LAPAROTOMY N/A 09/24/2020   Procedure: EXPLORATORY LAPAROTOMY COLOSTOMY  AND REPAIR  FLANK HERNIA;  Surgeon: Signe Mitzie DELENA, MD;  Location: [facility];  Service: General;  Laterality: N/A;   LAPAROTOMY N/A 09/22/2020   Procedure: EXPLORATORY LAPAROTOMY, Ileocecectomy;  Surgeon: Signe Mitzie DELENA, MD;  Location: [facility];  Service: General;  Laterality: N/A;   NERVE TUNNEL DECOMPRESSION, UPPER EXTREMITY Right 05/13/2024   Procedure: NERVE TUNNEL DECOMPRESSION, UPPER EXTREMITY;  Surgeon: Murrell Drivers, MD;  Location: [facility];  Service: Orthopedics;  Laterality: Right;  RIGHT ULNAR NERVE DECOMPRESSION IN FOREARM/WRIST   OPEN REDUCTION INTERNAL FIXATION (ORIF) DISTAL RADIAL  FRACTURE Right 09/26/2020   Procedure: OPEN REDUCTION INTERNAL FIXATION (ORIF) DISTAL RADIUS FRACTURE;  Surgeon: Celena Sharper, MD;  Location: [facility];  Service: Orthopedics;  Laterality: Right;   ORIF FEMUR FRACTURE Left 11/02/2020   Procedure: INCISION DRAINAGE DEEP WOUND LEFT LEG, APPLICATION OF WOUND VAC;  Surgeon: Celena Sharper, MD;  Location: [facility];  Service: Orthopedics;  Laterality: Left;   ORIF FEMUR FRACTURE  Left 12/07/2020   Procedure: OPEN REDUCTION INTERNAL FIXATION (ORIF) DISTAL FEMUR FRACTURE : REPAIR OF LEFT DISTAL FEMUR NONUNION;  Surgeon: Celena Sharper, MD;  Location: [facility];  Service: Orthopedics;  Laterality: Left;   ORIF FEMUR FRACTURE Left 01/24/2022   Procedure: REPAIR LEFT FEMUR NONUNION, QUADRECEPLASTY;  Surgeon: Celena Sharper, MD;  Location: [facility];  Service: Orthopedics;  Laterality: Left;   ORIF TIBIA PLATEAU Bilateral 09/26/2020   Procedure: OPEN REDUCTION INTERNAL FIXATION (ORIF) RIGHT DISTAL FEMUR, RIGHT CALCANEUS, LEFT DISTAL FEMUR, LEFT TIBIAL PLATEAU FRACTURE.  IRRIGATION AND DEBRIDEMENT LEFT LEG; REMOVAL OF EXTERNAL FIXATOR LEFT LEG;  Surgeon: Celena Sharper, MD;  Location: [facility];  Service: Orthopedics;  Laterality: Bilateral;    Social History   Socioeconomic History   Marital status: Single    Spouse name: Not on file   Number of children: Not on file   Years of education: Not on file   Highest education level: Not on file  Occupational History   Not on file  Tobacco Use   Smoking status: Former    Types: Cigarettes   Smokeless tobacco: Former   Tobacco comments:    Quit smoking cigarettes in 2021.  Vaping Use   Vaping status: Every Day   Substances: Nicotine, CBD, Flavoring  Substance and Sexual Activity   Alcohol use: Yes    Comment: occas.   Drug use: Yes    Types: Marijuana    Comment: Last use in  10/2023   Sexual activity: Not Currently    Birth control/protection: I.U.D.    Comment: Mirena  IUD  Other Topics Concern   Not on file  Social History Narrative   ** Merged History Encounter **       Social Drivers of Health   Financial Resource Strain: Not on file  Food Insecurity: Low Risk  (03/17/2024)   Received from [facility]   Hunger Vital Sign    Within the past 12 months, you worried that your food would run out before you got money to buy more: Never true    Within the past 12 months, the food you bought just didn't last and you didn't  have money to get more. : Never true  Transportation Needs: No Transportation Needs (03/17/2024)   Received from [facility]    In the past 12 months, has lack of reliable transportation kept you from medical appointments, meetings, work or from getting things needed for daily living? : No  Physical Activity: Not on file  Stress: Not on file  Social Connections: Not on file  Intimate Partner Violence: Not At Risk (01/20/2024)   Humiliation, Afraid, Rape, and Kick questionnaire    Fear of Current or Ex-Partner: No    Emotionally Abused: No    Physically Abused: No    Sexually Abused: No    Family History  Problem Relation Age of Onset   Breast cancer Maternal Aunt        70/80s   Cancer Maternal Aunt        unsure if uterine or ovarian    Allergies  Allergen Reactions   Azithromycin Diarrhea and Nausea And Vomiting   Benzalkonium Chloride Other (See Comments)    Pt unsure of allergy    Gabapentin  Other (See Comments)    Patient states it makes her loopy   Augmentin [Amoxicillin-Pot Clavulanate] Nausea Only   Ciprofloxacin Nausea Only   Neomycin  Rash   Neosporin [Bacitracin-Polymyxin B] Itching and Rash   Neosporin [Neomycin -Bacitracin Zn-Polymyx] Rash   Septra [Sulfamethoxazole-Trimethoprim] Nausea Only    Outpatient Medications Prior to Visit  Medication Sig   ACAI BERRY PO Take 1 capsule by mouth daily.   acidophilus (RISAQUAD) CAPS capsule Take 1 capsule by mouth daily.   albuterol  (VENTOLIN  HFA) 108 (90 Base) MCG/ACT inhaler Inhale 2 puffs into the lungs every 6 (six) hours as needed for wheezing or shortness of breath.   Cholecalciferol  (VITAMIN D3 PO) Take 1 tablet by mouth daily.   docusate sodium  (COLACE) 100 MG capsule Take 1 capsule (100 mg total) by mouth 2 (two) times daily.   fluticasone  (FLONASE ) 50 MCG/ACT nasal spray Place 1 spray into both nostrils daily.   HYDROcodone -acetaminophen  (NORCO/VICODIN) 5-325 MG tablet Take 1 tablet by  mouth every 6 (six) hours as needed for moderate pain (pain score 4-6).   levonorgestrel  (MIRENA ) 20 MCG/DAY IUD 1 each by Intrauterine route once.   methocarbamol  (ROBAXIN ) 500 MG tablet Take 1 tablet (500 mg total) by mouth 3 (three) times daily as needed for muscle spasms.   Omega-3 Fatty Acids (FISH OIL PO) Take 1 capsule by mouth daily.   spironolactone  (ALDACTONE ) 25 MG tablet Take 1 tablet (25 mg total) by mouth daily.   [DISCONTINUED] ondansetron  (ZOFRAN ) 4 MG tablet TAKE 1 TABLET BY MOUTH EVERY 8 HOURS AS NEEDED FOR NAUSEA OR VOMITING   [DISCONTINUED] tirzepatide  (ZEPBOUND ) 10 MG/0.5ML Pen Inject 10 mg into the skin once a week.   [DISCONTINUED] vortioxetine  HBr (TRINTELLIX ) 20 MG TABS tablet Take 1 tablet (20 mg total) by mouth daily.   No facility-administered medications prior to visit.    Review of Systems  Constitutional: Negative.   HENT: Negative.    Eyes: Negative.   Respiratory: Negative.  Negative for shortness of breath.   Cardiovascular: Negative.  Negative for chest pain.  Gastrointestinal: Negative.  Negative for abdominal pain, constipation and diarrhea.  Genitourinary:  Positive for dysuria, frequency and urgency.  Musculoskeletal:  Negative for joint pain and myalgias.  Skin: Negative.   Neurological: Negative.  Negative for dizziness and headaches.  Endo/Heme/Allergies: Negative.   All other systems reviewed and are negative.      Objective:   BP (!) 94/52   Pulse 72   Ht 5' 5 (1.651 m)   Wt 163 lb (73.9 kg)   SpO2 97%   BMI 27.12 kg/m   Vitals:   11/09/24 1509  BP: (!) 94/52  Pulse: 72  Height: 5' 5 (1.651 m)  Weight: 163 lb (73.9 kg)  SpO2: 97%  BMI (Calculated): 27.12    Physical Exam Vitals and nursing note reviewed.  Constitutional:      Appearance: Normal appearance. She is normal weight.  HENT:     Head: Normocephalic and atraumatic.     Nose: Nose normal.     Mouth/Throat:     Mouth: Mucous membranes are moist.  Eyes:      Extraocular Movements: Extraocular movements intact.     Conjunctiva/sclera: Conjunctivae normal.     Pupils: Pupils are equal, round, and reactive to light.  Cardiovascular:     Rate and  Rhythm: Normal rate and regular rhythm.     Pulses: Normal pulses.     Heart sounds: Normal heart sounds.  Pulmonary:     Effort: Pulmonary effort is normal.     Breath sounds: Normal breath sounds.  Abdominal:     General: Abdomen is flat. Bowel sounds are normal.     Palpations: Abdomen is soft.  Musculoskeletal:        General: Normal range of motion.     Cervical back: Normal range of motion.  Skin:    General: Skin is warm and dry.  Neurological:     General: No focal deficit present.     Mental Status: She is alert and oriented to person, place, and time.  Psychiatric:        Mood and Affect: Mood normal.        Behavior: Behavior normal.        Thought Content: Thought content normal.        Judgment: Judgment normal.      No results found for any visits on 11/09/24.  Recent Results (from the past 2160 hours)  Vitamin D  (25 hydroxy)     Status: None   Collection Time: 11/04/24  2:58 PM  Result Value Ref Range   Vit D, 25-Hydroxy 85.3 30.0 - 100.0 ng/mL    Comment: Vitamin D  deficiency has been defined by the Institute of Medicine and an Endocrine Society practice guideline as a level of serum 25-OH vitamin D  less than 20 ng/mL (1,2). The Endocrine Society went on to further define vitamin D  insufficiency as a level between 21 and 29 ng/mL (2). 1. IOM (Institute of Medicine). 2010. Dietary reference    intakes for calcium  and D. [place]: The    [employer]. 2. Holick MF, Binkley Melvern, Bischoff-Ferrari HA, et al.    Evaluation, treatment, and prevention of vitamin D     deficiency: an Endocrine Society clinical practice    guideline. JCEM. 2011 Jul; 96(7):1911-30.   Hemoglobin A1c     Status: None   Collection Time: 11/04/24  2:58 PM  Result Value Ref Range    Hgb A1c MFr Bld 4.8 4.8 - 5.6 %    Comment:          Prediabetes: 5.7 - 6.4          Diabetes: >6.4          Glycemic control for adults with diabetes: <7.0    Est. average glucose Bld gHb Est-mCnc 91 mg/dL  Lipid panel     Status: None   Collection Time: 11/04/24  2:58 PM  Result Value Ref Range   Cholesterol, Total 116 100 - 199 mg/dL   Triglycerides 74 0 - 149 mg/dL   HDL 41 >60 mg/dL   VLDL Cholesterol Cal 15 5 - 40 mg/dL   LDL Chol Calc (NIH) 60 0 - 99 mg/dL   Chol/HDL Ratio 2.8 0.0 - 4.4 ratio    Comment:                                   T. Chol/HDL Ratio                                             Men  Women  1/2 Avg.Risk  3.4    3.3                                   Avg.Risk  5.0    4.4                                2X Avg.Risk  9.6    7.1                                3X Avg.Risk 23.4   11.0   CMP14+EGFR     Status: Abnormal   Collection Time: 11/04/24  2:58 PM  Result Value Ref Range   Glucose 105 (H) 70 - 99 mg/dL   BUN 9 6 - 24 mg/dL   Creatinine, Ser 9.04 0.57 - 1.00 mg/dL   eGFR 70 >40 fO/fpw/8.26   BUN/Creatinine Ratio 9 9 - 23   Sodium 144 134 - 144 mmol/L   Potassium 3.6 3.5 - 5.2 mmol/L   Chloride 107 (H) 96 - 106 mmol/L   CO2 20 20 - 29 mmol/L   Calcium  8.8 8.7 - 10.2 mg/dL   Total Protein 5.8 (L) 6.0 - 8.5 g/dL   Albumin  3.6 (L) 3.8 - 4.9 g/dL   Globulin, Total 2.2 1.5 - 4.5 g/dL   Bilirubin Total 0.5 0.0 - 1.2 mg/dL   Alkaline Phosphatase 70 49 - 135 IU/L   AST 30 0 - 40 IU/L   ALT 25 0 - 32 IU/L  CBC with Differential/Platelet     Status: None   Collection Time: 11/04/24  2:58 PM  Result Value Ref Range   WBC 6.5 3.4 - 10.8 x10E3/uL   RBC 4.15 3.77 - 5.28 x10E6/uL   Hemoglobin 13.2 11.1 - 15.9 g/dL   Hematocrit 60.0 65.9 - 46.6 %   MCV 96 79 - 97 fL   MCH 31.8 26.6 - 33.0 pg   MCHC 33.1 31.5 - 35.7 g/dL   RDW 87.1 88.2 - 84.5 %   Platelets 166 150 - 450 x10E3/uL   Neutrophils 77 Not Estab. %   Lymphs 15 Not  Estab. %   Monocytes 6 Not Estab. %   Eos 1 Not Estab. %   Basos 1 Not Estab. %   Neutrophils Absolute 4.9 1.4 - 7.0 x10E3/uL   Lymphocytes Absolute 1.0 0.7 - 3.1 x10E3/uL   Monocytes Absolute 0.4 0.1 - 0.9 x10E3/uL   EOS (ABSOLUTE) 0.1 0.0 - 0.4 x10E3/uL   Basophils Absolute 0.1 0.0 - 0.2 x10E3/uL   Immature Granulocytes 0 Not Estab. %   Immature Grans (Abs) 0.0 0.0 - 0.1 x10E3/uL  TSH     Status: None   Collection Time: 11/04/24  2:58 PM  Result Value Ref Range   TSH 2.150 0.450 - 4.500 uIU/mL  POCT urinalysis dipstick     Status: Abnormal   Collection Time: 11/04/24  3:05 PM  Result Value Ref Range   Color, UA     Clarity, UA     Glucose, UA Negative Negative   Bilirubin, UA Positive    Ketones, UA Negative    Spec Grav, UA >=1.030 (A) 1.010 - 1.025   Blood, UA Negative    pH, UA 6.0 5.0 - 8.0   Protein, UA Positive (A) Negative   Urobilinogen,  UA 0.2 0.2 or 1.0 E.U./dL   Nitrite, UA Negative    Leukocytes, UA Negative Negative   Appearance     Odor    Urine Culture     Status: Abnormal   Collection Time: 11/04/24  3:42 PM   Specimen: Urine   UR  Result Value Ref Range   Urine Culture, Routine Final report (A)    Organism ID, Bacteria Escherichia coli (A)     Comment: Cefazolin  with an MIC <=16 predicts susceptibility to the oral agents cefaclor, cefdinir, cefpodoxime, cefprozil, cefuroxime, cephalexin , and loracarbef when used for therapy of uncomplicated urinary tract infections due to E. coli, Klebsiella pneumoniae, and Proteus mirabilis. Greater than 100,000 colony forming units per mL    Antimicrobial Susceptibility Comment     Comment:       ** S = Susceptible; I = Intermediate; R = Resistant **                    P = Positive; N = Negative             MICS are expressed in micrograms per mL    Antibiotic                 RSLT#1    RSLT#2    RSLT#3    RSLT#4 Amoxicillin/Clavulanic Acid    S Ampicillin                      S Cefazolin                        S Cefepime                        S Cefoxitin                      S Cefpodoxime                    S Ceftriaxone                     S Ciprofloxacin                  S Ertapenem                      S Gentamicin                     S Levofloxacin                    S Meropenem                       S Nitrofurantoin                  S Piperacillin /Tazobactam        S Tetracycline                   S Tobramycin                      S Trimethoprim/Sulfa             S       Assessment & Plan:  Referral sent to dermatology Urine culture Doxycycline  MRI of the head Mammogram Wegovy  Continue all other medications  Problem [name] Items Addressed This Visit  Nervous and Auditory   Vertigo of central origin   Relevant Orders   CT HEAD WO CONTRAST ( )     Musculoskeletal and Integument   Hidradenitis suppurativa   Relevant Orders   Ambulatory referral to Dermatology     Genitourinary   Acute cystitis without hematuria   Other Visit Diagnoses       Breast cancer screening by mammogram    -  Primary   Relevant Orders   MM 3D SCREENING MAMMOGRAM BILATERAL BREAST       Return in about 4 months (around 03/09/2025) for fasting labs prior.   Total time spent: 25 minutes. This time includes review of previous notes and results and patient face to face interaction during today's visit.    Jeoffrey Pollen, NP  11/09/2024   This document may have been prepared by Dragon Voice Recognition software and as such may include unintentional dictation errors.

## 2024-11-10 ENCOUNTER — Ambulatory Visit: Payer: Self-pay | Admitting: Cardiology

## 2024-11-10 ENCOUNTER — Telehealth: Payer: Self-pay | Admitting: Cardiology

## 2024-11-10 NOTE — Telephone Encounter (Signed)
 Called to get order changed for head ct w/wo. Order needs to be sent for head ct w/o only. Please place new order.

## 2024-11-11 DIAGNOSIS — L732 Hidradenitis suppurativa: Secondary | ICD-10-CM | POA: Insufficient documentation

## 2024-11-12 ENCOUNTER — Encounter: Payer: Self-pay | Admitting: Cardiology

## 2024-11-12 NOTE — Telephone Encounter (Signed)
 See other message

## 2024-11-16 ENCOUNTER — Other Ambulatory Visit

## 2024-11-22 ENCOUNTER — Encounter: Payer: Self-pay | Admitting: Cardiology

## 2024-11-22 ENCOUNTER — Encounter (INDEPENDENT_AMBULATORY_CARE_PROVIDER_SITE_OTHER): Payer: Self-pay

## 2024-11-26 ENCOUNTER — Encounter: Payer: Self-pay | Admitting: Cardiology

## 2024-11-29 ENCOUNTER — Other Ambulatory Visit: Payer: Self-pay | Admitting: Cardiology

## 2024-11-29 MED ORDER — DOXYCYCLINE HYCLATE 100 MG PO TABS: 100.0000 mg | ORAL_TABLET | Freq: Two times a day (BID) | ORAL | 0 refills | Status: AC

## 2024-11-29 NOTE — Progress Notes (Signed)
 Opened in erro

## 2024-11-30 ENCOUNTER — Ambulatory Visit

## 2024-11-30 DIAGNOSIS — L814 Other melanin hyperpigmentation: Secondary | ICD-10-CM

## 2024-11-30 DIAGNOSIS — L732 Hidradenitis suppurativa: Secondary | ICD-10-CM | POA: Diagnosis not present

## 2024-11-30 DIAGNOSIS — D692 Other nonthrombocytopenic purpura: Secondary | ICD-10-CM | POA: Diagnosis not present

## 2024-11-30 MED ORDER — COSENTYX UNOREADY 300 MG/2ML ~~LOC~~ SOAJ: 300.0000 mg | SUBCUTANEOUS | 0 refills | Status: AC

## 2024-11-30 MED ORDER — COSENTYX UNOREADY 300 MG/2ML ~~LOC~~ SOAJ: 300.0000 mg | SUBCUTANEOUS | 8 refills | Status: AC

## 2024-11-30 NOTE — Addendum Note (Signed)
 Addended by: CARIN GAUZE on: 11/30/2024 12:42 PM   Modules accepted: Orders

## 2024-11-30 NOTE — Patient Instructions (Addendum)
 [name] Your prescription has been sent to [facility].  [facility] will call you to obtain additional  information needed to fill your prescription such as:  Additional insurance information Payment for any out-of-pocket expense (may require credit card) Address for drug delivery shipment  A timely response to the pharmacy may help you obtain your medication more quickly. Please call [name] at [phone].    Hidradentis Suppurativa (pronounced "high-drad-en-eye-tis/sup-your-uh-tee-vah") is a chronic disease of hair follicles.  The lesions occur most commonly on areas of skin-to-skin contact: under the arms (axillary area), in the groin, around the buttocks, in the region around the anus and genitals, and on the skin between and under the breasts. In women, the underarms, groin, and breast areas are most commonly affected. Men most often have HS lesions on the buttocks and under the arms and may also have HS at the back of the neck and behind and around the ears.  What does HS look and feel like?  The first thing that someone with HS notices is a tender, raised, red bump that looks like an under-the-skin pimple or boil. Sometimes HS lesions have two or more "heads."  In mild disease only an occasional boil or abscess may occur, but in more active disease there can be many new lesions every month.  Some abscesses can become larger and may open and drain pus.  Bleeding and increased odor can also occur. In severe disease, deeper abscesses develop and may connect with each other under the skin to form tunnel-like tracts (sinuses, fistulas).  These may drain constantly, or may temporarily improve and then usually begin draining again over time.  In people who have had sinus tracts for some time, scars form that feel like ropes under the skin. In the very worst cases, networks of sinus tracts can form deeper in the body, including the muscle and other  tissues. Many people with severe HS have scars that can limit their ability to freely move their arms or legs, though this is very unlikely for most patients.   Clinicians usually classify or "grade" HS using the [facility] staging system according to the severity of the disease for each body location:  [place] stage I: one or more abscesses are present, but no sinus tracts have formed and no scars have developed  [place] stage II: one or more abscesses are present that resolve and recur; on sinus tract can be present and scarring is seen  [place] stage III: many abscesses and more than one sinus tract is present with extensive scars.  What causes HS? The cause of HS is not completely understood.  It seems to be a disorder of hair follicles and often many family members are affected so genetics probably play a strong role.  Bacteria are often present and may make the disease worse, but infection does not seem to be the main cause. Hormones are also likely play a role since the condition typically starts around puberty when hair follicles under the arms and in the groin start to change.  It can sometimes flare with menstrual cycles in women as well.  In most cases it lasts for decades and starts to improve to some extent in the late 30s and 40s as long as many fistulas have not already formed.  Women are three times more likely than men to develop HS.  Other factors are known to contribute to HS flaring or becoming worse, though they are likely not the  main causes. The factors most commonly associated with HS include:  Cigarette smoking - Stopping smoking will likely not cure the disease, but likely is helpful in reducing how much and how often it flares and may prevent it from getting as bad over time.  Higher weight - HS may occur even in people that are not overweight, but it is much more common in patients that are.  There is some evidence that losing weight and eating a diet low in sugars and fats may be  helpful in improving hidradenitis, though this is not helpful for everyone.  Working with a nutritionist may be an important way to help with this and is something your physician can help coordinate  Hidradenitis is not contagious.  It is not caused by a problem with personal hygiene or any other activity or behavior of those with the disease.  How can your doctor help you treat your hidradenitis? Clinicians use both medication and surgery to treat HS. The choice of treatment--or combination of treatments--is made according to an individual patient's needs. Clinicians consider several factors in determining the most appropriate plan for therapy:  Severity of disease - medications and some laser treatments are usually able to control disease best when fistulas are not present.  Fistulas typically require surgery.  Extent and location of disease  Chronicity (how often the lesions recur)  A number of different surgical methods have been developed that are useful for certain patients under particular circumstances. These can be done with local numbing and healing at home for some areas when disease is not too extensive with relatively brief recovery times.  In more extensive disease there may be a need for larger excisions under general anesthesia with healing time in the hospital and prolonged recovery periods for better disease control.    In addition, many medical treatments have been tried--some with more success than others. No medication is effective for all patients, and you and your doctor may have to try several different treatments or combinations of treatments before you find the treatment plan that works best for you. The goals of therapy with medications that are either topical (used on the skin) or systemic (taken by mouth) are: 1. to clear the lesions or at least reduce their number and extent, and 2. to prevent new lesions from forming. 3. To reduce pain, drainage, and odor Some of the  types of medications commonly used are antibacterial skin washes and the topical antibiotics to prevent secondary infections and corticosteroid injections into the lesions to reduce inflammation.   Other medications that may be used include retinoids (similar to Accutane), drugs that effect how hormones and hair follicles interact, drugs that affect your immune system (such as methotrexate, adalimumab/Humira, and Remicaid/infliximab), steroids, and oral antibiotics.  Lasers that destroy hair follicles can also be helpful since they reduce the hair follicles that cause the problems.  Multiple treatments are typically required over time and there is some discomfort associated with treatment, but it is typically very fast and well-tolerated.  It is very important to realize that hidradenitis cannot usually be completely cured with any single medication or surgical procedure.  It is a disease that can be very stubborn and difficult to control, but with good treatment a lot of improvement and sometimes temporary remissions can be obtained. Poorly controlled disease can cause more fistulas to form and make managing the disease much more difficult over time so it is important to seek care to reduce major flares.  Surgery  can provide a long term cure in some areas, though the disease can start again or continue in nearby areas.  A dermatologist is often the best person to help coordinate disease treatment, and sometimes other surgeons, pain specialists, other specialists, and nutritionists may be part of the treatment team.  For severe disease, the first goal is often to reduce pain and symptoms with medicines so that the disease feels more stable. Once it's stable, we often start thinking about how to address areas that have completely gotten better with surgery if they are still causing problems.  What can you do to help your HS? 1. Stopping smoking is hard and may not fix everything, but it may be a step in  the right direction.  We or your primary care physician can provide resources to help stop if you are interested. 2. Follow a healthy diet and try to achieve a healthy weight. Some other self-help measures are:  Keep your skin cool and dry (becoming overheated and sweating can contribute to an HS flare)  To reduce the pain of cysts or nodules or to help them to drain, apply hot compresses or soak in hot water for 10 minutes at a time (use a clean washcloth or a teabag soaked in hot water)  For female patients, cotton underwear that does not have tight elastic in the groin can be helpful.  Boyshort, brief, or boxer style underwear may be a better option as friction on hair follicles in affected areas can be a major trigger in some patients.  These can be easily found on [place] or with some retailers.  Fruit of the Loom and Underworks are two brands that are sometimes recommended.  Finally, know that you are not alone. Coping with the pain and other symptoms of HS can be very difficult, so it may be helpful to connect with others who live with HS. Patient groups and networks can be sources of important information and support. Some internet resources for information and connections are provided below.  Psychologytoday.com is a resource to find psychologists and therapists that can help support you in your are   [email or web address].tn can help connect with sexual health resources and counselors  Resources for Information  The Hidradenitis Suppurativa Foundation: A nonprofit organized by a group of physicians interested in treating and advancing research in hidradenitis suppurativa.  This group advocates for better care and research for hidradenitis and has educational materials put together specifically for patients that have been reviewed and produced by doctors and people with hidradenitis.  American Academy of  Dermatology ARanked.fi  [employer] ElevatorPitchers.de.html NORD: IT trainer for Rare Disorders[employer] https://www.rarediseases.org/rare-disease-information/rare-diseases/byID/358/viewAbstract Trials of new medications for HS Https://www.clinicaltrials.gov     Due to recent changes in healthcare laws, you may see results of your pathology and/or laboratory studies on MyChart before the doctors have had a chance to review them. We understand that in some cases there may be results that are confusing or concerning to you. Please understand that not all results are received at the same time and often the doctors may need to interpret multiple results in order to provide you with the best plan of care or course of treatment. Therefore, we ask that you please give us  2 business days to thoroughly review all your results before contacting the office for clarification. Should we see a critical lab result, you will be contacted sooner.   If You Need Anything After Your Visit  If you have any questions  or concerns for your doctor, please call our main line at [phone] and press option 4 to reach your doctor's medical assistant. If no one answers, please leave a voicemail as directed and we will return your call as soon as possible. Messages left after 4 pm will be answered the following business day.   You may also send us  a message via MyChart. We typically respond to MyChart messages within 1-2 business days.  For prescription refills, please ask your pharmacy to contact our office. Our fax number is [phone].  If you have an urgent issue when the clinic is closed that cannot wait until the next business day, you can page your doctor at the number below.    Please note that while we do our best to be available for urgent  issues outside of office hours, we are not available 24/7.   If you have an urgent issue and are unable to reach us , you may choose to seek medical care at your doctor's office, retail clinic, urgent care center, or emergency room.  If you have a medical emergency, please immediately call 911 or go to the emergency department.  Pager Numbers  - Dr. Hester: [phone]  - Dr. Jackquline: [phone]  - Dr. Claudene: [phone]   - Dr. Raymund: [phone]  In the event of inclement weather, please call our main line at [phone] for an update on the status of any delays or closures.  Dermatology Medication Tips: Please keep the boxes that topical medications come in in order to help keep track of the instructions about where and how to use these. Pharmacies typically print the medication instructions only on the boxes and not directly on the medication tubes.   If your medication is too expensive, please contact our office at [phone] option 4 or send us  a message through MyChart.   We are unable to tell what your co-pay for medications will be in advance as this is different depending on your insurance coverage. However, we may be able to find a substitute medication at lower cost or fill out paperwork to get insurance to cover a needed medication.   If a prior authorization is required to get your medication covered by your insurance company, please allow us  1-2 business days to complete this process.  Drug prices often vary depending on where the prescription is filled and some pharmacies may offer cheaper prices.  The website www.goodrx.com contains coupons for medications through different pharmacies. The prices here do not account for what the cost may be with help from insurance (it may be cheaper with your insurance), but the website can give you the price if you did not use any insurance.  - You can print the associated coupon and take it with your prescription to the  pharmacy.  - You may also stop by our office during regular business hours and pick up a GoodRx coupon card.  - If you need your prescription sent electronically to a different pharmacy, notify our office through [facility] or by phone at [phone] option 4.     Si Usted Necesita Algo Despus de Su Visita  Tambin puede enviarnos un mensaje a travs de Clinical cytogeneticist. Por lo general respondemos a los mensajes de MyChart en el transcurso de 1 a 2 das hbiles.  Para renovar recetas, por favor pida a su farmacia que se ponga en contacto con nuestra oficina. Randi lakes de fax es [place] [phone].  Si tiene un asunto urgente cuando la [employer]  est cerrada y que no puede esperar hasta el siguiente da hbil, puede llamar/localizar a su doctor(a) al nmero que aparece a continuacin.   Por favor, tenga en cuenta que aunque hacemos todo lo posible para estar disponibles para asuntos urgentes fuera del horario de [place], no estamos disponibles las 24 horas del da, los 7 [address]  [address] de la [place].   Si tiene un problema urgente y no puede comunicarse con nosotros, puede optar por buscar atencin mdica  en el consultorio de su doctor(a), en una clnica privada, en un centro de atencin urgente o en una sala de emergencias.  Si tiene Engineer, drilling, por favor llame inmediatamente al 911 o vaya a la sala de emergencias.  Nmeros de bper  - Dr. Hester: [phone]  - Dra. Jackquline: [phone]  - Dr. Claudene: [phone]  - Dra. Kitts: [phone]  En caso de inclemencias del [place], por favor llame a nuestra lnea principal al [phone] para una actualizacin sobre el estado de cualquier retraso o cierre.  Consejos para la medicacin en dermatologa: Por favor, guarde las cajas en las que vienen los medicamentos de uso tpico para ayudarle a seguir las instrucciones sobre dnde y cmo usarlos. Las farmacias generalmente imprimen las instrucciones del medicamento slo en las  cajas y no directamente en los tubos del [place].   Si su medicamento es muy caro, por favor, pngase en contacto con landry rieger llamando al [phone] y presione la opcin 4 o envenos un mensaje a travs de Clinical cytogeneticist.   No podemos decirle cul ser su copago por los medicamentos por adelantado ya que esto es diferente dependiendo de la cobertura de su seguro. Sin embargo, es posible que podamos encontrar un medicamento sustituto a Audiological scientist un formulario para que el seguro cubra el medicamento que se considera necesario.   Si se requiere una autorizacin previa para que su compaa de seguros malta su medicamento, por favor permtanos de 1 a 2 das hbiles para completar este proceso.  Los precios de los medicamentos varan con frecuencia dependiendo del Environmental consultant de dnde se surte la receta y alguna farmacias pueden ofrecer precios ms baratos.  El sitio web www.goodrx.com tiene cupones para medicamentos de Health and safety inspector. Los precios aqu no tienen en cuenta lo que podra costar con la ayuda del seguro (puede ser ms barato con su seguro), pero el sitio web puede darle el precio si no utiliz Tourist information centre manager.  - Puede imprimir el cupn correspondiente y llevarlo con su receta a la farmacia.  - Tambin puede pasar por nuestra oficina durante el horario de atencin regular y Education officer, museum una tarjeta de cupones de GoodRx.  - Si necesita que su receta se enve electrnicamente a una farmacia diferente, informe a nuestra oficina a travs de MyChart de [place] o por telfono llamando al [phone] y presione la opcin 4.
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
## 2024-12-02 ENCOUNTER — Ambulatory Visit: Payer: Self-pay

## 2024-12-02 LAB — CBC WITH DIFFERENTIAL/PLATELET
Basophils Absolute: 0.1 x10E3/uL (ref 0.0–0.2)
Basos: 1 %
EOS (ABSOLUTE): 0.1 x10E3/uL (ref 0.0–0.4)
Eos: 1 %
Hematocrit: 42 % (ref 34.0–46.6)
Hemoglobin: 13.2 g/dL (ref 11.1–15.9)
Immature Grans (Abs): 0 x10E3/uL (ref 0.0–0.1)
Immature Granulocytes: 0 %
Lymphocytes Absolute: 1.2 x10E3/uL (ref 0.7–3.1)
Lymphs: 15 %
MCH: 31.4 pg (ref 26.6–33.0)
MCHC: 31.4 g/dL — ABNORMAL LOW (ref 31.5–35.7)
MCV: 100 fL — ABNORMAL HIGH (ref 79–97)
Monocytes Absolute: 0.6 x10E3/uL (ref 0.1–0.9)
Monocytes: 7 %
Neutrophils Absolute: 6.5 x10E3/uL (ref 1.4–7.0)
Neutrophils: 76 %
Platelets: 219 x10E3/uL (ref 150–450)
RBC: 4.21 x10E6/uL (ref 3.77–5.28)
RDW: 13 % (ref 11.7–15.4)
WBC: 8.5 x10E3/uL (ref 3.4–10.8)

## 2024-12-02 LAB — HIV ANTIBODY (ROUTINE TESTING W REFLEX): HIV Screen 4th Generation wRfx: NONREACTIVE

## 2024-12-02 LAB — HEPATITIS B SURFACE ANTIBODY,QUALITATIVE: Hep B Surface Ab, Qual: NONREACTIVE

## 2024-12-02 LAB — QUANTIFERON-TB GOLD PLUS
QuantiFERON Mitogen Value: >10 [IU]/mL
QuantiFERON Nil Value: 0.02 [IU]/mL
QuantiFERON TB1 Ag Value: 0.01 [IU]/mL
QuantiFERON TB2 Ag Value: 0.01 [IU]/mL
QuantiFERON-TB Gold Plus: NEGATIVE

## 2024-12-02 LAB — HEPATITIS B CORE ANTIBODY, TOTAL: Hep B Core Total Ab: NEGATIVE

## 2024-12-02 LAB — CREATININE WITH EST GFR
Creatinine, Ser: 0.85 mg/dL (ref 0.57–1.00)
eGFR: 80 mL/min/1.73

## 2024-12-02 LAB — AST: AST: 31 IU/L (ref 0–40)

## 2024-12-02 LAB — ALT: ALT: 43 IU/L — ABNORMAL HIGH (ref 0–32)

## 2024-12-02 LAB — BUN: BUN: 13 mg/dL (ref 6–24)

## 2024-12-02 LAB — HEPATITIS B SURFACE ANTIGEN: Hepatitis B Surface Ag: NEGATIVE

## 2024-12-02 LAB — HEPATITIS C ANTIBODY: Hep C Virus Ab: NONREACTIVE

## 2024-12-02 NOTE — Telephone Encounter (Signed)
-----   Message from Lauraine JAYSON Kanaris sent at 12/02/2024  1:07 PM EST ----- Please notify patient with below plan: Labs with slight elevation in liver test/ALT - she should follow with PCP for this. Otherwise normal  - please see if she discussed w/ her surgeon regarding initiation of cosentyx  ----- Message ----- From: Interface, [facility] Lab Results In Sent: 12/02/2024   7:37 AM EST To: Lauraine JAYSON Kanaris, MD

## 2024-12-02 NOTE — Telephone Encounter (Signed)
 Left voicemail for patient to return my call.

## 2024-12-14 ENCOUNTER — Encounter: Payer: Self-pay | Admitting: Cardiology

## 2024-12-14 NOTE — Telephone Encounter (Signed)
-----   Message from Lauraine Kanaris, MD sent at 12/02/2024  1:07 PM EST ----- Please notify patient with below plan: Labs with slight elevation in liver test/ALT - she should follow with PCP for this. Otherwise normal  - please see if she discussed w/ her surgeon regarding initiation of cosentyx   ----- Message ----- From: Interface, [facility] Lab Results In Sent: 12/02/2024   7:37 AM EST To: Lauraine JAYSON Kanaris, MD

## 2024-12-14 NOTE — Telephone Encounter (Signed)
 Patient made aware of lab results. She has reached out to her surgeon but has not heard back from her at this time. aw

## 2024-12-18 ENCOUNTER — Other Ambulatory Visit: Payer: Self-pay | Admitting: Cardiology

## 2025-01-06 ENCOUNTER — Other Ambulatory Visit: Payer: Self-pay | Admitting: Cardiology

## 2025-01-06 ENCOUNTER — Encounter: Payer: Self-pay | Admitting: Cardiology

## 2025-01-06 DIAGNOSIS — R300 Dysuria: Secondary | ICD-10-CM

## 2025-01-07 ENCOUNTER — Ambulatory Visit: Payer: Self-pay | Admitting: Cardiology

## 2025-01-07 ENCOUNTER — Other Ambulatory Visit: Payer: Self-pay | Admitting: Cardiology

## 2025-01-07 LAB — URINALYSIS
Bilirubin, UA: NEGATIVE
Glucose, UA: NEGATIVE
Ketones, UA: NEGATIVE
Nitrite, UA: POSITIVE — AB
Protein,UA: NEGATIVE
RBC, UA: NEGATIVE
Specific Gravity, UA: 1.022 (ref 1.005–1.030)
Urobilinogen, Ur: 0.2 mg/dL (ref 0.2–1.0)
pH, UA: 5.5 (ref 5.0–7.5)

## 2025-01-07 MED ORDER — ZEPBOUND 2.5 MG/0.5ML ~~LOC~~ SOAJ: 2.5000 mg | SUBCUTANEOUS | 3 refills | Status: DC

## 2025-01-07 MED ORDER — NITROFURANTOIN MONOHYD MACRO 100 MG PO CAPS: 100.0000 mg | ORAL_CAPSULE | Freq: Two times a day (BID) | ORAL | 0 refills | Status: AC

## 2025-01-10 ENCOUNTER — Encounter: Payer: Self-pay | Admitting: Cardiology

## 2025-01-11 ENCOUNTER — Encounter: Payer: Self-pay | Admitting: Cardiology

## 2025-01-13 ENCOUNTER — Encounter: Payer: Self-pay | Admitting: Cardiology

## 2025-01-14 ENCOUNTER — Other Ambulatory Visit: Payer: Self-pay | Admitting: Cardiology

## 2025-01-14 MED ORDER — DOXYCYCLINE HYCLATE 100 MG PO TABS: 100.0000 mg | ORAL_TABLET | Freq: Two times a day (BID) | ORAL | 0 refills | Status: AC

## 2025-01-17 ENCOUNTER — Other Ambulatory Visit: Payer: Self-pay | Admitting: Cardiology

## 2025-01-17 MED ORDER — CLINDAMYCIN PHOS (TWICE-DAILY) 1 % EX GEL: Freq: Two times a day (BID) | CUTANEOUS | 0 refills | Status: AC

## 2025-01-18 ENCOUNTER — Encounter: Payer: Self-pay | Admitting: Cardiology

## 2025-01-20 ENCOUNTER — Ambulatory Visit

## 2025-01-21 ENCOUNTER — Ambulatory Visit: Admitting: Cardiology

## 2025-01-21 DIAGNOSIS — R300 Dysuria: Secondary | ICD-10-CM

## 2025-01-21 LAB — POCT URINALYSIS DIPSTICK
Bilirubin, UA: POSITIVE
Blood, UA: NEGATIVE
Glucose, UA: NEGATIVE
Ketones, UA: NEGATIVE
Leukocytes, UA: NEGATIVE
Nitrite, UA: NEGATIVE
Protein, UA: NEGATIVE
Spec Grav, UA: >=1.03 — AB
Urobilinogen, UA: 0.2 U/dL
pH, UA: 6

## 2025-01-24 LAB — URINE CULTURE

## 2025-01-27 ENCOUNTER — Encounter: Payer: Self-pay | Admitting: Cardiology

## 2025-01-27 ENCOUNTER — Other Ambulatory Visit: Payer: Self-pay | Admitting: Cardiology

## 2025-01-27 MED ORDER — WEGOVY 0.25 MG/0.5ML ~~LOC~~ SOAJ: 0.2500 mg | SUBCUTANEOUS | 3 refills | Status: DC

## 2025-02-04 ENCOUNTER — Encounter: Payer: Self-pay | Admitting: Cardiology

## 2025-02-04 ENCOUNTER — Ambulatory Visit: Admitting: Cardiology

## 2025-02-04 VITALS — BP 92/58 | HR 70 | Ht 65.0 in | Wt 163.0 lb

## 2025-02-04 DIAGNOSIS — E663 Overweight: Secondary | ICD-10-CM | POA: Insufficient documentation

## 2025-02-04 DIAGNOSIS — Z713 Dietary counseling and surveillance: Secondary | ICD-10-CM

## 2025-02-04 MED ORDER — WEGOVY 0.25 MG/0.5ML ~~LOC~~ SOAJ: 0.2500 mg | SUBCUTANEOUS | 3 refills | Status: AC

## 2025-02-04 NOTE — Addendum Note (Signed)
 Addended by: CARIN GAUZE on: 02/04/2025 03:13 PM   Modules accepted: Orders

## 2025-02-04 NOTE — Progress Notes (Signed)
 "  Established Patient Office Visit  Subjective:  Patient ID: Sara [name], female    DOB: 10/23/1966  Age: 59 y.o. MRN: [id number]  Chief Complaint  Patient presents with   Follow-up    Weight check for wegovy     Patient in office for follow up. Needs a weight check for Wegovy  PA. Patient scheduled for ventral hernia repair in 05/2025. Surgeon is requesting patient lose more weight. Patient's weight has been stable at 163 lbs since 10/2024.  Blood pressure controlled.  Continue current medications.     No other concerns at this time.   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    Past Surgical History:  Procedure Laterality Date   COLON SURGERY     COLONOSCOPY WITH PROPOFOL  N/A 03/25/2022   Procedure: COLONOSCOPY WITH PROPOFOL ;  Surgeon: Unk Corinn Skiff, MD;  Location: [facility];  Service: Gastroenterology;  Laterality: N/A;   COLOSTOMY REVERSAL N/A 03/27/2022   Procedure: COLOSTOMY TAKEDOWN VENTRAL HERNIA REPAIR;  Surgeon: Lyndel Deward PARAS, MD;  Location: [facility];  Service: General;  Laterality: N/A;   FEMUR IM NAIL Left 01/20/2024   Procedure: INTRAMEDULLARY (IM) NAIL FEMORAL WITH GRAFT;  Surgeon: Celena Sharper, MD;  Location: [facility];  Service: Orthopedics;  Laterality: Left;   I & D EXTREMITY Left 11/06/2020   Procedure: IRRIGATION AND DEBRIDEMENT EXTREMITY;  Surgeon: Celena Sharper, MD;  Location: [facility] OR;  Service: Orthopedics;  Laterality: Left;   IR CATHETER TUBE CHANGE  11/15/2020   IRRIGATION AND DEBRIDEMENT KNEE  09/22/2020   Procedure: IRRIGATION AND DEBRIDEMENT LEFT KNEE PLACEMENT OF EXTERNAL FIXATION,;  Surgeon: Sharl Selinda Dover, MD;  Location: [facility] OR;  Service:  Orthopedics;;   LAPAROTOMY N/A 09/24/2020   Procedure: EXPLORATORY LAPAROTOMY COLOSTOMY  AND REPAIR  FLANK HERNIA;  Surgeon: Signe Mitzie DELENA, MD;  Location: [facility];  Service: General;  Laterality: N/A;   LAPAROTOMY N/A 09/22/2020   Procedure: EXPLORATORY LAPAROTOMY, Ileocecectomy;  Surgeon: Signe Mitzie DELENA, MD;  Location: [facility];  Service: General;  Laterality: N/A;   NERVE TUNNEL DECOMPRESSION, UPPER EXTREMITY Right 05/13/2024   Procedure: NERVE TUNNEL DECOMPRESSION, UPPER EXTREMITY;  Surgeon: Murrell Drivers, MD;  Location: [facility];  Service: Orthopedics;  Laterality: Right;  RIGHT ULNAR NERVE DECOMPRESSION IN FOREARM/WRIST   OPEN REDUCTION INTERNAL FIXATION (ORIF) DISTAL RADIAL FRACTURE Right 09/26/2020   Procedure: OPEN REDUCTION INTERNAL FIXATION (ORIF) DISTAL RADIUS FRACTURE;  Surgeon: Celena Sharper, MD;  Location: [facility];  Service: Orthopedics;  Laterality: Right;   ORIF FEMUR FRACTURE Left 11/02/2020   Procedure: INCISION DRAINAGE DEEP WOUND LEFT LEG, APPLICATION OF WOUND VAC;  Surgeon: Celena Sharper, MD;  Location: [facility];  Service: Orthopedics;  Laterality: Left;   ORIF FEMUR FRACTURE Left 12/07/2020   Procedure: OPEN REDUCTION INTERNAL FIXATION (ORIF) DISTAL FEMUR FRACTURE : REPAIR OF LEFT DISTAL FEMUR NONUNION;  Surgeon: Celena Sharper, MD;  Location: [facility];  Service: Orthopedics;  Laterality: Left;   ORIF FEMUR FRACTURE Left 01/24/2022   Procedure: REPAIR LEFT FEMUR NONUNION, QUADRECEPLASTY;  Surgeon: Celena Sharper, MD;  Location: [facility];  Service: Orthopedics;  Laterality:  Left;   ORIF TIBIA PLATEAU Bilateral 09/26/2020   Procedure: OPEN REDUCTION INTERNAL FIXATION (ORIF) RIGHT DISTAL FEMUR, RIGHT CALCANEUS, LEFT DISTAL FEMUR, LEFT TIBIAL PLATEAU FRACTURE.  IRRIGATION AND DEBRIDEMENT LEFT LEG; REMOVAL OF EXTERNAL FIXATOR LEFT LEG;  Surgeon: Celena Sharper, MD;  Location: [facility];  Service: Orthopedics;  Laterality: Bilateral;    Social History   Socioeconomic History    Marital status: Single    Spouse name: Not on file   Number of children: Not on file   Years of education: Not on file   Highest education level: Not on file  Occupational History   Not on file  Tobacco Use   Smoking status: Former    Types: Cigarettes   Smokeless tobacco: Former   Tobacco comments:    Quit smoking cigarettes in 2021.  Vaping Use   Vaping status: Every Day   Substances: Nicotine, CBD, Flavoring  Substance and Sexual Activity   Alcohol use: Yes    Comment: occas.   Drug use: Yes    Types: Marijuana    Comment: Last use in  10/2023   Sexual activity: Not Currently    Birth control/protection: I.U.D.    Comment: Mirena  IUD  Other Topics Concern   Not on file  Social History Narrative   ** Merged History Encounter **       Social Drivers of Health   Tobacco Use: Medium Risk (02/04/2025)   Patient History    Smoking Tobacco Use: Former    Smokeless Tobacco Use: Former    Passive Exposure: Not on Actuary Strain: Not on file  Food Insecurity: Low Risk (03/17/2024)   Received from [facility]   Epic    Within the past 12 months, you worried that your food would run out before you got money to buy more: Never true    Within the past 12 months, the food you bought just didn't last and you didn't have money to get more. : Never true  Transportation Needs: No Transportation Needs (03/17/2024)   Received from [facility]    In the past 12 months, has lack of reliable transportation kept you from medical appointments, meetings, work or from getting things needed for daily living? : No  Physical Activity: Not on file  Stress: Not on file  Social Connections: Not on file  Intimate Partner Violence: Not At Risk (01/20/2024)   Humiliation, Afraid, Rape, and Kick questionnaire    Fear of Current or Ex-Partner: No    Emotionally Abused: No    Physically Abused: No    Sexually Abused: No  Depression (PHQ2-9): Not on file  Alcohol  Screen: Not on file  Housing: Low Risk (03/17/2024)   Received from [facility]   Epic    What is your living situation today?: I have a steady place to live    Think about the place you live. Do you have problems with any of the following? Choose all that apply:: None/None on this [name]  Utilities: Low Risk (03/17/2024)   Received from [facility]   Utilities    In the past 12 months has the electric, gas, oil, or water  company threatened to shut off services in your home? : No  Health Literacy: Not on file    Family History  Problem Relation Age of Onset   Breast cancer Maternal Aunt        70/80s   Cancer Maternal Aunt  unsure if uterine or ovarian    Allergies[1]  Show/hide medication [name][2]  Review of Systems  Constitutional: Negative.   HENT: Negative.    Eyes: Negative.   Respiratory: Negative.  Negative for shortness of breath.   Cardiovascular: Negative.  Negative for chest pain.  Gastrointestinal: Negative.  Negative for abdominal pain, constipation and diarrhea.  Genitourinary: Negative.   Musculoskeletal:  Negative for joint pain and myalgias.  Skin: Negative.   Neurological: Negative.  Negative for dizziness and headaches.  Endo/Heme/Allergies: Negative.   All other systems reviewed and are negative.      Objective:   BP (!) 92/58   Pulse 70   Ht 5' 5 (1.651 m)   Wt 163 lb (73.9 kg)   SpO2 98%   BMI 27.12 kg/m   Vitals:   02/04/25 1442  BP: (!) 92/58  Pulse: 70  Height: 5' 5 (1.651 m)  Weight: 163 lb (73.9 kg)  SpO2: 98%  BMI (Calculated): 27.12    Physical Exam Vitals and nursing note reviewed.  Constitutional:      Appearance: Normal appearance. She is normal weight.  HENT:     Head: Normocephalic and atraumatic.     Nose: Nose normal.     Mouth/Throat:     Mouth: Mucous membranes are moist.  Eyes:     Extraocular Movements: Extraocular movements intact.     Conjunctiva/sclera: Conjunctivae normal.     Pupils: Pupils  are equal, round, and reactive to light.  Cardiovascular:     Rate and Rhythm: Normal rate and regular rhythm.     Pulses: Normal pulses.     Heart sounds: Normal heart sounds.  Pulmonary:     Effort: Pulmonary effort is normal.     Breath sounds: Normal breath sounds.  Abdominal:     General: Abdomen is flat. Bowel sounds are normal.     Palpations: Abdomen is soft.  Musculoskeletal:        General: Normal range of motion.     Cervical back: Normal range of motion.  Skin:    General: Skin is warm and dry.  Neurological:     General: No focal deficit present.     Mental Status: She is alert and oriented to person, place, and time.  Psychiatric:        Mood and Affect: Mood normal.        Behavior: Behavior normal.        Thought Content: Thought content normal.        Judgment: Judgment normal.      No results found for any visits on 02/04/25.  Recent Results (from the past 2160 hours)  Hepatitis B core antibody, total     Status: None   Collection Time: 11/30/24  2:59 PM  Result Value Ref Range   Hep B Core Total Ab Negative Negative  Hepatitis B surface antibody,qualitative     Status: None   Collection Time: 11/30/24  2:59 PM  Result Value Ref Range   Hep B Surface Ab, Qual Non Reactive     Comment:          Non Reactive: Not immune to HBV infection.           Anti-HBs undetectable or less than 10 mIU/mL.          Reactive: Evidence of HBV immunity.           Anti-HBs levels greater than 10 mIU/mL.   Hepatitis B surface antigen  Status: None   Collection Time: 11/30/24  2:59 PM  Result Value Ref Range   Hepatitis B Surface Ag Negative Negative  Hepatitis C antibody     Status: None   Collection Time: 11/30/24  2:59 PM  Result Value Ref Range   Hep C Virus Ab Non Reactive Non Reactive    Comment: HCV antibody alone does not differentiate between previously resolved infection and active infection. Equivocal and Reactive HCV antibody results should be followed  up with an HCV RNA test to support the diagnosis of active HCV infection.   ALT     Status: Abnormal   Collection Time: 11/30/24  2:59 PM  Result Value Ref Range   ALT 43 (H) 0 - 32 IU/L  AST     Status: None   Collection Time: 11/30/24  2:59 PM  Result Value Ref Range   AST 31 0 - 40 IU/L  BUN     Status: None   Collection Time: 11/30/24  2:59 PM  Result Value Ref Range   BUN 13 6 - 24 mg/dL  Creatinine with Est GFR     Status: None   Collection Time: 11/30/24  2:59 PM  Result Value Ref Range   Creatinine, Ser 0.85 0.57 - 1.00 mg/dL   eGFR 80 >40 fO/fpw/8.26  HIV Antibody (routine testing w rflx)     Status: None   Collection Time: 11/30/24  2:59 PM  Result Value Ref Range   HIV Screen 4th Generation wRfx Non Reactive Non Reactive    Comment: HIV-1/HIV-2 antibodies and HIV-1 p24 antigen were NOT detected. There is no laboratory evidence of HIV infection. HIV Negative   CBC with Differential/Platelet     Status: Abnormal   Collection Time: 11/30/24  2:59 PM  Result Value Ref Range   WBC 8.5 3.4 - 10.8 x10E3/uL   RBC 4.21 3.77 - 5.28 x10E6/uL   Hemoglobin 13.2 11.1 - 15.9 g/dL   Hematocrit 57.9 65.9 - 46.6 %   MCV 100 (H) 79 - 97 fL   MCH 31.4 26.6 - 33.0 pg   MCHC 31.4 (L) 31.5 - 35.7 g/dL   RDW 86.9 88.2 - 84.5 %   Platelets 219 150 - 450 x10E3/uL   Neutrophils 76 Not Estab. %   Lymphs 15 Not Estab. %   Monocytes 7 Not Estab. %   Eos 1 Not Estab. %   Basos 1 Not Estab. %   Neutrophils Absolute 6.5 1.4 - 7.0 x10E3/uL   Lymphocytes Absolute 1.2 0.7 - 3.1 x10E3/uL   Monocytes Absolute 0.6 0.1 - 0.9 x10E3/uL   EOS (ABSOLUTE) 0.1 0.0 - 0.4 x10E3/uL   Basophils Absolute 0.1 0.0 - 0.2 x10E3/uL   Immature Granulocytes 0 Not Estab. %   Immature Grans (Abs) 0.0 0.0 - 0.1 x10E3/uL  QuantiFERON-TB Gold Plus     Status: None   Collection Time: 11/30/24  2:59 PM  Result Value Ref Range   QuantiFERON Incubation Incubation performed.    QuantiFERON Criteria Comment      Comment: QuantiFERON-TB Gold Plus is a qualitative indirect test for M tuberculosis infection (including disease) and is intended for use in conjunction with risk assessment, radiography, and other medical and diagnostic evaluations. The QuantiFERON-TB Gold Plus result is determined by subtracting the Nil value from either TB antigen (Ag) value. The Mitogen tube serves as a control for the test.    QuantiFERON TB1 Ag Value 0.01 IU/mL   QuantiFERON TB2 Ag Value 0.01 IU/mL  QuantiFERON Nil Value 0.02 IU/mL   QuantiFERON Mitogen Value >10.00 IU/mL   QuantiFERON-TB Gold Plus Negative Negative    Comment: No response to M tuberculosis antigens detected. Infection with M tuberculosis is unlikely, but high risk individuals should be considered for additional testing (ATS/IDSA/CDC Clinical Practice Guidelines, 2017). The reference range is an Antigen minus Nil result of <0.35 IU/mL. Chemiluminescence immunoassay methodology   Urinalysis     Status: Abnormal   Collection Time: 01/06/25 11:57 AM  Result Value Ref Range   Specific Gravity, UA 1.022 1.005 - 1.030   pH, UA 5.5 5.0 - 7.5   Color, UA Yellow Yellow   Appearance Ur Clear Clear   Leukocytes,UA Trace (A) Negative   Protein,UA Negative Negative/Trace   Glucose, UA Negative Negative   Ketones, UA Negative Negative   RBC, UA Negative Negative   Bilirubin, UA Negative Negative   Urobilinogen, Ur 0.2 0.2 - 1.0 mg/dL   Nitrite, UA Positive (A) Negative  POCT urinalysis dipstick     Status: Abnormal   Collection Time: 01/21/25  2:12 PM  Result Value Ref Range   Color, UA     Clarity, UA     Glucose, UA Negative Negative   Bilirubin, UA Positive    Ketones, UA Negative    Spec Grav, UA >=1.030 (A) 1.010 - 1.025   Blood, UA Negative    pH, UA 6.0 5.0 - 8.0   Protein, UA Negative Negative   Urobilinogen, UA 0.2 0.2 or 1.0 E.U./dL   Nitrite, UA Negative    Leukocytes, UA Negative Negative   Appearance     Odor    Urine Culture      Status: None   Collection Time: 01/21/25  2:48 PM   Specimen: Urine   UR  Result Value Ref Range   Urine Culture, Routine Final report    Organism ID, Bacteria Comment     Comment: Mixed urogenital flora 10,000-25,000 colony forming units per mL       Assessment & Plan:  Continue current medications.   Problem [name] Items Addressed This Visit       Other   Weight loss counseling, encounter for   Overweight - Primary    Return if symptoms worsen or fail to improve, for as scheduled.   Total time spent: 25 minutes. This time includes review of previous notes and results and patient face to face interaction during today's visit.    Jeoffrey Pollen, NP  02/04/2025   This document may have been prepared by [facility] Voice Recognition software and as such may include unintentional dictation errors.     [1]  Allergies Allergen Reactions   Azithromycin Diarrhea and Nausea And Vomiting   Benzalkonium Chloride Other (See Comments)    Pt unsure of allergy    Gabapentin  Other (See Comments)    Patient states it makes her loopy   Augmentin [Amoxicillin-Pot Clavulanate] Nausea Only   Ciprofloxacin Nausea Only   Neomycin  Rash   Neosporin [Bacitracin-Polymyxin B] Itching and Rash   Neosporin [Neomycin -Bacitracin Zn-Polymyx] Rash   Septra [Sulfamethoxazole-Trimethoprim] Nausea Only  [2]  Outpatient Medications Prior to Visit  Medication Sig   albuterol  (VENTOLIN  HFA) 108 (90 Base) MCG/ACT inhaler Inhale 2 puffs into the lungs every 6 (six) hours as needed for wheezing or shortness of breath.   clindamycin  (CLINDAGEL) 1 % gel Apply topically 2 (two) times daily.   HYDROcodone -acetaminophen  (NORCO/VICODIN) 5-325 MG tablet Take 1 tablet by mouth every 6 (six) hours as needed  for moderate pain (pain score 4-6).   levonorgestrel  (MIRENA ) 20 MCG/DAY IUD 1 each by Intrauterine route once.   methocarbamol  (ROBAXIN ) 500 MG tablet Take 1 tablet (500 mg total) by mouth 3 (three) times  daily as needed for muscle spasms.   ondansetron  (ZOFRAN ) 4 MG tablet TAKE 1 TABLET BY MOUTH EVERY 8 HOURS AS NEEDED FOR NAUSEA OR VOMITING   Secukinumab  (COSENTYX  UNOREADY) 300 MG/2ML SOAJ Inject 300 mg into the skin every 28 (twenty-eight) days. MAINTENANCE:  Starting week 8 - Inject 300 mg into the skin every 28 (twenty-eight) days.   Secukinumab  (COSENTYX  UNOREADY) 300 MG/2ML SOAJ Inject 300 mg into the skin once a week. LOADING:  Inject 300 mg into the skin once weekly at weeks 0,1,2,3,4   TRINTELLIX  20 MG TABS tablet TAKE 1 TABLET BY MOUTH ONCE DAILY   ACAI BERRY PO Take 1 capsule by mouth daily. (Patient not taking: Reported on 02/04/2025)   acidophilus (RISAQUAD) CAPS capsule Take 1 capsule by mouth daily. (Patient not taking: Reported on 02/04/2025)   Cholecalciferol  (VITAMIN D3 PO) Take 1 tablet by mouth daily. (Patient not taking: Reported on 02/04/2025)   docusate sodium  (COLACE) 100 MG capsule Take 1 capsule (100 mg total) by mouth 2 (two) times daily. (Patient not taking: Reported on 02/04/2025)   fluticasone  (FLONASE ) 50 MCG/ACT nasal spray Place 1 spray into both nostrils daily. (Patient not taking: Reported on 02/04/2025)   semaglutide -weight management (WEGOVY ) 0.25 MG/0.5ML SOAJ SQ injection Inject 0.25 mg into the skin once a week. (Patient not taking: Reported on 02/04/2025)   [DISCONTINUED] Omega-3 Fatty Acids (FISH OIL PO) Take 1 capsule by mouth daily. (Patient not taking: Reported on 02/04/2025)   [DISCONTINUED] spironolactone  (ALDACTONE ) 25 MG tablet Take 1 tablet (25 mg total) by mouth daily. (Patient not taking: Reported on 02/04/2025)   No facility-administered medications prior to visit.   "

## 2025-02-16 ENCOUNTER — Ambulatory Visit

## 2025-03-08 ENCOUNTER — Ambulatory Visit: Admitting: Cardiology
